# Patient Record
Sex: Female | Born: 1960
Health system: Southern US, Community
[De-identification: ages and names within clinical notes are randomized; demographics above are authoritative.]

## PROBLEM LIST (undated history)

## (undated) DIAGNOSIS — M1990 Unspecified osteoarthritis, unspecified site: Secondary | ICD-10-CM

## (undated) DIAGNOSIS — F1010 Alcohol abuse, uncomplicated: Secondary | ICD-10-CM

## (undated) DIAGNOSIS — E7800 Pure hypercholesterolemia, unspecified: Secondary | ICD-10-CM

## (undated) DIAGNOSIS — F32A Depression, unspecified: Secondary | ICD-10-CM

## (undated) DIAGNOSIS — G5600 Carpal tunnel syndrome, unspecified upper limb: Secondary | ICD-10-CM

## (undated) DIAGNOSIS — M797 Fibromyalgia: Secondary | ICD-10-CM

## (undated) DIAGNOSIS — J45909 Unspecified asthma, uncomplicated: Secondary | ICD-10-CM

## (undated) DIAGNOSIS — F329 Major depressive disorder, single episode, unspecified: Secondary | ICD-10-CM

## (undated) HISTORY — DX: Alcohol abuse, uncomplicated: F10.10

## (undated) HISTORY — DX: Fibromyalgia: M79.7

## (undated) HISTORY — PX: FOOT SURGERY: SHX648

## (undated) HISTORY — PX: COLONOSCOPY: SHX5424

## (undated) HISTORY — DX: Depression, unspecified: F32.A

## (undated) HISTORY — DX: Major depressive disorder, single episode, unspecified: F32.9

## (undated) HISTORY — DX: Carpal tunnel syndrome, unspecified upper limb: G56.00

## (undated) HISTORY — DX: Unspecified osteoarthritis, unspecified site: M19.90

## (undated) HISTORY — PX: KNEE SURGERY: SHX244

---

## 1999-01-02 ENCOUNTER — Emergency Department (HOSPITAL_COMMUNITY): Admission: EM | Admit: 1999-01-02 | Discharge: 1999-01-02 | Payer: Self-pay | Admitting: Emergency Medicine

## 1999-12-31 ENCOUNTER — Ambulatory Visit (HOSPITAL_COMMUNITY): Admission: RE | Admit: 1999-12-31 | Discharge: 1999-12-31 | Payer: Self-pay | Admitting: *Deleted

## 2000-06-09 ENCOUNTER — Ambulatory Visit (HOSPITAL_COMMUNITY): Admission: RE | Admit: 2000-06-09 | Discharge: 2000-06-09 | Payer: Self-pay | Admitting: *Deleted

## 2000-10-31 ENCOUNTER — Emergency Department (HOSPITAL_COMMUNITY): Admission: EM | Admit: 2000-10-31 | Discharge: 2000-10-31 | Payer: Self-pay | Admitting: Emergency Medicine

## 2000-10-31 ENCOUNTER — Encounter: Payer: Self-pay | Admitting: Emergency Medicine

## 2001-03-30 ENCOUNTER — Encounter: Payer: Self-pay | Admitting: Family Medicine

## 2001-03-30 ENCOUNTER — Encounter: Admission: RE | Admit: 2001-03-30 | Discharge: 2001-03-30 | Payer: Self-pay | Admitting: Family Medicine

## 2001-05-22 ENCOUNTER — Emergency Department (HOSPITAL_COMMUNITY): Admission: EM | Admit: 2001-05-22 | Discharge: 2001-05-22 | Payer: Self-pay | Admitting: Emergency Medicine

## 2002-03-11 ENCOUNTER — Emergency Department (HOSPITAL_COMMUNITY): Admission: EM | Admit: 2002-03-11 | Discharge: 2002-03-11 | Payer: Self-pay | Admitting: Emergency Medicine

## 2002-10-13 ENCOUNTER — Emergency Department (HOSPITAL_COMMUNITY): Admission: AD | Admit: 2002-10-13 | Discharge: 2002-10-13 | Payer: Self-pay | Admitting: Emergency Medicine

## 2002-10-13 ENCOUNTER — Encounter: Payer: Self-pay | Admitting: Emergency Medicine

## 2002-10-18 ENCOUNTER — Encounter: Admission: RE | Admit: 2002-10-18 | Discharge: 2002-10-18 | Payer: Self-pay | Admitting: Internal Medicine

## 2002-12-16 ENCOUNTER — Encounter: Admission: RE | Admit: 2002-12-16 | Discharge: 2002-12-16 | Payer: Self-pay | Admitting: Internal Medicine

## 2002-12-27 ENCOUNTER — Ambulatory Visit (HOSPITAL_COMMUNITY): Admission: RE | Admit: 2002-12-27 | Discharge: 2002-12-27 | Payer: Self-pay | Admitting: Internal Medicine

## 2003-01-03 ENCOUNTER — Encounter: Admission: RE | Admit: 2003-01-03 | Discharge: 2003-01-03 | Payer: Self-pay | Admitting: Internal Medicine

## 2003-01-03 ENCOUNTER — Encounter: Payer: Self-pay | Admitting: Internal Medicine

## 2003-02-16 ENCOUNTER — Encounter: Admission: RE | Admit: 2003-02-16 | Discharge: 2003-02-16 | Payer: Self-pay | Admitting: Internal Medicine

## 2003-02-18 ENCOUNTER — Emergency Department (HOSPITAL_COMMUNITY): Admission: EM | Admit: 2003-02-18 | Discharge: 2003-02-18 | Payer: Self-pay | Admitting: Emergency Medicine

## 2003-05-18 ENCOUNTER — Encounter: Admission: RE | Admit: 2003-05-18 | Discharge: 2003-05-18 | Payer: Self-pay | Admitting: Internal Medicine

## 2003-05-31 ENCOUNTER — Emergency Department (HOSPITAL_COMMUNITY): Admission: EM | Admit: 2003-05-31 | Discharge: 2003-05-31 | Payer: Self-pay | Admitting: Emergency Medicine

## 2003-07-27 ENCOUNTER — Encounter: Admission: RE | Admit: 2003-07-27 | Discharge: 2003-07-27 | Payer: Self-pay | Admitting: Internal Medicine

## 2003-08-24 ENCOUNTER — Emergency Department (HOSPITAL_COMMUNITY): Admission: AD | Admit: 2003-08-24 | Discharge: 2003-08-24 | Payer: Self-pay | Admitting: Family Medicine

## 2003-11-02 ENCOUNTER — Encounter: Admission: RE | Admit: 2003-11-02 | Discharge: 2003-11-02 | Payer: Self-pay | Admitting: Internal Medicine

## 2004-02-03 ENCOUNTER — Ambulatory Visit: Payer: Self-pay | Admitting: Internal Medicine

## 2004-02-09 ENCOUNTER — Ambulatory Visit (HOSPITAL_COMMUNITY): Admission: RE | Admit: 2004-02-09 | Discharge: 2004-02-09 | Payer: Self-pay | Admitting: Internal Medicine

## 2004-03-16 ENCOUNTER — Ambulatory Visit: Payer: Self-pay | Admitting: Internal Medicine

## 2004-08-03 ENCOUNTER — Ambulatory Visit: Payer: Self-pay | Admitting: Internal Medicine

## 2004-11-01 ENCOUNTER — Ambulatory Visit: Payer: Self-pay | Admitting: Internal Medicine

## 2004-12-06 ENCOUNTER — Ambulatory Visit: Payer: Self-pay | Admitting: Internal Medicine

## 2006-06-03 HISTORY — PX: ABDOMINAL HYSTERECTOMY: SHX81

## 2006-07-19 ENCOUNTER — Emergency Department (HOSPITAL_COMMUNITY): Admission: EM | Admit: 2006-07-19 | Discharge: 2006-07-20 | Payer: Self-pay | Admitting: Emergency Medicine

## 2006-11-06 ENCOUNTER — Encounter (INDEPENDENT_AMBULATORY_CARE_PROVIDER_SITE_OTHER): Payer: Self-pay | Admitting: *Deleted

## 2006-11-06 ENCOUNTER — Ambulatory Visit: Payer: Self-pay | Admitting: Internal Medicine

## 2006-11-06 DIAGNOSIS — N898 Other specified noninflammatory disorders of vagina: Secondary | ICD-10-CM | POA: Insufficient documentation

## 2006-11-06 DIAGNOSIS — F332 Major depressive disorder, recurrent severe without psychotic features: Secondary | ICD-10-CM | POA: Insufficient documentation

## 2006-11-06 DIAGNOSIS — F1010 Alcohol abuse, uncomplicated: Secondary | ICD-10-CM | POA: Insufficient documentation

## 2006-11-06 DIAGNOSIS — F329 Major depressive disorder, single episode, unspecified: Secondary | ICD-10-CM

## 2006-11-06 DIAGNOSIS — IMO0002 Reserved for concepts with insufficient information to code with codable children: Secondary | ICD-10-CM | POA: Insufficient documentation

## 2006-11-06 DIAGNOSIS — E785 Hyperlipidemia, unspecified: Secondary | ICD-10-CM | POA: Insufficient documentation

## 2006-11-06 DIAGNOSIS — F32A Depression, unspecified: Secondary | ICD-10-CM | POA: Insufficient documentation

## 2006-11-06 DIAGNOSIS — A64 Unspecified sexually transmitted disease: Secondary | ICD-10-CM | POA: Insufficient documentation

## 2006-11-06 DIAGNOSIS — IMO0001 Reserved for inherently not codable concepts without codable children: Secondary | ICD-10-CM | POA: Insufficient documentation

## 2006-11-06 DIAGNOSIS — D259 Leiomyoma of uterus, unspecified: Secondary | ICD-10-CM | POA: Insufficient documentation

## 2006-11-06 LAB — CONVERTED CEMR LAB: GC Probe Amp, Genital: NEGATIVE

## 2006-11-07 ENCOUNTER — Encounter (INDEPENDENT_AMBULATORY_CARE_PROVIDER_SITE_OTHER): Payer: Self-pay | Admitting: *Deleted

## 2006-11-07 LAB — CONVERTED CEMR LAB
Candida species: NEGATIVE
Gardnerella vaginalis: POSITIVE — AB
Trichomonal Vaginitis: NEGATIVE

## 2006-11-19 ENCOUNTER — Telehealth: Payer: Self-pay

## 2006-12-22 DIAGNOSIS — M25512 Pain in left shoulder: Secondary | ICD-10-CM | POA: Insufficient documentation

## 2007-01-22 ENCOUNTER — Ambulatory Visit (HOSPITAL_COMMUNITY): Admission: RE | Admit: 2007-01-22 | Discharge: 2007-01-22 | Payer: Self-pay | Admitting: Internal Medicine

## 2007-01-22 ENCOUNTER — Encounter (INDEPENDENT_AMBULATORY_CARE_PROVIDER_SITE_OTHER): Payer: Self-pay | Admitting: *Deleted

## 2007-01-22 ENCOUNTER — Ambulatory Visit: Payer: Self-pay | Admitting: *Deleted

## 2007-02-26 ENCOUNTER — Encounter (INDEPENDENT_AMBULATORY_CARE_PROVIDER_SITE_OTHER): Payer: Self-pay | Admitting: *Deleted

## 2007-02-26 ENCOUNTER — Ambulatory Visit: Payer: Self-pay | Admitting: Internal Medicine

## 2007-02-27 ENCOUNTER — Ambulatory Visit (HOSPITAL_COMMUNITY): Admission: RE | Admit: 2007-02-27 | Discharge: 2007-02-27 | Payer: Self-pay | Admitting: Internal Medicine

## 2007-03-18 ENCOUNTER — Encounter: Admission: RE | Admit: 2007-03-18 | Discharge: 2007-06-16 | Payer: Self-pay | Admitting: Internal Medicine

## 2007-03-18 ENCOUNTER — Encounter (INDEPENDENT_AMBULATORY_CARE_PROVIDER_SITE_OTHER): Payer: Self-pay | Admitting: *Deleted

## 2007-03-31 ENCOUNTER — Encounter (INDEPENDENT_AMBULATORY_CARE_PROVIDER_SITE_OTHER): Payer: Self-pay | Admitting: *Deleted

## 2007-04-02 ENCOUNTER — Ambulatory Visit: Payer: Self-pay | Admitting: Internal Medicine

## 2007-04-02 ENCOUNTER — Encounter (INDEPENDENT_AMBULATORY_CARE_PROVIDER_SITE_OTHER): Payer: Self-pay | Admitting: *Deleted

## 2007-04-02 LAB — CONVERTED CEMR LAB
BUN: 14 mg/dL (ref 6–23)
Calcium: 9.5 mg/dL (ref 8.4–10.5)
Glucose, Bld: 80 mg/dL (ref 70–99)
Sodium: 139 meq/L (ref 135–145)
TSH: 1.106 microintl units/mL (ref 0.350–5.50)

## 2007-04-08 ENCOUNTER — Encounter (INDEPENDENT_AMBULATORY_CARE_PROVIDER_SITE_OTHER): Payer: Self-pay | Admitting: *Deleted

## 2007-04-08 ENCOUNTER — Ambulatory Visit: Payer: Self-pay | Admitting: Internal Medicine

## 2007-04-08 LAB — CONVERTED CEMR LAB
Cholesterol: 265 mg/dL — ABNORMAL HIGH (ref 0–200)
Triglycerides: 109 mg/dL (ref <150)
VLDL: 22 mg/dL (ref 0–40)

## 2007-04-17 ENCOUNTER — Ambulatory Visit: Payer: Self-pay | Admitting: Internal Medicine

## 2007-05-12 ENCOUNTER — Encounter: Payer: Self-pay | Admitting: *Deleted

## 2007-05-18 ENCOUNTER — Encounter (INDEPENDENT_AMBULATORY_CARE_PROVIDER_SITE_OTHER): Payer: Self-pay | Admitting: *Deleted

## 2007-05-18 ENCOUNTER — Ambulatory Visit: Payer: Self-pay | Admitting: Internal Medicine

## 2007-05-18 LAB — CONVERTED CEMR LAB
Chlamydia, DNA Probe: NEGATIVE
GC Probe Amp, Genital: NEGATIVE

## 2007-05-19 ENCOUNTER — Encounter (INDEPENDENT_AMBULATORY_CARE_PROVIDER_SITE_OTHER): Payer: Self-pay | Admitting: *Deleted

## 2007-05-19 ENCOUNTER — Telehealth: Payer: Self-pay | Admitting: Licensed Clinical Social Worker

## 2007-05-21 LAB — CONVERTED CEMR LAB: Candida species: NEGATIVE

## 2007-06-10 ENCOUNTER — Encounter: Payer: Self-pay | Admitting: Licensed Clinical Social Worker

## 2007-06-12 ENCOUNTER — Encounter: Payer: Self-pay | Admitting: Licensed Clinical Social Worker

## 2007-06-16 ENCOUNTER — Encounter: Payer: Self-pay | Admitting: Licensed Clinical Social Worker

## 2007-06-26 ENCOUNTER — Telehealth: Payer: Self-pay | Admitting: Licensed Clinical Social Worker

## 2007-07-14 ENCOUNTER — Encounter (INDEPENDENT_AMBULATORY_CARE_PROVIDER_SITE_OTHER): Payer: Self-pay | Admitting: *Deleted

## 2007-07-14 ENCOUNTER — Encounter: Admission: RE | Admit: 2007-07-14 | Discharge: 2007-08-19 | Payer: Self-pay | Admitting: *Deleted

## 2007-07-27 ENCOUNTER — Encounter (INDEPENDENT_AMBULATORY_CARE_PROVIDER_SITE_OTHER): Payer: Self-pay | Admitting: *Deleted

## 2007-08-03 ENCOUNTER — Telehealth: Payer: Self-pay | Admitting: *Deleted

## 2007-09-21 ENCOUNTER — Ambulatory Visit: Payer: Self-pay | Admitting: Internal Medicine

## 2007-09-21 ENCOUNTER — Encounter (INDEPENDENT_AMBULATORY_CARE_PROVIDER_SITE_OTHER): Payer: Self-pay | Admitting: *Deleted

## 2007-09-21 LAB — CONVERTED CEMR LAB
Chlamydia, DNA Probe: NEGATIVE
GC Probe Amp, Genital: NEGATIVE

## 2007-09-22 LAB — CONVERTED CEMR LAB

## 2007-09-24 ENCOUNTER — Telehealth (INDEPENDENT_AMBULATORY_CARE_PROVIDER_SITE_OTHER): Payer: Self-pay | Admitting: *Deleted

## 2007-09-24 LAB — CONVERTED CEMR LAB
Candida species: POSITIVE — AB
Gardnerella vaginalis: POSITIVE — AB

## 2007-12-23 ENCOUNTER — Ambulatory Visit (HOSPITAL_COMMUNITY): Admission: RE | Admit: 2007-12-23 | Discharge: 2007-12-23 | Payer: Self-pay | Admitting: Internal Medicine

## 2007-12-23 ENCOUNTER — Encounter (INDEPENDENT_AMBULATORY_CARE_PROVIDER_SITE_OTHER): Payer: Self-pay | Admitting: Internal Medicine

## 2007-12-23 ENCOUNTER — Ambulatory Visit: Payer: Self-pay | Admitting: Internal Medicine

## 2007-12-23 DIAGNOSIS — M542 Cervicalgia: Secondary | ICD-10-CM | POA: Insufficient documentation

## 2007-12-23 LAB — CONVERTED CEMR LAB
ALT: 20 units/L (ref 0–35)
CO2: 20 meq/L (ref 19–32)
Calcium: 9.1 mg/dL (ref 8.4–10.5)
Sodium: 140 meq/L (ref 135–145)
Total Protein: 7.3 g/dL (ref 6.0–8.3)

## 2008-01-06 ENCOUNTER — Ambulatory Visit: Payer: Self-pay | Admitting: *Deleted

## 2008-01-06 ENCOUNTER — Ambulatory Visit (HOSPITAL_COMMUNITY): Admission: RE | Admit: 2008-01-06 | Discharge: 2008-01-06 | Payer: Self-pay | Admitting: Internal Medicine

## 2008-01-06 DIAGNOSIS — M25569 Pain in unspecified knee: Secondary | ICD-10-CM | POA: Insufficient documentation

## 2008-01-06 DIAGNOSIS — M25579 Pain in unspecified ankle and joints of unspecified foot: Secondary | ICD-10-CM | POA: Insufficient documentation

## 2008-01-11 ENCOUNTER — Ambulatory Visit (HOSPITAL_COMMUNITY): Admission: RE | Admit: 2008-01-11 | Discharge: 2008-01-11 | Payer: Self-pay | Admitting: Internal Medicine

## 2008-02-05 ENCOUNTER — Ambulatory Visit: Payer: Self-pay | Admitting: Internal Medicine

## 2008-02-05 ENCOUNTER — Encounter (INDEPENDENT_AMBULATORY_CARE_PROVIDER_SITE_OTHER): Payer: Self-pay | Admitting: Internal Medicine

## 2008-02-25 ENCOUNTER — Ambulatory Visit (HOSPITAL_COMMUNITY): Admission: RE | Admit: 2008-02-25 | Discharge: 2008-02-25 | Payer: Self-pay | Admitting: Internal Medicine

## 2008-03-16 ENCOUNTER — Encounter (INDEPENDENT_AMBULATORY_CARE_PROVIDER_SITE_OTHER): Payer: Self-pay | Admitting: Internal Medicine

## 2008-03-16 ENCOUNTER — Ambulatory Visit: Payer: Self-pay | Admitting: Infectious Disease

## 2008-03-16 DIAGNOSIS — M79609 Pain in unspecified limb: Secondary | ICD-10-CM | POA: Insufficient documentation

## 2008-03-16 LAB — CONVERTED CEMR LAB
ALT: 21 units/L (ref 0–35)
Alkaline Phosphatase: 88 units/L (ref 39–117)
BUN: 17 mg/dL (ref 6–23)
Basophils Absolute: 0 10*3/uL (ref 0.0–0.1)
Calcium: 9.9 mg/dL (ref 8.4–10.5)
Creatinine, Ser: 0.86 mg/dL (ref 0.40–1.20)
Eosinophils Absolute: 0.2 10*3/uL (ref 0.0–0.7)
Glucose, Bld: 92 mg/dL (ref 70–99)
LDL Cholesterol: 163 mg/dL — ABNORMAL HIGH (ref 0–99)
Lymphs Abs: 4 10*3/uL (ref 0.7–4.0)
MCHC: 33.2 g/dL (ref 30.0–36.0)
Monocytes Relative: 8 % (ref 3–12)
Neutrophils Relative %: 52 % (ref 43–77)
Potassium: 4.3 meq/L (ref 3.5–5.3)
RBC: 4.19 M/uL (ref 3.87–5.11)
RDW: 13.6 % (ref 11.5–15.5)
Sodium: 139 meq/L (ref 135–145)
Total Protein: 7.6 g/dL (ref 6.0–8.3)
VLDL: 41 mg/dL — ABNORMAL HIGH (ref 0–40)
WBC: 10.5 10*3/uL (ref 4.0–10.5)

## 2008-03-21 ENCOUNTER — Encounter (INDEPENDENT_AMBULATORY_CARE_PROVIDER_SITE_OTHER): Payer: Self-pay | Admitting: Internal Medicine

## 2008-04-01 ENCOUNTER — Telehealth (INDEPENDENT_AMBULATORY_CARE_PROVIDER_SITE_OTHER): Payer: Self-pay | Admitting: Internal Medicine

## 2008-04-20 ENCOUNTER — Ambulatory Visit: Payer: Self-pay | Admitting: Infectious Diseases

## 2008-04-20 ENCOUNTER — Encounter: Payer: Self-pay | Admitting: Internal Medicine

## 2008-04-20 DIAGNOSIS — R209 Unspecified disturbances of skin sensation: Secondary | ICD-10-CM | POA: Insufficient documentation

## 2008-04-20 LAB — CONVERTED CEMR LAB: Vitamin B-12: 442 pg/mL (ref 211–911)

## 2008-06-07 ENCOUNTER — Ambulatory Visit: Payer: Self-pay | Admitting: Infectious Disease

## 2008-06-07 ENCOUNTER — Encounter (INDEPENDENT_AMBULATORY_CARE_PROVIDER_SITE_OTHER): Payer: Self-pay | Admitting: Internal Medicine

## 2008-06-07 DIAGNOSIS — L84 Corns and callosities: Secondary | ICD-10-CM | POA: Insufficient documentation

## 2008-06-07 DIAGNOSIS — R519 Headache, unspecified: Secondary | ICD-10-CM | POA: Insufficient documentation

## 2008-06-07 DIAGNOSIS — R51 Headache: Secondary | ICD-10-CM | POA: Insufficient documentation

## 2008-06-07 HISTORY — DX: Corns and callosities: L84

## 2008-06-15 ENCOUNTER — Telehealth: Payer: Self-pay | Admitting: Internal Medicine

## 2008-07-24 ENCOUNTER — Emergency Department (HOSPITAL_COMMUNITY): Admission: EM | Admit: 2008-07-24 | Discharge: 2008-07-24 | Payer: Self-pay | Admitting: Emergency Medicine

## 2008-10-06 ENCOUNTER — Ambulatory Visit: Payer: Self-pay | Admitting: Internal Medicine

## 2008-11-10 ENCOUNTER — Emergency Department (HOSPITAL_COMMUNITY): Admission: EM | Admit: 2008-11-10 | Discharge: 2008-11-11 | Payer: Self-pay | Admitting: Emergency Medicine

## 2008-12-15 ENCOUNTER — Ambulatory Visit: Payer: Self-pay | Admitting: Internal Medicine

## 2008-12-15 ENCOUNTER — Encounter: Payer: Self-pay | Admitting: Internal Medicine

## 2008-12-15 DIAGNOSIS — R059 Cough, unspecified: Secondary | ICD-10-CM | POA: Insufficient documentation

## 2008-12-15 DIAGNOSIS — R05 Cough: Secondary | ICD-10-CM

## 2008-12-16 ENCOUNTER — Ambulatory Visit (HOSPITAL_COMMUNITY): Admission: RE | Admit: 2008-12-16 | Discharge: 2008-12-16 | Payer: Self-pay | Admitting: Internal Medicine

## 2008-12-16 LAB — CONVERTED CEMR LAB
Candida species: NEGATIVE
Gardnerella vaginalis: POSITIVE — AB

## 2009-01-02 ENCOUNTER — Encounter: Payer: Self-pay | Admitting: Internal Medicine

## 2009-01-09 ENCOUNTER — Encounter: Payer: Self-pay | Admitting: Internal Medicine

## 2009-01-26 ENCOUNTER — Ambulatory Visit: Payer: Self-pay | Admitting: Internal Medicine

## 2009-02-07 ENCOUNTER — Encounter: Payer: Self-pay | Admitting: Internal Medicine

## 2009-02-24 ENCOUNTER — Ambulatory Visit: Payer: Self-pay | Admitting: Infectious Diseases

## 2009-02-24 ENCOUNTER — Encounter: Payer: Self-pay | Admitting: Internal Medicine

## 2009-02-24 DIAGNOSIS — R079 Chest pain, unspecified: Secondary | ICD-10-CM | POA: Insufficient documentation

## 2009-03-17 ENCOUNTER — Ambulatory Visit: Payer: Self-pay | Admitting: Internal Medicine

## 2009-03-31 ENCOUNTER — Ambulatory Visit: Payer: Self-pay

## 2009-03-31 ENCOUNTER — Encounter: Payer: Self-pay | Admitting: Internal Medicine

## 2009-03-31 ENCOUNTER — Ambulatory Visit: Payer: Self-pay | Admitting: Cardiovascular Disease

## 2009-03-31 ENCOUNTER — Ambulatory Visit (HOSPITAL_COMMUNITY): Admission: RE | Admit: 2009-03-31 | Discharge: 2009-03-31 | Payer: Self-pay | Admitting: Internal Medicine

## 2009-04-14 ENCOUNTER — Ambulatory Visit: Payer: Self-pay | Admitting: Internal Medicine

## 2009-04-14 ENCOUNTER — Encounter: Payer: Self-pay | Admitting: Licensed Clinical Social Worker

## 2009-04-16 LAB — CONVERTED CEMR LAB
AST: 22 units/L (ref 0–37)
Albumin: 4.7 g/dL (ref 3.5–5.2)
Bilirubin, Direct: 0.1 mg/dL (ref 0.0–0.3)
HCT: 42.2 % (ref 36.0–46.0)
Indirect Bilirubin: 0.3 mg/dL (ref 0.0–0.9)
LDL Cholesterol: 160 mg/dL — ABNORMAL HIGH (ref 0–99)
Platelets: 369 10*3/uL (ref 150–400)
Total Bilirubin: 0.4 mg/dL (ref 0.3–1.2)
VLDL: 20 mg/dL (ref 0–40)
WBC: 11.6 10*3/uL — ABNORMAL HIGH (ref 4.0–10.5)

## 2009-05-01 ENCOUNTER — Encounter: Payer: Self-pay | Admitting: Internal Medicine

## 2009-05-02 ENCOUNTER — Ambulatory Visit (HOSPITAL_COMMUNITY): Admission: RE | Admit: 2009-05-02 | Discharge: 2009-05-02 | Payer: Self-pay | Admitting: Oncology

## 2009-05-15 ENCOUNTER — Ambulatory Visit: Payer: Self-pay | Admitting: Internal Medicine

## 2009-05-15 LAB — CONVERTED CEMR LAB
BUN: 15 mg/dL (ref 6–23)
CO2: 23 meq/L (ref 19–32)
Calcium: 9.8 mg/dL (ref 8.4–10.5)
Cholesterol: 222 mg/dL — ABNORMAL HIGH (ref 0–200)
Glucose, Bld: 101 mg/dL — ABNORMAL HIGH (ref 70–99)
HDL: 42 mg/dL (ref >39)
LDL Cholesterol: 159 mg/dL — ABNORMAL HIGH (ref 0–99)
Potassium: 4.3 meq/L (ref 3.5–5.3)
Total CHOL/HDL Ratio: 5.3
Triglycerides: 106 mg/dL (ref <150)
VLDL: 21 mg/dL (ref 0–40)

## 2009-06-12 ENCOUNTER — Encounter: Payer: Self-pay | Admitting: Internal Medicine

## 2009-06-13 ENCOUNTER — Telehealth: Payer: Self-pay | Admitting: Internal Medicine

## 2009-09-07 ENCOUNTER — Ambulatory Visit: Payer: Self-pay | Admitting: Internal Medicine

## 2009-10-31 ENCOUNTER — Ambulatory Visit: Payer: Self-pay | Admitting: Internal Medicine

## 2009-10-31 DIAGNOSIS — R350 Frequency of micturition: Secondary | ICD-10-CM | POA: Insufficient documentation

## 2009-11-01 ENCOUNTER — Ambulatory Visit: Payer: Self-pay | Admitting: Internal Medicine

## 2009-11-01 ENCOUNTER — Encounter (INDEPENDENT_AMBULATORY_CARE_PROVIDER_SITE_OTHER): Payer: Self-pay | Admitting: Internal Medicine

## 2009-11-01 LAB — CONVERTED CEMR LAB
Albumin: 4.8 g/dL (ref 3.5–5.2)
Alkaline Phosphatase: 71 units/L (ref 39–117)
Bilirubin Urine: NEGATIVE
Ketones, ur: NEGATIVE mg/dL
Leukocytes, UA: NEGATIVE
Nitrite: NEGATIVE
Potassium: 4.3 meq/L (ref 3.5–5.3)
Protein, ur: NEGATIVE mg/dL
Triglycerides: 110 mg/dL (ref <150)

## 2009-11-08 ENCOUNTER — Encounter: Payer: Self-pay | Admitting: Internal Medicine

## 2009-11-13 ENCOUNTER — Encounter: Payer: Self-pay | Admitting: Internal Medicine

## 2009-11-20 ENCOUNTER — Emergency Department (HOSPITAL_COMMUNITY): Admission: EM | Admit: 2009-11-20 | Discharge: 2009-11-20 | Payer: Self-pay | Admitting: Emergency Medicine

## 2009-12-01 ENCOUNTER — Telehealth: Payer: Self-pay | Admitting: Internal Medicine

## 2010-01-30 ENCOUNTER — Encounter: Payer: Self-pay | Admitting: Licensed Clinical Social Worker

## 2010-01-30 ENCOUNTER — Ambulatory Visit: Payer: Self-pay | Admitting: Internal Medicine

## 2010-03-08 ENCOUNTER — Encounter
Admission: RE | Admit: 2010-03-08 | Discharge: 2010-03-13 | Payer: Self-pay | Admitting: Physical Medicine & Rehabilitation

## 2010-03-13 ENCOUNTER — Encounter: Payer: Self-pay | Admitting: Internal Medicine

## 2010-03-13 ENCOUNTER — Ambulatory Visit: Payer: Self-pay | Admitting: Physical Medicine & Rehabilitation

## 2010-03-29 ENCOUNTER — Encounter: Payer: Self-pay | Admitting: Internal Medicine

## 2010-03-30 ENCOUNTER — Telehealth: Payer: Self-pay | Admitting: *Deleted

## 2010-04-30 ENCOUNTER — Ambulatory Visit: Payer: Self-pay | Admitting: Internal Medicine

## 2010-04-30 LAB — CONVERTED CEMR LAB
Bilirubin Urine: NEGATIVE
Glucose, Urine, Semiquant: NEGATIVE
Urobilinogen, UA: 0.2
pH: 5.5

## 2010-05-13 ENCOUNTER — Emergency Department (HOSPITAL_COMMUNITY)
Admission: EM | Admit: 2010-05-13 | Discharge: 2010-05-13 | Payer: Self-pay | Source: Home / Self Care | Admitting: Emergency Medicine

## 2010-06-05 ENCOUNTER — Ambulatory Visit (HOSPITAL_COMMUNITY)
Admission: RE | Admit: 2010-06-05 | Discharge: 2010-06-05 | Payer: Self-pay | Source: Home / Self Care | Attending: Internal Medicine | Admitting: Internal Medicine

## 2010-06-05 LAB — HM MAMMOGRAPHY

## 2010-06-13 ENCOUNTER — Ambulatory Visit: Admission: RE | Admit: 2010-06-13 | Discharge: 2010-06-13 | Payer: Self-pay | Source: Home / Self Care

## 2010-06-13 DIAGNOSIS — G5600 Carpal tunnel syndrome, unspecified upper limb: Secondary | ICD-10-CM | POA: Insufficient documentation

## 2010-06-13 DIAGNOSIS — L0390 Cellulitis, unspecified: Secondary | ICD-10-CM

## 2010-06-13 DIAGNOSIS — L0291 Cutaneous abscess, unspecified: Secondary | ICD-10-CM | POA: Insufficient documentation

## 2010-06-13 LAB — CONVERTED CEMR LAB
Bilirubin Urine: NEGATIVE
Casts: NONE SEEN /lpf
Crystals: NONE SEEN
Squamous Epithelial / LPF: NONE SEEN /lpf
Urine Glucose: NEGATIVE mg/dL
pH: 6 (ref 5.0–8.0)

## 2010-07-03 NOTE — Assessment & Plan Note (Signed)
Summary: Soc. Work   Social Work Evaluation Date  01/30/2010 Patient name [facility]  Primary MD   : Clerance Lav MD Social Worker's name : Dorothe Pea MSW- LCSW  Home [phone]    Cell phone: .  Marland Kitchen     Alternate phone: . Marland Kitchen        Primary Reason for Referral:     Emergency Housing/Public Housing/Section 8 Comments [facility].  Patient is known to social work.  She reported that she recd Disability in Nov of $674 per month and now has Medicaid and Medicare.  She also receives foodstamps of $41.  She is currently moving out of her mother's home into low income housing which will be about $360 per month.  She was supposed to move in on the 19th of August but that date has come and gone.  The patient is very stressed out about the uncertainty of her housing.  Action taken by Social Work: Called housing at [phone] and left message to advocate for patient.  The patient home phone is [phone].   SW to follow with housing and the patient.    Appended Document: Soc. Work The patient called on 8/30 to let us know that she would be moving into her housing on 9/1.

## 2010-07-03 NOTE — Miscellaneous (Signed)
[facility]   Imported By: Margie Billet 11/14/2009 14:42:00  _____________________________________________________________________  External Attachment:    Type:   Image     Comment:   External Document

## 2010-07-03 NOTE — Assessment & Plan Note (Signed)
Summary: BAD HEADACHES/SB.   Vital Signs:  Patient profile:   50 year old female Height:      65 inches (165.10 cm) Weight:      172.0 pounds (78.18 kg) BMI:     28.73 Temp:     97.7 degrees F (36.50 degrees C) oral Pulse rate:   59 / minute BP sitting:   113 / 69  (right arm) Cuff size:   regular  Vitals Entered By: Theotis Barrio NT II (April 30, 2010 11:07 AM)  CC: ON GOING HEADACHE FOR ABOUT A MONTH Is Patient Diabetic? No Pain Assessment Patient in pain? yes     Location: HEAD/NACK OF NECK Intensity:     7-8 Type: sharp Onset of pain  FOR ABOUT A MONTH Nutritional Status BMI of 25 - 29 = overweight  Have you ever been in a relationship where you felt threatened, hurt or afraid?No   Does patient need assistance? Functional Status Self care Ambulation Normal   Primary Care Jabria Loos:  Clerance Lav MD  CC:  ON GOING HEADACHE FOR ABOUT A MONTH.  History of Present Illness: This is a 50 year old female with hx of depression and hyperlipidemia who presents because of severe headaches x 1 month. Pt was initially having daily headaches for 1 week, but this has now improved somewhat and pt is having them every other day.  Headache left sided, starts retroorbital then spreads to the left side of her face, this occasionally generalizes more diffusely across the entire head though this is only during particularly bad headaches. Pain is sharp, throbbing, and rated 10/10 in intensity at its worse.  Pt has been taking excedrin which helps improve her symptoms after about an hour.   Pt has experienced both photo and phonophobia.  Pt denies having ever been diagnosed with migraines.  Initially, pt was seeing scintillating scotomas but this has not been occuring lately.  Pt occasionally feels light headed when leaning over and states that the room appears to be spining, though this tends to be assoicated with her headache. Pt denies any other neurologic symptoms including weakness,  slurred speach, numbness, change in her vision.   Pt is also complaining of a 1 month hx of dysuria. Pt feels she is having difficulty emptying her bladder, she also experiences burning with urination and some hesitancy.  Pt states that last week she also noted a small amount of pink on her toilet tissue.  Pt denis any fevers, chills, sob, chest pain, back pain or weight change.   Preventive Screening-Counseling & Management  Alcohol-Tobacco     Alcohol type: Beer AT TIMES     Smoking Status: quit     Year Quit: 2005  Caffeine-Diet-Exercise     Does Patient Exercise: yes     Type of exercise: WALKING     Times/week: <3  Allergies: No Known Drug Allergies  Past History:  Past Medical History: Last updated: 11/06/2006 Depression Fibromyalgia Ongoing ETOH abuse H/o STD :Trichomonas 2005  Family History: Reviewed history from 11/06/2006 and no changes required. Father: deceased,unknown medical history Mother: Alive, DM, HTN, CVA @age  36 Siblings: 2 Brothers and 2 sisters:one sister has thyroid problems              DM, HTN predominant  Social History: Reviewed history from 11/06/2006 and no changes required. Lives in [place] with her mother.Total of 6-7 ppl under one roof. Currently unemployed. Used to work at [employer] in [place]. She moved 5/08 to [place] ETOH  use: weekend, 1-2 beers from Th-Sun:used to drink everyday Former Smoker Current Marijuana use H/o Cocaine use: Quit since 2001  Review of Systems       Negative as per HPI.  Physical Exam  General:  alert and well-developed.   Head:  normocephalic and atraumatic.   Eyes:  vision grossly intact, pupils equal, pupils round, and pupils reactive to light.   Ears:  R ear normal and L ear normal.   Nose:  no external deformity and no nasal discharge.   Mouth:  pharynx pink and moist.   Neck:  supple and full ROM.   Lungs:  normal respiratory effort, normal breath sounds, no crackles, and no wheezes.     Heart:  normal rate, regular rhythm, no murmur, no gallop, and no rub.   Abdomen:  soft, non-tender, normal bowel sounds, and no distention.   Pulses:  2+ radial pulses Extremities:  No edema.  Neurologic:  alert & oriented X3, cranial nerves II-XII intact, strength normal in all extremities, and sensation intact to light touch.   Skin:  NO rashes.    Impression & Recommendations:  Problem # 1:  HEADACHE (ICD-784.0) At this point the DDX would include tension headache vs. magrane with the latter appearing more likely given the inital symptoms of scintillating scotoma associated unilateral headache and phono/photophobia.  The pts symptoms have been improving with the use of excedrin, however, I did wite her a prescription for sumatriptan today to see if it would help more.  I told the patient that if she was unable to afford the triptan that she could continue to use the OTC excedrin.  I did warn the pts about the possibility of chronic daily headache with extended daily use of excedrin and she understands the risks.  Given the pts complaint off occasional dizzyness I also performed orthostatic vital signs which were negative.  Problem # 2:  ? of ACUTE CYSTITIS (ICD-595.0)  Urine dip stick demonstrated small leukocytes that were nitrite and heme negative.  At this point I will check a urine culture and treate emperically for uncomplicated  acute cystitis with 3 days of cipro.   Her updated medication list for this problem includes:    Ciprofloxacin Hcl 250 Mg Tabs (Ciprofloxacin hcl) .Marland Kitchen... Take 1 tablet by mouth two times a day for 3 days.  Orders: T-Urinalysis Dipstick only (16109UE) T-Culture, Urine ([id number])  Her updated medication list for this problem includes:    Ciprofloxacin Hcl 250 Mg Tabs (Ciprofloxacin hcl) .Marland Kitchen... Take 1 tablet by mouth two times a day for 3 days.  Problem # 3:  PREVENTIVE HEALTH CARE (ICD-V70.0) Pt has already recieved a flu shot but wishes to get a Tdap   today.   Orders: Tdap => 24yrs IM (47829)  Complete Medication List: 1)  Seroquel Xr 300 Mg Xr24h-tab (Quetiapine fumarate) .... Take 1 tablet by mouth at bedtime 2)  Simvastatin 40 Mg Tabs (Simvastatin) .... Take 1 tablet by mouth once a day 3)  Zoloft 25 Mg Tabs (Sertraline hcl) .... Take 1 tablet by mouth once a day 4)  Hydroxyzine Hcl 25 Mg Tabs (Hydroxyzine hcl) .... Two times a day as needed 5)  Neurontin 100 Mg Caps (Gabapentin) .... Take 2 capsule three times a day. 6)  Meloxicam 7.5 Mg Tabs (Meloxicam) .... Take 1 pill by mouth daily as needed for pain. 7)  Sumatriptan Succinate 25 Mg Tabs (Sumatriptan succinate) .... Take on tab at the onset your your headahce. 8)  Ciprofloxacin  Hcl 250 Mg Tabs (Ciprofloxacin hcl) .... Take 1 tablet by mouth two times a day for 3 days.  Other Orders: Admin 1st Vaccine ([id number])  Patient Instructions: 1)  I am giving you an antibiotic for a probable bladder infection.  If you to have improvement in your symptoms please call the clinic for another appointment.  I am also giving you a medication for your headache.  If it helps and you wish to continue using it.  Call us for refills.  2)  Please schedule a follow-up appointment in 2 months. Prescriptions: CIPROFLOXACIN HCL 250 MG TABS (CIPROFLOXACIN HCL) Take 1 tablet by mouth two times a day for 3 days.  #6 x 0   Entered and Authorized by:   Sinda Du MD   Signed by:   Sinda Du MD on 04/30/2010   Method used:   Print then Give to Patient   RxID:   [id number] SUMATRIPTAN SUCCINATE 25 MG TABS (SUMATRIPTAN SUCCINATE) Take on tab at the onset your your headahce.  #2 x 3   Entered and Authorized by:   Sinda Du MD   Signed by:   Sinda Du MD on 04/30/2010   Method used:   Print then Give to Patient   RxID:   [id number]    Orders Added: 1)  T-Urinalysis Dipstick only [[id number]] 2)  T-Culture, Urine [[id number]] 3)  Tdap => 65yrs IM [[id number]] 4)  Est. Patient Level III  [42595] 5)  Admin 1st Vaccine [[id number]]   Tetanus Vaccine (to be given today)   Tetanus Vaccine (to be given today) Process Orders Check Orders Results:     [facility]: Check successful Tests Sent for requisitioning (April 30, 2010 1:05 PM):     04/30/2010: [facility] -- T-Culture, Urine [[id number]] (signed)     Prevention & Chronic Care Immunizations   Influenza vaccine: Fluvax MCR  (01/30/2010)   Influenza vaccine deferral: Deferred  (05/15/2009)    Tetanus booster: 04/30/2010: Tdap   Td booster deferral: Deferred  (04/14/2009)    Pneumococcal vaccine: Not documented   Pneumococcal vaccine deferral: Not indicated  (02/24/2009)  Other Screening   Pap smear: NEGATIVE FOR INTRAEPITHELIAL LESIONS OR MALIGNANCY.  (12/15/2008)   Pap smear action/deferral: Deferred  (01/30/2010)   Pap smear due: 10/2008    Mammogram: ASSESSMENT: Negative - BI-RADS 1^MM DIGITAL SCREENING  (05/02/2009)   Mammogram action/deferral: Ordered  (04/14/2009)   Mammogram due: 04/03/2010   Smoking status: quit  (04/30/2010)  Lipids   Total Cholesterol: 260  (10/31/2009)   Lipid panel action/deferral: Lipid Panel ordered   LDL: 195  (10/31/2009)   LDL Direct: Not documented   HDL: 43  (10/31/2009)   Triglycerides: 110  (10/31/2009)    SGOT (AST): 18  (10/31/2009)   BMP action: Ordered   SGPT (ALT): 21  (10/31/2009)   Alkaline phosphatase: 71  (10/31/2009)   Total bilirubin: 0.3  (10/31/2009)  Self-Management Support :   Personal Goals (by the next clinic visit) :      Personal LDL goal: 130  (12/15/2008)    Patient will work on the following items until the next clinic visit to reach self-care goals:     Medications and monitoring: take my medicines every day  (04/30/2010)     Eating: drink diet soda or water instead of juice or soda, eat more vegetables, use fresh or frozen vegetables, eat foods that are low in salt, eat baked foods instead of fried  foods, eat fruit for snacks and  desserts, limit or avoid alcohol  (04/30/2010)     Activity: take a 30 minute walk every day  (04/30/2010)    Lipid self-management support: Resources for patients handout  (04/30/2010)        Resource handout printed.   Nursing Instructions: Give tetanus booster today    Laboratory Results   Urine Tests  Date/Time Received: 11-28-011    12:04PM Date/Time Reported: 11-28-011     12:10PM  Routine Urinalysis   Color: yellow Appearance: Clear Glucose: negative   (Normal Range: Negative) Bilirubin: negative   (Normal Range: Negative) Ketone: trace (5)   (Normal Range: Negative) Spec. Gravity: 1.015   (Normal Range: 1.003-1.035) Blood: negative   (Normal Range: Negative) pH: 5.5   (Normal Range: 5.0-8.0) Protein: negative   (Normal Range: Negative) Urobilinogen: 0.2   (Normal Range: 0-1) Nitrite: negative   (Normal Range: Negative) Leukocyte Esterace: small   (Normal Range: Negative)    Comments: PRINT OUT GIVEN TO MD.

## 2010-07-03 NOTE — Consult Note (Signed)
Summary: [facility]   Imported By: Louretta Parma 04/20/2010 11:06:23  _____________________________________________________________________  External Attachment:    Type:   Image     Comment:   External Document

## 2010-07-03 NOTE — Assessment & Plan Note (Signed)
Summary: [facility]) FU/SB.   Vital Signs:  Patient profile:   50 year old female Height:      65 inches (165.10 cm) Weight:      175 pounds (81.77 kg) BMI:     30.05 Temp:     97.7 degrees F (36.50 degrees C) oral Pulse rate:   68 / minute BP sitting:   139 / 83  (right arm) Cuff size:   regular  Vitals Entered By: Theotis Barrio NT II (Oct 31, 2009 9:58 AM) CC: HAVING NUMBNESS AND TINGLING IN LEGS AND FEET /  NEEDS SOMETHING STRONGER FOR PAIN / PAIN MED REFILL/ ITCHES ON ARMS -SMALL BITES,  Is Patient Diabetic? No Pain Assessment Patient in pain? yes     Location: ALL OVER Intensity:         8 Type: NAGGING PAIN Onset of pain  ON GOING Nutritional Status BMI of > 30 = obese  Have you ever been in a relationship where you felt threatened, hurt or afraid?No   Does patient need assistance? Functional Status Self care Ambulation Normal Comments HAVING NUMBNESS AND TINGLING IN LEGS AND FEET / NEEDS SOMETHING STRONGER FOR PAIN/ PAIN MED REFILL/ ITCHES ON ARMS- SMALL BITES ON ARMS/    Primary Care Provider:  Clerance Lav MD  CC:  HAVING NUMBNESS AND TINGLING IN LEGS AND FEET /  NEEDS SOMETHING STRONGER FOR PAIN / PAIN MED REFILL/ ITCHES ON ARMS -SMALL BITES and .  History of Present Illness: Ms. Heinrich comes today as her pain is not responding with naproxen. Her pain is in her both arms, more on right arm and right leg. Her pain is improved when she lies down on her back. Her pain is agravates when she gets up.   She has urinary frequency, but has few drops whenever she goes to the bathroom. No dysuria. No fever or chills.     Depression History:      The patient denies a depressed mood most of the day and a diminished interest in her usual daily activities.         Current Medications (verified): 1)  Seroquel Xr 300 Mg Xr24h-Tab (Quetiapine Fumarate) .... Take 1 Tablet By Mouth At Bedtime 2)  Simvastatin 40 Mg Tabs (Simvastatin) .... Take 1 Tablet By Mouth Once A Day 3)   Zoloft 25 Mg Tabs (Sertraline Hcl) .... Take 1 Tablet By Mouth Once A Day 4)  Hydroxyzine Hcl 25 Mg Tabs (Hydroxyzine Hcl) .... Two Times A Day As Needed 5)  Neurontin 100 Mg Caps (Gabapentin) .... Take 1 Capsule Three Times A Day. 6)  Meloxicam 7.5 Mg Tabs (Meloxicam) .... Take 1 Pill By Mouth Daily As Needed For Pain.  Allergies: No Known Drug Allergies  Review of Systems      See HPI  Physical Exam  Mouth:  pharynx pink and moist.   Lungs:  normal breath sounds, no crackles, and no wheezes.   Heart:  normal rate, regular rhythm, no murmur, and no gallop.   Msk:  Right arm: ROM normal at shoulder and elbow joint. No rash, swelling, deformity. No tenderness.   R. leg: No redness, rash, swelling deformity or tenderness.  Neurologic:  alert & oriented X3.     Impression & Recommendations:  Problem # 1:  NUMBNESS, HAND (ICD-782.0) I will start her on low dose neurontin for both pain and numbness.   Problem # 2:  ARM PAIN, RIGHT (ICD-729.5) She states that naproxen did not help her pain. I  will start her on neurontin and as needed meloxicam. Will f/u.   Problem # 3:  URINARY FREQUENCY (ICD-788.41)  Will check UA and urine culture.   Orders: T-Urinalysis ([id number]) T-Culture, Urine ([id number])  Complete Medication List: 1)  Seroquel Xr 300 Mg Xr24h-tab (Quetiapine fumarate) .... Take 1 tablet by mouth at bedtime 2)  Simvastatin 40 Mg Tabs (Simvastatin) .... Take 1 tablet by mouth once a day 3)  Zoloft 25 Mg Tabs (Sertraline hcl) .... Take 1 tablet by mouth once a day 4)  Hydroxyzine Hcl 25 Mg Tabs (Hydroxyzine hcl) .... Two times a day as needed 5)  Neurontin 100 Mg Caps (Gabapentin) .... Take 1 capsule three times a day. 6)  Meloxicam 7.5 Mg Tabs (Meloxicam) .... Take 1 pill by mouth daily as needed for pain.  Other Orders: T-Lipid Profile ([id number]) T-Comprehensive Metabolic Panel [phone])  Patient Instructions: 1)  Please schedule a follow-up appointment  in 2 months. 2)  Limit your Sodium (Salt) to less than 2 grams a day(slightly less than 1/2 a teaspoon) to prevent fluid retention, swelling, or worsening of symptoms. 3)  It is important that you exercise regularly at least 20 minutes 5 times a week. If you develop chest pain, have severe difficulty breathing, or feel very tired , stop exercising immediately and seek medical attention. 4)  You need to lose weight. Consider a lower calorie diet and regular exercise.  5)  Check your Blood Pressure regularly. If it is above: you should make an appointment. Prescriptions: MELOXICAM 7.5 MG TABS (MELOXICAM) take 1 pill by mouth daily as needed for pain.  #30 x 0   Entered and Authorized by:   Jason Coop MD   Signed by:   Jason Coop MD on 10/31/2009   Method used:   Electronically to        [facility] [phone]* (retail)       [address]       [place], [place]  [address]       Ph: [phone]       Fax: [phone]   RxID:   [id number] NEURONTIN 100 MG CAPS (GABAPENTIN) take 1 capsule three times a day.  #90 x 0   Entered and Authorized by:   Jason Coop MD   Signed by:   Jason Coop MD on 10/31/2009   Method used:   Electronically to        [facility] [phone]* (retail)       [address]       [place], [place]  [address]       Ph: [phone]       Fax: [phone]   RxID:   [id number]  Process Orders Check Orders Results:     [facility]: Check successful Order queued for requisitioning for [facility]: Oct 31, 2009 12:12 PM  Tests Sent for requisitioning (Oct 31, 2009 12:12 PM):     10/31/2009: [facility] -- T-Lipid Profile [id number] (signed)     10/31/2009: [facility] -- T-Comprehensive Metabolic Panel [[id number]] (signed)     10/31/2009: [facility] -- T-Urinalysis [[id number]] (signed)     10/31/2009: [facility] -- T-Culture, Urine  [[id number]] (signed)    Prevention & Chronic Care Immunizations   Influenza vaccine: Fluvax 3+  (03/16/2008)   Influenza vaccine deferral: Deferred  (05/15/2009)    Tetanus booster: Not documented   Td booster deferral: Deferred  (04/14/2009)    Pneumococcal vaccine: Not documented  Pneumococcal vaccine deferral: Not indicated  (02/24/2009)  Other Screening   Pap smear: NEGATIVE FOR INTRAEPITHELIAL LESIONS OR MALIGNANCY.  (12/15/2008)   Pap smear action/deferral: Ordered  (12/15/2008)   Pap smear due: 10/2008    Mammogram: ASSESSMENT: Negative - BI-RADS 1^MM DIGITAL SCREENING  (05/02/2009)   Mammogram action/deferral: Ordered  (04/14/2009)   Mammogram due: 03/2009   Smoking status: quit  (09/07/2009)  Lipids   Total Cholesterol: 222  (05/15/2009)   Lipid panel action/deferral: Lipid Panel ordered   LDL: 159  (05/15/2009)   LDL Direct: Not documented   HDL: 42  (05/15/2009)   Triglycerides: 106  (05/15/2009)    SGOT (AST): 22  (04/15/2009)   BMP action: Ordered   SGPT (ALT): 25  (04/15/2009) CMP ordered    Alkaline phosphatase: 78  (04/15/2009)   Total bilirubin: 0.4  (04/15/2009)    Lipid flowsheet reviewed?: Yes   Progress toward LDL goal: Unchanged  Self-Management Support :   Personal Goals (by the next clinic visit) :      Personal LDL goal: 130  (12/15/2008)    Patient will work on the following items until the next clinic visit to reach self-care goals:     Medications and monitoring: take my medicines every day, bring all of my medications to every visit  (10/31/2009)     Eating: drink diet soda or water instead of juice or soda, eat more vegetables, use fresh or frozen vegetables, eat foods that are low in salt, eat baked foods instead of fried foods, eat fruit for snacks and desserts, limit or avoid alcohol  (10/31/2009)     Activity: take a 30 minute walk every day  (10/31/2009)    Lipid self-management support: Written self-care plan   (10/31/2009)   Lipid self-care plan printed.   Process Orders Check Orders Results:     [facility]: Check successful Order queued for requisitioning for [facility]: Oct 31, 2009 12:12 PM  Tests Sent for requisitioning (Oct 31, 2009 12:12 PM):     10/31/2009: [facility] -- T-Lipid Profile [id number] (signed)     10/31/2009: [facility] -- T-Comprehensive Metabolic Panel [[id number]] (signed)     10/31/2009: [facility] -- T-Urinalysis [[id number]] (signed)     10/31/2009: [facility] -- T-Culture, Urine [[id number]] (signed)

## 2010-07-03 NOTE — Assessment & Plan Note (Signed)
Summary: CHECKUP/SB.   Vital Signs:  Patient profile:   50 year old female Height:      65 inches (165.10 cm) Weight:      179.9 pounds (82.18 kg) BMI:     30.05 Temp:     97.0 degrees F (36.11 degrees C) oral Pulse rate:   63 / minute BP sitting:   113 / 80  (right arm) Cuff size:   regular  Vitals Entered By: Theotis Barrio NT II (September 07, 2009 10:27 AM) CC: BILATERAL ARM PAIN  --CHRONIC    / BACK PAIN  /  MEDICATION REFILL   /  FOLLOW UP APPT FROM LAST VISIT, Is Patient Diabetic? No Pain Assessment Patient in pain? yes     Location: ARMS/ BACK Intensity:     6-7 Type: NAGGING Onset of pain  Chronic Nutritional Status BMI of > 30 = obese  Have you ever been in a relationship where you felt threatened, hurt or afraid?No   Does patient need assistance? Functional Status Self care Ambulation Normal Comments BILATERAL ARM PAIN AND BACK PAIN / MEDICATION REFILL  / FOLLOW VISIT FROM LAST OFFICE VISIT   Primary Care Provider:  Clerance Lav MD  CC:  BILATERAL ARM PAIN  --CHRONIC    / BACK PAIN  /  MEDICATION REFILL   /  FOLLOW UP APPT FROM LAST VISIT and .  History of Present Illness: 50 yr old female with chronic pain issues presents for follow up and refill. She now has qualified for medicare and had a recent follow up with mental health. She is now on zoloft and the have requested that her tramadol be changed. She has no other complain.   Depression History:      The patient denies a depressed mood most of the day and a diminished interest in her usual daily activities.         Preventive Screening-Counseling & Management  Alcohol-Tobacco     Alcohol type: Beer AT TIMES     Smoking Status: quit     Year Quit: 2005  Caffeine-Diet-Exercise     Does Patient Exercise: yes     Type of exercise: WALKING     Times/week: <3  Current Medications (verified): 1)  Seroquel Xr 300 Mg Xr24h-Tab (Quetiapine Fumarate) .... Take 1 Tablet By Mouth At Bedtime 2)   Simvastatin 40 Mg Tabs (Simvastatin) .... Take 1 Tablet By Mouth Once A Day 3)  Tramadol Hcl 50 Mg Tabs (Tramadol Hcl) .... Take 1 Tablet By Mouth Two Times A Day 4)  Zoloft 25 Mg Tabs (Sertraline Hcl) .... Take 1 Tablet By Mouth Once A Day 5)  Hydroxyzine Hcl 25 Mg Tabs (Hydroxyzine Hcl) .... Two Times A Day As Needed  Allergies (verified): No Known Drug Allergies  Past History:  Past Medical History: Last updated: 23-Nov-2006 Depression Fibromyalgia Ongoing ETOH abuse H/o STD :Trichomonas 2005  Family History: Last updated: 11/23/06 Father: deceased,unknown medical history Mother: Alive, DM, HTN, CVA @age  43 Siblings: 2 Brothers and 2 sisters:one sister has thyroid problems              DM, HTN predominant  Social History: Last updated: 11-23-2006 Lives in [place] with her mother.Total of 6-7 ppl under one roof. Currently unemployed. Used to work at [employer] in [place]. She moved 5/08 to [facility] ETOH use: weekend, 1-2 beers from [employer]:used to drink everyday Former Smoker Current Marijuana use H/o Cocaine use: Quit since 2001  Risk Factors: Exercise: yes (09/07/2009)  Risk Factors: Smoking Status: quit (09/07/2009)  Review of Systems      See HPI  Physical Exam  General:  Well-developed,well-nourished,in no acute distress; alert,appropriate and cooperative throughout examination Head:  no abnormalities observed.   Eyes:  vision grossly intact, pupils equal, pupils round, and pupils reactive to light.   Mouth:  Oral mucosa and oropharynx without lesions or exudates.  Teeth in good repair. Neck:  No deformities, masses, posterior cervial tenderness noted. Lungs:  Normal respiratory effort, chest expands symmetrically. Lungs are clear to auscultation, no crackles or wheezes. Heart:  Normal rate and regular rhythm. S1 and S2 normal without gallop, murmur, click, rub or other extra sounds. Abdomen:  Bowel sounds positive,abdomen soft and non-tender without  masses, organomegaly or hernias noted. Neurologic:  No cranial nerve deficits noted. Station and gait are normal. Plantar reflexes are down-going bilaterally. DTRs are symmetrical throughout. Sensory, motor and coordinative functions appear intact. Skin:  turgor normal and color normal.   No edema, no induration, no pain. Psych:  Oriented X3, memory intact for recent and remote, and poor eye contact.     Impression & Recommendations:  Problem # 1:  ARM PAIN, RIGHT (ICD-729.5) stable. she has other chronic pain syndrome- multiple sites, joints through out the body.She was on tramadol for this. Now that she is on zoloft there is risk of serotonin syndrome. I will discontinue tramadol and put her on naproxen. In past she had stomach upset with ibuprofen.   Problem # 2:  HYPERLIPIDEMIA (ICD-272.4) Was not taking meds for some time. I will start her on simvastatin. will follow up with lipid profile on next visit.  Her updated medication list for this problem includes:    Simvastatin 40 Mg Tabs (Simvastatin) .Marland Kitchen... Take 1 tablet by mouth once a day  Labs Reviewed: SGOT: 22 (04/15/2009)   SGPT: 25 (04/15/2009)   HDL:42 (05/15/2009), 41 (04/15/2009)  LDL:159 (05/15/2009), 160 (04/15/2009)  Chol:222 (05/15/2009), 221 (04/15/2009)  Trig:106 (05/15/2009), 99 (04/15/2009)  Problem # 3:  ALCOHOL ABUSE (ICD-305.00) not drinking high amounts now. She says she drinks 1-2 drink per week. Once again, I warned her that there are multiple side effects and given that she is on multiple psych medicines, this combination can be fatal.   Problem # 4:  NUMBNESS, HAND (ICD-782.0) continues to have numbness off and on. No clear etiology. Strength is normal and sensation is normal on physical exam.   Problem # 5:  Hx of SEXUALLY TRANSMITTED DISEASE (ICD-099.9) Not having any discharge or symptoms of STD. Advised regardign safe sex and condom use.   Complete Medication List: 1)  Seroquel Xr 300 Mg Xr24h-tab  (Quetiapine fumarate) .... Take 1 tablet by mouth at bedtime 2)  Simvastatin 40 Mg Tabs (Simvastatin) .... Take 1 tablet by mouth once a day 3)  Zoloft 25 Mg Tabs (Sertraline hcl) .... Take 1 tablet by mouth once a day 4)  Hydroxyzine Hcl 25 Mg Tabs (Hydroxyzine hcl) .... Two times a day as needed 5)  Naproxen Sodium 550 Mg Tabs (Naproxen sodium) .... Three times a day  Patient Instructions: 1)  Please schedule a follow-up appointment in 3 months. Prescriptions: SIMVASTATIN 40 MG TABS (SIMVASTATIN) Take 1 tablet by mouth once a day  #30 x 3   Entered and Authorized by:   Clerance Lav MD   Signed by:   Clerance Lav MD on 09/07/2009   Method used:   Electronically to        [facility]  [address] (retail)       [address]       [place], [place]  [address]       Ph: [phone]       Fax: [phone]   RxID:   [id number] NAPROXEN SODIUM 550 MG TABS (NAPROXEN SODIUM) three times a day  #90 x 0   Entered and Authorized by:   Clerance Lav MD   Signed by:   Clerance Lav MD on 09/07/2009   Method used:   Electronically to        [facility] [phone]* (retail)       [address]       [place], [place]  [address]       Ph: [phone]       Fax: [phone]   RxID:   [id number]    Prevention & Chronic Care Immunizations   Influenza vaccine: Fluvax 3+  (03/16/2008)   Influenza vaccine deferral: Deferred  (05/15/2009)    Tetanus booster: Not documented   Td booster deferral: Deferred  (04/14/2009)    Pneumococcal vaccine: Not documented   Pneumococcal vaccine deferral: Not indicated  (02/24/2009)  Other Screening   Pap smear: NEGATIVE FOR INTRAEPITHELIAL LESIONS OR MALIGNANCY.  (12/15/2008)   Pap smear action/deferral: Ordered  (12/15/2008)   Pap smear due: 10/2008    Mammogram: ASSESSMENT: Negative - BI-RADS 1^MM DIGITAL SCREENING  (05/02/2009)   Mammogram action/deferral: Ordered  (04/14/2009)   Mammogram due: 03/2009   Smoking status: quit   (09/07/2009)  Lipids   Total Cholesterol: 222  (05/15/2009)   Lipid panel action/deferral: Lipid Panel ordered   LDL: 159  (05/15/2009)   LDL Direct: Not documented   HDL: 42  (05/15/2009)   Triglycerides: 106  (05/15/2009)    SGOT (AST): 22  (04/15/2009)   BMP action: Ordered   SGPT (ALT): 25  (04/15/2009)   Alkaline phosphatase: 78  (04/15/2009)   Total bilirubin: 0.4  (04/15/2009)  Self-Management Support :   Personal Goals (by the next clinic visit) :      Personal LDL goal: 130  (12/15/2008)    Patient will work on the following items until the next clinic visit to reach self-care goals:     Medications and monitoring: take my medicines every day, bring all of my medications to every visit  (09/07/2009)     Eating: eat foods that are low in salt, eat baked foods instead of fried foods, eat fruit for snacks and desserts, limit or avoid alcohol  (09/07/2009)     Activity: take a 30 minute walk every day  (09/07/2009)    Lipid self-management support: Resources for patients handout  (09/07/2009)        Resource handout printed.

## 2010-07-03 NOTE — Assessment & Plan Note (Signed)
Summary: EST-CK/FU/MEDS/CFB   Vital Signs:  Patient profile:   50 year old female Height:      65 inches (165.10 cm) Weight:      171.06 pounds (77.75 kg) BMI:     28.57 Temp:     97.5 degrees F (36.39 degrees C) oral Pulse rate:   64 / minute BP sitting:   117 / 75  (right arm) Cuff size:   regular  Vitals Entered By: Angelina Ok RN (January 30, 2010 8:51 AM) CC: Depression Is Patient Diabetic? No Pain Assessment Patient in pain? yes     Location: hands, fingers Intensity: 7 Type: aching, throbbing Onset of pain  Constant Nutritional Status BMI of 25 - 29 = overweight  Have you ever been in a relationship where you felt threatened, hurt or afraid?No   Does patient need assistance? Functional Status Self care Ambulation Normal Comments Problems with housing. Hand paiin.  Getting sore in different spots.  Not taking meds as she should.     Primary Care Provider:  Clerance Lav MD  CC:  Depression.  History of Present Illness: Ms. Knieriem comes today as her pain in right arm is getting worse. The pain is limited to first and second fingers. It is associated iwth tingling and numbness.  The pain is contstant with periodic worsening. She is also having weakness in the right arm and she is unable to grasp things. The symptoms are progressive. She can not button or unbotton using the hand. She denies any injury to the arm or fall.   Her shoulder and other pains are stable. She is taking all her medications as prescribed.    Depression History:      The patient is having a depressed mood most of the day and has a diminished interest in her usual daily activities.        The patient denies that she feels like life is not worth living, denies that she wishes that she were dead, and denies that she has thought about ending her life.        Comments:  Said no for this month.  On meds for.  Has not been taking well.   Preventive Screening-Counseling &  Management  Alcohol-Tobacco     Alcohol type: Beer AT TIMES     Smoking Status: quit     Year Quit: 2005  Current Medications (verified): 1)  Seroquel Xr 300 Mg Xr24h-Tab (Quetiapine Fumarate) .... Take 1 Tablet By Mouth At Bedtime 2)  Simvastatin 40 Mg Tabs (Simvastatin) .... Take 1 Tablet By Mouth Once A Day 3)  Zoloft 25 Mg Tabs (Sertraline Hcl) .... Take 1 Tablet By Mouth Once A Day 4)  Hydroxyzine Hcl 25 Mg Tabs (Hydroxyzine Hcl) .... Two Times A Day As Needed 5)  Neurontin 100 Mg Caps (Gabapentin) .... Take 2 Capsule Three Times A Day. 6)  Meloxicam 7.5 Mg Tabs (Meloxicam) .... Take 1 Pill By Mouth Daily As Needed For Pain.  Allergies (verified): No Known Drug Allergies  Past History:  Past Medical History: Last updated: 11/11/06 Depression Fibromyalgia Ongoing ETOH abuse H/o STD :Trichomonas 2005  Family History: Last updated: 2006/11/11 Father: deceased,unknown medical history Mother: Alive, DM, HTN, CVA @age  84 Siblings: 2 Brothers and 2 sisters:one sister has thyroid problems              DM, HTN predominant  Social History: Last updated: November 11, 2006 Lives in [place] with her mother.Total of 6-7 ppl under one roof. Currently unemployed. Used  to work at [employer] in [place]. She moved 5/08 to [facility] ETOH use: weekend, 1-2 beers from [employer]:used to drink everyday Former Smoker Current Marijuana use H/o Cocaine use: Quit since 2001  Risk Factors: Exercise: yes (09/07/2009)  Risk Factors: Smoking Status: quit (01/30/2010)  Review of Systems      See HPI  Physical Exam  General:  Well-developed,well-nourished,in no acute distress; alert,appropriate and cooperative throughout examination Head:  no abnormalities observed.   Eyes:  vision grossly intact, pupils equal, pupils round, and pupils reactive to light.   Ears:  R ear normal and L ear normal.   Nose:  External nasal examination shows no deformity or inflammation. Nasal mucosa are pink and  moist without lesions or exudates. Mouth:  pharynx pink and moist.   Neck:  No deformities, masses, posterior cervial tenderness noted. Lungs:  normal breath sounds, no crackles, and no wheezes.   Heart:  normal rate, regular rhythm, no murmur, and no gallop.   Abdomen:  Bowel sounds positive,abdomen soft and non-tender without masses, organomegaly or hernias noted. Msk:  Right arm: ROM normal at shoulder and elbow joint. No rash, swelling, deformity. No tenderness.   R. leg: No redness, rash, swelling deformity or tenderness.  Extremities:  interosseous weakness on right arm 1st and 2nd digits. painful flexion. Tinels sign is positive. Can recreate symptoms with ulnar nerve compression a the elbow as well as at wrist.  Neurologic:  alert & oriented X3.   Psych:  Oriented X3, memory intact for recent and remote, and poor eye contact.     Impression & Recommendations:  Problem # 1:  NUMBNESS, HAND (ICD-782.0) Her hand numbness has progressed and getting more symptomatic. It is most prominent on the right  ulnar area. see below for further information.   Problem # 2:  ULNAR NEUROPATHY (ICD-354.2) Based upon the exam and symptoms she experiences, I suspect she has ulnar neuropathy. which could be from compression or of other etiologies. She will need referral to the neurosurgeon as well as conduction study to further establish the etiology. I discussed other conservative approaches to help with symptoms.  Orders: Nerve Conduction (Nerve Conduction) Neurosurgeon Referral (Neurosurgeon)  Problem # 3:  FIBROMYALGIA (ICD-729.1) stable at present. she is on neurontin and seroquel.  Her updated medication list for this problem includes:    Meloxicam 7.5 Mg Tabs (Meloxicam) .Marland Kitchen... Take 1 pill by mouth daily as needed for pain.  Complete Medication List: 1)  Seroquel Xr 300 Mg Xr24h-tab (Quetiapine fumarate) .... Take 1 tablet by mouth at bedtime 2)  Simvastatin 40 Mg Tabs (Simvastatin) .... Take  1 tablet by mouth once a day 3)  Zoloft 25 Mg Tabs (Sertraline hcl) .... Take 1 tablet by mouth once a day 4)  Hydroxyzine Hcl 25 Mg Tabs (Hydroxyzine hcl) .... Two times a day as needed 5)  Neurontin 100 Mg Caps (Gabapentin) .... Take 2 capsule three times a day. 6)  Meloxicam 7.5 Mg Tabs (Meloxicam) .... Take 1 pill by mouth daily as needed for pain.  Other Orders: Influenza Vaccine MCR [id number]) Social Work Referral (Social )  Patient Instructions: 1)  Please schedule a follow-up appointment in 1 month. 2)  Do not flex your right arm at elbow for long period of time. 3)  Sleep with the towel wrapped around your right elbow. 4)  Do not do repetitive wrist movement.  Prescriptions: NEURONTIN 100 MG CAPS (GABAPENTIN) take 2 capsule three times a day.  #180 x 0   Entered  and Authorized by:   Clerance Lav MD   Signed by:   Clerance Lav MD on 01/30/2010   Method used:   Electronically to        [facility] [phone]* (retail)       [address]       [place], [place]  [address]       Ph: [phone]       Fax: [phone]   RxID:   [id number]   Prevention & Chronic Care Immunizations   Influenza vaccine: Fluvax MCR  (01/30/2010)   Influenza vaccine deferral: Deferred  (05/15/2009)    Tetanus booster: Not documented   Td booster deferral: Deferred  (04/14/2009)    Pneumococcal vaccine: Not documented   Pneumococcal vaccine deferral: Not indicated  (02/24/2009)  Other Screening   Pap smear: NEGATIVE FOR INTRAEPITHELIAL LESIONS OR MALIGNANCY.  (12/15/2008)   Pap smear action/deferral: Deferred  (01/30/2010)   Pap smear due: 10/2008    Mammogram: ASSESSMENT: Negative - BI-RADS 1^MM DIGITAL SCREENING  (05/02/2009)   Mammogram action/deferral: Ordered  (04/14/2009)   Mammogram due: 04/03/2010   Smoking status: quit  (01/30/2010)  Lipids   Total Cholesterol: 260  (10/31/2009)   Lipid panel action/deferral: Lipid Panel ordered   LDL: 195  (10/31/2009)   LDL  Direct: Not documented   HDL: 43  (10/31/2009)   Triglycerides: 110  (10/31/2009)    SGOT (AST): 18  (10/31/2009)   BMP action: Ordered   SGPT (ALT): 21  (10/31/2009)   Alkaline phosphatase: 71  (10/31/2009)   Total bilirubin: 0.3  (10/31/2009)    Lipid flowsheet reviewed?: Yes   Progress toward LDL goal: Unchanged  Self-Management Support :   Personal Goals (by the next clinic visit) :      Personal LDL goal: 130  (12/15/2008)    Patient will work on the following items until the next clinic visit to reach self-care goals:     Medications and monitoring: take my medicines every day, bring all of my medications to every visit  (01/30/2010)     Eating: drink diet soda or water instead of juice or soda, eat more vegetables, use fresh or frozen vegetables, eat foods that are low in salt, eat baked foods instead of fried foods, eat fruit for snacks and desserts, limit or avoid alcohol  (01/30/2010)     Activity: take a 30 minute walk every day  (01/30/2010)    Lipid self-management support: Written self-care plan, Education handout, Pre-printed educational material, Resources for patients handout  (01/30/2010)   Lipid self-care plan printed.   Lipid education handout printed      Resource handout printed.   Nursing Instructions: Give Flu vaccine today Give tetanus booster today      Vital Signs:  Patient profile:   50 year old female Height:      65 inches (165.10 cm) Weight:      171.06 pounds (77.75 kg) BMI:     28.57 Temp:     97.5 degrees F (36.39 degrees C) oral Pulse rate:   64 / minute BP sitting:   117 / 75  (right arm) Cuff size:   regular  Vitals Entered By: Angelina Ok RN (January 30, 2010 8:51 AM)    Immunizations Administered:  Influenza Vaccine # 1:    Vaccine Type: Fluvax MCR    Site: right deltoid    Mfr: GlaxoSmithKline    Dose: 0.5 ml    Route: IM    Given by: Venita Sheffield  Herbin RN    Exp. Date: 12/01/2010    Lot #: [id number]    VIS given:  12/25/06 version given January 30, 2010.  Flu Vaccine Consent Questions:    Do you have a history of severe allergic reactions to this vaccine? no    Any prior history of allergic reactions to egg and/or gelatin? no    Do you have a sensitivity to the preservative Thimersol? no    Do you have a past history of Guillan-Barre Syndrome? no    Do you currently have an acute febrile illness? no    Have you ever had a severe reaction to latex? no    Vaccine information given and explained to patient? yes    Are you currently pregnant? no

## 2010-07-03 NOTE — Progress Notes (Signed)
Summary: med refill/gp  Phone Note Refill Request Message from:  Fax from Pharmacy on June 13, 2009 9:37 AM  Refills Requested: Medication #1:  SIMVASTATIN 40 MG TABS Take 1 tablet by mouth once a day   Last Refilled: 04/17/2009  Method Requested: Fax to Local Pharmacy Initial call taken by: Chinita Pester RN,  June 13, 2009 9:37 AM  Follow-up for Phone Call        Refill approved-nurse to complete Follow-up by: Julaine Fusi  DO,  June 13, 2009 9:58 AM    Prescriptions: SIMVASTATIN 40 MG TABS (SIMVASTATIN) Take 1 tablet by mouth once a day  #30 x 0   Entered and Authorized by:   Julaine Fusi  DO   Signed by:   Julaine Fusi  DO on 06/13/2009   Method used:   Faxed to ...       [facility] (retail)       [address] [place], [place]  [address]       Ph: [phone]       Fax: [phone]   RxID:   [id number]

## 2010-07-03 NOTE — Progress Notes (Signed)
Summary: results/ hla  Phone Note Call from Patient   Summary of Call: pt calls and requests results of nerve conduction study done at dr Larna Daughters office, she is advised to call his office and request this, she is offered the ph# and address but states she knowsthe information and will call the office Initial call taken by: Marin Roberts RN,  March 30, 2010 2:52 PM

## 2010-07-03 NOTE — Progress Notes (Signed)
Summary: refill/gg  Phone Note Refill Request  on December 01, 2009 3:47 PM  Refills Requested: Medication #1:  NEURONTIN 100 MG CAPS take 1 capsule three times a day.   Last Refilled: 10/31/2009  Medication #2:  MELOXICAM 7.5 MG TABS take 1 pill by mouth daily as needed for pain..   Last Refilled: 10/31/2009  Method Requested: Electronic Initial call taken by: Merrie Roof RN,  December 01, 2009 3:48 PM  Follow-up for Phone Call        Rx written Follow-up by: Clerance Lav MD,  December 04, 2009 8:42 PM    Prescriptions: MELOXICAM 7.5 MG TABS (MELOXICAM) take 1 pill by mouth daily as needed for pain.  #30 x 3   Entered and Authorized by:   Clerance Lav MD   Signed by:   Clerance Lav MD on 12/04/2009   Method used:   Electronically to        [facility] [phone]* (retail)       [address]       [place], [place]  [address]       Ph: [phone]       Fax: [phone]   RxID:   [id number] NEURONTIN 100 MG CAPS (GABAPENTIN) take 1 capsule three times a day.  #90 x 3   Entered and Authorized by:   Clerance Lav MD   Signed by:   Clerance Lav MD on 12/04/2009   Method used:   Electronically to        [facility] [phone]* (retail)       [address]       [place], [place]  [address]       Ph: [phone]       Fax: [phone]   RxID:   [id number]

## 2010-07-03 NOTE — Miscellaneous (Signed)
Summary: [facility]  [facility]   Imported By: Florinda Marker 06/15/2009 13:47:17  _____________________________________________________________________  External Attachment:    Type:   Image     Comment:   External Document

## 2010-07-05 NOTE — Assessment & Plan Note (Signed)
Summary: ACUTE-BACK SPASMS(SHAH)/CFB   Vital Signs:  Patient profile:   50 year old female Height:      65 inches (165.10 cm) Weight:      176.5 pounds (80.23 kg) BMI:     29.48 Temp:     97.7 degrees F (36.50 degrees C) oral Pulse rate:   71 / minute BP sitting:   109 / 71  (left arm)  Vitals Entered By: Stanton Kidney Ditzler RN (June 13, 2010 10:09 AM) Is Patient Diabetic? No Pain Assessment Patient in pain? yes     Location: left upper  back and right 5th finger Intensity: 10 Type: aches Nutritional Status BMI of 25 - 29 = overweight Nutritional Status Detail appetite down  Have you ever been in a relationship where you felt threatened, hurt or afraid?denies   Does patient need assistance? Functional Status Self care Ambulation Normal Comments Discuss back and finger pain - appt with Dr Cephus Slater 06/14/10 , boils in groin area and lup on back area. Dr Odis Luster reqt right wrist splint - given.   Primary Care Provider:  Clerance Lav MD   History of Present Illness: 50yo W presents for evaluation of: 1. R median nerve compression. R hand tingling/numbness for past several months. Nerve conduction studies showed R median neuropathy at wrist. Referred to neurosurgeon (has appt to see him again tomorrow). Still having significant discomfort -- wants to know what can be done about it. Not using splint. Prescribed gabapentin and meloxican but neither helped.  2. Recurrent UTI? Treated for Enterobacter UTI back in Nov with 3 day course of cipro. Still having symptoms of dysuria. No fever/chills. 3. Skin abscess. Has painful nodule in R groin in skin fold. Says that she gets these periodically.  4. Headache. Continues to have occassional headaches. Sumatriptan prescribed at last visit helped -- would like a refill.   Depression History:      The patient denies a depressed mood most of the day and a diminished interest in her usual daily activities.         Preventive  Screening-Counseling & Management  Alcohol-Tobacco     Alcohol type: Beer AT TIMES     Smoking Status: quit     Year Quit: 2005  Caffeine-Diet-Exercise     Does Patient Exercise: yes     Type of exercise: WALKING     Times/week: <3  Comments: Does marijuana  Current Medications (verified): 1)  Seroquel Xr 300 Mg Xr24h-Tab (Quetiapine Fumarate) .... Take 1 Tablet By Mouth At Bedtime 2)  Simvastatin 40 Mg Tabs (Simvastatin) .... Take 1 Tablet By Mouth Once A Day 3)  Zoloft 25 Mg Tabs (Sertraline Hcl) .... Take 1 Tablet By Mouth Once A Day 4)  Hydroxyzine Hcl 25 Mg Tabs (Hydroxyzine Hcl) .... Two Times A Day As Needed 5)  Neurontin 100 Mg Caps (Gabapentin) .... Take 2 Capsule Three Times A Day. 6)  Meloxicam 7.5 Mg Tabs (Meloxicam) .... Take 1 Pill By Mouth Daily As Needed For Pain. 7)  Sumatriptan Succinate 25 Mg Tabs (Sumatriptan Succinate) .... Take On Tab At The Onset Your Your Headahce. 8)  Sulfamethoxazole-Tmp Ds 800-160 Mg Tabs (Sulfamethoxazole-Trimethoprim) .... Take 1 Tablet By Mouth Two Times A Day For 7 Days  Allergies (verified): No Known Drug Allergies  Past History:  Past Medical History: Last updated: 12/04/2006 Depression Fibromyalgia Ongoing ETOH abuse H/o STD :Trichomonas 2005  Family History: Last updated: 12-04-06 Father: deceased,unknown medical history Mother: Alive, DM, HTN, CVA @age  49 Siblings: 2  Brothers and 2 sisters:one sister has thyroid problems              DM, HTN predominant  Social History: Last updated: 11/06/2006 Lives in [place] with her mother.Total of 6-7 ppl under one roof. Currently unemployed. Used to work at [employer] in [place]. She moved 5/08 to [facility] ETOH use: weekend, 1-2 beers from [place]:used to drink everyday Former Smoker Current Marijuana use H/o Cocaine use: Quit since 2001  Review of Systems      See HPI General:  Denies chills and fever. CV:  Denies chest pain or discomfort. Resp:  Denies  shortness of breath. GI:  Denies abdominal pain and change in bowel habits. GU:  Complains of dysuria. Derm:  Complains of lesion(s). Neuro:  Complains of numbness and tingling. Psych:  Denies depression.  Physical Exam  General:  alert and cooperative to examination.   Head:  normocephalic and atraumatic.   Eyes:  vision grossly intact, pupils equal, pupils round, and pupils reactive to light.   Mouth:  pharynx pink and moist.   Neck:  supple and no masses.   Lungs:  normal breath sounds, no crackles, and no wheezes.   Heart:  normal rate, regular rhythm, no murmur, no gallop, and no rub.   Abdomen:  soft and non-tender.   Extremities:  No edema.  Neurologic:  alert & oriented X3, cranial nerves grossly intact, and strength 5/5 in lower extremities. hand grip strength slightly reduced on R. Positive Tinel sign on R. Intact sensation.   Skin:  turgor normal. 1-2cm firm, erythematous, tender nodule in R groin fold -- small amount of pink, purulent fluid expressed Inguinal Nodes:  no R inguinal adenopathy and no L inguinal adenopathy.   Psych:  Oriented X3, memory intact for recent and remote, normally interactive, good eye contact, not anxious appearing, and not depressed appearing.     Impression & Recommendations:  Problem # 1:  CARPAL TUNNEL SYNDROME (ICD-354.0) Physical exam findings (positive Tinel sign) and nerve conduction study showing R median neuropathy at wrist consistent with carpal tunnel syndrome. She has appointment to return to neurosurgery tomorrow; unsure if they manage carpal tunnel or if she will need to be seen by orthopedist. Note from neurosurgeon indicated that he would like to see her again and a quick call to office confirmed that intention. Advised patient to proceed with neurosurgery appointment but be advised that she may ultimately need to see orthopedist. Provided wrist splint, which should help relieve her symptoms.   Problem # 2:  ? of ACUTE CYSTITIS  (ICD-595.0) Given her symptoms of ongoing dysuria, will treat empirically with 7 day course of bactrim while awaiting urine culture results. Bactrim will also treat skin infection.   The following medications were removed from the medication list:    Ciprofloxacin Hcl 250 Mg Tabs (Ciprofloxacin hcl) .Marland Kitchen... Take 1 tablet by mouth two times a day for 3 days. Her updated medication list for this problem includes:    Sulfamethoxazole-tmp Ds 800-160 Mg Tabs (Sulfamethoxazole-trimethoprim) .Marland Kitchen... Take 1 tablet by mouth two times a day for 7 days  Orders: T-Culture, Urine ([id number]) T-Urinalysis ([id number])  Problem # 3:  ABSCESS, SKIN (ICD-682.9) Small skin abscess in R groin skin fold draining small amount of purulent fluid. Size is too small for I&D and culture would also probably be low yield secondary to size. May have originated as ingrown hair but difficult to determine at this point. Will treat with 7 day course of Bactrim, which should also  cover possible UTI.   The following medications were removed from the medication list:    Ciprofloxacin Hcl 250 Mg Tabs (Ciprofloxacin hcl) .Marland Kitchen... Take 1 tablet by mouth two times a day for 3 days. Her updated medication list for this problem includes:    Sulfamethoxazole-tmp Ds 800-160 Mg Tabs (Sulfamethoxazole-trimethoprim) .Marland Kitchen... Take 1 tablet by mouth two times a day for 7 days  Problem # 4:  HEADACHE (ICD-784.0) Improved with sumatriptan. Will refill.   Her updated medication list for this problem includes:    Meloxicam 7.5 Mg Tabs (Meloxicam) .Marland Kitchen... Take 1 pill by mouth daily as needed for pain.    Sumatriptan Succinate 25 Mg Tabs (Sumatriptan succinate) .Marland Kitchen... Take on tab at the onset your your headahce.  Complete Medication List: 1)  Seroquel Xr 300 Mg Xr24h-tab (Quetiapine fumarate) .... Take 1 tablet by mouth at bedtime 2)  Simvastatin 40 Mg Tabs (Simvastatin) .... Take 1 tablet by mouth once a day 3)  Zoloft 25 Mg Tabs (Sertraline hcl)  .... Take 1 tablet by mouth once a day 4)  Hydroxyzine Hcl 25 Mg Tabs (Hydroxyzine hcl) .... Two times a day as needed 5)  Neurontin 100 Mg Caps (Gabapentin) .... Take 2 capsule three times a day. 6)  Meloxicam 7.5 Mg Tabs (Meloxicam) .... Take 1 pill by mouth daily as needed for pain. 7)  Sumatriptan Succinate 25 Mg Tabs (Sumatriptan succinate) .... Take on tab at the onset your your headahce. 8)  Sulfamethoxazole-tmp Ds 800-160 Mg Tabs (Sulfamethoxazole-trimethoprim) .... Take 1 tablet by mouth two times a day for 7 days  Patient Instructions: 1)  Please schedule a follow-up appointment in 3 months. Prescriptions: SUMATRIPTAN SUCCINATE 25 MG TABS (SUMATRIPTAN SUCCINATE) Take on tab at the onset your your headahce.  #9 x 2   Entered and Authorized by:   Whitney Post MD   Signed by:   Whitney Post MD on 06/13/2010   Method used:   Electronically to        [facility] [phone]* (retail)       [address]       [place], [place]  [address]       Ph: [phone]       Fax: [phone]   RxID:   [id number] SULFAMETHOXAZOLE-TMP DS 800-160 MG TABS (SULFAMETHOXAZOLE-TRIMETHOPRIM) Take 1 tablet by mouth two times a day for 7 days  #14 x 0   Entered and Authorized by:   Whitney Post MD   Signed by:   Whitney Post MD on 06/13/2010   Method used:   Electronically to        [facility] [phone]* (retail)       [address] [place] [address]       [place], [place]  [address]       Ph: [phone]       Fax: [phone]   RxID:   [id number]    Orders Added: 1)  T-Culture, Urine [[id number]] 2)  T-Urinalysis [[id number]] 3)  Est. Patient Level IV [15176]   Process Orders Check Orders Results:     [facility]: Check successful Tests Sent for requisitioning (June 14, 2010 12:23 PM):     06/13/2010: [facility] -- T-Culture, Urine [[id number]] (signed)     06/13/2010: [facility] -- T-Urinalysis [[id number]]  (signed)     Prevention & Chronic Care Immunizations   Influenza vaccine: Fluvax MCR  (01/30/2010)   Influenza vaccine deferral: Deferred  (05/15/2009)    Tetanus booster: 04/30/2010: Tdap  Td booster deferral: Deferred  (04/14/2009)    Pneumococcal vaccine: Not documented   Pneumococcal vaccine deferral: Not indicated  (02/24/2009)  Other Screening   Pap smear: NEGATIVE FOR INTRAEPITHELIAL LESIONS OR MALIGNANCY.  (12/15/2008)   Pap smear action/deferral: Deferred  (01/30/2010)   Pap smear due: 10/2008    Mammogram: normal  (06/06/2010)   Mammogram action/deferral: Ordered  (04/14/2009)   Mammogram due: 04/03/2010   Smoking status: quit  (06/13/2010)  Lipids   Total Cholesterol: 260  (10/31/2009)   Lipid panel action/deferral: Lipid Panel ordered   LDL: 195  (10/31/2009)   LDL Direct: Not documented   HDL: 43  (10/31/2009)   Triglycerides: 110  (10/31/2009)    SGOT (AST): 18  (10/31/2009)   BMP action: Ordered   SGPT (ALT): 21  (10/31/2009)   Alkaline phosphatase: 71  (10/31/2009)   Total bilirubin: 0.3  (10/31/2009)    Lipid flowsheet reviewed?: Yes   Progress toward LDL goal: Unchanged  Self-Management Support :   Personal Goals (by the next clinic visit) :      Personal LDL goal: 130  (12/15/2008)    Patient will work on the following items until the next clinic visit to reach self-care goals:     Medications and monitoring: take my medicines every day, bring all of my medications to every visit  (06/13/2010)     Eating: eat more vegetables, use fresh or frozen vegetables, eat fruit for snacks and desserts  (06/13/2010)     Activity: take a 30 minute walk every day  (06/13/2010)    Lipid self-management support: Written self-care plan, Education handout, Resources for patients handout  (06/13/2010)   Lipid self-care plan printed.   Lipid education handout printed      Resource handout printed.   Process Orders Check Orders Results:     [facility]: Check successful Tests Sent for requisitioning (June 14, 2010 12:23 PM):     06/13/2010: [facility] -- T-Culture, Urine [[id number]] (signed)     06/13/2010: [facility] -- T-Urinalysis [[id number]] (signed)

## 2010-07-06 ENCOUNTER — Ambulatory Visit: Admit: 2010-07-06 | Payer: Self-pay | Admitting: Physical Medicine & Rehabilitation

## 2010-07-06 ENCOUNTER — Encounter: Payer: Self-pay | Admitting: Physical Medicine & Rehabilitation

## 2010-07-06 ENCOUNTER — Ambulatory Visit: Payer: Medicare Other | Attending: Internal Medicine

## 2010-08-13 LAB — DIFFERENTIAL
Basophils Absolute: 0 10*3/uL (ref 0.0–0.1)
Eosinophils Absolute: 0.2 10*3/uL (ref 0.0–0.7)
Lymphs Abs: 6.2 10*3/uL — ABNORMAL HIGH (ref 0.7–4.0)
Monocytes Absolute: 1 10*3/uL (ref 0.1–1.0)
Monocytes Relative: 8 % (ref 3–12)
Neutro Abs: 4.8 10*3/uL (ref 1.7–7.7)

## 2010-08-13 LAB — POCT I-STAT, CHEM 8
Calcium, Ion: 1.14 mmol/L (ref 1.12–1.32)
Chloride: 107 mEq/L (ref 96–112)
Glucose, Bld: 105 mg/dL — ABNORMAL HIGH (ref 70–99)
HCT: 38 % (ref 36.0–46.0)
TCO2: 26 mmol/L (ref 0–100)

## 2010-08-13 LAB — CBC
MCH: 30 pg (ref 26.0–34.0)
MCHC: 32.7 g/dL (ref 30.0–36.0)
MCV: 91.8 fL (ref 78.0–100.0)
Platelets: 333 10*3/uL (ref 150–400)
RDW: 13.1 % (ref 11.5–15.5)

## 2010-08-13 LAB — POCT CARDIAC MARKERS: Troponin i, poc: <0.05 ng/mL (ref 0.00–0.09)

## 2010-08-14 ENCOUNTER — Encounter: Payer: Medicare Other | Attending: Physical Medicine & Rehabilitation

## 2010-08-14 ENCOUNTER — Encounter: Payer: Medicare Other | Admitting: Physical Medicine & Rehabilitation

## 2010-08-19 LAB — URINALYSIS, ROUTINE W REFLEX MICROSCOPIC
Bilirubin Urine: NEGATIVE
Nitrite: NEGATIVE
Specific Gravity, Urine: 1.015 (ref 1.005–1.030)
pH: 6 (ref 5.0–8.0)

## 2010-08-19 LAB — DIFFERENTIAL
Lymphocytes Relative: 32 % (ref 12–46)
Monocytes Absolute: 0.6 10*3/uL (ref 0.1–1.0)
Monocytes Relative: 6 % (ref 3–12)
Neutro Abs: 6.1 10*3/uL (ref 1.7–7.7)

## 2010-08-19 LAB — CBC
Hemoglobin: 13.2 g/dL (ref 12.0–15.0)
RBC: 4.31 MIL/uL (ref 3.87–5.11)

## 2010-08-19 LAB — POCT I-STAT, CHEM 8
BUN: 7 mg/dL (ref 6–23)
Calcium, Ion: 1.14 mmol/L (ref 1.12–1.32)
Chloride: 106 mEq/L (ref 96–112)
Glucose, Bld: 108 mg/dL — ABNORMAL HIGH (ref 70–99)

## 2010-08-26 ENCOUNTER — Emergency Department (HOSPITAL_COMMUNITY)
Admission: EM | Admit: 2010-08-26 | Discharge: 2010-08-26 | Disposition: A | Discharge status: Home / Self Care | Payer: Medicare Other | Attending: Emergency Medicine | Admitting: Emergency Medicine

## 2010-08-26 DIAGNOSIS — R109 Unspecified abdominal pain: Secondary | ICD-10-CM | POA: Insufficient documentation

## 2010-08-26 DIAGNOSIS — A5903 Trichomonal cystitis and urethritis: Secondary | ICD-10-CM | POA: Insufficient documentation

## 2010-08-26 DIAGNOSIS — R10819 Abdominal tenderness, unspecified site: Secondary | ICD-10-CM | POA: Insufficient documentation

## 2010-08-26 DIAGNOSIS — R319 Hematuria, unspecified: Secondary | ICD-10-CM | POA: Insufficient documentation

## 2010-08-26 DIAGNOSIS — Z79899 Other long term (current) drug therapy: Secondary | ICD-10-CM | POA: Insufficient documentation

## 2010-08-26 DIAGNOSIS — F3130 Bipolar disorder, current episode depressed, mild or moderate severity, unspecified: Secondary | ICD-10-CM | POA: Insufficient documentation

## 2010-08-26 DIAGNOSIS — N390 Urinary tract infection, site not specified: Secondary | ICD-10-CM | POA: Insufficient documentation

## 2010-08-26 LAB — URINALYSIS, ROUTINE W REFLEX MICROSCOPIC
Bilirubin Urine: NEGATIVE
Glucose, UA: NEGATIVE mg/dL
Ketones, ur: NEGATIVE mg/dL
pH: 5 (ref 5.0–8.0)

## 2010-08-26 LAB — URINE MICROSCOPIC-ADD ON

## 2010-08-27 LAB — URINE CULTURE

## 2010-09-14 ENCOUNTER — Ambulatory Visit (INDEPENDENT_AMBULATORY_CARE_PROVIDER_SITE_OTHER): Payer: Medicare Other | Admitting: Internal Medicine

## 2010-09-14 ENCOUNTER — Encounter: Payer: Self-pay | Admitting: Internal Medicine

## 2010-09-14 DIAGNOSIS — F3289 Other specified depressive episodes: Secondary | ICD-10-CM

## 2010-09-14 DIAGNOSIS — F1010 Alcohol abuse, uncomplicated: Secondary | ICD-10-CM

## 2010-09-14 DIAGNOSIS — F419 Anxiety disorder, unspecified: Secondary | ICD-10-CM

## 2010-09-14 DIAGNOSIS — G5601 Carpal tunnel syndrome, right upper limb: Secondary | ICD-10-CM | POA: Insufficient documentation

## 2010-09-14 DIAGNOSIS — E785 Hyperlipidemia, unspecified: Secondary | ICD-10-CM

## 2010-09-14 DIAGNOSIS — F329 Major depressive disorder, single episode, unspecified: Secondary | ICD-10-CM

## 2010-09-14 DIAGNOSIS — F341 Dysthymic disorder: Secondary | ICD-10-CM

## 2010-09-14 DIAGNOSIS — G5600 Carpal tunnel syndrome, unspecified upper limb: Secondary | ICD-10-CM

## 2010-09-14 MED ORDER — SIMVASTATIN 40 MG PO TABS: 40.0000 mg | ORAL_TABLET | Freq: Every day | ORAL | Status: DC

## 2010-09-14 NOTE — Assessment & Plan Note (Signed)
Patient pain appears to have improved but she continues to have some tingling and numbness along with pain time to time. She is not using wrist splint at this time. She also has a history of rotator cuff tear. She does not have any followup with the pain clinic or her neurologist. I will refer her to orthopedic doctor for further consideration of carpal toe release surgery.

## 2010-09-14 NOTE — Assessment & Plan Note (Signed)
No taking medicine for last month as prescription was never received by pharmacy. Restart th med

## 2010-09-14 NOTE — Progress Notes (Signed)
  Subjective:    Patient ID: Theresa Kirk, female    DOB: 1960-06-11, 50 y.o.   MRN: [id number]  HPI Ms. Spells is here for followup. She is a history of fibromyalgia, right wrist carpal toe syndrome. She continues to have some pain but pain is overall improved. She reports that there was some confusion about her appointment with the pain clinic. She's fired from the pain clinic as she could not appear for a scheduled visit. She reports that she had called to her neurologist office and she did not have the pain clinic phone number. She was apparently scheduled 10 days later and was not aware of the same. As she did not appear for that scheduled visit she received a letter telling her that she can no longer go to the same thing clinic.  She also reports that she has not received her cholesterol medication from [facility]. This likely was due to electronic medical record transition.  She also reports that she was in emergency room for the treatment of urinary tract infection. She also was diagnosed with Trichomonas infection. She reports that she has been treated for the same and does not have any further symptoms.  Review of Systems  All other systems reviewed and are negative.       Objective:   Physical Exam BP 121/72  Pulse 65  Temp(Src) 96.9 F (36.1 C) (Oral)  Ht 5\' 5"  (1.651 m)  Wt 172 lb 11.2 oz (78.336 kg)  BMI 28.74 kg/m2  LMP 09/14/2006  General Appearance:    Alert, cooperative, no distress, appears stated age  Head:    Normocephalic, without obvious abnormality, atraumatic  Eyes:    PERRL, conjunctiva/corneas clear, EOM's intact, fundi    benign, both eyes  Ears:    Normal TM's and external ear canals, both ears  Nose:   Nares normal, septum midline, mucosa normal, no drainage    or sinus tenderness  Throat:   Lips, mucosa, and tongue normal; teeth and gums normal  Neck:   Supple, symmetrical, trachea midline, no adenopathy;    thyroid:  no  enlargement/tenderness/nodules; no carotid   bruit or JVD  Back:     Symmetric, no curvature, ROM normal, no CVA tenderness  Lungs:     Clear to auscultation bilaterally, respirations unlabored  Chest Wall:    No tenderness or deformity   Heart:    Regular rate and rhythm, S1 and S2 normal, no murmur, rub   or gallop  Breast Exam:    No tenderness, masses, or nipple abnormality  Abdomen:     Soft, non-tender, bowel sounds active all four quadrants,    no masses, no organomegaly  Extremities:   Right shoulder tenderness and some weekness in right wrist and thenar muscles.   Pulses:   2+ and symmetric all extremities  Skin:   Skin color, texture, turgor normal, no rashes or lesions  Lymph nodes:   Cervical, supraclavicular, and axillary nodes normal  Neurologic:   CNII-XII intact, normal strength, sensation and reflexes    throughout         Assessment & Plan:

## 2010-09-14 NOTE — Assessment & Plan Note (Signed)
Improved from before. Her psych meds are being tweaked for better mood but otherwise doing much better.

## 2010-09-14 NOTE — Assessment & Plan Note (Signed)
Says once a week at the most. Not a problem according to her.

## 2010-09-14 NOTE — Patient Instructions (Signed)
Follow up with your new pain doctor and orthopedic doctor. Return to clinic in three months.

## 2010-10-30 ENCOUNTER — Emergency Department (HOSPITAL_COMMUNITY)
Admission: EM | Admit: 2010-10-30 | Discharge: 2010-10-30 | Discharge status: Against Medical Advice | Payer: Medicare Other | Attending: Emergency Medicine | Admitting: Emergency Medicine

## 2010-10-30 DIAGNOSIS — R51 Headache: Secondary | ICD-10-CM | POA: Insufficient documentation

## 2010-11-15 ENCOUNTER — Ambulatory Visit (INDEPENDENT_AMBULATORY_CARE_PROVIDER_SITE_OTHER): Payer: Medicare Other | Admitting: Internal Medicine

## 2010-11-15 ENCOUNTER — Ambulatory Visit (HOSPITAL_COMMUNITY)
Admission: RE | Admit: 2010-11-15 | Discharge: 2010-11-15 | Disposition: A | Discharge status: Home / Self Care | Payer: Medicare Other | Source: Ambulatory Visit | Attending: Internal Medicine | Admitting: Internal Medicine

## 2010-11-15 ENCOUNTER — Encounter: Payer: Medicare Other | Admitting: Internal Medicine

## 2010-11-15 VITALS — BP 108/76 | HR 71 | Temp 97.7°F | Ht 65.0 in | Wt 176.3 lb

## 2010-11-15 DIAGNOSIS — R1084 Generalized abdominal pain: Secondary | ICD-10-CM

## 2010-11-15 DIAGNOSIS — F3289 Other specified depressive episodes: Secondary | ICD-10-CM

## 2010-11-15 DIAGNOSIS — F329 Major depressive disorder, single episode, unspecified: Secondary | ICD-10-CM

## 2010-11-15 DIAGNOSIS — G5600 Carpal tunnel syndrome, unspecified upper limb: Secondary | ICD-10-CM

## 2010-11-15 DIAGNOSIS — R109 Unspecified abdominal pain: Secondary | ICD-10-CM | POA: Insufficient documentation

## 2010-11-15 DIAGNOSIS — K219 Gastro-esophageal reflux disease without esophagitis: Secondary | ICD-10-CM | POA: Insufficient documentation

## 2010-11-15 MED ORDER — SENNOSIDES-DOCUSATE SODIUM 8.6-50 MG PO TABS: 1.0000 | ORAL_TABLET | Freq: Every day | ORAL | Status: AC | PRN

## 2010-11-15 NOTE — Progress Notes (Signed)
  Subjective:    Patient ID: Theresa Kirk, female    DOB: 1960-07-16, 50 y.o.   MRN: [id number]  HPI 50 years old female with PMH of fibromyalgia, alcohol abuse, depression, and carpel tunnel syndrome presents with ongoing abdominal pain. The pain is described as generalized. There is no known exacerbation triggered. Patient says the pain is intermittent. Pain is described as sharp. Patient reports that in the morning when she goes to urinate she feels like she cannot void. Patient is sexually active. Patient is on heparin he had a sexual behavior in recent times. Patient had hysterectomy in is not getting any periods.  Patient is feeling somewhat sick, but denies any fevers. She has nausea but not vomiting. Her appetite is okay. Abdominal pain is not related to food intake. There is no known relieving factors. Patient is not comfortable during this visit.   Review of Systems  All other systems reviewed and are negative.       Objective:   Physical Exam  Constitutional: She is oriented to person, place, and time. She appears well-developed and well-nourished.  HENT:  Head: Normocephalic and atraumatic.  Right Ear: External ear normal.  Left Ear: External ear normal.  Eyes: Conjunctivae and EOM are normal. Pupils are equal, round, and reactive to light.  Neck: No JVD present. No tracheal deviation present. No thyromegaly present.  Cardiovascular: Normal rate, regular rhythm and normal heart sounds.  Exam reveals no gallop.   No murmur heard. Pulmonary/Chest: No respiratory distress. She has no wheezes. She has no rales. She exhibits no tenderness.  Abdominal: Soft. Bowel sounds are normal. She exhibits no distension, no pulsatile liver, no ascites and no mass. There is no splenomegaly or hepatomegaly. There is tenderness. There is no rebound, no guarding, no CVA tenderness, no tenderness at McBurney's point and negative Murphy's sign. No hernia.    Musculoskeletal: Normal range of  motion. She exhibits no edema and no tenderness.  Lymphadenopathy:    She has no cervical adenopathy.  Neurological: She is alert and oriented to person, place, and time. She has normal reflexes. No cranial nerve deficit. Coordination normal.  Skin: No rash noted. No erythema.  Psychiatric: She has a normal mood and affect. Her behavior is normal. Thought content normal.          Assessment & Plan:

## 2010-11-15 NOTE — Assessment & Plan Note (Signed)
Symptoms are stable but still present. Patient is more concerned about her abdominal pain right now.

## 2010-11-15 NOTE — Patient Instructions (Signed)
Return in one week Doctor will call you if anything wrong with the lab results or abdomen xray Drink plenty of fluid Call us back or go to the Emergency room if having fevers, nausea and vomiting.

## 2010-11-15 NOTE — Assessment & Plan Note (Signed)
UA deep stick is negative for UTI. She denies any vaginal discharge or painful sexual intercourse. Her generalised abdominal pain could be of multiple etiology. She does seems to be constipated. I will get plain xray to r/o kidney stone as UA had trace blood in it. I also will get CBC, CMET, lipase, UDS for further work up. Patient to return to ED/our office if develop fevers. No other meds given. I did prescribe pericolace to relieve constipation.

## 2010-11-15 NOTE — Assessment & Plan Note (Signed)
>>  ASSESSMENT AND PLAN FOR ABDOMINAL PAIN WRITTEN ON 11/15/2010  2:29 PM BY SHAH, RIDDHISHKUMAR, MD  UA deep stick is negative for UTI. She denies any vaginal discharge or painful sexual intercourse. Her generalised abdominal pain could be of multiple etiology. She does seems to be constipated. I will get plain xray to r/o kidney stone as UA had trace blood in it. I also will get CBC, CMET, lipase, UDS for further work up. Patient to return to ED/our office if develop fevers. No other meds given. I did prescribe pericolace to relieve constipation.

## 2010-11-15 NOTE — Assessment & Plan Note (Signed)
Stable on her current meds. Mood is stable. No SI or HI.

## 2010-11-16 LAB — LIPASE: Lipase: 36 U/L (ref 0–75)

## 2010-11-16 LAB — COMPREHENSIVE METABOLIC PANEL
Albumin: 4.7 g/dL (ref 3.5–5.2)
BUN: 14 mg/dL (ref 6–23)
CO2: 26 mEq/L (ref 19–32)
Calcium: 9.4 mg/dL (ref 8.4–10.5)
Chloride: 103 mEq/L (ref 96–112)
Glucose, Bld: 80 mg/dL (ref 70–99)
Potassium: 4.3 mEq/L (ref 3.5–5.3)
Total Protein: 7.3 g/dL (ref 6.0–8.3)

## 2010-11-16 LAB — DRUGS OF ABUSE SCREEN W/O ALC, ROUTINE URINE
Benzodiazepines.: NEGATIVE
Creatinine,U: 174.5 mg/dL
Methadone: NEGATIVE
Phencyclidine (PCP): NEGATIVE
Propoxyphene: NEGATIVE

## 2010-11-16 LAB — CBC WITH DIFFERENTIAL/PLATELET
Basophils Relative: 0 % (ref 0–1)
HCT: 39.9 % (ref 36.0–46.0)
Hemoglobin: 13.3 g/dL (ref 12.0–15.0)
Lymphocytes Relative: 47 % — ABNORMAL HIGH (ref 12–46)
Lymphs Abs: 5.5 10*3/uL — ABNORMAL HIGH (ref 0.7–4.0)
MCHC: 33.3 g/dL (ref 30.0–36.0)
Monocytes Absolute: 0.8 10*3/uL (ref 0.1–1.0)
Monocytes Relative: 7 % (ref 3–12)
Neutro Abs: 5.2 10*3/uL (ref 1.7–7.7)
RBC: 4.31 MIL/uL (ref 3.87–5.11)

## 2010-11-22 ENCOUNTER — Ambulatory Visit (INDEPENDENT_AMBULATORY_CARE_PROVIDER_SITE_OTHER): Payer: Medicare Other | Admitting: Internal Medicine

## 2010-11-22 DIAGNOSIS — R1084 Generalized abdominal pain: Secondary | ICD-10-CM

## 2010-11-22 DIAGNOSIS — N289 Disorder of kidney and ureter, unspecified: Secondary | ICD-10-CM | POA: Insufficient documentation

## 2010-11-22 DIAGNOSIS — K5904 Chronic idiopathic constipation: Secondary | ICD-10-CM | POA: Insufficient documentation

## 2010-11-22 DIAGNOSIS — K5909 Other constipation: Secondary | ICD-10-CM

## 2010-11-22 MED ORDER — OMEPRAZOLE 40 MG PO CPDR: 40.0000 mg | DELAYED_RELEASE_CAPSULE | Freq: Every day | ORAL | Status: DC

## 2010-11-22 MED ORDER — POLYETHYLENE GLYCOL 3350 17 GM/SCOOP PO POWD: 17.0000 g | Freq: Every day | ORAL | Status: AC

## 2010-11-22 NOTE — Patient Instructions (Signed)
Return in one month Start taking reflux medicine and constipation medicine. Walk about 15 min a day, every day.

## 2010-11-22 NOTE — Progress Notes (Signed)
  Subjective:    Patient ID: Theresa Kirk, female    DOB: 01-21-1961, 50 y.o.   MRN: [id number]  Abdominal Pain   50 years old female with PMH of fibromyalgia, alcohol abuse, depression, and carpel tunnel syndrome presents with follow up of abdominal pain. She also had labwork which showed leucocytosis, elevated creatinine but KUB and lipase were negative. She is feeling better on her own.   She has no other complains and wants to review the results Review of Systems  Gastrointestinal: Positive for abdominal pain.  All other systems reviewed and are negative.       Objective:   Physical Exam  Constitutional: She is oriented to person, place, and time. She appears well-developed and well-nourished.  HENT:  Head: Normocephalic and atraumatic.  Right Ear: External ear normal.  Left Ear: External ear normal.  Eyes: Conjunctivae and EOM are normal. Pupils are equal, round, and reactive to light.  Neck: No JVD present. No tracheal deviation present. No thyromegaly present.  Cardiovascular: Normal rate, regular rhythm and normal heart sounds.  Exam reveals no gallop.   No murmur heard. Pulmonary/Chest: No respiratory distress. She has no wheezes. She has no rales. She exhibits no tenderness.  Abdominal: Soft. Bowel sounds are normal. She exhibits no distension, no pulsatile liver, no ascites and no mass. There is no splenomegaly or hepatomegaly. There is tenderness. There is no rebound, no guarding, no CVA tenderness, no tenderness at McBurney's point and negative Murphy's sign. No hernia.         Pain has decreased but still present  Musculoskeletal: Normal range of motion. She exhibits no edema and no tenderness.  Lymphadenopathy:    She has no cervical adenopathy.  Neurological: She is alert and oriented to person, place, and time. She has normal reflexes. No cranial nerve deficit. Coordination normal.  Skin: No rash noted. No erythema.  Psychiatric: She has a normal mood and  affect. Her behavior is normal. Thought content normal.          Assessment & Plan:

## 2010-11-22 NOTE — Assessment & Plan Note (Signed)
>>  ASSESSMENT AND PLAN FOR ABDOMINAL PAIN WRITTEN ON 11/22/2010  9:04 AM BY SHAH, RIDDHISHKUMAR, MD  No clear etiology known but could be GERD and constipation. Which can give leucocytosis. Will need follow up on kidney function, start her on omeprazole.

## 2010-11-22 NOTE — Assessment & Plan Note (Signed)
Creatinine bumps at times, going up to 1.2. Which I think is primarily from decreased intake. REpeat on next visit.

## 2010-11-22 NOTE — Assessment & Plan Note (Signed)
No clear etiology known but could be GERD and constipation. Which can give leucocytosis. Will need follow up on kidney function, start her on omeprazole.

## 2010-11-22 NOTE — Assessment & Plan Note (Signed)
Start her on miralax in addition to senokot. Regular exercise and plenty of fluids

## 2010-11-28 ENCOUNTER — Encounter: Payer: Self-pay | Admitting: Internal Medicine

## 2011-01-10 ENCOUNTER — Ambulatory Visit (INDEPENDENT_AMBULATORY_CARE_PROVIDER_SITE_OTHER): Payer: Medicare Other | Admitting: Internal Medicine

## 2011-01-10 ENCOUNTER — Encounter: Payer: Self-pay | Admitting: Internal Medicine

## 2011-01-10 VITALS — BP 117/80 | HR 75 | Temp 97.2°F | Ht 65.0 in | Wt 179.9 lb

## 2011-01-10 DIAGNOSIS — N390 Urinary tract infection, site not specified: Secondary | ICD-10-CM

## 2011-01-10 DIAGNOSIS — M79609 Pain in unspecified limb: Secondary | ICD-10-CM

## 2011-01-10 DIAGNOSIS — E785 Hyperlipidemia, unspecified: Secondary | ICD-10-CM

## 2011-01-10 DIAGNOSIS — N289 Disorder of kidney and ureter, unspecified: Secondary | ICD-10-CM

## 2011-01-10 DIAGNOSIS — N3090 Cystitis, unspecified without hematuria: Secondary | ICD-10-CM

## 2011-01-10 DIAGNOSIS — R1084 Generalized abdominal pain: Secondary | ICD-10-CM

## 2011-01-10 DIAGNOSIS — M25529 Pain in unspecified elbow: Secondary | ICD-10-CM

## 2011-01-10 LAB — POCT URINALYSIS DIPSTICK
Bilirubin, UA: NEGATIVE
Glucose, UA: NEGATIVE
Spec Grav, UA: 1.02
Urobilinogen, UA: 0.2
pH, UA: 5

## 2011-01-10 LAB — URINALYSIS, ROUTINE W REFLEX MICROSCOPIC
Bilirubin Urine: NEGATIVE
Glucose, UA: NEGATIVE mg/dL
Ketones, ur: NEGATIVE mg/dL
Specific Gravity, Urine: 1.016 (ref 1.005–1.030)
Urobilinogen, UA: 0.2 mg/dL (ref 0.0–1.0)
pH: 5.5 (ref 5.0–8.0)

## 2011-01-10 MED ORDER — CIPROFLOXACIN HCL 500 MG PO TABS: 250.0000 mg | ORAL_TABLET | Freq: Two times a day (BID) | ORAL | Status: AC

## 2011-01-10 MED ORDER — SIMVASTATIN 40 MG PO TABS: 40.0000 mg | ORAL_TABLET | Freq: Every day | ORAL | Status: DC

## 2011-01-10 MED ORDER — CIPROFLOXACIN HCL 500 MG PO TABS: 500.0000 mg | ORAL_TABLET | Freq: Two times a day (BID) | ORAL | Status: DC

## 2011-01-10 MED ORDER — MELOXICAM 7.5 MG PO TABS: 7.5000 mg | ORAL_TABLET | Freq: Every day | ORAL | Status: DC | PRN

## 2011-01-10 MED ORDER — DICLOFENAC SODIUM 1 % TD GEL: 1.0000 "application " | Freq: Four times a day (QID) | TRANSDERMAL | Status: DC

## 2011-01-10 NOTE — Patient Instructions (Signed)
Please take your medicines as prescribed. Please schedule a follow up appointment in 1 month or earlier if needed.

## 2011-01-10 NOTE — Assessment & Plan Note (Signed)
Will recheck her BMET today.

## 2011-01-10 NOTE — Progress Notes (Signed)
  Subjective:    Patient ID: Theresa Kirk, female    DOB: 03/06/1961, 50 y.o.   MRN: [id number]  HPI 50 year old woman with past medical history significant for hyperlipidemia, depression, fibromyalgia comes to the clinic for followup visit.  She complains of some abdominal pain that has been going on for last 1 month. She describes the pain as dull achy kind of discomfort, rates 5/10,  Points towards LUQ, that is constantly present. She has tried bowel regimen  That was prescribed to her with last clinic appointment but it doesn't help her. She complains of some intermittent burning today. She states that she  feels like going to the bathroom very often  but doesn't pee every time when she goes.  She was also complaining of some right arm pain that has been present for last one year. She has tried physical therapy and compresses without any help.    Review of Systems  Constitutional: Negative for fever, chills, activity change and appetite change.  HENT: Negative for nosebleeds, congestion, rhinorrhea, sneezing and postnasal drip.   Eyes: Negative for photophobia and visual disturbance.  Respiratory: Negative for apnea, cough, choking, chest tightness and shortness of breath.   Cardiovascular: Negative for chest pain, palpitations and leg swelling.  Gastrointestinal: Positive for abdominal pain and constipation. Negative for vomiting.  Genitourinary: Positive for dysuria and difficulty urinating.  Musculoskeletal: Positive for arthralgias.  Neurological: Negative for light-headedness and headaches.  Hematological: Negative for adenopathy.       Objective:   Physical Exam  Constitutional: She is oriented to person, place, and time. She appears well-developed and well-nourished. No distress.  HENT:  Head: Normocephalic and atraumatic.  Eyes: Conjunctivae and EOM are normal. Pupils are equal, round, and reactive to light.  Neck: Normal range of motion. Neck supple. No JVD present. No  tracheal deviation present. No thyromegaly present.  Cardiovascular: Normal rate, regular rhythm and intact distal pulses.  Exam reveals no gallop and no friction rub.   No murmur heard. Pulmonary/Chest: Effort normal and breath sounds normal. No stridor. No respiratory distress. She has no wheezes. She has no rales. She exhibits no tenderness.  Abdominal: Soft. Bowel sounds are normal. She exhibits no distension and no mass. There is tenderness. There is no rebound and no guarding.       Mild suprapubic tenderness  Musculoskeletal: Normal range of motion. She exhibits no edema and no tenderness.  Lymphadenopathy:    She has no cervical adenopathy.  Neurological: She is alert and oriented to person, place, and time. She has normal reflexes. She displays normal reflexes. No cranial nerve deficit. She exhibits normal muscle tone. Coordination normal.  Skin: Skin is warm. She is not diaphoretic.          Assessment & Plan:

## 2011-01-10 NOTE — Progress Notes (Signed)
Addended by: Bufford Spikes on: 01/10/2011 11:54 AM   Modules accepted: Orders

## 2011-01-10 NOTE — Assessment & Plan Note (Signed)
Patient complains of intermittent dysuria and  abdominal pain. On exam she was mildly tender to palpation in the suprapubic region. We did do a dipstick that was positive for leukocytes. This is most likely UTI . We'll get UA and urine culture. We'll treat her with 3 days of ciprofloxacin. Patient was advised to drink plenty of fluids.  I will call the patient if she needs to be on different antibiotics based on the results. She voices clear understanding.

## 2011-01-10 NOTE — Assessment & Plan Note (Signed)
She was complaining of right arm pain that has been going on for one year. On exam there was no obvious swelling or deformity. I think this is a part of her fibromyalgia. She was offered physical therapy but she's states that it makes it worse. We'll give her Voltaren gel for symptomatic treatment. Her prescription for meloxicam was refilled.

## 2011-01-10 NOTE — Assessment & Plan Note (Signed)
>>  ASSESSMENT AND PLAN FOR ABDOMINAL PAIN WRITTEN ON 01/10/2011  3:18 PM BY SAWHNEY, MEGHA, MD  She also complains of constant abdominal discomfort for last 1 month. Differentials include fibromyalgia versus UTI versus constipation versus GERD .  I think this is most likely a part of her fibromyalgia. When she follows up with her PCP during the next appointment we may consider evaluating her for fibromyalgia and  starting her on Gabapentin  or  Amitriptyline for fibromyalgia treatment.

## 2011-01-10 NOTE — Assessment & Plan Note (Addendum)
She also complains of constant abdominal discomfort for last 1 month. Differentials include fibromyalgia versus UTI versus constipation versus GERD .  I think this is most likely a part of her fibromyalgia. When she follows up with her PCP during the next appointment we may consider evaluating her for fibromyalgia and  starting her on Gabapentin  or  Amitriptyline for fibromyalgia treatment.

## 2011-01-11 LAB — URINALYSIS, MICROSCOPIC ONLY
Casts: NONE SEEN
Crystals: NONE SEEN

## 2011-01-11 LAB — URINE CULTURE: Colony Count: 6000

## 2011-01-11 LAB — BASIC METABOLIC PANEL WITH GFR
BUN: 18 mg/dL (ref 6–23)
Chloride: 105 mEq/L (ref 96–112)
Glucose, Bld: 72 mg/dL (ref 70–99)
Potassium: 4.2 mEq/L (ref 3.5–5.3)

## 2011-02-07 ENCOUNTER — Emergency Department (HOSPITAL_COMMUNITY)
Admission: EM | Admit: 2011-02-07 | Discharge: 2011-02-07 | Disposition: A | Discharge status: Home / Self Care | Payer: Medicare Other | Attending: Emergency Medicine | Admitting: Emergency Medicine

## 2011-02-07 DIAGNOSIS — R300 Dysuria: Secondary | ICD-10-CM | POA: Insufficient documentation

## 2011-02-07 DIAGNOSIS — N390 Urinary tract infection, site not specified: Secondary | ICD-10-CM | POA: Insufficient documentation

## 2011-02-07 DIAGNOSIS — R319 Hematuria, unspecified: Secondary | ICD-10-CM | POA: Insufficient documentation

## 2011-02-07 DIAGNOSIS — R10819 Abdominal tenderness, unspecified site: Secondary | ICD-10-CM | POA: Insufficient documentation

## 2011-02-07 DIAGNOSIS — E789 Disorder of lipoprotein metabolism, unspecified: Secondary | ICD-10-CM | POA: Insufficient documentation

## 2011-02-07 DIAGNOSIS — Z79899 Other long term (current) drug therapy: Secondary | ICD-10-CM | POA: Insufficient documentation

## 2011-02-07 DIAGNOSIS — F3130 Bipolar disorder, current episode depressed, mild or moderate severity, unspecified: Secondary | ICD-10-CM | POA: Insufficient documentation

## 2011-02-07 LAB — URINALYSIS, ROUTINE W REFLEX MICROSCOPIC
Glucose, UA: NEGATIVE mg/dL
Ketones, ur: NEGATIVE mg/dL
Protein, ur: NEGATIVE mg/dL
pH: 5 (ref 5.0–8.0)

## 2011-02-07 LAB — URINE MICROSCOPIC-ADD ON

## 2011-02-08 LAB — URINE CULTURE: Culture  Setup Time: 201209061149

## 2011-02-13 ENCOUNTER — Ambulatory Visit (INDEPENDENT_AMBULATORY_CARE_PROVIDER_SITE_OTHER): Payer: Medicare Other | Admitting: Internal Medicine

## 2011-02-13 ENCOUNTER — Encounter: Payer: Self-pay | Admitting: Internal Medicine

## 2011-02-13 DIAGNOSIS — N3090 Cystitis, unspecified without hematuria: Secondary | ICD-10-CM

## 2011-02-13 DIAGNOSIS — E785 Hyperlipidemia, unspecified: Secondary | ICD-10-CM

## 2011-02-13 NOTE — Patient Instructions (Signed)
Come back some morning before breakfast and have your blood drawn.  If there is anything we need to follow up on I will call you.  Continue all your other medications as prescribed.    Follow up in about 2-3 months to see how things are going.

## 2011-02-13 NOTE — Progress Notes (Signed)
Subjective:   Patient ID: Theresa Kirk female   DOB: July 09, 1960 50 y.o.   MRN: [id number]  HPI: [name] is a 50 y.o. woman who presents to clinic today for follow up from her ED visit.  She was evaluated because of dysuria.  She was diagnosed with a UTI, given macrobid, pyridium, and has been feeling better.  She states that when she has the dysuria it is usually right as she starts to urinate and again right as she finishes.  She has noticed that her urine is much darker then before.  She states she doesn't feel that she drinks enough water.  She denies fever, chills, increased frequency, or urgency.    She has been taking simvastatin for over a year and has not had a repeat FLP or Cmet since starting the medication.  She denies any muscle aches or muscle weakness.    Past Medical History  Diagnosis Date  . Depression   . Fibromyalgia   . Carpal tunnel syndrome   . Alcohol abuse    Current Outpatient Prescriptions  Medication Sig Dispense Refill  . diclofenac sodium (VOLTAREN) 1 % GEL Apply 1 application topically 4 (four) times daily.  1 Tube  1  . hydrOXYzine (VISTARIL) 50 MG capsule Take 50 mg by mouth 3 (three) times daily as needed.        . meloxicam (MOBIC) 7.5 MG tablet Take 1 tablet (7.5 mg total) by mouth daily as needed.  30 tablet  1  . omeprazole (PRILOSEC) 40 MG capsule Take 1 capsule (40 mg total) by mouth daily.  30 capsule  1  . QUEtiapine (SEROQUEL XR) 200 MG 24 hr tablet Take 200 mg by mouth at bedtime.        . senna-docusate (SENOKOT-S) 8.6-50 MG per tablet Take 1 tablet by mouth daily as needed for constipation.  30 tablet  1  . sertraline (ZOLOFT) 25 MG tablet Take 50 mg by mouth daily.        . simvastatin (ZOCOR) 40 MG tablet Take 1 tablet (40 mg total) by mouth at bedtime.  30 tablet  3  . SUMAtriptan (IMITREX) 25 MG tablet Take 25 mg by mouth every 2 (two) hours as needed.         Family History  Problem Relation Age of Onset  . Diabetes Mother    . Hypertension Mother   . Stroke Mother   . Diabetes Sister   . Thyroid disease Sister   . Hypertension Sister    History   Social History  . Marital Status: Single    Spouse Name: N/A    Number of Children: N/A  . Years of Education: N/A   Social History Main Topics  . Smoking status: Former Smoker    Quit date: 06/03/2000  . Smokeless tobacco: None  . Alcohol Use: No  . Drug Use: Yes    Special: Marijuana  . Sexually Active: Yes -- Female partner(s)   Other Topics Concern  . None   Social History Narrative   Lives with mother in [place].Currently unemployed.Used to work at [employer] in [place].ETOH use: weekend, 1-2 beers from [employer]: used to drink everydayFormer smokerCurrent THC useH/o cocaine use: quit in 2001.   Review of Systems: Constitutional: Denies fever, chills, diaphoresis, appetite change and fatigue.  HEENT: Denies photophobia, eye pain, redness, hearing loss, ear pain, congestion, sore throat, rhinorrhea, sneezing, mouth sores, trouble swallowing, neck pain, neck stiffness and tinnitus.   Respiratory: Denies SOB, DOE, cough,  chest tightness,  and wheezing.   Cardiovascular: Denies chest pain, palpitations and leg swelling.  Gastrointestinal: Denies nausea, vomiting, abdominal pain, diarrhea, constipation, blood in stool and abdominal distention.  Genitourinary: Denies dysuria, urgency, frequency, hematuria, flank pain and difficulty urinating.  Musculoskeletal: Denies myalgias, back pain, joint swelling, arthralgias and gait problem.  Skin: Denies pallor, rash and wound.  Neurological: Denies dizziness, seizures, syncope, weakness, light-headedness, numbness and headaches.  Hematological: Denies adenopathy. Easy bruising, personal or family bleeding history  Psychiatric/Behavioral: Denies suicidal ideation, mood changes, confusion, nervousness, sleep disturbance and agitation  Objective:  Physical Exam: Filed Vitals:   02/13/11 0849  BP: 116/76    Pulse: 65  Temp: 97.4 F (36.3 C)  TempSrc: Oral  Height: 5\' 5"  (1.651 m)  Weight: 178 lb 4.8 oz (80.876 kg)   Constitutional: Vital signs reviewed.  Patient is a well-developed and well-nourished woman in no acute distress and cooperative with exam. Alert and oriented x3.  Head: Normocephalic and atraumatic Ear: TM normal bilaterally Mouth: no erythema or exudates, MMM Eyes: PERRL, EOMI, conjunctivae normal, No scleral icterus.  Neck: Supple, Trachea midline normal ROM, No JVD, mass, thyromegaly, or carotid bruit present.  Cardiovascular: RRR, S1 normal, S2 normal, no MRG, pulses symmetric and intact bilaterally Pulmonary/Chest: CTAB, no wheezes, rales, or rhonchi Abdominal: Soft. Non-tender, non-distended, bowel sounds are normal, no masses, organomegaly, or guarding present.  GU: no CVA tenderness Musculoskeletal: No joint deformities, erythema, or stiffness, ROM full and no nontender Hematology: no cervical, inginal, or axillary adenopathy.  Neurological: A&O x3, Strength is normal and symmetric bilaterally, cranial nerve II-XII are grossly intact, no focal motor deficit, sensory intact to light touch bilaterally.  Skin: Warm, dry and intact. No rash, cyanosis, or clubbing.  Psychiatric: Normal mood and affect. speech and behavior is normal. Judgment and thought content normal. Cognition and memory are normal.   Assessment & Plan:

## 2011-02-18 NOTE — Assessment & Plan Note (Signed)
Her UA microscopy showed many squams and many bacteria which means it was not a clean catch.  It was not cultured.  She likely has not been drinking enough fluids and her dysuria is secondary to urine concentration.  She was encouraged to drink plenty of fluids daily and see if that helps relieve her symptoms.

## 2011-02-18 NOTE — Assessment & Plan Note (Signed)
Lab Results  Component Value Date   CHOL 260* 10/31/2009   CHOL 222* 05/15/2009   CHOL 221* 04/15/2009   Lab Results  Component Value Date   HDL 43 10/31/2009   HDL 42 16/03/9603   HDL 41 04/15/2009   Lab Results  Component Value Date   LDLCALC 195* 10/31/2009   LDLCALC 159* 05/15/2009   LDLCALC 160* 04/15/2009   Lab Results  Component Value Date   TRIG 110 10/31/2009   TRIG 106 05/15/2009   TRIG 99 04/15/2009   Lab Results  Component Value Date   CHOLHDL 6.0 Ratio 10/31/2009   CHOLHDL 5.3 Ratio 05/15/2009   CHOLHDL 5.4 Ratio 04/15/2009   No results found for this basename: LDLDIRECT   She is due for a fasting lipid recheck since starting her Simvastatin.  We will have her come back next week sometime for a fasting lab draw and follow up on the results from that.

## 2011-02-28 ENCOUNTER — Other Ambulatory Visit: Payer: Medicare Other

## 2011-02-28 DIAGNOSIS — E785 Hyperlipidemia, unspecified: Secondary | ICD-10-CM

## 2011-02-28 LAB — LIPID PANEL
HDL: 41 mg/dL (ref >39)
LDL Cholesterol: 130 mg/dL — ABNORMAL HIGH (ref 0–99)
Total CHOL/HDL Ratio: 4.7 Ratio
Triglycerides: 100 mg/dL (ref <150)
VLDL: 20 mg/dL (ref 0–40)

## 2011-02-28 LAB — COMPREHENSIVE METABOLIC PANEL
AST: 17 U/L (ref 0–37)
BUN: 13 mg/dL (ref 6–23)
Calcium: 9.2 mg/dL (ref 8.4–10.5)
Chloride: 102 mEq/L (ref 96–112)
Creat: 0.79 mg/dL (ref 0.50–1.10)
Total Bilirubin: 0.2 mg/dL — ABNORMAL LOW (ref 0.3–1.2)

## 2011-03-07 ENCOUNTER — Ambulatory Visit (INDEPENDENT_AMBULATORY_CARE_PROVIDER_SITE_OTHER): Payer: Medicare Other | Admitting: Internal Medicine

## 2011-03-07 ENCOUNTER — Encounter: Payer: Self-pay | Admitting: Internal Medicine

## 2011-03-07 DIAGNOSIS — Z299 Encounter for prophylactic measures, unspecified: Secondary | ICD-10-CM

## 2011-03-07 DIAGNOSIS — K1370 Unspecified lesions of oral mucosa: Secondary | ICD-10-CM

## 2011-03-07 DIAGNOSIS — Z23 Encounter for immunization: Secondary | ICD-10-CM

## 2011-03-07 DIAGNOSIS — N3090 Cystitis, unspecified without hematuria: Secondary | ICD-10-CM

## 2011-03-07 MED ORDER — CHLORHEXIDINE GLUCONATE 0.12 % MT SOLN: 15.0000 mL | Freq: Two times a day (BID) | OROMUCOSAL | Status: AC

## 2011-03-07 NOTE — Progress Notes (Deleted)
  Subjective:    Patient ID: Theresa Kirk, female    DOB: 05-26-1961, 50 y.o.   MRN: [id number]  HPI    Review of Systems     Objective:   Physical Exam        Assessment & Plan:

## 2011-03-07 NOTE — Assessment & Plan Note (Signed)
Lab Results  Component Value Date   CHOL 191 02/28/2011   CHOL 260* 10/31/2009   CHOL 222* 05/15/2009   Lab Results  Component Value Date   HDL 41 02/28/2011   HDL 43 7/82/9562   HDL 42 13/01/6577   Lab Results  Component Value Date   LDLCALC 130* 02/28/2011   LDLCALC 195* 10/31/2009   LDLCALC 159* 05/15/2009   Lab Results  Component Value Date   TRIG 100 02/28/2011   TRIG 110 10/31/2009   TRIG 106 05/15/2009   Lab Results  Component Value Date   CHOLHDL 4.7 02/28/2011   CHOLHDL 6.0 Ratio 10/31/2009   CHOLHDL 5.3 Ratio 05/15/2009   No results found for this basename: LDLDIRECT   Her cholesterol is much improved on her simvastatin and she is at goal.  We will continue it and see how she does.  When she returns for her follow up we need to be sure to discuss diet and exercise as additional control measures.  If she does ever need more LDL lowering she will likely need to switch to a stronger statin.

## 2011-03-07 NOTE — Progress Notes (Signed)
Subjective:   Patient ID: Theresa Kirk female   DOB: Feb 27, 1961 50 y.o.   MRN: [id number]  HPI: [name] is a 50 y.o. female with PMH significant for recurrent UTI's , Depression who presented to the clinic for another episode of burning sensation while urinating and blood in the urine  and mouth lesion.  1 Oral lesion:  she has been having lesion in her mouth since many month. She reports that it comes on a sudden, it will be located on the tongue and inner cheeks. It will be small lesions, they are painful and cause significant burning sensation. Furthermore they start off small but become bigger.  It is sometimes so painful that she is not able to eat. She smoked marijuana  But otherwise denies any changes in habits. She got a piercing in 08/2010 but the lesion already occurred prior to that. There are some month it occurred more often and other not 2. UTI: had recurrent infection in the past treated with antibiotics. Noticed since yesterday burning sensation with urination and some blood. Patient had hestorectomy.      Past Medical History  Diagnosis Date  . Depression   . Fibromyalgia   . Carpal tunnel syndrome   . Alcohol abuse    Current Outpatient Prescriptions  Medication Sig Dispense Refill  . meloxicam (MOBIC) 7.5 MG tablet Take 1 tablet (7.5 mg total) by mouth daily as needed.  30 tablet  1  . omeprazole (PRILOSEC) 40 MG capsule Take 1 capsule (40 mg total) by mouth daily.  30 capsule  1  . QUEtiapine (SEROQUEL XR) 200 MG 24 hr tablet Take 200 mg by mouth at bedtime.        . senna-docusate (SENOKOT-S) 8.6-50 MG per tablet Take 1 tablet by mouth daily as needed for constipation.  30 tablet  1  . sertraline (ZOLOFT) 25 MG tablet Take 50 mg by mouth daily.        . simvastatin (ZOCOR) 40 MG tablet Take 1 tablet (40 mg total) by mouth at bedtime.  30 tablet  3  . chlorhexidine (PERIDEX) 0.12 % solution Use as directed 15 mLs in the mouth or throat 2 (two) times daily.   120 mL  0   Family History  Problem Relation Age of Onset  . Diabetes Mother   . Hypertension Mother   . Stroke Mother   . Diabetes Sister   . Thyroid disease Sister   . Hypertension Sister    History   Social History  . Marital Status: Single    Spouse Name: N/A    Number of Children: N/A  . Years of Education: N/A   Social History Main Topics  . Smoking status: Former Smoker    Quit date: 06/03/2000  . Smokeless tobacco: None  . Alcohol Use: No  . Drug Use: Yes    Special: Marijuana  . Sexually Active: Yes -- Female partner(s)   Other Topics Concern  . None   Social History Narrative   Lives with mother in [place].Currently unemployed.Used to work at [employer] in [place].ETOH use: weekend, 1-2 beers from [employer]: used to drink everydayFormer smokerCurrent THC useH/o cocaine use: quit in 2001.   Review of Systems: Constitutional: Denies fever, chills, diaphoresis, appetite change and fatigue.  HEENT: Denies photophobia, eye pain, redness, hearing loss, ear pain, congestion, sore throat, rhinorrhea, sneezing,trouble swallowing, neck pain, neck stiffness and tinnitus.   Respiratory: Denies SOB, DOE, cough, chest tightness,  and wheezing.   Cardiovascular: Denies chest  pain, palpitations and leg swelling.  Gastrointestinal: Denies nausea, vomiting, abdominal pain, diarrhea, constipation, blood in stool and abdominal distention.  Genitourinary: noted dysuria, urgency, frequency, hematuria but denies flank pain  Neurological: Denies dizziness,  weakness, light-headedness, numbness and headaches.  Hematological: Denies adenopathy. Easy bruising, personal or family bleeding history    Objective:  Physical Exam: Filed Vitals:   03/07/11 1557  BP: 121/77  Pulse: 71  Temp: 98.7 F (37.1 C)  TempSrc: Oral  Height: 5\' 5"  (1.651 m)  Weight: 179 lb 12.8 oz (81.557 kg)   Constitutional: Vital signs reviewed.  Patient is a well-developed and well-nourished  in no acute  distress and cooperative with exam. Alert and oriented x3.  Mouth: left buccal, between 1st and 2nd molar : small crater like lesion without  erythema or exudates.  Neck: Supple Cardiovascular: RRR, S1 normal, S2 normal, no MRG, pulses symmetric and intact bilaterally Pulmonary/Chest: CTAB, no wheezes, rales, or rhonchi Abdominal: Soft. Non-tender, non-distended, bowel sounds are normal, no masses, organomegaly, or guarding present.  GU: no CVA tenderness Hematology: no cervical, Neurological: A&O x3,   no focal motor deficit

## 2011-03-07 NOTE — Assessment & Plan Note (Addendum)
Patient has recurrent UTIs in the past. Will obtain UA and send out for culture and treat accordingly. Furthermore will refer patient to Urology for further evaluation and management. Patient may need prophylactic antibiotic therapy.  Update 10/5: Will start patient on cipro 500 mg po bid. Will await culture for possible changes in management . Patient called and informed about the results and management.

## 2011-03-07 NOTE — Assessment & Plan Note (Signed)
Likely due to poor dentition. Will try to chlorhexidine bid and see if that improves her symptoms. She may need to be referred to an Dentist. Furthermore if her oral lesion does not improve consider evaluating for Herpes. We informed her if she notices the lesion to come worse to come in to for evaluation.

## 2011-03-08 LAB — URINALYSIS, ROUTINE W REFLEX MICROSCOPIC
Glucose, UA: NEGATIVE mg/dL
Ketones, ur: NEGATIVE mg/dL
Nitrite: NEGATIVE
Specific Gravity, Urine: 1.02 (ref 1.005–1.030)
pH: 6.5 (ref 5.0–8.0)

## 2011-03-08 LAB — URINALYSIS, MICROSCOPIC ONLY: Bacteria, UA: NONE SEEN

## 2011-03-08 MED ORDER — CIPROFLOXACIN HCL 500 MG PO TABS: 500.0000 mg | ORAL_TABLET | Freq: Two times a day (BID) | ORAL | Status: AC

## 2011-03-08 NOTE — Progress Notes (Signed)
Addended by: Almyra Deforest on: 03/08/2011 12:59 PM   Modules accepted: Orders

## 2011-03-09 LAB — URINE CULTURE

## 2011-03-11 ENCOUNTER — Telehealth: Payer: Self-pay | Admitting: Licensed Clinical Social Worker

## 2011-03-11 NOTE — Telephone Encounter (Signed)
Theresa Kirk called asking to have Medicaid card changed from [facility] to [facility].   Filled out DSS form and mailed to [facility] today to sign and return in envelope.

## 2011-03-19 ENCOUNTER — Inpatient Hospital Stay (INDEPENDENT_AMBULATORY_CARE_PROVIDER_SITE_OTHER)
Admission: RE | Admit: 2011-03-19 | Discharge: 2011-03-19 | Disposition: A | Discharge status: Home / Self Care | Payer: Medicare Other | Source: Ambulatory Visit | Attending: Emergency Medicine | Admitting: Emergency Medicine

## 2011-03-19 DIAGNOSIS — H109 Unspecified conjunctivitis: Secondary | ICD-10-CM

## 2011-03-24 ENCOUNTER — Inpatient Hospital Stay (INDEPENDENT_AMBULATORY_CARE_PROVIDER_SITE_OTHER)
Admission: RE | Admit: 2011-03-24 | Discharge: 2011-03-24 | Disposition: A | Discharge status: Home / Self Care | Payer: Medicaid Other | Source: Ambulatory Visit | Attending: Family Medicine | Admitting: Family Medicine

## 2011-03-24 DIAGNOSIS — J309 Allergic rhinitis, unspecified: Secondary | ICD-10-CM

## 2011-03-24 DIAGNOSIS — H1045 Other chronic allergic conjunctivitis: Secondary | ICD-10-CM

## 2011-04-08 ENCOUNTER — Encounter: Payer: Self-pay | Admitting: Internal Medicine

## 2011-04-08 NOTE — Progress Notes (Signed)
Seen by Urology NP Jetta Lout @ [facility] 04/02/11, our office received their note: Cysto test pending for 05/02/11, CT hematuria protocol also in their assessment/plan. Found to have trichomoniasis as well, treated with flagyl.  Also prescribed Phenazopyridine 200mg  TIDPRN for pain.

## 2011-04-11 ENCOUNTER — Encounter (HOSPITAL_COMMUNITY): Payer: Self-pay | Admitting: Emergency Medicine

## 2011-04-11 ENCOUNTER — Emergency Department (HOSPITAL_COMMUNITY)
Admission: EM | Admit: 2011-04-11 | Discharge: 2011-04-11 | Disposition: A | Discharge status: Home / Self Care | Payer: Medicare Other | Attending: Emergency Medicine | Admitting: Emergency Medicine

## 2011-04-11 DIAGNOSIS — R059 Cough, unspecified: Secondary | ICD-10-CM | POA: Insufficient documentation

## 2011-04-11 DIAGNOSIS — J029 Acute pharyngitis, unspecified: Secondary | ICD-10-CM

## 2011-04-11 DIAGNOSIS — R05 Cough: Secondary | ICD-10-CM | POA: Insufficient documentation

## 2011-04-11 HISTORY — DX: Pure hypercholesterolemia, unspecified: E78.00

## 2011-04-11 NOTE — ED Notes (Signed)
Had a scan on Tues  And then her throat was itching  And now feel like her  Throat is swollon, is able to eat and breath  Feels like she is hoarse

## 2011-04-11 NOTE — ED Provider Notes (Signed)
History    50yf with sore throat and occasional cough. No fever or chills. No n/v. Pt reports CT of abdomen 2d ago with contrast for evaluation of recurrent UTI. Says when had contrast felt itchiness and warmness in throat. Throat has felt weird since. Denies pain. No difficulty breathing or swallowing. No CP. No SOB. Occasional nonproductive cough. No sock contacts. No neck stiffness. No HA. No numbness, weakness or tingling.   CSN: [id number] Arrival date & time: 04/11/2011  9:19 AM   First MD Initiated Contact with Patient 04/11/11 [phone]      Chief Complaint  Patient presents with  . Cough  . Sore Throat    (Consider location/radiation/quality/duration/timing/severity/associated sxs/prior treatment) HPI  Past Medical History  Diagnosis Date  . Depression   . Fibromyalgia   . Carpal tunnel syndrome   . Alcohol abuse   . High cholesterol     Past Surgical History  Procedure Date  . Abdominal hysterectomy 2008    h/o uterine fibroids    Family History  Problem Relation Age of Onset  . Diabetes Mother   . Hypertension Mother   . Stroke Mother   . Diabetes Sister   . Thyroid disease Sister   . Hypertension Sister     History  Substance Use Topics  . Smoking status: Former Smoker    Quit date: 06/03/2000  . Smokeless tobacco: Never Used  . Alcohol Use: Yes    OB History    Grav Para Term Preterm Abortions TAB SAB Ect Mult Living                  Review of Systems   Review of symptoms negative unless otherwise noted in HPI.  Allergies  Codeine  Home Medications   Current Outpatient Rx  Name Route Sig Dispense Refill  . LORATADINE-PSEUDOEPHEDRINE 10-240 MG PO TB24 Oral Take 1 tablet by mouth daily as needed. For allergies     . MELOXICAM 7.5 MG PO TABS Oral Take 1 tablet (7.5 mg total) by mouth daily as needed. 30 tablet 1  . OMEPRAZOLE 40 MG PO CPDR Oral Take 40 mg by mouth daily as needed. For heartburn     . PRESCRIPTION MEDICATION  Patient is on an  eye drop prescribed by urgent care. She said it was [facility], but I can't find an encounter for that. Call pharmacy.     Marland Kitchen PRESCRIPTION MEDICATION  Patient is on an antibiotic ointment for her eye. Please call pharmacy.     . QUETIAPINE FUMARATE 200 MG PO TB24 Oral Take 200 mg by mouth at bedtime.      . SERTRALINE HCL 25 MG PO TABS Oral Take 50 mg by mouth daily.      Marland Kitchen SIMVASTATIN 40 MG PO TABS Oral Take 1 tablet (40 mg total) by mouth at bedtime. 30 tablet 3  . SENNOSIDES-DOCUSATE SODIUM 8.6-50 MG PO TABS Oral Take 1 tablet by mouth daily as needed for constipation. 30 tablet 1    BP 114/69  Pulse 80  Temp 98.2 F (36.8 C)  Resp 16  SpO2 99%  LMP 09/14/2006  Physical Exam  Nursing note and vitals reviewed. Constitutional: She appears well-developed and well-nourished. No distress.  HENT:  Head: Normocephalic and atraumatic.  Right Ear: External ear normal.  Left Ear: External ear normal.  Mouth/Throat: Oropharynx is clear and moist. No oropharyngeal exudate.       Neck supple. No signs of meningismus. No nodes. Uvula midline.  posterior pharynx clear. Handling secretions. No tongue elevation or neck tenderness. Submental tissues soft.  Eyes: Conjunctivae are normal. Right eye exhibits no discharge. Left eye exhibits no discharge.  Neck: Normal range of motion. Neck supple. No JVD present. No tracheal deviation present.  Cardiovascular: Normal rate, regular rhythm and normal heart sounds.  Exam reveals no gallop and no friction rub.   No murmur heard. Pulmonary/Chest: Effort normal and breath sounds normal. No stridor. No respiratory distress.  Abdominal: Soft. She exhibits no distension. There is no tenderness.  Musculoskeletal: She exhibits no tenderness.  Lymphadenopathy:    She has no cervical adenopathy.  Neurological: She is alert. No cranial nerve deficit.  Skin: Skin is warm and dry. No rash noted. No erythema.  Psychiatric: She has a normal mood and affect.  Her behavior is normal. Thought content normal.    ED Course  Procedures (including critical care time)  Labs Reviewed - No data to display No results found.   1. Sore throat       MDM  50yF with sore throat and occasional cough. Very low clinical suspicion for deep space infection of neck or meningitis. No respiratory distress. ENT and respiratory exams unremarkable. Doubt serious reaction to contrast 2 days out. Suspect viral URI. Discussed symptomatic care with pt such as salt water rinse/gargle, decongestants, tylenol/nsaids, etc. Feel pt appropriate for DC and outpt fu.        Raeford Razor, MD 04/11/11 [phone]

## 2011-04-16 ENCOUNTER — Encounter: Payer: Medicare Other | Admitting: Internal Medicine

## 2011-05-07 ENCOUNTER — Other Ambulatory Visit: Payer: Self-pay | Admitting: Internal Medicine

## 2011-05-07 ENCOUNTER — Ambulatory Visit (INDEPENDENT_AMBULATORY_CARE_PROVIDER_SITE_OTHER): Payer: Medicare Other | Admitting: Internal Medicine

## 2011-05-07 ENCOUNTER — Encounter: Payer: Self-pay | Admitting: Internal Medicine

## 2011-05-07 ENCOUNTER — Ambulatory Visit (HOSPITAL_COMMUNITY)
Admission: RE | Admit: 2011-05-07 | Discharge: 2011-05-07 | Disposition: A | Discharge status: Home / Self Care | Payer: Medicare Other | Source: Ambulatory Visit | Attending: Internal Medicine | Admitting: Internal Medicine

## 2011-05-07 VITALS — BP 116/78 | HR 74 | Temp 97.7°F | Ht 65.0 in | Wt 178.9 lb

## 2011-05-07 DIAGNOSIS — E785 Hyperlipidemia, unspecified: Secondary | ICD-10-CM

## 2011-05-07 DIAGNOSIS — R51 Headache: Secondary | ICD-10-CM | POA: Insufficient documentation

## 2011-05-07 DIAGNOSIS — M25529 Pain in unspecified elbow: Secondary | ICD-10-CM

## 2011-05-07 DIAGNOSIS — IMO0001 Reserved for inherently not codable concepts without codable children: Secondary | ICD-10-CM | POA: Insufficient documentation

## 2011-05-07 LAB — BASIC METABOLIC PANEL
CO2: 26 mEq/L (ref 19–32)
Chloride: 104 mEq/L (ref 96–112)
Sodium: 139 mEq/L (ref 135–145)

## 2011-05-07 MED ORDER — SIMVASTATIN 40 MG PO TABS: 40.0000 mg | ORAL_TABLET | Freq: Every day | ORAL | Status: DC

## 2011-05-07 MED ORDER — MELOXICAM 7.5 MG PO TABS: 7.5000 mg | ORAL_TABLET | Freq: Every day | ORAL | Status: DC | PRN

## 2011-05-07 MED ORDER — BUTALBITAL-APAP-CAFFEINE 50-325-40 MG PO TABS: 1.0000 | ORAL_TABLET | Freq: Four times a day (QID) | ORAL | Status: AC | PRN

## 2011-05-07 NOTE — Patient Instructions (Signed)

## 2011-05-07 NOTE — Assessment & Plan Note (Signed)
Given the patient's complaining of worsening headache, with questionable neurological findings of right leg weakness. We'll proceed with head CT to rule out any intracranial abnormalities, and prescribe fioricet for pain.

## 2011-05-07 NOTE — Progress Notes (Signed)
Subjective:    Patient ID: Theresa Kirk, female    DOB: 1960/07/12, 50 y.o.   MRN: [id number]  HPI  Patient is a 50 year old female with past medical history listed below, presents to the outpatient clinic complaining of left sided temple headache there have been ongoing since June, though patient notes that the past several weeks they have been getting increasingly worse, she describes the headache as sharp in nature associated with aura of flashing lights preceding headaches, the headaches may last all day. Number the patient also states that she has right leg weakness this is also been getting worse. She denies any other complaints, reports compliance with her medications, denies any fever, chills, nausea, vomiting.   Patient Active Problem List  Diagnoses  . FIBROIDS, UTERUS  . HYPERLIPIDEMIA  . ALCOHOL ABUSE  . DEPRESSION  . CORNS AND CALLUSES  . NECK PAIN  . ROTATOR CUFF TEAR  . FIBROMYALGIA  . ARM PAIN, RIGHT  . NUMBNESS, HAND  . HEADACHE  . CARPAL TUNNEL SYNDROME  . Abdominal pain, acute, generalized  . Constipation - functional  . Renal insufficiency  . Cystitis  . Oral lesion   Current Outpatient Prescriptions on File Prior to Visit  Medication Sig Dispense Refill  . loratadine-pseudoephedrine (CLARITIN-D 24-HOUR) 10-240 MG per 24 hr tablet Take 1 tablet by mouth daily as needed. For allergies       . meloxicam (MOBIC) 7.5 MG tablet Take 1 tablet (7.5 mg total) by mouth daily as needed.  30 tablet  1  . omeprazole (PRILOSEC) 40 MG capsule Take 40 mg by mouth daily as needed. For heartburn       . QUEtiapine (SEROQUEL XR) 200 MG 24 hr tablet Take 200 mg by mouth at bedtime.        . sertraline (ZOLOFT) 25 MG tablet Take 50 mg by mouth daily.        . simvastatin (ZOCOR) 40 MG tablet Take 1 tablet (40 mg total) by mouth at bedtime.  30 tablet  3  . PRESCRIPTION MEDICATION Patient is on an eye drop prescribed by urgent care. She said it was [facility], but I  can't find an encounter for that. Call pharmacy.       Marland Kitchen PRESCRIPTION MEDICATION Patient is on an antibiotic ointment for her eye. Please call pharmacy.       Marland Kitchen senna-docusate (SENOKOT-S) 8.6-50 MG per tablet Take 1 tablet by mouth daily as needed for constipation.  30 tablet  1   Allergies  Allergen Reactions  . Codeine Itching     Review of Systems  Neurological: Positive for weakness and headaches. Negative for dizziness, tremors, seizures, syncope, facial asymmetry, speech difficulty, light-headedness and numbness.  All other systems reviewed and are negative.       Objective:   Physical Exam  Vitals reviewed. Constitutional: She is oriented to person, place, and time. She appears well-developed and well-nourished.  HENT:  Head: Normocephalic and atraumatic.    Eyes: Pupils are equal, round, and reactive to light.  Neck: Normal range of motion. Neck supple. No JVD present. No thyromegaly present.  Cardiovascular: Normal rate, regular rhythm and normal heart sounds.   No murmur heard. Pulmonary/Chest: Effort normal and breath sounds normal. She has no wheezes. She has no rales.  Abdominal: Soft. Bowel sounds are normal.  Musculoskeletal: Normal range of motion. She exhibits no edema.       Legs: Neurological: She is alert and oriented to person,  place, and time.  Skin: Skin is warm and dry.          Assessment & Plan:

## 2011-06-28 ENCOUNTER — Emergency Department (HOSPITAL_COMMUNITY)
Admission: EM | Admit: 2011-06-28 | Discharge: 2011-06-28 | Disposition: A | Discharge status: Home / Self Care | Payer: Medicare Other | Attending: Emergency Medicine | Admitting: Emergency Medicine

## 2011-06-28 ENCOUNTER — Encounter (HOSPITAL_COMMUNITY): Payer: Self-pay

## 2011-06-28 DIAGNOSIS — IMO0001 Reserved for inherently not codable concepts without codable children: Secondary | ICD-10-CM | POA: Insufficient documentation

## 2011-06-28 DIAGNOSIS — E7800 Pure hypercholesterolemia, unspecified: Secondary | ICD-10-CM | POA: Insufficient documentation

## 2011-06-28 DIAGNOSIS — N644 Mastodynia: Secondary | ICD-10-CM | POA: Insufficient documentation

## 2011-06-28 MED ORDER — IBUPROFEN 800 MG PO TABS
800.0000 mg | ORAL_TABLET | Freq: Once | ORAL | Status: AC
  Administered 2011-06-28: 800 mg via ORAL
  Filled 2011-06-28: qty 1

## 2011-06-28 NOTE — ED Notes (Signed)
Pt c/o R areolar pain x1 day, denies any drainage, describes the pain as throbbing pain, rates pain 7/10, pain relieved w/bra support

## 2011-06-28 NOTE — ED Notes (Signed)
Pt denies any injury to area

## 2011-06-28 NOTE — ED Provider Notes (Signed)
History     CSN: [id number]  Arrival date & time 06/28/11  0949   None     Chief Complaint  Patient presents with  . Breast Pain    (Consider location/radiation/quality/duration/timing/severity/associated sxs/prior treatment) The history is provided by the patient. No language interpreter was used.  CC:  C/o L nipple pain since last pm.  Denies discharge or abscess.  No pmh of abscess.  Took nothing for pain..  Past Medical History  Diagnosis Date  . Depression   . Fibromyalgia   . Carpal tunnel syndrome   . Alcohol abuse   . High cholesterol     Past Surgical History  Procedure Date  . Abdominal hysterectomy 2008    h/o uterine fibroids    Family History  Problem Relation Age of Onset  . Diabetes Mother   . Hypertension Mother   . Stroke Mother   . Diabetes Sister   . Thyroid disease Sister   . Hypertension Sister     History  Substance Use Topics  . Smoking status: Former Smoker    Quit date: 06/03/2000  . Smokeless tobacco: Never Used  . Alcohol Use: Yes    OB History    Grav Para Term Preterm Abortions TAB SAB Ect Mult Living                  Review of Systems  All other systems reviewed and are negative.    Allergies  Codeine  Home Medications   Current Outpatient Rx  Name Route Sig Dispense Refill  . BUTALBITAL-APAP-CAFFEINE 50-325-40 MG PO TABS Oral Take 1-2 tablets by mouth every 6 (six) hours as needed for headache. 30 tablet 0  . LORATADINE-PSEUDOEPHEDRINE ER 10-240 MG PO TB24 Oral Take 1 tablet by mouth daily as needed. For allergies     . MELOXICAM 7.5 MG PO TABS Oral Take 7.5 mg by mouth daily as needed. pain    . OMEPRAZOLE 40 MG PO CPDR Oral Take 40 mg by mouth daily as needed. For heartburn     . QUETIAPINE FUMARATE ER 200 MG PO TB24 Oral Take 200 mg by mouth at bedtime.      Bernadette Hoit SODIUM 8.6-50 MG PO TABS Oral Take 1 tablet by mouth daily as needed for constipation. 30 tablet 1  . SERTRALINE HCL 25 MG PO TABS  Oral Take 50 mg by mouth daily.      Marland Kitchen SIMVASTATIN 40 MG PO TABS Oral Take 1 tablet (40 mg total) by mouth at bedtime. 30 tablet 3    BP 120/74  Pulse 105  Temp(Src) 98 F (36.7 C) (Oral)  Resp 20  SpO2 100%  LMP 09/14/2006  Physical Exam  Nursing note and vitals reviewed. Constitutional: She is oriented to person, place, and time. She appears well-developed and well-nourished.  HENT:  Head: Normocephalic and atraumatic.  Eyes: Conjunctivae and EOM are normal. Pupils are equal, round, and reactive to light.  Neck: Normal range of motion.  Cardiovascular: Normal rate, regular rhythm, normal heart sounds and intact distal pulses.  Exam reveals no gallop and no friction rub.   No murmur heard. Pulmonary/Chest: Effort normal and breath sounds normal. No respiratory distress.  Abdominal: Soft. Bowel sounds are normal.  Musculoskeletal: Normal range of motion. She exhibits no edema and no tenderness.       L nipple tender to touch.  No abscess or discharge noted.  Neurological: She is alert and oriented to person, place, and time.  Skin: Skin  is warm and dry.  Psychiatric: She has a normal mood and affect.    ED Course  Procedures (including critical care time)  Labs Reviewed - No data to display No results found.   No diagnosis found.    MDM  L nipple pain x 12 hours.  No abscess, lumps or discharge.  Will take tylenol for pain.  Neosporin x several hours. She will schedule her  Annual mamogram.  Follow up if not better with pcp on Monday.        Jethro Bastos, NP 06/29/11 1120

## 2011-06-30 NOTE — ED Provider Notes (Signed)
Medical screening examination/treatment/procedure(s) were performed by non-physician practitioner and as supervising physician I was immediately available for consultation/collaboration.   Jasman Murri, MD 06/30/11 1810 

## 2011-07-02 ENCOUNTER — Other Ambulatory Visit: Payer: Self-pay | Admitting: Internal Medicine

## 2011-07-08 ENCOUNTER — Encounter: Payer: Medicare Other | Admitting: Internal Medicine

## 2011-11-18 ENCOUNTER — Ambulatory Visit (INDEPENDENT_AMBULATORY_CARE_PROVIDER_SITE_OTHER): Payer: Medicare Other | Admitting: Internal Medicine

## 2011-11-18 ENCOUNTER — Encounter: Payer: Self-pay | Admitting: Internal Medicine

## 2011-11-18 VITALS — BP 116/76 | HR 72 | Temp 97.1°F | Ht 65.0 in | Wt 170.5 lb

## 2011-11-18 DIAGNOSIS — N390 Urinary tract infection, site not specified: Secondary | ICD-10-CM

## 2011-11-18 DIAGNOSIS — R350 Frequency of micturition: Secondary | ICD-10-CM

## 2011-11-18 LAB — POCT URINALYSIS DIPSTICK
Bilirubin, UA: NEGATIVE
Glucose, UA: NEGATIVE
Nitrite, UA: NEGATIVE
pH, UA: 5

## 2011-11-18 MED ORDER — SULFAMETHOXAZOLE-TRIMETHOPRIM 800-160 MG PO TABS: 1.0000 | ORAL_TABLET | Freq: Two times a day (BID) | ORAL | Status: AC

## 2011-11-18 NOTE — Addendum Note (Signed)
Addended by: Angelina Ok F on: 11/18/2011 04:18 PM   Modules accepted: Orders

## 2011-11-18 NOTE — Assessment & Plan Note (Addendum)
Patient has had recurrent UTIs. Was evaluated in the fall of 2012 by urology for gross hematuria that included CT scan and cystoscopy - both normal. Was treated for Trichomonas as well as numerous UTIs. Questionable history of positive cultures. Now, patient appears to have symptoms consistent with UTI, urine dip in the office shows large blood and trace leukocytes. Only 50 cc in total in the bladder (did in and out cath). - Bactrim DS twice a day x3 days - UA, urine culture - Call in 36 hours if not improved - Consider wet prep or empiric treatment with Flagyl if symptoms persist - May need to see urology again - ? Flomax

## 2011-11-18 NOTE — Progress Notes (Signed)
Subjective:     Patient ID: Theresa Kirk, female   DOB: 07/25/1960, 51 y.o.   MRN: [id number]  HPI [name] is a very pleasant 51 year old woman with a history of depression and fibromyalgia who presents with increased urinary frequency and hesitation  Starting last night the patient describes difficulty urinating, with the urge to urinate but difficulty with the active. She is able to get a few drops, and then the urge to urinate roughly returns. She also has some dysuria. This is a new problem for her, but she does have a history of urinary tract infection. She denies fevers or chills. No back pain.  Review of Systems As per history of present illness    Objective:   Physical Exam Gen: mild discomfort, cooperative Abd: soft, mild TTP at midline, no CVAT  GU: ~50cc's of urine in bladder on in/out cath    Assessment:         Plan:

## 2011-11-19 LAB — URINALYSIS, ROUTINE W REFLEX MICROSCOPIC
Bilirubin Urine: NEGATIVE
Glucose, UA: NEGATIVE mg/dL
Ketones, ur: NEGATIVE mg/dL
Nitrite: NEGATIVE
Specific Gravity, Urine: 1.025 (ref 1.005–1.030)
pH: 5.5 (ref 5.0–8.0)

## 2011-11-19 LAB — URINALYSIS, MICROSCOPIC ONLY
Casts: NONE SEEN
Crystals: NONE SEEN

## 2011-11-20 ENCOUNTER — Telehealth: Payer: Self-pay | Admitting: Internal Medicine

## 2011-11-20 LAB — URINE CULTURE: Colony Count: 2000

## 2011-11-20 NOTE — Telephone Encounter (Signed)
Spoke to patient regarding her UA results. The patient says that she is feeling much improved and able to urinate much better. She has one more day of her antibiotics. I explained that her UA did not show any bacteria but did show some white blood cells. I said that if her symptoms of urinary retention return if she should be sure to come back to clinic, and she was agreeable.

## 2012-03-19 ENCOUNTER — Ambulatory Visit (HOSPITAL_COMMUNITY)
Admission: RE | Admit: 2012-03-19 | Discharge: 2012-03-19 | Disposition: A | Discharge status: Home / Self Care | Payer: Medicare HMO | Source: Ambulatory Visit | Attending: Internal Medicine | Admitting: Internal Medicine

## 2012-03-19 ENCOUNTER — Ambulatory Visit (INDEPENDENT_AMBULATORY_CARE_PROVIDER_SITE_OTHER): Payer: Medicare HMO | Admitting: Internal Medicine

## 2012-03-19 ENCOUNTER — Other Ambulatory Visit: Payer: Self-pay | Admitting: Internal Medicine

## 2012-03-19 ENCOUNTER — Encounter: Payer: Self-pay | Admitting: Internal Medicine

## 2012-03-19 VITALS — BP 106/70 | HR 67 | Temp 97.1°F | Ht 65.0 in | Wt 173.3 lb

## 2012-03-19 DIAGNOSIS — Z79899 Other long term (current) drug therapy: Secondary | ICD-10-CM

## 2012-03-19 DIAGNOSIS — Z139 Encounter for screening, unspecified: Secondary | ICD-10-CM

## 2012-03-19 DIAGNOSIS — K219 Gastro-esophageal reflux disease without esophagitis: Secondary | ICD-10-CM

## 2012-03-19 DIAGNOSIS — R059 Cough, unspecified: Secondary | ICD-10-CM

## 2012-03-19 DIAGNOSIS — M25519 Pain in unspecified shoulder: Secondary | ICD-10-CM

## 2012-03-19 DIAGNOSIS — R05 Cough: Secondary | ICD-10-CM

## 2012-03-19 DIAGNOSIS — M25512 Pain in left shoulder: Secondary | ICD-10-CM

## 2012-03-19 DIAGNOSIS — E785 Hyperlipidemia, unspecified: Secondary | ICD-10-CM

## 2012-03-19 DIAGNOSIS — M25569 Pain in unspecified knee: Secondary | ICD-10-CM

## 2012-03-19 DIAGNOSIS — M25561 Pain in right knee: Secondary | ICD-10-CM

## 2012-03-19 DIAGNOSIS — N6452 Nipple discharge: Secondary | ICD-10-CM

## 2012-03-19 DIAGNOSIS — F329 Major depressive disorder, single episode, unspecified: Secondary | ICD-10-CM

## 2012-03-19 DIAGNOSIS — N6459 Other signs and symptoms in breast: Secondary | ICD-10-CM

## 2012-03-19 LAB — LIPID PANEL: LDL Cholesterol: 165 mg/dL — ABNORMAL HIGH (ref 0–99)

## 2012-03-19 LAB — CBC WITH DIFFERENTIAL/PLATELET
Basophils Absolute: 0 10*3/uL (ref 0.0–0.1)
Eosinophils Relative: 2 % (ref 0–5)
HCT: 41 % (ref 36.0–46.0)
Lymphocytes Relative: 40 % (ref 12–46)
MCV: 89.7 fL (ref 78.0–100.0)
Monocytes Absolute: 0.7 10*3/uL (ref 0.1–1.0)
RDW: 13.8 % (ref 11.5–15.5)
WBC: 11.8 10*3/uL — ABNORMAL HIGH (ref 4.0–10.5)

## 2012-03-19 MED ORDER — MELOXICAM 7.5 MG PO TABS: 7.5000 mg | ORAL_TABLET | Freq: Every day | ORAL | Status: DC | PRN

## 2012-03-19 MED ORDER — PANTOPRAZOLE SODIUM 20 MG PO TBEC: 20.0000 mg | DELAYED_RELEASE_TABLET | Freq: Every day | ORAL | Status: DC

## 2012-03-19 MED ORDER — SIMVASTATIN 40 MG PO TABS: 40.0000 mg | ORAL_TABLET | Freq: Every day | ORAL | Status: DC

## 2012-03-19 NOTE — Progress Notes (Signed)
  Subjective:    Patient ID: Theresa Kirk, female    DOB: Jan 12, 1961, 51 y.o.   MRN: [id number]  HPI Feels well. States she's been having pain in left shoulder, right knee for years.  She states she had a referral to sports medicine this year but had some insurance issue and was unable to go.  Denies any history of injury to left shoulder. States her right knee has been hurting her especially when heading down stairs. No history of falls or injury to her knee. Denies CP. She states she's been having a cough w/ phlegm production since June "that won't go away". Denies fever, chills. Admits to marijuana use "everyday if I can". Denies vomiting, diarrhea, abdominal pain. Denies dysuria or difficulty urinating. States she had some temporary nipple discharge in June which resolved (described as clear in color). She admits to some bilateral tenderness below her nipples when she's touches this area. PAP was done one year ago. She is s/p TAH in 2008 due to fibroids.  Review of Systems Complete 13 point review of systems is otherwise negative.    Objective:   Physical Exam Filed Vitals:   03/19/12 0907  BP: 106/70  Pulse: 67  Temp: 97.1 F (36.2 C)  Body mass index is 28.84 kg/(m^2).  GEN: AAOx3, NAD. HEENT: EOMI, PERRLA, no icterus, no adenopathy. CV: S1S2, no m/r/g, RRR. PULM: scattered rhonchi bilat, no wheeze. ABD/GI: Soft, NT, +BS, no guarding, no distention. LE/UE: 2/4 pulses, no c/c/e. NEURO: CN II-XII intact, no focal deficits. MSK: Left UE with full range of motion passive and active, no point tenderness,           Left LE with full range of motion passive and active, minimal medial tenderness. No effusion. Breast exam (chaperone Gladys Herbin): Mild bilateral point tenderness inferior to nipples bilat, no palpable masses, no nipple discharge, no palpable masses, no adenopathy. Symmetrical.    Assessment & Plan:  51 yr. Old female w/ pmhx significant for HL, alcohol abuse,  depression, fibromyalgia, GERD, presents for follow up. 1) hx nipple discharge: She has hx of discharge without recurrence, unilateral. Obtain mammo bilat and prolactin. 2) Depression: Stable on seroquel and zoloft by mental health. 3) GERD: omeprazole, stable. 4) HL: On statin therapy. Last LDL 130. LFT's and lipid panel today. 5) Left shoulder pain/right knee pain: Exam mostly unremarkble. Send to sports medicine. 6) Chronic cough: CXR today due to findings on exam. May have chronic bronchitis. Advised to quit smoking marijuana. 7) Health Maintenance: Colonoscopy needed, will refer. Flu vaccine today. Repeat HbA1c, last 6.5%. Advised about current weight and educated on exercise. -return in 3 months. Jonah Blue

## 2012-03-19 NOTE — Patient Instructions (Addendum)
-  Obtain mammogram and chest xray. -Have your PAP done by Gynecology. -Your need your colonoscopy, referral made to GI for this. -Blood work today. -referral made to sports medicine. -refills on your medications sent.

## 2012-03-20 LAB — COMPLETE METABOLIC PANEL WITH GFR
Albumin: 4.6 g/dL (ref 3.5–5.2)
CO2: 25 mEq/L (ref 19–32)
Calcium: 9.6 mg/dL (ref 8.4–10.5)
Chloride: 103 mEq/L (ref 96–112)
GFR, Est African American: >89 mL/min
GFR, Est Non African American: 88 mL/min
Glucose, Bld: 88 mg/dL (ref 70–99)
Potassium: 4.2 mEq/L (ref 3.5–5.3)
Sodium: 135 mEq/L (ref 135–145)
Total Bilirubin: 0.5 mg/dL (ref 0.3–1.2)
Total Protein: 7.2 g/dL (ref 6.0–8.3)

## 2012-03-25 ENCOUNTER — Other Ambulatory Visit: Payer: Self-pay | Admitting: Internal Medicine

## 2012-03-25 ENCOUNTER — Ambulatory Visit (INDEPENDENT_AMBULATORY_CARE_PROVIDER_SITE_OTHER): Payer: Medicare HMO | Admitting: Family Medicine

## 2012-03-25 ENCOUNTER — Ambulatory Visit
Admission: RE | Admit: 2012-03-25 | Discharge: 2012-03-25 | Disposition: A | Discharge status: Home / Self Care | Payer: Medicare HMO | Source: Ambulatory Visit | Attending: Internal Medicine | Admitting: Internal Medicine

## 2012-03-25 ENCOUNTER — Encounter: Payer: Self-pay | Admitting: Family Medicine

## 2012-03-25 VITALS — BP 117/81 | HR 67 | Ht 65.0 in | Wt 173.0 lb

## 2012-03-25 DIAGNOSIS — N6452 Nipple discharge: Secondary | ICD-10-CM

## 2012-03-25 DIAGNOSIS — M25519 Pain in unspecified shoulder: Secondary | ICD-10-CM

## 2012-03-25 DIAGNOSIS — M25512 Pain in left shoulder: Secondary | ICD-10-CM

## 2012-03-25 DIAGNOSIS — M25569 Pain in unspecified knee: Secondary | ICD-10-CM

## 2012-03-25 DIAGNOSIS — M25561 Pain in right knee: Secondary | ICD-10-CM

## 2012-03-25 NOTE — Patient Instructions (Addendum)
Thank you for coming in today. Give the injection a while to work.  Continue the exercises.  Call or go to the ER if you develop a large red swollen joint with extreme pain or oozing puss.  Come back in 1 month if needed

## 2012-03-25 NOTE — Progress Notes (Signed)
Patient ID: [name], female   DOB: Mar 24, 1961, 51 y.o.   MRN: [id number] SUBJECTIVE: Theresa Kirk is a 51 y.o. female who presents today for chief complaint of left shoulder pain.  Theresa Kirk reports first noticing shoulder in 2008 with pain, with progressive worsening since then.  Theresa Kirk reports pain now, worse at first thing in the morning, especially if she slept on the shoulder.  Occasional wakes her from her sleep.   Theresa Kirk reports pain throughout the day as well but mornings are worse.  Theresa Kirk reports quick movements cause majority of pain.  She also reports inability to perform abduction, grab objects off shelf..etc. Without having shoulder give way and fall back down.  Avoids holding weight/objects in the arm because of falling.  Also reports popping, catching but denies any edema at any time.  Theresa Kirk also reports occasional RIGHT knee pain but would like to focus on shoulder today.  Denies any imaging to knees.  PMHx: Theresa Kirk denies, just shoulder  PSHx: Hysterectomy 2/2 fibroids  Meds: Zoloft 25mg  daily Simvastatin 40mg   Seroquel 200mg  qHS Mobic 7.5 mg daily, prn Protonix 20mg  daily  Exam:  BP 117/81  Pulse 67  Ht 5\' 5"  (1.651 m)  Wt 173 lb (78.472 kg)  BMI 28.79 kg/m2  LMP 09/14/2006 Gen: Well NAD Left shoulder: Normal-appearing Range of motion full in abduction and forward flexion external and internal rotation to the thoracic level.   Strength: 5/5 supraspinatus external and internal Impingement: Positive Hawkins and Neer's test Labrum: Positive O'Brien and empty can.  Negative sulcus test Biceps: Nontender over the bicipital groove.  Negative Yergason's and speeds test A.c.: Negative crossover arm test  Neuro: Distal neurologic function included sensation strength coordination intact bilateral upper extremities CV: Distal pulses and capillary refill intact  Musculoskeletal ultrasound: Left shoulder Biceps tendon: Normal-appearing on long and short views Subscapularis: Well defined  no tears normal appearing on long and short views Supraspinatus: Well defined no tears normal appearing on long and short views Subacromial bursa: Mildly thickened on impingement testing Infraspinatus; well-appearing well defined no tears normal long and short views A.c. Joint: Normal-appearing  Subacromial bursa injection of the left shoulder Consent obtained and time out performed.  Area cleaned with alcohol.  40Mg  of depomedrol and 4ml of 0.5% marcaine was injected into the subacromial bursa without complication or bleeding. Patient tolerated the procedure well. Pain diminished following injection.

## 2012-03-26 DIAGNOSIS — M25561 Pain in right knee: Secondary | ICD-10-CM | POA: Insufficient documentation

## 2012-03-26 DIAGNOSIS — M1711 Unilateral primary osteoarthritis, right knee: Secondary | ICD-10-CM | POA: Insufficient documentation

## 2012-03-26 NOTE — Assessment & Plan Note (Signed)
Deferred formal evaluation of this problem until a subsequent visit. Will obtain x-rays of the knee in the interim to evaluate for DJD changes.

## 2012-03-26 NOTE — Assessment & Plan Note (Signed)
>>  ASSESSMENT AND PLAN FOR RIGHT KNEE PAIN WRITTEN ON 03/26/2012  7:37 AM BY Rodolph Bong, MD  Deferred formal evaluation of this problem until a subsequent visit. Will obtain x-rays of the knee in the interim to evaluate for DJD changes.

## 2012-03-26 NOTE — Assessment & Plan Note (Signed)
>>  ASSESSMENT AND PLAN FOR BILATERAL KNEE PAIN WRITTEN ON 08/12/2022  9:16 AM BY Shuntia Exton, MD  >>ASSESSMENT AND PLAN FOR RIGHT KNEE PAIN WRITTEN ON 03/26/2012  7:37 AM BY COREY, Butch Cashing, MD  Deferred formal evaluation of this problem until a subsequent visit. Will obtain x-rays of the knee in the interim to evaluate for DJD changes.

## 2012-03-26 NOTE — Assessment & Plan Note (Signed)
Doing well with home exercise program. Likely subacromial bursitis versus labrum injury Plan: Subacromial bursa injection today Continue home exercises If not improved consider MRI arthrogram of the left shoulder to evaluate for labral pathology Return in one month

## 2012-03-31 ENCOUNTER — Ambulatory Visit
Admission: RE | Admit: 2012-03-31 | Discharge: 2012-03-31 | Disposition: A | Discharge status: Home / Self Care | Payer: Medicare HMO | Source: Ambulatory Visit | Attending: Sports Medicine | Admitting: Sports Medicine

## 2012-03-31 DIAGNOSIS — M25561 Pain in right knee: Secondary | ICD-10-CM

## 2012-04-08 ENCOUNTER — Ambulatory Visit (INDEPENDENT_AMBULATORY_CARE_PROVIDER_SITE_OTHER): Payer: Medicare HMO | Admitting: Surgery

## 2012-04-13 ENCOUNTER — Encounter: Payer: Self-pay | Admitting: Internal Medicine

## 2012-04-13 ENCOUNTER — Ambulatory Visit (INDEPENDENT_AMBULATORY_CARE_PROVIDER_SITE_OTHER): Payer: Medicare HMO | Admitting: Internal Medicine

## 2012-04-13 VITALS — BP 108/65 | HR 74 | Temp 97.6°F | Ht 65.0 in | Wt 175.4 lb

## 2012-04-13 DIAGNOSIS — M25512 Pain in left shoulder: Secondary | ICD-10-CM

## 2012-04-13 DIAGNOSIS — E785 Hyperlipidemia, unspecified: Secondary | ICD-10-CM

## 2012-04-13 DIAGNOSIS — D249 Benign neoplasm of unspecified breast: Secondary | ICD-10-CM

## 2012-04-13 DIAGNOSIS — Z0000 Encounter for general adult medical examination without abnormal findings: Secondary | ICD-10-CM | POA: Insufficient documentation

## 2012-04-13 DIAGNOSIS — Z23 Encounter for immunization: Secondary | ICD-10-CM

## 2012-04-13 DIAGNOSIS — M25519 Pain in unspecified shoulder: Secondary | ICD-10-CM

## 2012-04-13 HISTORY — DX: Encounter for general adult medical examination without abnormal findings: Z00.00

## 2012-04-13 NOTE — Patient Instructions (Addendum)
Please follow up in 3-4 months with Dr. Kem Kays  Colonoscopy A colonoscopy is an exam to look at your colon. This exam helps to find lumps (tumors), growths (polyps), puffiness (swelling), and bleeding in your colon.  BEFORE THE PROCEDURE  You may need to drink clear liquids for 2 days before the exam.  Ask your doctor about changing or stopping your regular medicines.  You may need liquid or medicines put in your butt (enema or laxatives) to help you poop.  You may need to drink a liquid over a short amount of time. This liquid cleans your colon.  Ask your doctor what time you need to arrive. PROCEDURE A tube is put in the opening of your butt (anus) and into your colon. The doctor will look for anything that is not normal. Your doctor may take a tissue sample (biopsy) from your colon to be looked at more closely. AFTER THE PROCEDURE  Have someone drive you home if you took pain medicine or a medicine to relax you (sedative).  You may see blood in your poop (stool). This is normal.  You may pass gas and have belly (abdominal) cramps. This is normal. Finding out the results of your test Ask when your test results will be ready. Make sure you get your test results. Document Released: 06/22/2010 Document Revised: 08/12/2011 Document Reviewed: 06/22/2010 [facility] Patient Information 2013 [place], [place]. Pain Management, Chronic You have a painful condition that has required frequent use of narcotic-type pain medicine. We would like to see that you receive the best possible care for your problem. To achieve this, you must have a personal physician who can supervise a treatment plan for you. You may locate a personal physician on your own or contact one of the doctors whose name has been given to you. If your physician determines that you need to visit the Emergency Department for pain control, that doctor should provide you with a pain contract. This is a letter from your doctor which  describes what pain medicine you may receive, how much and how often. You sign it agreeing to the terms of the treatment plan. Bring this with each time you come to this facility. It will help the Emergency Physician provide the proper treatment for you with minimal delay. Please Note: In the future you may not be able to receive narcotic pain medicine from this facility without a pain contract or telephone approval from your personal physician. Document Released: 01/11/2004 Document Revised: 08/12/2011 Document Reviewed: 05/16/2008 [facility] Patient Information 2013 [place], [place].  Galactorrhea  Galactorrhea is when there is a milky fluid (discharge) coming from the nipple. It is different from normal milk in nursing mothers. HOME CARE  Avoid touching the breasts during sexual activity.  Perform a breast self exam once a month.  Avoid clothes that rub on your nipples.  Use breasts pads to absorb the fluid.  Wear a support bra. GET HELP RIGHT AWAY IF:  You have galactorrhea and you are trying to get pregnant.  You develop hot flashes, vaginal dryness, or lack of sexual desire.  You stop having menstrual periods, or they are not regular or far apart.  You have headaches.  You have vision problems.  Your breast discharge is bloody or yellowish white (pus-like).  You have breast pain.  You feel a lump in your breast.  Your breast shows wrinkling or dimpling.  Your breast becomes red and puffy (swollen). MAKE SURE YOU:  Understand these instructions.  Will watch your  condition.  Will get help right away if you are not doing well or get worse. Document Released: 06/22/2010 Document Revised: 08/12/2011 Document Reviewed: 06/22/2010 [facility] Patient Information 2013 [place], [place].
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
## 2012-04-13 NOTE — Assessment & Plan Note (Signed)
Will see Dr. Elnoria Howard 04/16/12 for appt to get colonoscopy Given Flu shot today In the future will need Tdap Will hold on OB/GYN appt for pap smear until get records [facility] 2008 regarding ?partial hysterectomy or TAH

## 2012-04-13 NOTE — Assessment & Plan Note (Addendum)
Improved pain and ROM after steroid inj via Sports medicine SM thought possibly subacromial bursitis  Patient will f/u with SM in the future

## 2012-04-13 NOTE — Assessment & Plan Note (Addendum)
Cont Zocor 40 mg qhs Lipid Panel     Component Value Date/Time   CHOL 231* 03/19/2012 1033   TRIG 123 03/19/2012 1033   HDL 41 03/19/2012 1033   CHOLHDL 5.6 03/19/2012 1033   VLDL 25 03/19/2012 1033   LDLCALC 165* 03/19/2012 1033   LDL not at goal <100

## 2012-04-13 NOTE — Progress Notes (Signed)
I saw, examined, and discussed the patient with Dr McLean and agree with the note contained here.  

## 2012-04-13 NOTE — Assessment & Plan Note (Addendum)
Noted on ductogram 03/2012 No further nipple discharge noted since 03/2012 OV Patient has not followed with surgery will reschedule and then f/u to clinic with Dr. Kem Kays

## 2012-04-16 ENCOUNTER — Telehealth: Payer: Self-pay | Admitting: *Deleted

## 2012-04-16 NOTE — Telephone Encounter (Signed)
Called pt; stated she had to re-schedule her appt to Dec 3 w/Dr Tsuei at [facility].

## 2012-04-16 NOTE — Telephone Encounter (Signed)
Message copied by Hassan Buckler on Thu Apr 16, 2012  3:08 PM ------      Message from: Jonah Blue      Created: Wed Apr 15, 2012  3:23 PM      Regarding: follow up       Please call this patient and remind her to follow up with surgical consult for her breast abnormality. I called her without answer. Thanks.

## 2012-05-05 ENCOUNTER — Ambulatory Visit (INDEPENDENT_AMBULATORY_CARE_PROVIDER_SITE_OTHER): Payer: Medicare HMO | Admitting: Surgery

## 2012-05-05 ENCOUNTER — Encounter (INDEPENDENT_AMBULATORY_CARE_PROVIDER_SITE_OTHER): Payer: Self-pay | Admitting: Surgery

## 2012-05-05 VITALS — BP 122/64 | HR 72 | Temp 97.7°F | Resp 20 | Ht 65.0 in | Wt 169.2 lb

## 2012-05-05 DIAGNOSIS — Z87898 Personal history of other specified conditions: Secondary | ICD-10-CM

## 2012-05-05 NOTE — Progress Notes (Signed)
Patient ID: Theresa Kirk, female   DOB: [birth year], 51 y.o.   MRN: [id number]  Chief Complaint  Patient presents with  . New Evaluation    eval Rt br    HPI Theresa Kirk is a 51 y.o. female.  Referred by Dr. Jonah Blue for evaluation of right nipple drainage HPI This is a 51 year old female who presents with a single episode of clear to milky discharge from a single duct orifice in the right nipple. This occurred about 4 months ago. She was then referred for mammogram. The mammogram was normal on both sides. The radiologist was able to express some drainage from a single duct orifice at 10:00 on the right side. A ductogram wasn't performed. This showed a single tiny filling defect in a fourth order branch of the duct almost directly retroareolar about 5 cm deep in the breast. She has had no further drainage. She comes in today for surgical consultation.  Past Medical History  Diagnosis Date  . Depression   . Fibromyalgia   . Carpal tunnel syndrome   . Alcohol abuse   . High cholesterol   . Arthritis     Past Surgical History  Procedure Date  . Abdominal hysterectomy 2008    h/o uterine fibroids, ?partial hysterectomy per pt 04/13/12  . Colonoscopy     Family History  Problem Relation Age of Onset  . Diabetes Mother   . Hypertension Mother   . Stroke Mother   . Diabetes Sister   . Thyroid disease Sister   . Hypertension Sister   . Cancer Maternal Aunt     unknown    Social History History  Substance Use Topics  . Smoking status: Former Smoker    Quit date: 06/03/2000  . Smokeless tobacco: Never Used  . Alcohol Use: Yes    No Active Allergies  Current Outpatient Prescriptions  Medication Sig Dispense Refill  . butalbital-acetaminophen-caffeine (FIORICET) 50-325-40 MG per tablet Take 1-2 tablets by mouth every 6 (six) hours as needed for headache.  30 tablet  0  . meloxicam (MOBIC) 7.5 MG tablet Take 1 tablet (7.5 mg total) by mouth daily as needed for  pain. pain  30 tablet  3  . pantoprazole (PROTONIX) 20 MG tablet Take 1 tablet (20 mg total) by mouth daily.  30 tablet  3  . QUEtiapine (SEROQUEL XR) 200 MG 24 hr tablet Take 200 mg by mouth at bedtime.        . sertraline (ZOLOFT) 25 MG tablet Take 50 mg by mouth daily.        . simvastatin (ZOCOR) 40 MG tablet Take 1 tablet (40 mg total) by mouth at bedtime.  30 tablet  3    Review of Systems Review of Systems  Constitutional: Negative for fever, chills and unexpected weight change.  HENT: Negative for hearing loss, congestion, sore throat, trouble swallowing and voice change.   Eyes: Negative for visual disturbance.  Respiratory: Negative for cough and wheezing.   Cardiovascular: Negative for chest pain, palpitations and leg swelling.  Gastrointestinal: Negative for nausea, vomiting, abdominal pain, diarrhea, constipation, blood in stool, abdominal distention and anal bleeding.  Genitourinary: Negative for hematuria, vaginal bleeding and difficulty urinating.  Musculoskeletal: Positive for joint swelling and arthralgias.  Skin: Negative for rash and wound.  Neurological: Negative for seizures, syncope and headaches.  Hematological: Negative for adenopathy. Does not bruise/bleed easily.  Psychiatric/Behavioral: Negative for confusion.   Menarche 14 1st pregnancy - 16 Breast feed - no Hormones -  no Still has her ovaries  Blood pressure 122/64, pulse 72, temperature 97.7 F (36.5 C), temperature source Temporal, resp. rate 20, height 5\' 5"  (1.651 m), weight 169 lb 3.2 oz (76.749 kg), last menstrual period 09/14/2006.  Physical Exam Physical Exam WDWN in NAD Breasts - symmetric; no palpable masses; no palpable lymphadenopathy Unable to express any discharge from either nipple  Data Reviewed Mammogram/ ductogram 10/13  Assessment    Single episode of clear discharge - right nipple, with deep tiny 4th-order filling defect.      Plan    The likelihood of finding the duct  orifice would be very low, since she is no longer having any nipple discharge.  Rather than doing a blind nipple duct excision, we will recheck her in three months to see if she is having any more discharge.  She agrees with this plan.  Wilmon Arms. Corliss Skains, MD, [facility]  05/05/2012 11:03 AM        [name] K. 05/05/2012, 10:57 AM

## 2012-06-29 NOTE — Addendum Note (Signed)
Addended by: Neomia Dear on: 06/29/2012 07:22 PM   Modules accepted: Orders

## 2012-08-04 ENCOUNTER — Ambulatory Visit (INDEPENDENT_AMBULATORY_CARE_PROVIDER_SITE_OTHER): Payer: Medicare HMO | Admitting: Surgery

## 2012-08-11 ENCOUNTER — Ambulatory Visit (INDEPENDENT_AMBULATORY_CARE_PROVIDER_SITE_OTHER): Payer: Medicare HMO | Admitting: Surgery

## 2012-08-19 ENCOUNTER — Encounter (INDEPENDENT_AMBULATORY_CARE_PROVIDER_SITE_OTHER): Payer: Self-pay | Admitting: Surgery

## 2012-09-13 ENCOUNTER — Emergency Department (HOSPITAL_COMMUNITY)
Admission: EM | Admit: 2012-09-13 | Discharge: 2012-09-13 | Disposition: A | Discharge status: Home / Self Care | Payer: Medicaid Other | Attending: Emergency Medicine | Admitting: Emergency Medicine

## 2012-09-13 ENCOUNTER — Encounter (HOSPITAL_COMMUNITY): Payer: Self-pay | Admitting: Family Medicine

## 2012-09-13 DIAGNOSIS — Z87891 Personal history of nicotine dependence: Secondary | ICD-10-CM | POA: Insufficient documentation

## 2012-09-13 DIAGNOSIS — F3289 Other specified depressive episodes: Secondary | ICD-10-CM | POA: Insufficient documentation

## 2012-09-13 DIAGNOSIS — L0291 Cutaneous abscess, unspecified: Secondary | ICD-10-CM

## 2012-09-13 DIAGNOSIS — F1010 Alcohol abuse, uncomplicated: Secondary | ICD-10-CM | POA: Insufficient documentation

## 2012-09-13 DIAGNOSIS — IMO0001 Reserved for inherently not codable concepts without codable children: Secondary | ICD-10-CM | POA: Insufficient documentation

## 2012-09-13 DIAGNOSIS — F329 Major depressive disorder, single episode, unspecified: Secondary | ICD-10-CM | POA: Insufficient documentation

## 2012-09-13 DIAGNOSIS — Z79899 Other long term (current) drug therapy: Secondary | ICD-10-CM | POA: Insufficient documentation

## 2012-09-13 DIAGNOSIS — L02219 Cutaneous abscess of trunk, unspecified: Secondary | ICD-10-CM | POA: Insufficient documentation

## 2012-09-13 DIAGNOSIS — G5600 Carpal tunnel syndrome, unspecified upper limb: Secondary | ICD-10-CM | POA: Insufficient documentation

## 2012-09-13 DIAGNOSIS — E7800 Pure hypercholesterolemia, unspecified: Secondary | ICD-10-CM | POA: Insufficient documentation

## 2012-09-13 DIAGNOSIS — M129 Arthropathy, unspecified: Secondary | ICD-10-CM | POA: Insufficient documentation

## 2012-09-13 NOTE — ED Provider Notes (Signed)
History     CSN: [id number]  Arrival date & time 09/13/12  1122   First MD Initiated Contact with Patient 09/13/12 1335      Chief Complaint  Patient presents with  . Abscess    (Consider location/radiation/quality/duration/timing/severity/associated sxs/prior treatment) Patient is a 52 y.o. female presenting with abscess. The history is provided by the patient.  Abscess Abscess location: lower abdomen. Abscess quality: fluctuance, painful and redness   Abscess quality: not draining, no induration and not weeping   Red streaking: no   Duration:  1 week Progression:  Worsening Pain details:    Quality: tenderness.   Timing:  Constant   Progression:  Worsening Context: not diabetes, not immunosuppression and not skin injury   Relieved by:  None tried Ineffective treatments:  None tried Associated symptoms: no fever, no nausea and no vomiting   Risk factors: prior abscess     Past Medical History  Diagnosis Date  . Depression   . Fibromyalgia   . Carpal tunnel syndrome   . Alcohol abuse   . High cholesterol   . Arthritis     Past Surgical History  Procedure Laterality Date  . Abdominal hysterectomy  2008    h/o uterine fibroids, ?partial hysterectomy per pt 04/13/12  . Colonoscopy      Family History  Problem Relation Age of Onset  . Diabetes Mother   . Hypertension Mother   . Stroke Mother   . Diabetes Sister   . Thyroid disease Sister   . Hypertension Sister   . Cancer Maternal Aunt     unknown    History  Substance Use Topics  . Smoking status: Former Smoker    Quit date: 06/03/2000  . Smokeless tobacco: Never Used  . Alcohol Use: Yes    OB History   Grav Para Term Preterm Abortions TAB SAB Ect Mult Living                  Review of Systems  Constitutional: Negative for fever.  Gastrointestinal: Negative for nausea and vomiting.  Skin:       abscess  All other systems reviewed and are negative.    Allergies  Review of patient's  allergies indicates no known allergies.  Home Medications   Current Outpatient Rx  Name  Route  Sig  Dispense  Refill  . pantoprazole (PROTONIX) 20 MG tablet   Oral   Take 1 tablet (20 mg total) by mouth daily.   30 tablet   3   . QUEtiapine (SEROQUEL XR) 200 MG 24 hr tablet   Oral   Take 200 mg by mouth at bedtime.           . sertraline (ZOLOFT) 25 MG tablet   Oral   Take 50 mg by mouth daily.           . simvastatin (ZOCOR) 40 MG tablet   Oral   Take 1 tablet (40 mg total) by mouth at bedtime.   30 tablet   3     BP 114/83  Pulse 98  Temp(Src) 98.7 F (37.1 C) (Oral)  Resp 22  SpO2 98%  LMP 09/14/2006  Physical Exam  Nursing note and vitals reviewed. Constitutional: She appears well-developed and well-nourished. No distress.  HENT:  Head: Normocephalic and atraumatic.  Mouth/Throat: Oropharynx is clear and moist.  Cardiovascular: Normal rate, regular rhythm and normal heart sounds.   Pulmonary/Chest: Effort normal and breath sounds normal.  Neurological: She is alert.  Skin: Skin is warm and dry. She is not diaphoretic.  Approximately 1.5-2 cm diameter fluctuant abscess of the lower abdomen.  No surrounding erythema, induration, or warmth.  No drainage.   Psychiatric: She has a normal mood and affect.    ED Course  Procedures (including critical care time)  Labs Reviewed - No data to display No results found.   No diagnosis found.  INCISION AND DRAINAGE Performed by: Anne Shutter, Sopheap Boehle Consent: Verbal consent obtained. Risks and benefits: risks, benefits and alternatives were discussed Type: abscess  Body area: lower abdomen  Anesthesia: local infiltration  Incision was made with a scalpel.  Local anesthetic: lidocaine 2% with epinephrine  Anesthetic total: 5 ml  Complexity: complex Blunt dissection to break up loculations  Drainage: purulent  Drainage amount: moderate  Patient tolerance: Patient tolerated the procedure well  with no immediate complications.     MDM  Patient with skin abscess amenable to incision and drainage.  Abscess was not large enough to warrant packing or drain,  wound recheck in 2 days. Encouraged home warm compresses.  No signs of cellulitis is surrounding skin.  Will d/c to home.  No antibiotic therapy is indicated.         Pascal Lux [place], PA-C 09/13/12 2235  Pascal Lux [place], PA-C 09/13/12 2241

## 2012-09-13 NOTE — ED Notes (Signed)
sts abscess on lower abdomen. sts pain, swelling. Denies drainage.

## 2012-09-13 NOTE — ED Notes (Signed)
PA at bedside for I and D

## 2012-09-16 NOTE — ED Provider Notes (Signed)
Medical screening examination/treatment/procedure(s) were performed by non-physician practitioner and as supervising physician I was immediately available for consultation/collaboration.   Kaly Mcquary Y. Myrtle Haller, MD 09/16/12 1212 

## 2012-09-23 ENCOUNTER — Encounter: Payer: Self-pay | Admitting: Internal Medicine

## 2012-09-23 ENCOUNTER — Ambulatory Visit (INDEPENDENT_AMBULATORY_CARE_PROVIDER_SITE_OTHER): Payer: Medicaid Other | Admitting: Internal Medicine

## 2012-09-23 VITALS — BP 115/74 | HR 60 | Temp 97.3°F | Ht 65.0 in | Wt 176.8 lb

## 2012-09-23 DIAGNOSIS — L0291 Cutaneous abscess, unspecified: Secondary | ICD-10-CM

## 2012-09-23 DIAGNOSIS — J309 Allergic rhinitis, unspecified: Secondary | ICD-10-CM

## 2012-09-23 DIAGNOSIS — J302 Other seasonal allergic rhinitis: Secondary | ICD-10-CM | POA: Insufficient documentation

## 2012-09-23 DIAGNOSIS — L0390 Cellulitis, unspecified: Secondary | ICD-10-CM

## 2012-09-23 MED ORDER — CETIRIZINE HCL 10 MG PO CAPS: 10.0000 mg | ORAL_CAPSULE | Freq: Every day | ORAL | Status: DC

## 2012-09-23 NOTE — Patient Instructions (Addendum)
**  Keep your skin clean by bathing every day. Try to keep your skin dry- you can use powder on your skin, especially in the skin folds. Try to keep your belt buckle and pants zipper and button away from your skin. You may have a nickel allergy.  **Please call the clinic in 1 week and let us know how you are doing. If your skin does not improve, you may need an antibiotic.  **For your allergies, restart the Claritin-D, one tablet daily.

## 2012-09-26 NOTE — Progress Notes (Signed)
Patient ID: Theresa Kirk, female   DOB: 06/27/1960, 52 y.o.   MRN: [id number]  Subjective:   Patient ID: Theresa Kirk female   DOB: 1960/07/31 52 y.o.   MRN: [id number]  HPI: Theresa Kirk is a 52 y.o. F with PMH depression, fibromyalgia, chronic pain, and previous skin abscesses presents to the clinic for an ED f/u.  She was seen in the ED on 4/13 with a large amount abscess just inferior to her umbilicus that was fluctuant, painful, and erythematous, but nondraining. An I&D was performed, and she was discharged home from the ED. She was seen in the clinic on 4/23 for an ED follow up. She states that the abscess site was healing well but has improved since the I&D. She states that she feels like a zipper on her jeans causes irritation of her skin. However now she had a new raised area with tenderness in her right inguinal region , and another site in her right gluteal fold is also mildly tender. This area had been present for one to 2 days She is concerned this will develop into abscesses as well. She states she's history of skin abscesses, and she frequently develops areas of induration that usually resolve after a few days. The sites are usually and warm moist areas of her skin. She denies any history of diabetes. She denies any fevers or chills.  She also complains of problems with the pollen causing headaches and is requesting an antihistamine.  Past Medical History  Diagnosis Date  . Depression   . Fibromyalgia   . Carpal tunnel syndrome   . Alcohol abuse   . High cholesterol   . Arthritis    Current Outpatient Prescriptions  Medication Sig Dispense Refill  . pantoprazole (PROTONIX) 20 MG tablet Take 1 tablet (20 mg total) by mouth daily.  30 tablet  3  . QUEtiapine (SEROQUEL XR) 200 MG 24 hr tablet Take 200 mg by mouth at bedtime.        . sertraline (ZOLOFT) 25 MG tablet Take 50 mg by mouth daily.        . simvastatin (ZOCOR) 40 MG tablet Take 1 tablet (40 mg total) by  mouth at bedtime.  30 tablet  3  . Cetirizine HCl 10 MG CAPS Take 1 capsule (10 mg total) by mouth daily.  30 capsule  6   No current facility-administered medications for this visit.   Family History  Problem Relation Age of Onset  . Diabetes Mother   . Hypertension Mother   . Stroke Mother   . Diabetes Sister   . Thyroid disease Sister   . Hypertension Sister   . Cancer Maternal Aunt     unknown   History   Social History  . Marital Status: Single    Spouse Name: N/A    Number of Children: N/A  . Years of Education: N/A   Social History Main Topics  . Smoking status: Former Smoker    Quit date: 06/03/2000  . Smokeless tobacco: Never Used  . Alcohol Use: Yes  . Drug Use: Yes    Special: Marijuana  . Sexually Active: Yes -- Female partner(s)    Birth Control/ Protection: None   Other Topics Concern  . None   Social History Narrative   Lives with mother in [place].   Currently unemployed.   Used to work at [employer] in [place].   ETOH use: weekend, 1-2 beers from [employer]: used to drink everyday   Former smoker  Current THC use   H/o cocaine use: quit in 2001.   Review of Systems: A 10 point ROS was performed; pertinent positives and negatives were noted in the HPI   Objective:  Physical Exam: Filed Vitals:   09/23/12 0843  BP: 115/74  Pulse: 60  Temp: 97.3 F (36.3 C)  TempSrc: Oral  Height: 5\' 5"  (1.651 m)  Weight: 176 lb 12.8 oz (80.196 kg)  SpO2: 100%   Constitutional: Vital signs reviewed.  Patient is a well-developed and well-nourished female in no acute distress and cooperative with exam.  HEENT: Normocephalic and atraumatic, PERRL, EOMI, conjunctivae normal, No scleral icterus. Neck supple. Cardiovascular: RRR, no MRG, pulses symmetric and intact bilaterally Pulmonary/Chest: CTAB, no wheezes, rales, or rhonchi Abdominal: Soft. Non-tender, non-distended, no masses, organomegaly, or guarding present, well healing area, inferior to umbilicus,  from previous I&D with mild induration, no erythema or fluctuance, mild TTP GU: Area of induration in right inguinal region, no fluctuance or erythema, mild TTP; in right gluteal fold, small area of induration, no fluctuance or erythema, mild TTP  Musculoskeletal: No joint deformities, erythema, or stiffness Neurological: A&O x3, nonfocal   Psychiatric: Normal mood and affect.  Assessment & Plan:   Please refer to Problem List based Assessment and Plan

## 2012-09-26 NOTE — Assessment & Plan Note (Addendum)
States she has headaches off and on with a pollen, and is requesting an antihistamine. Starting cetirizine. - Cetirizine 10 mg daily

## 2012-09-26 NOTE — Assessment & Plan Note (Signed)
The lower abdominal abscess that was I&D is healing well. Minimal induration present; no fluctuance or erythema. At the other areas of induration present in her right inguinal fold and right gluteal fold, there is no fluctuance or erythema. Given that the site on her lower abdomen is healing well, and, per patient, when she does develop areas of induration they resolve within a few days, and that she has no erythema or fluctuance present, we will watch the sites for now. She's to call the clinic within one week, and if the sites have not resolved, then she will need to be reevaluated.

## 2012-10-05 NOTE — Progress Notes (Signed)
INTERNAL MEDICINE TEACHING ATTENDING ADDENDUM - Inez Catalina, MD: I reviewed with the resident Dr. Sherrine Maples, Ms. Traore'  medical history, physical examination, diagnosis and results of tests and treatment and I agree with the patient's care as documented.

## 2012-12-09 ENCOUNTER — Ambulatory Visit (INDEPENDENT_AMBULATORY_CARE_PROVIDER_SITE_OTHER): Payer: Medicare HMO | Admitting: Internal Medicine

## 2012-12-09 ENCOUNTER — Encounter: Payer: Self-pay | Admitting: Internal Medicine

## 2012-12-09 ENCOUNTER — Other Ambulatory Visit (HOSPITAL_COMMUNITY)
Admission: RE | Admit: 2012-12-09 | Discharge: 2012-12-09 | Disposition: A | Discharge status: Home / Self Care | Payer: Medicare HMO | Source: Ambulatory Visit | Attending: Internal Medicine | Admitting: Internal Medicine

## 2012-12-09 VITALS — BP 115/80 | HR 66 | Temp 97.4°F | Ht 65.5 in | Wt 176.1 lb

## 2012-12-09 DIAGNOSIS — R053 Chronic cough: Secondary | ICD-10-CM | POA: Insufficient documentation

## 2012-12-09 DIAGNOSIS — R0789 Other chest pain: Secondary | ICD-10-CM | POA: Insufficient documentation

## 2012-12-09 DIAGNOSIS — N898 Other specified noninflammatory disorders of vagina: Secondary | ICD-10-CM

## 2012-12-09 DIAGNOSIS — R05 Cough: Secondary | ICD-10-CM

## 2012-12-09 DIAGNOSIS — N760 Acute vaginitis: Secondary | ICD-10-CM | POA: Insufficient documentation

## 2012-12-09 DIAGNOSIS — J302 Other seasonal allergic rhinitis: Secondary | ICD-10-CM

## 2012-12-09 DIAGNOSIS — R51 Headache: Secondary | ICD-10-CM

## 2012-12-09 DIAGNOSIS — Z113 Encounter for screening for infections with a predominantly sexual mode of transmission: Secondary | ICD-10-CM | POA: Insufficient documentation

## 2012-12-09 DIAGNOSIS — J309 Allergic rhinitis, unspecified: Secondary | ICD-10-CM

## 2012-12-09 DIAGNOSIS — R059 Cough, unspecified: Secondary | ICD-10-CM

## 2012-12-09 MED ORDER — SUMATRIPTAN SUCCINATE 25 MG PO TABS: 25.0000 mg | ORAL_TABLET | ORAL | Status: DC | PRN

## 2012-12-09 MED ORDER — CETIRIZINE HCL 10 MG PO CAPS: 10.0000 mg | ORAL_CAPSULE | Freq: Every day | ORAL | Status: DC

## 2012-12-09 NOTE — Assessment & Plan Note (Signed)
Pelvic done today with affirm and GC urine  Will check UA to r/o UTI

## 2012-12-09 NOTE — Progress Notes (Signed)
  Subjective:    Patient ID: Theresa Kirk, female    DOB: [birth year], 52 y.o.   MRN: [id number]  HPI Comments: 52 y.o PMH (BP is 115/80 today) chronic h/a, etc.  Seh presents for vaginal discharge and odor x 2-3 weeks noticed since having sex with condom and condom breaking. She denies her partner having sx's. Discharge color is dark green strong smell ?yeast smell.  She has had vaginal itching as well.  She denies ab pain but has had some dysuria.  Denies fever, ?chills.    She also has had cough x 2 month with thick yellow mucous at one point was Rx Zyrtec but did not pick up Rx from pharmacy.  She denies sick contacts.    She has chronic h/a and needs Rx refill of Imitrex.    She c/o of left arm/shoulder pain radiating to chest. Today 0/10.  Hurts to breath and feels like a cramp. Pain can be 10/10 last 15 min and goes away. This started early 12/2012 and sitting down and resting helps. Pain is worse with movement. At times arm throbs. She takes 4-5 Aleves with relief but has ab pain when taking Aleve.    SH: smokes THC sometimes daily.      Review of Systems  Respiratory: Positive for shortness of breath.        Sob intermittently with rest and exertion       Objective:   Physical Exam  Nursing note and vitals reviewed. Constitutional: She is oriented to person, place, and time. Vital signs are normal. She appears well-developed and well-nourished. She is cooperative. No distress.  HENT:  Head: Normocephalic and atraumatic.  Mouth/Throat: Oropharynx is clear and moist and mucous membranes are normal. No oropharyngeal exudate.  Eyes: Conjunctivae are normal. Pupils are equal, round, and reactive to light. Right eye exhibits no discharge. Left eye exhibits no discharge. No scleral icterus.  Cardiovascular: Normal rate, regular rhythm, S1 normal, S2 normal and normal heart sounds.   No murmur heard. Pulmonary/Chest: Effort normal and breath sounds normal. No respiratory distress.  She has no wheezes.    Abdominal: Soft. Bowel sounds are normal. There is no tenderness.  Obese ab  Genitourinary:  No cervix  Thick white discharge  No lesions  Musculoskeletal:       Left shoulder: She exhibits no tenderness.  Neurological: She is alert and oriented to person, place, and time. Gait normal.  Skin: Skin is warm, dry and intact. No rash noted. She is not diaphoretic.  Psychiatric: She has a normal mood and affect. Her speech is normal and behavior is normal. Judgment and thought content normal. Cognition and memory are normal.          Assessment & Plan:  F/u 1 month if cough not resolved, sooner if needed  Take Zyrtec and PPI for cough. Consider imaging if not resolving in 1 month Take Advil for pain

## 2012-12-09 NOTE — Assessment & Plan Note (Signed)
Possibly allergic, GERD related.  She is a smoker (i.e  THC) could be related to chronic bronchitis Will try Zyrtec, PPI If not resolved or has systemic sx's will consider imaging and further w/u

## 2012-12-09 NOTE — Assessment & Plan Note (Signed)
Left Shoulder pain that radiates to left chest and relieved with Advil. Not present today but is reproducible on exam. Etiology could be MSK Advised to revisit clinic or ED if returns Can take advil for pain

## 2012-12-09 NOTE — Patient Instructions (Addendum)
Please come to clinic or see the Emergency Department if the pain comes back in your left chest/shoulder I will send a letter or call you with result of your pelvic exam Take Advil as needed for pain not to go over daily dose  Pick up medications from pharmacy F/u with your doctor in 1 month if cough not resolved or you develop fever   Shoulder Pain The shoulder is the joint that connects your arms to your body. The bones that form the shoulder joint include the upper arm bone (humerus), the shoulder blade (scapula), and the collarbone (clavicle). The top of the humerus is shaped like a ball and fits into a rather flat socket on the scapula (glenoid cavity). A combination of muscles and strong, fibrous tissues that connect muscles to bones (tendons) support your shoulder joint and hold the ball in the socket. Small, fluid-filled sacs (bursae) are located in different areas of the joint. They act as cushions between the bones and the overlying soft tissues and help reduce friction between the gliding tendons and the bone as you move your arm. Your shoulder joint allows a wide range of motion in your arm. This range of motion allows you to do things like scratch your back or throw a ball. However, this range of motion also makes your shoulder more prone to pain from overuse and injury. Causes of shoulder pain can originate from both injury and overuse and usually can be grouped in the following four categories:  Redness, swelling, and pain (inflammation) of the tendon (tendinitis) or the bursae (bursitis).  Instability, such as a dislocation of the joint.  Inflammation of the joint (arthritis).  Broken bone (fracture). HOME CARE INSTRUCTIONS   Apply ice to the sore area.  Put ice in a plastic bag.  Place a towel between your skin and the bag.  Leave the ice on for 15-20 minutes, 3-4 times per day for the first 2 days.  Stop using cold packs if they do not help with the pain.  If you have a  shoulder sling or immobilizer, wear it as long as your caregiver instructs. Only remove it to shower or bathe. Move your arm as little as possible, but keep your hand moving to prevent swelling.  Squeeze a soft ball or foam pad as much as possible to help prevent swelling.  Only take over-the-counter or prescription medicines for pain, discomfort, or fever as directed by your caregiver. SEEK MEDICAL CARE IF:   Your shoulder pain increases, or new pain develops in your arm, hand, or fingers.  Your hand or fingers become cold and numb.  Your pain is not relieved with medicines. SEEK IMMEDIATE MEDICAL CARE IF:   Your arm, hand, or fingers are numb or tingling.  Your arm, hand, or fingers are significantly swollen or turn white or blue. MAKE SURE YOU:   Understand these instructions.  Will watch your condition.  Will get help right away if you are not doing well or get worse. Document Released: 02/27/2005 Document Revised: 02/12/2012 Document Reviewed: 05/04/2011 [facility] Patient Information 2014 [place], [place].  Chest Pain (Nonspecific) It is often hard to give a specific diagnosis for the cause of chest pain. There is always a chance that your pain could be related to something serious, such as a heart attack or a blood clot in the lungs. You need to follow up with your caregiver for further evaluation. CAUSES   Heartburn.  Pneumonia or bronchitis.  Anxiety or stress.  Inflammation  around your heart (pericarditis) or lung (pleuritis or pleurisy).  A blood clot in the lung.  A collapsed lung (pneumothorax). It can develop suddenly on its own (spontaneous pneumothorax) or from injury (trauma) to the chest.  Shingles infection (herpes zoster virus). The chest wall is composed of bones, muscles, and cartilage. Any of these can be the source of the pain.  The bones can be bruised by injury.  The muscles or cartilage can be strained by coughing or overwork.  The cartilage  can be affected by inflammation and become sore (costochondritis). DIAGNOSIS  Lab tests or other studies, such as X-rays, electrocardiography, stress testing, or cardiac imaging, may be needed to find the cause of your pain.  TREATMENT   Treatment depends on what may be causing your chest pain. Treatment may include:  Acid blockers for heartburn.  Anti-inflammatory medicine.  Pain medicine for inflammatory conditions.  Antibiotics if an infection is present.  You may be advised to change lifestyle habits. This includes stopping smoking and avoiding alcohol, caffeine, and chocolate.  You may be advised to keep your head raised (elevated) when sleeping. This reduces the chance of acid going backward from your stomach into your esophagus.  Most of the time, nonspecific chest pain will improve within 2 to 3 days with rest and mild pain medicine. HOME CARE INSTRUCTIONS   If antibiotics were prescribed, take your antibiotics as directed. Finish them even if you start to feel better.  For the next few days, avoid physical activities that bring on chest pain. Continue physical activities as directed.  Do not smoke.  Avoid drinking alcohol.  Only take over-the-counter or prescription medicine for pain, discomfort, or fever as directed by your caregiver.  Follow your caregiver's suggestions for further testing if your chest pain does not go away.  Keep any follow-up appointments you made. If you do not go to an appointment, you could develop lasting (chronic) problems with pain. If there is any problem keeping an appointment, you must call to reschedule. SEEK MEDICAL CARE IF:   You think you are having problems from the medicine you are taking. Read your medicine instructions carefully.  Your chest pain does not go away, even after treatment.  You develop a rash with blisters on your chest. SEEK IMMEDIATE MEDICAL CARE IF:   You have increased chest pain or pain that spreads to your  arm, neck, jaw, back, or abdomen.  You develop shortness of breath, an increasing cough, or you are coughing up blood.  You have severe back or abdominal pain, feel nauseous, or vomit.  You develop severe weakness, fainting, or chills.  You have a fever. THIS IS AN EMERGENCY. Do not wait to see if the pain will go away. Get medical help at once. Call your local emergency services (911 in [place] Do not drive yourself to the hospital. MAKE SURE YOU:   Understand these instructions.  Will watch your condition.  Will get help right away if you are not doing well or get worse. Document Released: 02/27/2005 Document Revised: 08/12/2011 Document Reviewed: 12/24/2007 [facility] Patient Information 2014 [place], [place].  Cough, Adult  A cough is a reflex that helps clear your throat and airways. It can help heal the body or may be a reaction to an irritated airway. A cough may only last 2 or 3 weeks (acute) or may last more than 8 weeks (chronic).  CAUSES Acute cough:  Viral or bacterial infections. Chronic cough:  Infections.  Allergies.  Asthma.  Post-nasal drip.  Smoking.  Heartburn or acid reflux.  Some medicines.  Chronic lung problems (COPD).  Cancer. SYMPTOMS   Cough.  Fever.  Chest pain.  Increased breathing rate.  High-pitched whistling sound when breathing (wheezing).  Colored mucus that you cough up (sputum). TREATMENT   A bacterial cough may be treated with antibiotic medicine.  A viral cough must run its course and will not respond to antibiotics.  Your caregiver may recommend other treatments if you have a chronic cough. HOME CARE INSTRUCTIONS   Only take over-the-counter or prescription medicines for pain, discomfort, or fever as directed by your caregiver. Use cough suppressants only as directed by your caregiver.  Use a cold steam vaporizer or humidifier in your bedroom or home to help loosen secretions.  Sleep in a semi-upright position if  your cough is worse at night.  Rest as needed.  Stop smoking if you smoke. SEEK IMMEDIATE MEDICAL CARE IF:   You have pus in your sputum.  Your cough starts to worsen.  You cannot control your cough with suppressants and are losing sleep.  You begin coughing up blood.  You have difficulty breathing.  You develop pain which is getting worse or is uncontrolled with medicine.  You have a fever. MAKE SURE YOU:   Understand these instructions.  Will watch your condition.  Will get help right away if you are not doing well or get worse. Document Released: 11/16/2010 Document Revised: 08/12/2011 Document Reviewed: 11/16/2010 [facility] Patient Information 2014 [place], [place].

## 2012-12-09 NOTE — Assessment & Plan Note (Signed)
Rx refill Imitrex

## 2012-12-10 ENCOUNTER — Other Ambulatory Visit: Payer: Self-pay | Admitting: Internal Medicine

## 2012-12-10 ENCOUNTER — Encounter: Payer: Self-pay | Admitting: Internal Medicine

## 2012-12-10 LAB — URINALYSIS, ROUTINE W REFLEX MICROSCOPIC
Hgb urine dipstick: NEGATIVE
Leukocytes, UA: NEGATIVE
Nitrite: NEGATIVE
Specific Gravity, Urine: 1.016 (ref 1.005–1.030)
pH: 5 (ref 5.0–8.0)

## 2012-12-10 MED ORDER — FLUCONAZOLE 150 MG PO TABS: 150.0000 mg | ORAL_TABLET | Freq: Once | ORAL | Status: DC

## 2012-12-10 MED ORDER — METRONIDAZOLE 500 MG PO TABS: 500.0000 mg | ORAL_TABLET | Freq: Two times a day (BID) | ORAL | Status: DC

## 2012-12-10 NOTE — Progress Notes (Signed)
Case discussed with Dr. McLean at the time of the visit.  We reviewed the resident's history and exam and pertinent patient test results.  I agree with the assessment, diagnosis, and plan of care documented in the resident's note.     

## 2013-02-10 ENCOUNTER — Ambulatory Visit (INDEPENDENT_AMBULATORY_CARE_PROVIDER_SITE_OTHER): Payer: Medicare HMO | Admitting: Internal Medicine

## 2013-02-10 ENCOUNTER — Ambulatory Visit (HOSPITAL_COMMUNITY)
Admission: RE | Admit: 2013-02-10 | Discharge: 2013-02-10 | Disposition: A | Discharge status: Home / Self Care | Payer: Medicare HMO | Source: Ambulatory Visit | Attending: Internal Medicine | Admitting: Internal Medicine

## 2013-02-10 ENCOUNTER — Other Ambulatory Visit: Payer: Self-pay | Admitting: Internal Medicine

## 2013-02-10 ENCOUNTER — Encounter: Payer: Self-pay | Admitting: Internal Medicine

## 2013-02-10 VITALS — BP 115/75 | HR 81 | Temp 97.7°F | Ht 65.0 in | Wt 174.9 lb

## 2013-02-10 DIAGNOSIS — Z0000 Encounter for general adult medical examination without abnormal findings: Secondary | ICD-10-CM

## 2013-02-10 DIAGNOSIS — J029 Acute pharyngitis, unspecified: Secondary | ICD-10-CM | POA: Insufficient documentation

## 2013-02-10 DIAGNOSIS — M25562 Pain in left knee: Secondary | ICD-10-CM | POA: Insufficient documentation

## 2013-02-10 DIAGNOSIS — M25569 Pain in unspecified knee: Secondary | ICD-10-CM

## 2013-02-10 DIAGNOSIS — M25469 Effusion, unspecified knee: Secondary | ICD-10-CM | POA: Insufficient documentation

## 2013-02-10 LAB — RAPID STREP SCREEN (MED CTR MEBANE ONLY): Streptococcus, Group A Screen (Direct): NEGATIVE

## 2013-02-10 NOTE — Patient Instructions (Signed)
1. Please schedule a follow up appointment for about 4 weeks.   I will call you with the results of your knee X-ray and throat culture results. If positive, we will start a course of antibiotics.   2. Please take all medications as prescribed.  3. If you have worsening of your symptoms or new symptoms arise, please call the clinic ([phone]), or go to the ER immediately if symptoms are severe.  You have done great job in taking all your medications. I appreciate it very much. Please continue doing that.

## 2013-02-10 NOTE — Assessment & Plan Note (Signed)
On exam, the left knee is tender to palpation superior and lateral to the patella, while the joint space is not tender. There does appear to me a mild effusion superior to the patella as well. No instability is noted in the knee joint.  -XR of right knee 4V showed no acute fracture or subluxation. Mild degenerative changes w/ small joint effusion.  -Will have patient follow up in 1-2 weeks for further management.

## 2013-02-10 NOTE — Assessment & Plan Note (Signed)
On exam, the oropharynx appears slightly swollen with erythema and also a possible exudate noted on the right tonsil. Patient is afebrile at this time. Anterior cervical tender lymphadenopathy is present.  -Rapid Strep A swab -ve; sent for reflex culture, results pending. -Will call patient to discuss further symptomatic treatment of viral pharyngitis.

## 2013-02-10 NOTE — Assessment & Plan Note (Signed)
Gave patient OB/GYN referral today for management of any further GYN issues as per her request. Patient has h/o hysterectomy.  Gave flu shot today.

## 2013-02-10 NOTE — Progress Notes (Signed)
Subjective:   Patient ID: Theresa Kirk female   DOB: [birth year] 52 y.o.   MRN: [id number]  HPI: [name] is a 52 y.o. F w/ PMHx of Depression, HLD, and Fibromyalgia, presents today for complaints of sore throat and left knee pain. She says the sore throat has been present for about 2 weeks and originally was accompanied by minor body aches and pains and chills. She reports no recent sick contacts. She claims she had only a minor, very infrequent cough with the sore throat. She claims she took some cough drops and this relieved her discomfort for some time, but then the soreness returned. She now reports some pain in her ears and throat with swallowing. She also claims she has had some lymph node swelling that she has noticed on her neck. On exam, the oropharynx appears slightly swollen with erythema and also a possible exudate noted on the right tonsil. Patient is afebrile at this time. Anterior cervical tender lymphadenopathy is present.   The knee pain she claims has been present for about 2 weeks after she rolled her left ankle when walking. She claims the knee is painful with gait and swelled up significantly for some time. She has used rest and a heating pad to relived the pain thus far. On exam, the left knee is tender to palpation superior and lateral to the patella, while the joint space is not tender. There does appear to me a mild effusion superior to the patella as well. No instability is noted in the knee joint.   Otherwise, the patient denies any further complaints. She did ask for a gynecology referral as she had previously been seen for some vaginal discharge in the [facility] in July and was found to have BV and yeast infection at that time. The patient is s/p hysterectomy in 2008, but would like to follow with a gynecologist. She denies any further vaginal discharge or foul odor. She also denies any urinary symptoms, diarrhea, or abdominal pain.  Past Medical History  Diagnosis  Date  . Depression   . Fibromyalgia   . Carpal tunnel syndrome   . Alcohol abuse   . High cholesterol   . Arthritis    Current Outpatient Prescriptions  Medication Sig Dispense Refill  . Cetirizine HCl 10 MG CAPS Take 1 capsule (10 mg total) by mouth daily.  30 capsule  6  . fluconazole (DIFLUCAN) 150 MG tablet Take 1 tablet (150 mg total) by mouth once.  1 tablet  0  . metroNIDAZOLE (FLAGYL) 500 MG tablet Take 1 tablet (500 mg total) by mouth 2 (two) times daily.  14 tablet  0  . pantoprazole (PROTONIX) 20 MG tablet Take 1 tablet (20 mg total) by mouth daily.  30 tablet  3  . QUEtiapine (SEROQUEL XR) 200 MG 24 hr tablet Take 200 mg by mouth at bedtime.        . sertraline (ZOLOFT) 25 MG tablet Take 100 mg by mouth 2 (two) times daily.       . simvastatin (ZOCOR) 40 MG tablet Take 1 tablet (40 mg total) by mouth at bedtime.  30 tablet  3  . SUMAtriptan (IMITREX) 25 MG tablet Take 1 tablet (25 mg total) by mouth every 2 (two) hours as needed for migraine.  10 tablet  0   No current facility-administered medications for this visit.   Family History  Problem Relation Age of Onset  . Diabetes Mother   . Hypertension Mother   .  Stroke Mother   . Diabetes Sister   . Thyroid disease Sister   . Hypertension Sister   . Cancer Maternal Aunt     unknown   History   Social History  . Marital Status: Single    Spouse Name: N/A    Number of Children: N/A  . Years of Education: N/A   Social History Main Topics  . Smoking status: Former Smoker    Quit date: 06/03/2000  . Smokeless tobacco: Never Used  . Alcohol Use: Yes  . Drug Use: Yes    Special: Marijuana  . Sexual Activity: Yes    Partners: Male    Birth Control/ Protection: Condom   Other Topics Concern  . None   Social History Narrative   Lives with mother in [place].   Currently unemployed.   Used to work at [employer] in [place].   ETOH use: weekend, 1-2 beers from [employer]: used to drink everyday   Former smoker    Current THC use   H/o cocaine use: quit in 2001.   Review of Systems: General: Denies fever, chills, diaphoresis, appetite change and fatigue.  HEENT: Positive for sore throat (2 weeks), w/ ear pain and tender lymph nodes in neck. Denies change in vision, eye pain, redness, hearing loss, congestion, rhinorrhea, sneezing, mouth sores, neck stiffness and tinnitus.   Respiratory: Denies SOB, DOE, cough, chest tightness, and wheezing.   Cardiovascular: Denies chest pain, palpitations and leg swelling.  Gastrointestinal: Denies nausea, vomiting, abdominal pain, diarrhea, constipation, blood in stool and abdominal distention.  Genitourinary: Denies dysuria, urgency, frequency, hematuria, flank pain and difficulty urinating.  Endocrine: Denies hot or cold intolerance, sweats, polyuria, polydipsia. Musculoskeletal: Positive for left knee pain and swelling, affecting her gait. Denies back pain. Skin: Denies pallor, rash and wounds.  Neurological: Denies dizziness, seizures, syncope, weakness, lightheadedness, numbness and headaches.  Psychiatric/Behavioral: Denies mood changes, confusion, nervousness, sleep disturbance and agitation.  Objective:  Physical Exam: Filed Vitals:   02/10/13 0822  BP: 115/75  Pulse: 81  Temp: 97.7 F (36.5 C)  TempSrc: Oral  Height: 5\' 5"  (1.651 m)  Weight: 174 lb 14.4 oz (79.334 kg)  SpO2: 98%   General: Vital signs reviewed.  Patient is a well-developed and well-nourished, in no acute distress and cooperative with exam. Alert and oriented x3.  Head: Normocephalic and atraumatic. Nose: No erythema or drainage noted.  Turbinates normal. Mouth: Erythema and swelling of the oropharynx. Possible right tonsillar exudate.  Eyes: PERRL, EOMI, conjunctivae normal, No scleral icterus.  Neck: Tender anterior cervical lymphadenopathy. Supple, trachea midline, normal ROM, No JVD, thyromegaly, or carotid bruit present.  Cardiovascular: RRR, S1 normal, S2 normal, no murmurs,  gallops, or rubs. Pulmonary/Chest: Normal respiratory effort, CTAB, no wheezes, rales, or rhonchi. Abdominal: Soft. Non-tender, non-distended, bowel sounds are normal, no masses, organomegaly, or guarding present.  Musculoskeletal: Left knee pain and mild effusion superior to the patella. Tender to palpation of lateral side of the knee. Pain on flexion and internal rotation on lateral side of knee. No joint deformities, erythema, or stiffness, ROM full. Extremities: Mild swelling of left knee joint. No edema,  pulses symmetric and intact bilaterally. No cyanosis or clubbing. Neurological: A&O x3, Strength is normal and symmetric bilaterally, cranial nerve II-XII are grossly intact, no focal motor deficit, sensory intact to light touch bilaterally.  Skin: Warm, dry and intact. No rashes or erythema. Psychiatric: Normal mood and affect. speech and behavior is normal. Cognition and memory are normal.   Assessment & Plan:  Please see problem-based assessment and plan.

## 2013-02-12 LAB — CULTURE, GROUP A STREP: Organism ID, Bacteria: NORMAL

## 2013-02-15 NOTE — Progress Notes (Signed)
I saw and evaluated the patient.  I personally confirmed the key portions of the history and exam documented by Dr. Jones and I reviewed pertinent patient test results.  The assessment, diagnosis, and plan were formulated together and I agree with the documentation in the resident's note.   

## 2013-03-01 ENCOUNTER — Encounter: Payer: Self-pay | Admitting: Internal Medicine

## 2013-03-01 ENCOUNTER — Ambulatory Visit (INDEPENDENT_AMBULATORY_CARE_PROVIDER_SITE_OTHER): Payer: Medicare HMO | Admitting: Internal Medicine

## 2013-03-01 VITALS — BP 114/75 | HR 80 | Temp 97.6°F | Ht 65.0 in | Wt 174.4 lb

## 2013-03-01 DIAGNOSIS — J029 Acute pharyngitis, unspecified: Secondary | ICD-10-CM

## 2013-03-01 DIAGNOSIS — M25562 Pain in left knee: Secondary | ICD-10-CM

## 2013-03-01 DIAGNOSIS — E785 Hyperlipidemia, unspecified: Secondary | ICD-10-CM

## 2013-03-01 DIAGNOSIS — M25569 Pain in unspecified knee: Secondary | ICD-10-CM

## 2013-03-01 LAB — LIPID PANEL
HDL: 39 mg/dL — ABNORMAL LOW (ref >39)
Triglycerides: 117 mg/dL (ref <150)

## 2013-03-01 NOTE — Progress Notes (Addendum)
I saw and evaluated the patient.  Knee Exam (Left): Shows mild fluctuant swelling on the superolateral border. Tenderness on movement positive. Medial and lateral Grind test negative. Not warm to touch, no erythema. Unsure about etiology, meniscal involvement versus ilio tibial band syndrome, biceps femoris tendinopathy, versus other possible traumatic etiology. We would at this time refer this patient to sports medicine to let them decide if the patient needs MRI knee as opposed to ordering this expensive test when it might not be needed.   I personally confirmed the key portions of the history and exam documented by Dr. Yetta Barre and I reviewed pertinent patient test results.  The assessment, diagnosis, and plan were formulated together and I agree with the documentation in the resident's note.

## 2013-03-01 NOTE — Assessment & Plan Note (Signed)
Complained of sore throat at last visit, shown to be strep -ve. Has since resolved completely.

## 2013-03-01 NOTE — Assessment & Plan Note (Signed)
Last lipid panel in 03/2012 showed total cholesterol 231, Trigs 165, LDL 165, HDL 41. Patient currently on Zocor 40 mg.  -Will repeat lipids today.

## 2013-03-01 NOTE — Assessment & Plan Note (Signed)
Patient still with left knee pain. Started about 5-6 weeks ago now, after she rolled her left ankle when walking. On exam, the left knee is still tender to palpation superior and lateral to the patella, with tenderness in the lateral joint space. There does still appear to be a mild effusion superior to the patella as well. No instability is noted in the knee joint. Anterior/posterior drawer test -ve, pain laterally with Mcmurray's test and Apley test. No audible or palpable click/pop on exam. -XR performed at previous visit, which showed no acute fracture or subluxation. But with mild degenerative changes and a small joint effusion. -Referred to sports medicine for further evaluation.

## 2013-03-01 NOTE — Patient Instructions (Signed)
1. You already have a follow up appointment scheduled with PCP Dr. Manson Passey on 03/11/13 We will get a referral to see sports medicine, they will call you for that appointment. 2. Please take all medications as prescribed.  3. If you have worsening of your symptoms or new symptoms arise, please call the clinic ([phone]), or go to the ER immediately if symptoms are severe.  You have done a great job in taking all your medications. I appreciate it very much. Please continue doing that.

## 2013-03-01 NOTE — Progress Notes (Signed)
Patient ID: Theresa Kirk, female   DOB: 1961-04-09, 52 y.o.   MRN: [id number]   Subjective:   Patient ID: Theresa Kirk female   DOB: [birth year] 52 y.o.   MRN: [id number]  HPI: [name] is a 51 y.o. F w/ PMHx of Depression, HLD, and Fibromyalgia, presents today for follow up for complaints of sore throat and left knee pain. She was last seen in the clinic on 02/10/13 with sore throat, found to be negative for strep. She says her throat complaints have since resolved and she feels much better at this time.  The knee pain, however, is still causing her issues. This started about 5-6 weeks ago now, after she rolled her left ankle when walking. She claims the knee is painful with gait and is still swollen. She feels the pain most with gait, but it also causes her a "throbbing" pain when she first lies down to go to bed. She also claims to have some instability with gait and feels a "popping" sensation while walking. She has used rest and a heating pad to relieve the pain as well as Aleve, which she says does not seem to help much. On exam, the left knee is still tender to palpation superior and lateral to the patella, with tenderness in the lateral joint space. There does still appear to be a mild effusion superior to the patella as well. No instability is noted in the knee joint. Anterior drawer test -ve, Pain laterally with Mcmurray's test and Apley test. XR performed at previous visit, which showed no acute fracture or subluxation. But with mild degenerative changes and a small joint effusion.  Otherwise, the patient denies any further complaints. No sore throat, cough, SOB, fever, chills, or tender lymphadenopathy. She also denies any urinary symptoms, diarrhea, or abdominal pain.  Past Medical History  Diagnosis Date  . Depression   . Fibromyalgia   . Carpal tunnel syndrome   . Alcohol abuse   . High cholesterol   . Arthritis    Current Outpatient Prescriptions  Medication Sig  Dispense Refill  . Cetirizine HCl 10 MG CAPS Take 1 capsule (10 mg total) by mouth daily.  30 capsule  6  . fluconazole (DIFLUCAN) 150 MG tablet Take 1 tablet (150 mg total) by mouth once.  1 tablet  0  . metroNIDAZOLE (FLAGYL) 500 MG tablet Take 1 tablet (500 mg total) by mouth 2 (two) times daily.  14 tablet  0  . pantoprazole (PROTONIX) 20 MG tablet Take 1 tablet (20 mg total) by mouth daily.  30 tablet  3  . QUEtiapine (SEROQUEL XR) 200 MG 24 hr tablet Take 200 mg by mouth at bedtime.        . sertraline (ZOLOFT) 25 MG tablet Take 100 mg by mouth 2 (two) times daily.       . simvastatin (ZOCOR) 40 MG tablet Take 1 tablet (40 mg total) by mouth at bedtime.  30 tablet  3  . SUMAtriptan (IMITREX) 25 MG tablet Take 1 tablet (25 mg total) by mouth every 2 (two) hours as needed for migraine.  10 tablet  0   No current facility-administered medications for this visit.   Family History  Problem Relation Age of Onset  . Diabetes Mother   . Hypertension Mother   . Stroke Mother   . Diabetes Sister   . Thyroid disease Sister   . Hypertension Sister   . Cancer Maternal Aunt     unknown   History  Social History  . Marital Status: Single    Spouse Name: N/A    Number of Children: N/A  . Years of Education: N/A   Social History Main Topics  . Smoking status: Former Smoker    Quit date: 06/03/2000  . Smokeless tobacco: Never Used  . Alcohol Use: Yes  . Drug Use: Yes    Special: Marijuana  . Sexual Activity: Yes    Partners: Male    Birth Control/ Protection: Condom   Other Topics Concern  . None   Social History Narrative   Lives with mother in [place].   Currently unemployed.   Used to work at [employer] in [place].   ETOH use: weekend, 1-2 beers from [employer]: used to drink everyday   Former smoker   Current THC use   H/o cocaine use: quit in 2001.   Review of Systems: General: Denies fever, chills, diaphoresis, appetite change and fatigue.  HEENT:Denies sore throat  (since resolved), change in vision, eye pain, redness, hearing loss, congestion, rhinorrhea, sneezing, mouth sores, neck stiffness and tinnitus.   Respiratory: Denies SOB, DOE, cough, chest tightness, and wheezing.   Cardiovascular: Denies chest pain, palpitations and leg swelling.  Gastrointestinal: Denies nausea, vomiting, abdominal pain, diarrhea, constipation, blood in stool and abdominal distention.  Genitourinary: Denies dysuria, urgency, frequency, hematuria, flank pain and difficulty urinating.  Endocrine: Denies hot or cold intolerance, sweats, polyuria, polydipsia. Musculoskeletal: Positive for left knee pain and swelling, affecting her gait. Denies back pain. Skin: Denies pallor, rash and wounds.  Neurological: Denies dizziness, seizures, syncope, weakness, lightheadedness, numbness and headaches.  Psychiatric/Behavioral: Denies mood changes, confusion, nervousness, sleep disturbance and agitation.  Objective:  Physical Exam: Filed Vitals:   03/01/13 0948  BP: 114/75  Pulse: 80  Temp: 97.6 F (36.4 C)  TempSrc: Oral  Height: 5\' 5"  (1.651 m)  Weight: 174 lb 6.4 oz (79.107 kg)  SpO2: 99%   General: Vital signs reviewed.  Patient is a well-developed and well-nourished, in no acute distress and cooperative with exam. Alert and oriented x3.  Head: Normocephalic and atraumatic. Nose: No erythema or drainage noted.  Turbinates normal. Mouth: No swelling, erythema, sores, ulcers, or exudates. Eyes: PERRL, EOMI, conjunctivae normal, No scleral icterus.  Neck: Supple, trachea midline, normal ROM, No JVD, thyromegaly, or carotid bruit present.  Cardiovascular: RRR, S1 normal, S2 normal, no murmurs, gallops, or rubs. Pulmonary/Chest: Normal respiratory effort, CTAB, no wheezes, rales, or rhonchi. Abdominal: Soft. Non-tender, non-distended, bowel sounds are normal, no masses, organomegaly, or guarding present.  Musculoskeletal: Left knee pain and mild effusion superior to the patella.  Tender to palpation of lateral joint space. Pain exhibited with Mcmurray's and Apley tests. No palpable or audible click or pop. No joint deformities, erythema, or stiffness, ROM full, but somewhat limited d/t pain. Extremities: Mild swelling of left knee joint. No edema,  pulses symmetric and intact bilaterally. No cyanosis or clubbing. Neurological: A&O x3, Strength is normal and symmetric bilaterally, cranial nerve II-XII are grossly intact, no focal motor deficit, sensory intact to light touch bilaterally.  Skin: Warm, dry and intact. No rashes or erythema. Psychiatric: Normal mood and affect. speech and behavior is normal. Cognition and memory are normal.   Assessment & Plan:   Please see problem-based assessment and plan.

## 2013-03-04 ENCOUNTER — Encounter: Payer: Self-pay | Admitting: Obstetrics & Gynecology

## 2013-03-11 ENCOUNTER — Ambulatory Visit: Payer: Medicare HMO | Admitting: Internal Medicine

## 2013-03-17 ENCOUNTER — Ambulatory Visit: Payer: Medicare HMO | Admitting: Sports Medicine

## 2013-03-18 ENCOUNTER — Ambulatory Visit: Payer: Medicare HMO | Admitting: Internal Medicine

## 2013-03-24 ENCOUNTER — Encounter: Payer: Self-pay | Admitting: Sports Medicine

## 2013-03-24 ENCOUNTER — Ambulatory Visit (INDEPENDENT_AMBULATORY_CARE_PROVIDER_SITE_OTHER): Payer: Medicare HMO | Admitting: Sports Medicine

## 2013-03-24 VITALS — BP 120/70 | Ht 65.0 in | Wt 173.0 lb

## 2013-03-24 DIAGNOSIS — M25569 Pain in unspecified knee: Secondary | ICD-10-CM

## 2013-03-24 DIAGNOSIS — M25562 Pain in left knee: Secondary | ICD-10-CM

## 2013-03-24 NOTE — Patient Instructions (Signed)
You have been scheduled for an appointment for MRI on 03/31/13 at 9:30 am at [facility]   [address] [place] [address]  [phone]

## 2013-03-24 NOTE — Progress Notes (Addendum)
  Subjective:    Patient ID: Maryam Feely, female    DOB: [birth year], 52 y.o.   MRN: [id number]  HPI Comments: 52 year old female with three-month history of left knee pain following a fall. She had a mechanical fall which was at her ankle and caught herself. The following day, she had swelling and pain to her left knee. She was evaluated at emergency department where plain films demonstrated a joint effusion but no bony abnormality. She was prescribed anti-inflammatories as well as rest ice compression and elevation with no significant improvement in her pain. Since that time, any sort of extended ambulation or standing causes pain and swelling of the knee which resolves with rest, ice, elevation. She has never previously injured this knee before and has no history of osteoarthritis in this knee, although she does endorse some roving arthralgias. For pain, she has been taking Aleve, 4 tablets, 2-3 times daily.     Review of Systems  All other systems reviewed and are negative.       Objective:   Physical Exam  Nursing note and vitals reviewed. Constitutional: She appears well-developed and well-nourished.  Musculoskeletal:  Left knee: Suprapatellar joint effusion noted. Normal alignment. No warmth, erythema. Tenderness over the suprapatellar effusion as well as over the medial and lateral joint lines. No ligamentous laxity. Full-strength knee extension, knee flexion. Positive McMurray's and positive for [facility] test.   MSK ultrasound: Limited ultrasound of the left knee obtained in long axis and short axis views shows a moderate suprapatellar effusion with hyperechoic bodies within the effusion likely representing hemorrhage. Long axis view of the medial joint line shows likely fragments of the medial meniscus. Long axis view of the lateral joint line also shows likely fragmentation of the lateral meniscus with possible extrusion into the joint.     Assessment & Plan:   52 year old  female with a history of a mechanical fall with residual left knee pain who presents with likely left meniscal tear.  #1. Likely left lateral and medial meniscus tear with joint effusion -Based on physical exam with positive McMurray's, positive Thessaly, ultrasound findings, feel that meniscal tear most likely in this patient with a history of trauma with chronic joint effusion and pain. Will set up with MRI to further evaluate for  complexity of meniscal tear. I will call her with results once available which point we'll delineate definitive treatment.

## 2013-03-25 ENCOUNTER — Ambulatory Visit: Payer: Medicare HMO | Admitting: Internal Medicine

## 2013-03-25 ENCOUNTER — Encounter: Payer: Self-pay | Admitting: Internal Medicine

## 2013-03-31 ENCOUNTER — Ambulatory Visit
Admission: RE | Admit: 2013-03-31 | Discharge: 2013-03-31 | Disposition: A | Discharge status: Home / Self Care | Payer: Medicare HMO | Source: Ambulatory Visit | Attending: Sports Medicine | Admitting: Sports Medicine

## 2013-03-31 DIAGNOSIS — M25562 Pain in left knee: Secondary | ICD-10-CM

## 2013-04-01 ENCOUNTER — Telehealth: Payer: Self-pay | Admitting: Sports Medicine

## 2013-04-01 ENCOUNTER — Telehealth: Payer: Self-pay | Admitting: *Deleted

## 2013-04-01 NOTE — Telephone Encounter (Signed)
Scheduled pt for appt with Dr. Renaye Rakers on 04/05/13 at 10 am.  Pt notified of appt info.

## 2013-04-01 NOTE — Telephone Encounter (Signed)
Message copied by Jacki Cones C on Thu Apr 01, 2013 11:25 AM ------      Message from: Reino Bellis R      Created: Thu Apr 01, 2013  8:30 AM      Regarding: Refer to Dr Renaye Rakers       Refer to Dr Renaye Rakers... DX-? PVNS      ----- Message -----         From: Rad Results In Interface         Sent: 03/31/2013   1:49 PM           To: Ralene Cork, DO                   ------

## 2013-04-01 NOTE — Telephone Encounter (Signed)
I spoke with the patient yesterday on the phone regarding MRI findings of her left knee. There is no evidence of a meniscal tear but she has lipomatous hypertrophy within the suprapatella bursa consistent with a degree of a lipoma arborescens. There is also evidence of synovial thickening in the popliteal fossa suggestive of pigmented villonodular synovitis. Based on these findings I recommended orthopedic surgical consultation. I wore refer the patient to Dr. Eulah Pont for further treatment. Followup with me when necessary.

## 2013-04-14 ENCOUNTER — Encounter: Payer: Medicare HMO | Admitting: Obstetrics & Gynecology

## 2013-04-14 NOTE — Addendum Note (Signed)
Addended by: Dorie Rank E on: 04/14/2013 07:00 PM   Modules accepted: Orders

## 2013-04-16 ENCOUNTER — Other Ambulatory Visit: Payer: Self-pay | Admitting: Internal Medicine

## 2013-05-13 ENCOUNTER — Other Ambulatory Visit: Payer: Self-pay | Admitting: Internal Medicine

## 2013-05-13 ENCOUNTER — Encounter: Payer: Self-pay | Admitting: Internal Medicine

## 2013-05-13 ENCOUNTER — Ambulatory Visit (INDEPENDENT_AMBULATORY_CARE_PROVIDER_SITE_OTHER): Payer: Medicare HMO | Admitting: Internal Medicine

## 2013-05-13 VITALS — BP 127/79 | HR 88 | Temp 97.1°F | Ht 65.0 in | Wt 176.1 lb

## 2013-05-13 DIAGNOSIS — Z1231 Encounter for screening mammogram for malignant neoplasm of breast: Secondary | ICD-10-CM

## 2013-05-13 DIAGNOSIS — N6459 Other signs and symptoms in breast: Secondary | ICD-10-CM

## 2013-05-13 DIAGNOSIS — E785 Hyperlipidemia, unspecified: Secondary | ICD-10-CM

## 2013-05-13 DIAGNOSIS — K219 Gastro-esophageal reflux disease without esophagitis: Secondary | ICD-10-CM

## 2013-05-13 DIAGNOSIS — N6452 Nipple discharge: Secondary | ICD-10-CM

## 2013-05-13 DIAGNOSIS — R109 Unspecified abdominal pain: Secondary | ICD-10-CM

## 2013-05-13 DIAGNOSIS — R5381 Other malaise: Secondary | ICD-10-CM

## 2013-05-13 LAB — CBC WITH DIFFERENTIAL/PLATELET
Basophils Absolute: 0 10*3/uL (ref 0.0–0.1)
Basophils Relative: 0 % (ref 0–1)
Eosinophils Absolute: 0.2 10*3/uL (ref 0.0–0.7)
Eosinophils Relative: 2 % (ref 0–5)
HCT: 38.1 % (ref 36.0–46.0)
MCHC: 34.1 g/dL (ref 30.0–36.0)
MCV: 89 fL (ref 78.0–100.0)
Monocytes Absolute: 0.8 10*3/uL (ref 0.1–1.0)
RDW: 14.5 % (ref 11.5–15.5)

## 2013-05-13 LAB — URINALYSIS, ROUTINE W REFLEX MICROSCOPIC
Bilirubin Urine: NEGATIVE
Glucose, UA: NEGATIVE mg/dL
Protein, ur: NEGATIVE mg/dL
Specific Gravity, Urine: 1.017 (ref 1.005–1.030)

## 2013-05-13 LAB — POCT URINALYSIS DIPSTICK
Glucose, UA: NEGATIVE
Spec Grav, UA: 1.025
Urobilinogen, UA: 0.2

## 2013-05-13 LAB — COMPLETE METABOLIC PANEL WITH GFR
AST: 16 U/L (ref 0–37)
Alkaline Phosphatase: 73 U/L (ref 39–117)
BUN: 14 mg/dL (ref 6–23)
Calcium: 9.9 mg/dL (ref 8.4–10.5)
Chloride: 105 mEq/L (ref 96–112)
Creat: 0.84 mg/dL (ref 0.50–1.10)

## 2013-05-13 LAB — LIPID PANEL
Cholesterol: 246 mg/dL — ABNORMAL HIGH (ref 0–200)
Triglycerides: 139 mg/dL (ref <150)
VLDL: 28 mg/dL (ref 0–40)

## 2013-05-13 MED ORDER — SERTRALINE HCL 50 MG PO TABS: 50.0000 mg | ORAL_TABLET | Freq: Two times a day (BID) | ORAL | Status: DC

## 2013-05-13 MED ORDER — SIMVASTATIN 40 MG PO TABS: 40.0000 mg | ORAL_TABLET | Freq: Every day | ORAL | Status: DC

## 2013-05-13 MED ORDER — PANTOPRAZOLE SODIUM 20 MG PO TBEC: DELAYED_RELEASE_TABLET | ORAL | Status: DC

## 2013-05-13 MED ORDER — QUETIAPINE FUMARATE ER 200 MG PO TB24: 200.0000 mg | ORAL_TABLET | Freq: Every day | ORAL | Status: DC

## 2013-05-13 NOTE — Patient Instructions (Signed)
-  Referred you to Gynecology. -Blood work today. -Ordered repeat mammogram. -Ordered CT scan of your abdomen. -Sent prescriptions to your pharmacy. -Return 3 months.

## 2013-05-13 NOTE — Progress Notes (Signed)
   Subjective:    Patient ID: Theresa Kirk, female    DOB: [birth year], 52 y.o.   MRN: [id number]  HPI Presents for follow up. States she had not had any more nipple drainage. I will repeat her mammogram this year.  She states she has not followed up with mental health due to cost issues and has not fully been taking her seroquel and zoloft. She states zoloft 100 mg twice a day made her stomach upset but she tolerated 50 mg twice day. States she has been having intermittent sharp LLQ pains the last 2 weeks. Denies hematochezia or melena.  States she has intermittent dysuria.     Review of Systems  Constitutional: Positive for appetite change and fatigue. Negative for unexpected weight change.  HENT: Negative for trouble swallowing.   Respiratory: Negative for shortness of breath.   Gastrointestinal: Positive for abdominal pain. Negative for nausea, vomiting and blood in stool.  Genitourinary: Negative for vaginal bleeding, vaginal discharge and vaginal pain.  Hematological: Negative for adenopathy.       Objective:   Physical Exam  Constitutional: She is oriented to person, place, and time. She appears well-developed and well-nourished.  HENT:  Head: Normocephalic and atraumatic.  Eyes: Conjunctivae and EOM are normal. Pupils are equal, round, and reactive to light.  Neck: Normal range of motion. Neck supple. No JVD present. No thyromegaly present.  Cardiovascular: Normal rate, regular rhythm, normal heart sounds and intact distal pulses.  Exam reveals no gallop and no friction rub.   No murmur heard. Pulmonary/Chest: Effort normal and breath sounds normal. No respiratory distress. She has no wheezes.  Abdominal: Soft. Bowel sounds are normal. She exhibits no distension and no mass. There is tenderness in the left lower quadrant. There is no rebound and no guarding.    Musculoskeletal: Normal range of motion. She exhibits no edema.  Lymphadenopathy:    She has no cervical  adenopathy.  Neurological: She is alert and oriented to person, place, and time. No cranial nerve deficit.  Skin: Skin is warm.  Psychiatric: She has a normal mood and affect. Her behavior is normal.   Filed Vitals:   05/13/13 0950  BP: 127/79  Pulse: 88  Temp: 97.1 F (36.2 C)       Assessment & Plan:  51 yr. Old female w/ pmhx significant for HL, alcohol abuse, depression, fibromyalgia, GERD, presents for follow up.  1) hx nipple discharge: She has been evaluated by Dr. Corliss Skains of [facility] due to findings on ductogram in 05/05/2012 with plans to recheck in three months. She denies any further drainage. Repeat mammogram. 2) Depression: She can't afford copay. I will give her zoloft and seroquel to her pharmacy. She states she can only tolerate 50 mg bid of zoloft. 3) GERD: omeprazole, stable.  4) HL: She has not been taking her simvastatin since she ran out. Last LDL was 153. I will send this in for her.  5) Abdominal pain: UA, CBC today. Need colonoscopy records. She describes her pain as intermittent, sharp in nature. Order CT and/pelvis. 6) Health Maintenance: States she had a colonoscopy, but I don't see any in records. I referred her last time. Will request records. Discussed importance of this with her. She states she had flu vaccine, I don't have a record of this. She is requesting follow up with Gyn.

## 2013-05-19 ENCOUNTER — Encounter: Payer: Medicare HMO | Admitting: Obstetrics & Gynecology

## 2013-05-20 NOTE — Progress Notes (Signed)
Patient ID: Theresa Kirk, female   DOB: Feb 11, 1961, 52 y.o.   MRN: [id number] Received documentation from [facility] .Marland Kitchen She had a colonoscopy on 04/16/2012 with findings of nonbleeding cecal AVM, multiple polyps and hemorrhoids.Recommendation to repeat colonoscopy in 5 yrs. Pathology shows hyperplastic polyps.  Performed by Dr. Jeani Hawking.  Jonah Blue, DO, FACP Faculty [facility] Internal Medicine Residency Program 05/20/2013, 1:11 PM

## 2013-05-25 ENCOUNTER — Telehealth: Payer: Self-pay | Admitting: *Deleted

## 2013-05-25 NOTE — Telephone Encounter (Signed)
SPOKE WITH [name] CONCERNING HER CT; APPT. Monday 12-29-014 @ 11:00/ 10:45 AM ARRIVAL. PATIENT WAS INSTRUCTED TO COME TO RADIOLOGY AYNTIME THIS EVENING, Saturday OR Sunday TO PICK UP CONTRAST. GAVE PATIENT RADIOLOGY PHONE NUMBER TO CALL IF NEED TO CHANGE APPOINTMENT([phone])Theresa Kirk TTII   12-22-014

## 2013-05-31 ENCOUNTER — Ambulatory Visit (HOSPITAL_COMMUNITY): Payer: Medicare HMO

## 2013-06-08 ENCOUNTER — Ambulatory Visit (HOSPITAL_COMMUNITY)
Admission: RE | Admit: 2013-06-08 | Discharge: 2013-06-08 | Disposition: A | Discharge status: Home / Self Care | Payer: Medicare HMO | Source: Ambulatory Visit | Attending: Internal Medicine | Admitting: Internal Medicine

## 2013-06-08 DIAGNOSIS — R109 Unspecified abdominal pain: Secondary | ICD-10-CM

## 2013-06-10 ENCOUNTER — Ambulatory Visit (HOSPITAL_COMMUNITY): Payer: Medicare HMO

## 2013-06-15 ENCOUNTER — Ambulatory Visit (HOSPITAL_COMMUNITY)
Admission: RE | Admit: 2013-06-15 | Discharge: 2013-06-15 | Disposition: A | Discharge status: Home / Self Care | Payer: Medicare HMO | Source: Ambulatory Visit | Attending: Internal Medicine | Admitting: Internal Medicine

## 2013-06-15 DIAGNOSIS — R143 Flatulence: Secondary | ICD-10-CM | POA: Diagnosis not present

## 2013-06-15 DIAGNOSIS — R110 Nausea: Secondary | ICD-10-CM | POA: Diagnosis present

## 2013-06-15 DIAGNOSIS — R141 Gas pain: Secondary | ICD-10-CM | POA: Diagnosis not present

## 2013-06-15 DIAGNOSIS — R1084 Generalized abdominal pain: Secondary | ICD-10-CM | POA: Diagnosis not present

## 2013-06-15 DIAGNOSIS — R142 Eructation: Secondary | ICD-10-CM | POA: Insufficient documentation

## 2013-06-15 MED ORDER — IOHEXOL 300 MG/ML  SOLN
80.0000 mL | Freq: Once | INTRAMUSCULAR | Status: AC | PRN
  Administered 2013-06-15: 80 mL via INTRAVENOUS

## 2013-06-18 ENCOUNTER — Ambulatory Visit
Admission: RE | Admit: 2013-06-18 | Discharge: 2013-06-18 | Disposition: A | Discharge status: Home / Self Care | Payer: Medicare HMO | Source: Ambulatory Visit | Attending: Internal Medicine | Admitting: Internal Medicine

## 2013-06-18 DIAGNOSIS — Z1231 Encounter for screening mammogram for malignant neoplasm of breast: Secondary | ICD-10-CM

## 2013-06-22 ENCOUNTER — Other Ambulatory Visit: Payer: Self-pay | Admitting: Internal Medicine

## 2013-06-22 DIAGNOSIS — R928 Other abnormal and inconclusive findings on diagnostic imaging of breast: Secondary | ICD-10-CM

## 2013-06-23 ENCOUNTER — Other Ambulatory Visit: Payer: Self-pay | Admitting: Internal Medicine

## 2013-06-23 ENCOUNTER — Other Ambulatory Visit: Payer: Self-pay | Admitting: Orthopedic Surgery

## 2013-06-23 DIAGNOSIS — M545 Low back pain, unspecified: Secondary | ICD-10-CM

## 2013-06-23 DIAGNOSIS — N898 Other specified noninflammatory disorders of vagina: Secondary | ICD-10-CM

## 2013-06-30 ENCOUNTER — Ambulatory Visit
Admission: RE | Admit: 2013-06-30 | Discharge: 2013-06-30 | Disposition: A | Discharge status: Home / Self Care | Payer: Medicare HMO | Source: Ambulatory Visit | Attending: Orthopedic Surgery | Admitting: Orthopedic Surgery

## 2013-06-30 DIAGNOSIS — M545 Low back pain, unspecified: Secondary | ICD-10-CM

## 2013-07-02 ENCOUNTER — Encounter: Payer: Self-pay | Admitting: Obstetrics & Gynecology

## 2013-07-02 ENCOUNTER — Ambulatory Visit (INDEPENDENT_AMBULATORY_CARE_PROVIDER_SITE_OTHER): Payer: Medicare HMO | Admitting: Obstetrics & Gynecology

## 2013-07-02 VITALS — BP 115/75 | HR 90 | Temp 97.8°F | Ht 65.0 in | Wt 176.5 lb

## 2013-07-02 DIAGNOSIS — N898 Other specified noninflammatory disorders of vagina: Secondary | ICD-10-CM

## 2013-07-02 DIAGNOSIS — N949 Unspecified condition associated with female genital organs and menstrual cycle: Secondary | ICD-10-CM

## 2013-07-02 LAB — POCT URINALYSIS DIP (DEVICE)
Bilirubin Urine: NEGATIVE
Glucose, UA: NEGATIVE mg/dL
Hgb urine dipstick: NEGATIVE
LEUKOCYTES UA: NEGATIVE
NITRITE: NEGATIVE
PH: 5.5 (ref 5.0–8.0)
PROTEIN: NEGATIVE mg/dL
Specific Gravity, Urine: >=1.03 (ref 1.005–1.030)
UROBILINOGEN UA: 0.2 mg/dL (ref 0.0–1.0)

## 2013-07-02 NOTE — Progress Notes (Signed)
Subjective:     Patient ID: Theresa Kirk, female   DOB: 10-30-1960, 53 y.o.   MRN: [id number]  HPI Pt presents with c/o odor for 2 weeks.  She reports recurrent 'infections' since she's had her hyst.  She says that  She has some pain with voiding that is not present currently.  She denies stool per vagina and denies problems passing her stool.       Review of Systems     Objective:   Physical Exam BP 115/75  Pulse 90  Temp(Src) 97.8 F (36.6 C) (Oral)  Ht 5\' 5"  (1.651 m)  Wt 176 lb 8 oz (80.06 kg)  BMI 29.37 kg/m2  LMP 09/14/2006 Pt in NAD GU: EGBUS: no lesions Vagina: no blood in vault. Cuff well healed Adnexa: no masses;         06/15/2013 EXAM:  CT ABDOMEN AND PELVIS WITH CONTRAST  TECHNIQUE:  Multidetector CT imaging of the abdomen and pelvis was performed  using the standard protocol following bolus administration of  intravenous contrast.  CONTRAST: 65mL OMNIPAQUE IOHEXOL 300 MG/ML SOLN  COMPARISON: CT ABD-PEL WO/W CM dated 04/09/2011; US PELVIS COMPLETE  MODIFY dated 07/20/2006; DG ABDOMEN 1V dated 11/15/2010  FINDINGS:  The liver, spleen, pancreas, and adrenal glands appear unremarkable.  Gallbladder contracted.  Kidneys and proximal ureters unremarkable. 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  No pathologic upper abdominal adenopathy is observed. No pathologic  pelvic adenopathy is observed. Orally administered contrast extends  through to the colon. Appendix normal. Uterus absent. Trace amount  of free pelvic fluid. Adnexa unremarkable.  Urinary bladder empty but otherwise normal. Terminal ileum  unremarkable. Gas density in the vagina is likely incidental.  IMPRESSION:  1. Trace free pelvic fluid, nonspecific.  2. Gas density in the vagina is likely incidental, but if the  patient has an abnormal vaginal discharge raising question of  entities such  as rectovaginal fistula, then workup with contrast  enema might be helpful.  3. 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  No change from 2012.   Assessment:     Foul smelling vaginal discharge and and dysuria. No evidence of fistula      Plan:     Wet smear Note to pts primary care physician

## 2013-07-02 NOTE — Patient Instructions (Signed)

## 2013-07-03 LAB — WET PREP, GENITAL
Trich, Wet Prep: NONE SEEN
YEAST WET PREP: NONE SEEN

## 2013-07-05 ENCOUNTER — Other Ambulatory Visit: Payer: Self-pay | Admitting: Obstetrics & Gynecology

## 2013-07-05 DIAGNOSIS — N760 Acute vaginitis: Secondary | ICD-10-CM

## 2013-07-05 DIAGNOSIS — B9689 Other specified bacterial agents as the cause of diseases classified elsewhere: Secondary | ICD-10-CM

## 2013-07-05 MED ORDER — METRONIDAZOLE 500 MG PO TABS: 500.0000 mg | ORAL_TABLET | Freq: Two times a day (BID) | ORAL | Status: AC

## 2013-07-05 NOTE — Progress Notes (Signed)
Called pt. And informed her of BV and prescription at [facility] on [address]. Pt. Verbalized understanding and had no further questions.

## 2013-07-08 ENCOUNTER — Other Ambulatory Visit: Payer: Self-pay | Admitting: Internal Medicine

## 2013-07-09 ENCOUNTER — Ambulatory Visit
Admission: RE | Admit: 2013-07-09 | Discharge: 2013-07-09 | Disposition: A | Discharge status: Home / Self Care | Payer: Medicare HMO | Source: Ambulatory Visit | Attending: Internal Medicine | Admitting: Internal Medicine

## 2013-07-09 DIAGNOSIS — R928 Other abnormal and inconclusive findings on diagnostic imaging of breast: Secondary | ICD-10-CM

## 2013-07-09 NOTE — Addendum Note (Signed)
Addended by: Dominic Pea on: 07/09/2013 09:58 AM   Modules accepted: Orders

## 2013-09-02 ENCOUNTER — Ambulatory Visit (INDEPENDENT_AMBULATORY_CARE_PROVIDER_SITE_OTHER): Payer: Commercial Managed Care - HMO | Admitting: Internal Medicine

## 2013-09-02 ENCOUNTER — Encounter: Payer: Self-pay | Admitting: Internal Medicine

## 2013-09-02 VITALS — BP 129/81 | HR 65 | Temp 97.1°F | Ht 65.0 in | Wt 178.7 lb

## 2013-09-02 DIAGNOSIS — R1032 Left lower quadrant pain: Secondary | ICD-10-CM

## 2013-09-02 DIAGNOSIS — F32A Depression, unspecified: Secondary | ICD-10-CM

## 2013-09-02 DIAGNOSIS — R5383 Other fatigue: Secondary | ICD-10-CM

## 2013-09-02 DIAGNOSIS — R5381 Other malaise: Secondary | ICD-10-CM

## 2013-09-02 DIAGNOSIS — E785 Hyperlipidemia, unspecified: Secondary | ICD-10-CM

## 2013-09-02 DIAGNOSIS — R928 Other abnormal and inconclusive findings on diagnostic imaging of breast: Secondary | ICD-10-CM

## 2013-09-02 DIAGNOSIS — F329 Major depressive disorder, single episode, unspecified: Secondary | ICD-10-CM

## 2013-09-02 DIAGNOSIS — F3289 Other specified depressive episodes: Secondary | ICD-10-CM

## 2013-09-02 DIAGNOSIS — K219 Gastro-esophageal reflux disease without esophagitis: Secondary | ICD-10-CM

## 2013-09-02 NOTE — Progress Notes (Signed)
Subjective:    Patient ID: Theresa Kirk, female    DOB: 1960-08-03, 53 y.o.   MRN: [id number]  HPI States she has not had any more nipple drainage. She had a mammogram on 06/22/2013 that showed probably benign breast masses on right with probably benign left breast calcifications. U/S was also performed and there is recommendation of short interval follow up. I have informed her that I will be referring her back to Dr. Georgette Dover for consideration of biopsy. She states she has been taking seroquel and zoloft , but doesn't feel better or worse. I will refer her back to psychiatry, she states last time there were so payment issues. No SI/HI today. Denies any hallucinations. States her abdominal pain has improved, mid abdomen, "aches". Today denies vaginal drainage. Of note, she has had a TAH in past. No blood in stool. No dark stools. I had sent her to Gyn for eval due to complains of vaginal drainage (dark) and findings on CT abdomen concerning for fistula, but Dr. Ihor Dow examined her and no evidence was found of fistula. She was treated for BV. She was having some back pain and had an MRI Lumbar spine. An MRI w/o contrast was performed of lumbar spine and there was no findings of disc protrusion or stenosis, and mild facet hypertrophy.  States she feels sluggish at times. Denies any knee pain today.   Review of Systems  Constitutional: Positive for fatigue. Negative for fever, chills, appetite change and unexpected weight change.  HENT: Negative for trouble swallowing and voice change.   Eyes: Negative for photophobia.  Respiratory: Negative for shortness of breath.   Cardiovascular: Negative for chest pain and leg swelling.  Gastrointestinal: Positive for abdominal pain. Negative for nausea, vomiting, constipation, blood in stool and abdominal distention.  Genitourinary: Negative for dysuria, frequency, vaginal bleeding, vaginal discharge, genital sores, vaginal pain and pelvic pain.    Musculoskeletal: Positive for back pain. Negative for neck pain and neck stiffness.  Neurological: Negative for dizziness, light-headedness, numbness and headaches.  Hematological: Negative for adenopathy. Does not bruise/bleed easily.  Psychiatric/Behavioral: Positive for decreased concentration. Negative for suicidal ideas and self-injury. The patient is not nervous/anxious.        Objective:   Physical Exam  Constitutional: She is oriented to person, place, and time. She appears well-developed and well-nourished. No distress.  HENT:  Head: Normocephalic and atraumatic.  Eyes: Conjunctivae are normal. Pupils are equal, round, and reactive to light.  Neck: Normal range of motion. Neck supple. No JVD present. No thyromegaly present.  Cardiovascular: Normal rate, regular rhythm and normal heart sounds.  Exam reveals no friction rub.   No murmur heard. Pulmonary/Chest: Effort normal and breath sounds normal. No respiratory distress. She has no wheezes.  Abdominal: Soft. Normal appearance and bowel sounds are normal. She exhibits no distension, no fluid wave, no abdominal bruit, no ascites and no mass. There is no hepatosplenomegaly. There is tenderness in the left lower quadrant. There is no CVA tenderness. No hernia.    Musculoskeletal: Normal range of motion. She exhibits no edema.  Lymphadenopathy:    She has no cervical adenopathy.  Neurological: She is alert and oriented to person, place, and time. She has normal reflexes. She displays normal reflexes. No cranial nerve deficit or sensory deficit. She exhibits normal muscle tone. Coordination normal. She displays no Babinski's sign on the right side. She displays no Babinski's sign on the left side.  Reflex Scores:      Tricep reflexes are  2+ on the right side and 2+ on the left side.      Bicep reflexes are 2+ on the right side and 2+ on the left side.      Brachioradialis reflexes are 2+ on the right side and 2+ on the left side.       Patellar reflexes are 2+ on the right side and 2+ on the left side.      Achilles reflexes are 2+ on the right side and 2+ on the left side. Skin: Skin is warm. She is not diaphoretic. No erythema.   Filed Vitals:   09/02/13 0819  BP: 129/81  Pulse: 65  Temp: 97.1 F (36.2 C)      Assessment & Plan:   53 yr. Old female w/ pmhx significant for HL, alcohol abuse, depression, fibromyalgia, GERD, presents for follow up.   1) hx nipple discharge and BI-RADS 3 mammogram: She has been evaluated by Dr. Georgette Dover of [facility] due to findings on ductogram in 05/05/2012. Mammogram has been repeated along with U/S with short interval follow up suggested. I will refer her back to Dr. Georgette Dover of [facility] for further options given these findings.   2) Depression: I will refer her back to psychiatry. She can now afford her medications but wants further options.   3) GERD: omeprazole, stable.   4) HL: She is not taking her simvastatin as I instructed her again. Last LDL was 171 (increased from 153). I will not resend a lipid panel as she has told me she is not taking it daily, so I would not expect improvement.  5) Abdominal pain: Uncertain etiology, is chronic in nature.  Colonoscopy performed in 2013 with findings of nonbleeding cecal AVM, multiple polyps and hemorrhoids.Recommendation to repeat colonoscopy in 5 yrs. Pathology shows hyperplastic polyps.  Performed by Dr. Carol Ada. 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 I will refer her to Dr. Benson Norway for any other input. Her abdominal exam today is benign with mild LLQ tenderness.  6) Fatigue: Check TSH. I suspect zoloft/seroquel may be contributing.  7) Health Maintenance:  Colonoscopy performed on 04/2012, repeat in 5 years. Gyn exam performed this year.  Mammogram as above.   Dominic Pea, DO, [facility] 09/02/2013, 9:07 AM

## 2013-09-02 NOTE — Patient Instructions (Signed)
-  Blood work today. -I have referred you to Dr. Benson Norway of GI for other opinion on your abdominal pain. -I have referred you back to the surgeon Dr. Georgette Dover for further opinion on your recent mammogram/ultrasound. -I have referred you back to Psychiatry for further consideration of changes in your medication. -Return in 3 months.

## 2013-09-03 LAB — TSH: TSH: 0.849 u[IU]/mL (ref 0.350–4.500)

## 2013-09-10 ENCOUNTER — Telehealth: Payer: Self-pay | Admitting: Internal Medicine

## 2013-09-10 NOTE — Telephone Encounter (Signed)
Called patient to verify she was aware of her upcoming appointment to see Dr. Georgette Dover on 4/21. She states she has it on her calender and will remember. Reminded her of the importance of this appointment, she understood.  Dominic Pea, DO, [facility] 09/10/2013, 1:17 PM

## 2013-09-21 ENCOUNTER — Ambulatory Visit (INDEPENDENT_AMBULATORY_CARE_PROVIDER_SITE_OTHER): Payer: Medicare HMO | Admitting: Surgery

## 2013-10-01 ENCOUNTER — Ambulatory Visit (INDEPENDENT_AMBULATORY_CARE_PROVIDER_SITE_OTHER): Payer: Medicare HMO | Admitting: Surgery

## 2013-10-06 ENCOUNTER — Encounter: Payer: Self-pay | Admitting: *Deleted

## 2013-10-15 ENCOUNTER — Encounter (INDEPENDENT_AMBULATORY_CARE_PROVIDER_SITE_OTHER): Payer: Self-pay | Admitting: Surgery

## 2013-10-15 ENCOUNTER — Other Ambulatory Visit (INDEPENDENT_AMBULATORY_CARE_PROVIDER_SITE_OTHER): Payer: Self-pay | Admitting: Surgery

## 2013-10-15 ENCOUNTER — Ambulatory Visit (INDEPENDENT_AMBULATORY_CARE_PROVIDER_SITE_OTHER): Payer: Medicare HMO | Admitting: Surgery

## 2013-10-15 VITALS — BP 133/87 | HR 72 | Temp 97.6°F | Resp 16 | Ht 65.0 in | Wt 178.2 lb

## 2013-10-15 DIAGNOSIS — R920 Mammographic microcalcification found on diagnostic imaging of breast: Secondary | ICD-10-CM

## 2013-10-15 HISTORY — DX: Mammographic microcalcification found on diagnostic imaging of breast: R92.0

## 2013-10-15 NOTE — Progress Notes (Signed)
Patient ID: Theresa Kirk, female   DOB: 05-21-1961, 53 y.o.   MRN: [id number]  Chief Complaint  Patient presents with  . Hernia  . Breast Problem    HPI Theresa Kirk is a 53 y.o. female.  Referred by Dr. Dominic Pea HPI This is a 53 yo female who was evaluated two years ago for a brief episode of clear nipple discharge.  She has not had any further drainage.  She had a recent mammogram and ultrasound that showed right breast cysts and benign-appearing left breast calcifications.  She is referred now to discuss these findings.  She denies any palpable masses, drainage, tenderness.  Past Medical History  Diagnosis Date  . Depression   . Fibromyalgia   . Carpal tunnel syndrome   . Alcohol abuse   . High cholesterol   . Arthritis     Past Surgical History  Procedure Laterality Date  . Abdominal hysterectomy  2008    h/o uterine fibroids, ?partial hysterectomy per pt 04/13/12  . Colonoscopy    . Knee surgery    . Foot surgery Right     Family History  Problem Relation Age of Onset  . Diabetes Mother   . Hypertension Mother   . Stroke Mother   . Diabetes Sister   . Thyroid disease Sister   . Hypertension Sister   . Cancer Maternal Aunt     unknown    Social History History  Substance Use Topics  . Smoking status: Former Smoker    Quit date: 06/03/2000  . Smokeless tobacco: Never Used  . Alcohol Use: Yes    Allergies  Allergen Reactions  . Iohexol     24 hr pre meds (did fine)    Current Outpatient Prescriptions  Medication Sig Dispense Refill  . Cetirizine HCl 10 MG CAPS Take 1 capsule (10 mg total) by mouth daily.  30 capsule  6  . pantoprazole (PROTONIX) 20 MG tablet TAKE ONE TABLET BY MOUTH EVERY DAY  30 tablet  3  . QUEtiapine (SEROQUEL XR) 200 MG 24 hr tablet Take 1 tablet (200 mg total) by mouth at bedtime.  30 tablet  3  . sertraline (ZOLOFT) 50 MG tablet Take 1 tablet (50 mg total) by mouth 2 (two) times daily.  60 tablet  3  . simvastatin  (ZOCOR) 40 MG tablet Take 1 tablet (40 mg total) by mouth at bedtime.  30 tablet  3  . SUMAtriptan (IMITREX) 25 MG tablet Take 1 tablet (25 mg total) by mouth every 2 (two) hours as needed for migraine.  10 tablet  0   No current facility-administered medications for this visit.    Review of Systems Review of Systems  Constitutional: Negative for fever, chills and unexpected weight change.  HENT: Positive for voice change. Negative for congestion, hearing loss, sore throat and trouble swallowing.   Eyes: Negative for visual disturbance.  Respiratory: Negative for cough and wheezing.   Cardiovascular: Positive for leg swelling. Negative for chest pain and palpitations.  Gastrointestinal: Negative for nausea, vomiting, abdominal pain, diarrhea, constipation, blood in stool, abdominal distention and anal bleeding.  Genitourinary: Negative for hematuria, vaginal bleeding and difficulty urinating.  Musculoskeletal: Positive for arthralgias.  Skin: Negative for rash and wound.  Neurological: Positive for headaches. Negative for seizures and syncope.  Hematological: Negative for adenopathy. Does not bruise/bleed easily.  Psychiatric/Behavioral: Negative for confusion.   Menarche - age 14 First pregnancy - age 34 Breastfeed - no OCP - 5 years Family history -  maternal aunt  Blood pressure 133/87, pulse 72, temperature 97.6 F (36.4 C), temperature source Temporal, resp. rate 16, height 5\' 5"  (1.651 m), weight 178 lb 3.2 oz (80.831 kg), last menstrual period 09/14/2006.  Physical Exam Physical Exam WDWN in NAD HEENT:  EOMI, sclera anicteric Neck:  No masses, no thyromegaly Lungs:  CTA bilaterally; normal respiratory effort Breasts:  Symmetric; no skin changes; no nipple discharge Bilateral fibrocystic changes; no dominant masses No axillary lymphadenopathy on either side CV:  Regular rate and rhythm; no murmurs Abd:  +bowel sounds, soft, non-tender, no masses Ext:  Well-perfused; no  edema Skin:  Warm, dry; no sign of jaundice  Data Reviewed  06/18/13 CLINICAL DATA: Screening.  EXAM:  DIGITAL SCREENING BILATERAL MAMMOGRAM WITH 3D TOMO WITH CAD  COMPARISON: Previous Exam(s)  ACR Breast Density Category c. Heterogeneous fibroglandular tissue  FINDINGS:  In the right breast, a possible asymmetry warrants further  evaluation with spot compression views and possibly ultrasound.  Calcifications in the left breast warrant further evaluation with  magnification views. Images were processed with CAD.  IMPRESSION:  Further evaluation is suggested for possible asymmetry in the right  breast and calcifications in the left breast.  RECOMMENDATION:  Bilateral diagnostic mammogram and possibly ultrasound of the right  breast.  The patient will be contacted regarding the findings, and additional  imaging will be scheduled.  BI-RADS CATEGORY 0: Incomplete. Need additional imaging evaluation  and/or prior mammograms for comparison.  Electronically Signed  By: Donavan Burnet M.D.  On: 06/18/2013 14:46   07/09/13 CLINICAL DATA: Abnormal screening mammogram.  EXAM:  DIGITAL DIAGNOSTIC BILATERAL MAMMOGRAM WITH CAD  ULTRASOUND RIGHT BREAST  COMPARISON: Multiple priors  ACR Breast Density Category c: The breast tissue is heterogeneously  dense, which may obscure small masses.  FINDINGS:  On spot compression imaging of the right breast, no definite  remaining mass is identified. Magnification views of the left breast  demonstrat a 8 x 4 x 5 mm group of round and faint punctate  calcifications in the upper-outer left breast. No associated mass or  architectural distortion. No linear or branching forms.  Mammographic images were processed with CAD.  On physical exam, no palpable abnormality is identified in central  and outer right breast in the area of mammographic concern.  Targeted ultrasound demonstrates multiple subcentimeter hypoechoic  masses around 9:00 in the right  breast, likely representing cysts.  The largest are at 9 o'clock, 10 cm from the nipple measuring 7 x 9  x 4 mm and at 9:30, 10 cm from the nipple, measuring 6 x 4 x 6 mm.  One of these likely corresponds with the mammographic abnormality.  IMPRESSION:  1. Probably benign right breast masses.  2. Probably benign left breast calcifications.  RECOMMENDATION:  Bilateral diagnostic mammogram and possibly right breast ultrasound.  I have discussed the findings and recommendations with the patient.  Results were also provided in writing at the conclusion of the  visit.  BI-RADS CATEGORY 3: Probably benign finding(s) - short interval  follow-up suggested.  Electronically Signed  By: Donavan Burnet M.D.  On: 07/09/2013 12:14   Assessment    Right breast cysts Left breast calcifications - benign-appearing     Plan    Close follow-up with bilateral diagnostic mammogram/ ultrasound at 6 months.  We will see her back after those are completed.        Imogene Burn. Lovinia Snare 10/15/2013, 11:29 AM

## 2013-11-13 ENCOUNTER — Other Ambulatory Visit: Payer: Self-pay | Admitting: Internal Medicine

## 2013-12-13 ENCOUNTER — Other Ambulatory Visit: Payer: Self-pay | Admitting: Internal Medicine

## 2014-01-03 ENCOUNTER — Other Ambulatory Visit: Payer: Commercial Managed Care - HMO

## 2014-01-11 ENCOUNTER — Encounter (INDEPENDENT_AMBULATORY_CARE_PROVIDER_SITE_OTHER): Payer: Self-pay | Admitting: Surgery

## 2014-01-11 ENCOUNTER — Ambulatory Visit (INDEPENDENT_AMBULATORY_CARE_PROVIDER_SITE_OTHER): Payer: Medicare HMO | Admitting: Surgery

## 2014-01-11 VITALS — BP 124/72 | HR 63 | Temp 98.5°F | Ht 65.0 in | Wt 170.0 lb

## 2014-01-11 DIAGNOSIS — R920 Mammographic microcalcification found on diagnostic imaging of breast: Secondary | ICD-10-CM

## 2014-01-11 DIAGNOSIS — Z87898 Personal history of other specified conditions: Secondary | ICD-10-CM

## 2014-01-11 NOTE — Progress Notes (Signed)
The patient returns for followup of her breast cyst and left breast calcifications. Our original plan was to reexamine her at 3 months after a repeat mammogram and ultrasound. However, secondary to some scheduling difficulties, she is not due for these studies until tomorrow. She has not noticed any further nipple drainage. She does not palpate any new masses on breast self-examination. She denies any breast tenderness.  On breast examination today, she continues to have bilateral fibrocystic changes with the right side greater than the left. No axillary lymphadenopathy. No nipple retraction or discharge. No dominant masses in either breast.  We will await the findings of the repeat mammogram and ultrasound tomorrow. If there are no concerning findings, we will see her on a when necessary basis. She should continue with annual mammograms as well as monthly self examination.  Imogene Burn. Georgette Dover, MD, [facility]  General/ Trauma Surgery  01/11/2014 10:03 AM

## 2014-01-12 ENCOUNTER — Ambulatory Visit
Admission: RE | Admit: 2014-01-12 | Discharge: 2014-01-12 | Disposition: A | Discharge status: Home / Self Care | Payer: Commercial Managed Care - HMO | Source: Ambulatory Visit | Attending: Surgery | Admitting: Surgery

## 2014-01-12 ENCOUNTER — Other Ambulatory Visit: Payer: Self-pay | Admitting: Internal Medicine

## 2014-01-12 DIAGNOSIS — R920 Mammographic microcalcification found on diagnostic imaging of breast: Secondary | ICD-10-CM

## 2014-01-27 ENCOUNTER — Telehealth: Payer: Self-pay | Admitting: *Deleted

## 2014-01-27 NOTE — Telephone Encounter (Signed)
-   She was referred to psychiatry by Dr. Murlean Caller in April and may not be on zoloft anymore. She also has a history of non compliance with zocor and was instructed to take it by Dr. Murlean Caller in April

## 2014-01-27 NOTE — Telephone Encounter (Signed)
Terry with [employer] [phone] called [facility]  to inform clinic - pt does not take Zoloft and Zocor on regular bases. Insurance company is sending a Education officer, museum out to see pt - did not give a reason. Hilda Blades Glorine Hanratty RN 01/27/14 11:20AM

## 2014-02-11 ENCOUNTER — Other Ambulatory Visit: Payer: Self-pay | Admitting: Internal Medicine

## 2014-03-13 ENCOUNTER — Other Ambulatory Visit: Payer: Self-pay | Admitting: Internal Medicine

## 2014-03-17 ENCOUNTER — Encounter: Payer: Commercial Managed Care - HMO | Admitting: Internal Medicine

## 2014-04-04 ENCOUNTER — Encounter (INDEPENDENT_AMBULATORY_CARE_PROVIDER_SITE_OTHER): Payer: Self-pay | Admitting: Surgery

## 2014-04-12 ENCOUNTER — Other Ambulatory Visit: Payer: Self-pay | Admitting: Internal Medicine

## 2014-05-05 ENCOUNTER — Ambulatory Visit: Payer: Medicaid Other | Admitting: *Deleted

## 2014-05-05 ENCOUNTER — Ambulatory Visit (HOSPITAL_COMMUNITY)
Admission: RE | Admit: 2014-05-05 | Discharge: 2014-05-05 | Disposition: A | Discharge status: Home / Self Care | Payer: Commercial Managed Care - HMO | Source: Ambulatory Visit | Attending: Internal Medicine | Admitting: Internal Medicine

## 2014-05-05 ENCOUNTER — Encounter: Payer: Self-pay | Admitting: Internal Medicine

## 2014-05-05 ENCOUNTER — Ambulatory Visit (INDEPENDENT_AMBULATORY_CARE_PROVIDER_SITE_OTHER): Payer: Commercial Managed Care - HMO | Admitting: Internal Medicine

## 2014-05-05 VITALS — BP 116/84 | HR 73 | Temp 98.0°F | Ht 65.0 in | Wt 175.1 lb

## 2014-05-05 DIAGNOSIS — M542 Cervicalgia: Secondary | ICD-10-CM | POA: Diagnosis present

## 2014-05-05 DIAGNOSIS — Z23 Encounter for immunization: Secondary | ICD-10-CM

## 2014-05-05 DIAGNOSIS — F329 Major depressive disorder, single episode, unspecified: Secondary | ICD-10-CM

## 2014-05-05 DIAGNOSIS — K219 Gastro-esophageal reflux disease without esophagitis: Secondary | ICD-10-CM

## 2014-05-05 DIAGNOSIS — E785 Hyperlipidemia, unspecified: Secondary | ICD-10-CM

## 2014-05-05 DIAGNOSIS — R920 Mammographic microcalcification found on diagnostic imaging of breast: Secondary | ICD-10-CM

## 2014-05-05 DIAGNOSIS — F32A Depression, unspecified: Secondary | ICD-10-CM

## 2014-05-05 HISTORY — DX: Cervicalgia: M54.2

## 2014-05-05 MED ORDER — SIMVASTATIN 40 MG PO TABS: 40.0000 mg | ORAL_TABLET | Freq: Every day | ORAL | Status: DC

## 2014-05-05 MED ORDER — GABAPENTIN 100 MG PO CAPS: 100.0000 mg | ORAL_CAPSULE | Freq: Three times a day (TID) | ORAL | Status: DC

## 2014-05-05 MED ORDER — PANTOPRAZOLE SODIUM 20 MG PO TBEC: 20.0000 mg | DELAYED_RELEASE_TABLET | Freq: Every day | ORAL | Status: DC

## 2014-05-05 MED ORDER — QUETIAPINE FUMARATE ER 200 MG PO TB24: 200.0000 mg | ORAL_TABLET | Freq: Every day | ORAL | Status: DC

## 2014-05-05 MED ORDER — SUMATRIPTAN SUCCINATE 25 MG PO TABS: 25.0000 mg | ORAL_TABLET | ORAL | Status: DC | PRN

## 2014-05-05 NOTE — Assessment & Plan Note (Signed)
-   Will not repeat lipid panel at this time as pt is not compliant with zocor - Importance of compliance explained to patient - Will prescribe zocor again and recheck her lipids in 3 months

## 2014-05-05 NOTE — Patient Instructions (Addendum)
It was a pleasure meeting you today We will get a neck x ray today to evaluate your pain I have started you on gabapentin for your pain I have refilled your zocor, imitrex, protonix and seroquel I have stopped your zoloft for now Please follow up with psychiatry I will also attempt to schedule your mammogram and u/s of your breast Please follow up in 3 months

## 2014-05-05 NOTE — Assessment & Plan Note (Signed)
-   Pt is non compliant with zoloft as it hurts her stomach - I have stopped her zoloft today. She is to f/u with psychiatry as an outpatient - she follows up with [facility] and they have referred her to psych - Will f/u recommendations - Encouraged compliance with seroquel - No suicidal ideation

## 2014-05-05 NOTE — Progress Notes (Signed)
   Subjective:    Patient ID: Theresa Kirk, female    DOB: 19-Nov-1960, 53 y.o.   MRN: [id number]  HPI Pt seen and examined. She is here for routine follow up.  She states she has not been compliant with her zoloft as it makes her "stomach hurt" and she only takes seroquel when she has no activities planned for the next day as it makes her sluggish. She also is not compliant with her protonix as she takes it only when she feels there are GERD symptoms and not daily.   She has been following with [facility] for her depression and was referred to psychiatry from the clinic and will follow up with them today.  Pt also complians of intermittent cervical pain shooting down the back of her neck lasting approx 1 minute at a time, sharp in nature. No radiation down her arms or weakness in her arms.   She also complains of intermittent headaches and states that her head feels "full". She ran out of her imitrex and hasn't gotten it refilled.  She also has not had a repeat mammogram (was to get one in august) to follow up on her previously abnormal one.  Review of Systems  Constitutional: Negative for chills, diaphoresis, activity change, appetite change and fatigue.  Eyes: Negative.   Respiratory: Negative.   Cardiovascular: Negative.   Gastrointestinal: Negative.   Endocrine: Negative.   Musculoskeletal: Positive for neck pain. Negative for back pain, joint swelling, arthralgias and gait problem.  Skin: Negative.   Allergic/Immunologic: Negative.   Neurological: Positive for headaches. Negative for seizures, syncope, speech difficulty, light-headedness and numbness.  Psychiatric/Behavioral: Negative for suicidal ideas, hallucinations, behavioral problems, self-injury and agitation.       Objective:   Physical Exam  Constitutional: She is oriented to person, place, and time. She appears well-developed and well-nourished.  HENT:  Head: Normocephalic and atraumatic.  Eyes:  Conjunctivae are normal. Right eye exhibits no discharge. Left eye exhibits no discharge. No scleral icterus.  Neck: Normal range of motion.  Cardiovascular: Normal rate and regular rhythm.   No murmur heard. Pulmonary/Chest: Effort normal. She has no wheezes.  Abdominal: Soft. Bowel sounds are normal. She exhibits no distension. There is no tenderness.  Musculoskeletal: Normal range of motion. She exhibits no edema.  Neurological: She is alert and oriented to person, place, and time.  Skin: Skin is warm and dry.  Psychiatric: She has a normal mood and affect. Her behavior is normal.          Assessment & Plan:  Please see problem based charting for assessment and plan:

## 2014-05-05 NOTE — Assessment & Plan Note (Addendum)
-   cervical x ray noted - degenerative changes +  - Started on gabapentin for the pain - If persistent or worsening pain/new weakness would consider further imaging with MRI

## 2014-05-05 NOTE — Assessment & Plan Note (Signed)
-   Pt encouraged to take PPI daily for now as she takes it only intermittently and has persistent symptoms - Will monitor on daily use. If no improvement will consider EGD and H. Pylori testing for further w/u

## 2014-05-05 NOTE — Assessment & Plan Note (Signed)
-   Pt has not had her repeat diagnostic mammogram - She was supposed to go in august - It was rescheduled. Will follow up on results and refer back to Dr. Georgette Dover (surgeon)

## 2014-05-12 ENCOUNTER — Other Ambulatory Visit: Payer: Self-pay | Admitting: Internal Medicine

## 2014-05-14 ENCOUNTER — Other Ambulatory Visit: Payer: Self-pay | Admitting: Internal Medicine

## 2014-05-18 ENCOUNTER — Ambulatory Visit
Admission: RE | Admit: 2014-05-18 | Discharge: 2014-05-18 | Disposition: A | Discharge status: Home / Self Care | Payer: Commercial Managed Care - HMO | Source: Ambulatory Visit | Attending: Surgery | Admitting: Surgery

## 2014-06-11 ENCOUNTER — Other Ambulatory Visit: Payer: Self-pay | Admitting: Internal Medicine

## 2014-06-11 DIAGNOSIS — E785 Hyperlipidemia, unspecified: Secondary | ICD-10-CM

## 2014-06-11 DIAGNOSIS — K219 Gastro-esophageal reflux disease without esophagitis: Secondary | ICD-10-CM

## 2014-06-24 ENCOUNTER — Encounter (HOSPITAL_COMMUNITY): Payer: Self-pay

## 2014-06-24 ENCOUNTER — Emergency Department (HOSPITAL_COMMUNITY)
Admission: EM | Admit: 2014-06-24 | Discharge: 2014-06-24 | Disposition: A | Discharge status: Home / Self Care | Payer: Commercial Managed Care - HMO | Attending: Emergency Medicine | Admitting: Emergency Medicine

## 2014-06-24 DIAGNOSIS — F329 Major depressive disorder, single episode, unspecified: Secondary | ICD-10-CM | POA: Insufficient documentation

## 2014-06-24 DIAGNOSIS — M797 Fibromyalgia: Secondary | ICD-10-CM | POA: Diagnosis not present

## 2014-06-24 DIAGNOSIS — E780 Pure hypercholesterolemia: Secondary | ICD-10-CM | POA: Insufficient documentation

## 2014-06-24 DIAGNOSIS — Z87891 Personal history of nicotine dependence: Secondary | ICD-10-CM | POA: Insufficient documentation

## 2014-06-24 DIAGNOSIS — G4489 Other headache syndrome: Secondary | ICD-10-CM | POA: Diagnosis not present

## 2014-06-24 DIAGNOSIS — Z79899 Other long term (current) drug therapy: Secondary | ICD-10-CM | POA: Insufficient documentation

## 2014-06-24 DIAGNOSIS — H5711 Ocular pain, right eye: Secondary | ICD-10-CM | POA: Diagnosis present

## 2014-06-24 DIAGNOSIS — M1990 Unspecified osteoarthritis, unspecified site: Secondary | ICD-10-CM | POA: Insufficient documentation

## 2014-06-24 MED ORDER — FLUORESCEIN SODIUM 1 MG OP STRP
1.0000 | ORAL_STRIP | Freq: Once | OPHTHALMIC | Status: AC
  Administered 2014-06-24: 1 via OPHTHALMIC
  Filled 2014-06-24: qty 1

## 2014-06-24 MED ORDER — METOCLOPRAMIDE HCL 5 MG/ML IJ SOLN
10.0000 mg | Freq: Once | INTRAMUSCULAR | Status: AC
  Administered 2014-06-24: 10 mg via INTRAVENOUS
  Filled 2014-06-24: qty 2

## 2014-06-24 MED ORDER — DIPHENHYDRAMINE HCL 50 MG/ML IJ SOLN
25.0000 mg | Freq: Once | INTRAMUSCULAR | Status: AC
  Administered 2014-06-24: 25 mg via INTRAVENOUS
  Filled 2014-06-24: qty 1

## 2014-06-24 MED ORDER — SODIUM CHLORIDE 0.9 % IV BOLUS (SEPSIS)
1000.0000 mL | Freq: Once | INTRAVENOUS | Status: AC
  Administered 2014-06-24: 1000 mL via INTRAVENOUS

## 2014-06-24 MED ORDER — PROPARACAINE HCL 0.5 % OP SOLN
1.0000 [drp] | Freq: Once | OPHTHALMIC | Status: AC
  Administered 2014-06-24: 1 [drp] via OPHTHALMIC
  Filled 2014-06-24: qty 15

## 2014-06-24 MED ORDER — KETOROLAC TROMETHAMINE 15 MG/ML IJ SOLN
15.0000 mg | Freq: Once | INTRAMUSCULAR | Status: AC
  Administered 2014-06-24: 15 mg via INTRAVENOUS
  Filled 2014-06-24: qty 1

## 2014-06-24 NOTE — ED Provider Notes (Signed)
CSN: [id number]     Arrival date & time 06/24/14  0516 History   First MD Initiated Contact with Patient 06/24/14 0547     Chief Complaint  Patient presents with  . Eye Pain    Patient is a 54 y.o. female presenting with eye pain. The history is provided by the patient. No language interpreter was used.  Eye Pain   Theresa Kirk presents for evaluation of right-sided eye pain and headache. Symptoms started 2 days ago. She initially started with feeling muscle spasms throughout her head that waxed and waned throughout the day and then resolved. Yesterday when she went outside in her right eye hit the light she developed pain in the right eye as well as headache associated with this pain. Pain is better without being exposed to light. Her symptoms improved throughout the day. When she woke up this morning she developed right eye pain and headache when exposed to light again. She has associated nausea but no fevers, vomiting, numbness or weakness. She's had no similar symptoms previously. She wears glasses for reading but does not have an eye doctor. 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 Symptoms are moderate in waxing and waning. Past Medical History  Diagnosis Date  . Depression   . Fibromyalgia   . Carpal tunnel syndrome   . Alcohol abuse   . High cholesterol   . Arthritis    Past Surgical History  Procedure Laterality Date  . Abdominal hysterectomy  2008    h/o uterine fibroids, ?partial hysterectomy per pt 04/13/12  . Colonoscopy    . Knee surgery    . Foot surgery Right    Family History  Problem Relation Age of Onset  . Diabetes Mother   . Hypertension Mother   . Stroke Mother   . Diabetes Sister   . Thyroid disease Sister   . Hypertension Sister   . Cancer Maternal Aunt     unknown   History  Substance Use Topics  . Smoking status: Former Smoker    Quit date: 06/03/2000  . Smokeless tobacco: Never Used  . Alcohol Use: 0.0  oz/week    0 Not specified per week   OB History    Gravida Para Term Preterm AB TAB SAB Ectopic Multiple Living   4 3 3  0 1 1 0 0 0 3     Review of Systems  Eyes: Positive for pain.  All other systems reviewed and are negative.     Allergies  Iohexol  Home Medications   Prior to Admission medications   Medication Sig Start Date End Date Taking? Authorizing Provider  Cetirizine HCl 10 MG CAPS Take 1 capsule (10 mg total) by mouth daily. 12/09/12   Cresenciano Genre, MD  gabapentin (NEURONTIN) 100 MG capsule Take 1 capsule (100 mg total) by mouth 3 (three) times daily. 05/05/14   Nischal Narendra, MD  pantoprazole (PROTONIX) 20 MG tablet Take 1 tablet (20 mg total) by mouth daily. 06/13/14   Karren Cobble, MD  QUEtiapine (SEROQUEL XR) 200 MG 24 hr tablet Take 1 tablet (200 mg total) by mouth at bedtime. 06/13/14   Karren Cobble, MD  simvastatin (ZOCOR) 40 MG tablet Take 1 tablet (40 mg total) by mouth at bedtime. 06/13/14   Karren Cobble, MD  SUMAtriptan (IMITREX) 25 MG tablet Take 1 tablet (25 mg total) by mouth every 2 (two) hours as needed for migraine. 05/05/14  Nischal Dareen Piano, MD   BP 115/66 mmHg  Pulse 82  Temp(Src) 98.6 F (37 C) (Oral)  Resp 18  SpO2 99%  LMP 09/14/2006 Physical Exam  Constitutional: She is oriented to person, place, and time. She appears well-developed and well-nourished.  HENT:  Head: Normocephalic and atraumatic.  Mild frontal and maxillary sinus tenderness bilaterally   Eyes: EOM are normal. Pupils are equal, round, and reactive to light. Right eye exhibits no discharge. Left eye exhibits no discharge.  Mild conjunctival injection on the right. No proptosis. Right eye pressure of 27, left eye pressure of 23  Cardiovascular: Normal rate and regular rhythm.   No murmur heard. Pulmonary/Chest: Effort normal and breath sounds normal. No respiratory distress.  Abdominal: Soft. There is no tenderness. There is no rebound and no guarding.   Musculoskeletal: She exhibits no edema or tenderness.  Neurological: She is alert and oriented to person, place, and time. No cranial nerve deficit.  Skin: Skin is warm and dry.  Psychiatric: She has a normal mood and affect. Her behavior is normal.  Nursing note and vitals reviewed.   ED Course  Procedures (including critical care time) Labs Review Labs Reviewed - No data to display  Imaging Review No results found.   EKG Interpretation None      MDM   Final diagnoses:  Other headache syndrome    Patient here for evaluation of eye pain and headache. There is no evidence of corneal abrasion on fluorescein examination. Patient had minimal improvement in her symptoms after Alcaine drops. Patient did have relief of her symptoms after headache cocktail. Feels subarachnoid hemorrhage, temporal arteritis, acute angle closure glaucoma are all very unlikely. Patient does have minimally elevated globe pressures that need further follow-up with ophthalmology. Do not feel that this is contributing to her current headache today. Discussed with patient importance of ophthalmology follow-up as well as return precautions.    Theresa Reichert, MD 06/24/14 [phone]

## 2014-06-24 NOTE — Discharge Instructions (Signed)

## 2014-06-24 NOTE — ED Notes (Signed)
Pt reports sensitivity to light with intermittent blurred vision x 1 day. States looking at light causes a HA. Pt is also reports "muscle spasms to my forehead." Pt is AO x4. Neuro intact.

## 2014-06-27 ENCOUNTER — Other Ambulatory Visit: Payer: Self-pay | Admitting: Internal Medicine

## 2014-06-27 DIAGNOSIS — H40053 Ocular hypertension, bilateral: Secondary | ICD-10-CM | POA: Insufficient documentation

## 2014-07-11 ENCOUNTER — Other Ambulatory Visit: Payer: Self-pay | Admitting: Internal Medicine

## 2014-07-20 ENCOUNTER — Observation Stay (HOSPITAL_COMMUNITY): Payer: Commercial Managed Care - HMO

## 2014-07-20 ENCOUNTER — Encounter (HOSPITAL_COMMUNITY): Payer: Self-pay | Admitting: General Practice

## 2014-07-20 ENCOUNTER — Ambulatory Visit (INDEPENDENT_AMBULATORY_CARE_PROVIDER_SITE_OTHER): Payer: Commercial Managed Care - HMO | Admitting: Internal Medicine

## 2014-07-20 ENCOUNTER — Encounter: Payer: Self-pay | Admitting: Internal Medicine

## 2014-07-20 ENCOUNTER — Observation Stay (HOSPITAL_COMMUNITY)
Admission: AD | Admit: 2014-07-20 | Discharge: 2014-07-21 | Disposition: A | Discharge status: Home / Self Care | Payer: Commercial Managed Care - HMO | Source: Ambulatory Visit | Attending: Internal Medicine | Admitting: Internal Medicine

## 2014-07-20 VITALS — BP 126/77 | HR 77 | Temp 98.0°F | Ht 65.0 in | Wt 175.7 lb

## 2014-07-20 DIAGNOSIS — M5410 Radiculopathy, site unspecified: Secondary | ICD-10-CM | POA: Diagnosis not present

## 2014-07-20 DIAGNOSIS — I10 Essential (primary) hypertension: Secondary | ICD-10-CM | POA: Diagnosis not present

## 2014-07-20 DIAGNOSIS — Z87891 Personal history of nicotine dependence: Secondary | ICD-10-CM | POA: Diagnosis not present

## 2014-07-20 DIAGNOSIS — F1290 Cannabis use, unspecified, uncomplicated: Secondary | ICD-10-CM | POA: Diagnosis not present

## 2014-07-20 DIAGNOSIS — R2990 Unspecified symptoms and signs involving the nervous system: Secondary | ICD-10-CM | POA: Diagnosis present

## 2014-07-20 DIAGNOSIS — F32A Depression, unspecified: Secondary | ICD-10-CM | POA: Diagnosis present

## 2014-07-20 DIAGNOSIS — K219 Gastro-esophageal reflux disease without esophagitis: Secondary | ICD-10-CM | POA: Diagnosis not present

## 2014-07-20 DIAGNOSIS — R29810 Facial weakness: Secondary | ICD-10-CM | POA: Diagnosis not present

## 2014-07-20 DIAGNOSIS — Z79899 Other long term (current) drug therapy: Secondary | ICD-10-CM | POA: Diagnosis not present

## 2014-07-20 DIAGNOSIS — N63 Unspecified lump in breast: Secondary | ICD-10-CM | POA: Diagnosis not present

## 2014-07-20 DIAGNOSIS — M797 Fibromyalgia: Secondary | ICD-10-CM | POA: Insufficient documentation

## 2014-07-20 DIAGNOSIS — H209 Unspecified iridocyclitis: Secondary | ICD-10-CM | POA: Diagnosis not present

## 2014-07-20 DIAGNOSIS — F332 Major depressive disorder, recurrent severe without psychotic features: Secondary | ICD-10-CM | POA: Diagnosis present

## 2014-07-20 DIAGNOSIS — R29898 Other symptoms and signs involving the musculoskeletal system: Secondary | ICD-10-CM | POA: Diagnosis not present

## 2014-07-20 DIAGNOSIS — F329 Major depressive disorder, single episode, unspecified: Secondary | ICD-10-CM | POA: Insufficient documentation

## 2014-07-20 DIAGNOSIS — R51 Headache: Secondary | ICD-10-CM | POA: Insufficient documentation

## 2014-07-20 DIAGNOSIS — R200 Anesthesia of skin: Secondary | ICD-10-CM | POA: Diagnosis present

## 2014-07-20 DIAGNOSIS — E785 Hyperlipidemia, unspecified: Secondary | ICD-10-CM | POA: Diagnosis not present

## 2014-07-20 LAB — COMPREHENSIVE METABOLIC PANEL
ALT: 27 U/L (ref 0–35)
AST: 24 U/L (ref 0–37)
Albumin: 4.2 g/dL (ref 3.5–5.2)
Alkaline Phosphatase: 75 U/L (ref 39–117)
Anion gap: 5 (ref 5–15)
BUN: 13 mg/dL (ref 6–23)
CALCIUM: 9.2 mg/dL (ref 8.4–10.5)
CO2: 27 mmol/L (ref 19–32)
Chloride: 106 mmol/L (ref 96–112)
Creatinine, Ser: 0.87 mg/dL (ref 0.50–1.10)
GFR calc Af Amer: 87 mL/min — ABNORMAL LOW (ref >90)
GFR calc non Af Amer: 75 mL/min — ABNORMAL LOW (ref >90)
Glucose, Bld: 106 mg/dL — ABNORMAL HIGH (ref 70–99)
Potassium: 3.7 mmol/L (ref 3.5–5.1)
SODIUM: 138 mmol/L (ref 135–145)
Total Bilirubin: 0.3 mg/dL (ref 0.3–1.2)
Total Protein: 7.2 g/dL (ref 6.0–8.3)

## 2014-07-20 LAB — CBC
HCT: 38.3 % (ref 36.0–46.0)
Hemoglobin: 12.5 g/dL (ref 12.0–15.0)
MCH: 30.6 pg (ref 26.0–34.0)
MCHC: 32.6 g/dL (ref 30.0–36.0)
MCV: 93.6 fL (ref 78.0–100.0)
Platelets: 272 10*3/uL (ref 150–400)
RBC: 4.09 MIL/uL (ref 3.87–5.11)
RDW: 13.1 % (ref 11.5–15.5)
WBC: 10.2 10*3/uL (ref 4.0–10.5)

## 2014-07-20 LAB — PHOSPHORUS: Phosphorus: 3.8 mg/dL (ref 2.3–4.6)

## 2014-07-20 LAB — TSH: TSH: 1.348 u[IU]/mL (ref 0.350–4.500)

## 2014-07-20 LAB — MAGNESIUM: MAGNESIUM: 2.1 mg/dL (ref 1.5–2.5)

## 2014-07-20 MED ORDER — HEPARIN SODIUM (PORCINE) 5000 UNIT/ML IJ SOLN
5000.0000 [IU] | Freq: Three times a day (TID) | INTRAMUSCULAR | Status: DC
  Administered 2014-07-20 – 2014-07-21 (×2): 5000 [IU] via SUBCUTANEOUS
  Filled 2014-07-20 (×2): qty 1

## 2014-07-20 MED ORDER — GABAPENTIN 100 MG PO CAPS
100.0000 mg | ORAL_CAPSULE | Freq: Three times a day (TID) | ORAL | Status: DC
  Administered 2014-07-20 – 2014-07-21 (×2): 100 mg via ORAL
  Filled 2014-07-20 (×2): qty 1

## 2014-07-20 MED ORDER — SUMATRIPTAN SUCCINATE 25 MG PO TABS
25.0000 mg | ORAL_TABLET | ORAL | Status: DC | PRN
  Filled 2014-07-20: qty 1

## 2014-07-20 MED ORDER — SENNOSIDES-DOCUSATE SODIUM 8.6-50 MG PO TABS: 1.0000 | ORAL_TABLET | Freq: Every evening | ORAL | Status: DC | PRN

## 2014-07-20 MED ORDER — PANTOPRAZOLE SODIUM 20 MG PO TBEC
20.0000 mg | DELAYED_RELEASE_TABLET | Freq: Every day | ORAL | Status: DC
  Administered 2014-07-21: 20 mg via ORAL
  Filled 2014-07-20: qty 1

## 2014-07-20 MED ORDER — CLONIDINE HCL 0.1 MG PO TABS: 0.1000 mg | ORAL_TABLET | Freq: Every day | ORAL | Status: DC

## 2014-07-20 MED ORDER — SIMVASTATIN 40 MG PO TABS
40.0000 mg | ORAL_TABLET | Freq: Every day | ORAL | Status: DC
  Administered 2014-07-20: 40 mg via ORAL
  Filled 2014-07-20: qty 1

## 2014-07-20 MED ORDER — SODIUM CHLORIDE 0.9 % IJ SOLN
3.0000 mL | Freq: Two times a day (BID) | INTRAMUSCULAR | Status: DC
  Administered 2014-07-20 – 2014-07-21 (×2): 3 mL via INTRAVENOUS

## 2014-07-20 MED ORDER — ESCITALOPRAM OXALATE 10 MG PO TABS
10.0000 mg | ORAL_TABLET | Freq: Every day | ORAL | Status: DC
  Administered 2014-07-21: 10 mg via ORAL
  Filled 2014-07-20: qty 1

## 2014-07-20 MED ORDER — STROKE: EARLY STAGES OF RECOVERY BOOK
Freq: Once | Status: AC
  Administered 2014-07-20: 20:00:00

## 2014-07-20 MED ORDER — LORATADINE 10 MG PO TABS
10.0000 mg | ORAL_TABLET | Freq: Every day | ORAL | Status: DC
  Administered 2014-07-20 – 2014-07-21 (×2): 10 mg via ORAL
  Filled 2014-07-20 (×2): qty 1

## 2014-07-20 MED ORDER — ASPIRIN EC 325 MG PO TBEC
325.0000 mg | DELAYED_RELEASE_TABLET | Freq: Every day | ORAL | Status: DC
  Administered 2014-07-20 – 2014-07-21 (×2): 325 mg via ORAL
  Filled 2014-07-20 (×2): qty 1

## 2014-07-20 MED ORDER — QUETIAPINE FUMARATE ER 200 MG PO TB24
200.0000 mg | ORAL_TABLET | Freq: Every day | ORAL | Status: DC
  Administered 2014-07-20: 200 mg via ORAL
  Filled 2014-07-20 (×2): qty 1

## 2014-07-20 NOTE — Progress Notes (Addendum)
RN stroke swallow screen completed, pt passed. Pt was kept strictly NPO prior to passing, diet now ordered.

## 2014-07-20 NOTE — Progress Notes (Signed)
   Subjective:   Patient ID: Theresa Kirk female   DOB: 01/12/1961 54 y.o.   MRN: [id number]  HPI: [name] is a 54 y.o. men with past medical history as listed below presents for acute worsening right lower extremity weakness.  The patient states that over the past week she has noticed increased numbness tingling in her right leg along with weakness that has caused some trouble ambulating. Around the same time. She has also noticed some facial droop and dysarthria with the last episode being yesterday. She denies any chest pain, short of breath, dyspnea on exertion, lower extremity edema, headache or blurry vision. She has not had any other weakness or paresthesias above her baseline. She denied any saddle anesthesia, bladder/urinary incontinence, or back pain. She has been intermittently compliant with her medications and does continue to smoke.   Past Medical History  Diagnosis Date  . Depression   . Fibromyalgia   . Carpal tunnel syndrome   . Alcohol abuse   . High cholesterol   . Arthritis    No current outpatient prescriptions on file.   No current facility-administered medications for this visit.   Family History  Problem Relation Age of Onset  . Diabetes Mother   . Hypertension Mother   . Stroke Mother   . Diabetes Sister   . Thyroid disease Sister   . Hypertension Sister   . Cancer Maternal Aunt     unknown   History   Social History  . Marital Status: Single    Spouse Name: N/A  . Number of Children: N/A  . Years of Education: N/A   Social History Main Topics  . Smoking status: Former Smoker    Quit date: 06/03/2000  . Smokeless tobacco: Never Used  . Alcohol Use: 0.0 oz/week    0 Standard drinks or equivalent per week     Comment: Sometimes.  . Drug Use: Yes    Special: Marijuana  . Sexual Activity: Not on file   Other Topics Concern  . None   Social History Narrative   Lives with mother in [place].   Currently unemployed.   Used  to work at [employer] in [place].   ETOH use: weekend, 1-2 beers from [employer]: used to drink everyday   Former smoker   Current THC use   H/o cocaine use: quit in 2001.   Review of Systems: Pertinent items are noted in HPI. Objective:  Physical Exam: Filed Vitals:   07/20/14 1356  BP: 126/77  Pulse: 77  Temp: 98 F (36.7 C)  TempSrc: Oral  Height: 5\' 5"  (1.651 m)  Weight: 175 lb 11.2 oz (79.697 kg)  SpO2: 100%   General: Sitting in chair, NAD HEENT: PERRL, EOMI, no scleral icterus Cardiac: RRR, no rubs, murmurs or gallops Pulm: clear to auscultation bilaterally, no crackles wheezes or rhonchi moving normal volumes of air Abd: soft, nontender, nondistended, BS present Ext: warm and well perfused, no pedal edema, 5 out of 5 upper extremity strength, normal sensation upper extremities, normal sensation lower extremity, 4/5 right lower extremity strength, 5/5 lower left extremity strength, 2+ DTRs upper extremities bilaterally, slight hyporeflexia the right Neuro: alert and oriented X3, cranial nerves II-XII grossly intact, no facial droop, dysarthria   Assessment & Plan:  Given the acuity of patient's symptoms and physical exam findings she was admitted to the hospital for further workup.  Pt discussed with Dr. Dareen Piano

## 2014-07-20 NOTE — Progress Notes (Signed)
Pt arrived to 4N01 at 1535.  Pt A&O x 4, c/o 10/10 RLE pain, describes the pain as "pins and needles"   Pt V/S taken,  MD paged, no new orders. Pt without distress.  Family at the bedside.Will monitor.

## 2014-07-20 NOTE — H&P (Signed)
Date: 07/20/2014               Patient Name:  Theresa Kirk MRN: [id number]  DOB: 1961/05/23 Age / Sex: 54 y.o., female   PCP: Theresa Contes, MD         Medical Service: Internal Medicine Teaching Service         Attending Physician: Dr. Madilyn Fireman, MD    First Contact: Dr. Lottie Kirk Pager: [phone]  Second Contact: Dr. Duwaine Kirk Pager: [phone]       After Hours (After 5p/  First Contact Pager: [phone]  weekends / holidays): Second Contact Pager: [phone]   Chief Complaint: R leg pain, numbness  History of Present Illness: Theresa Kirk is a 54 year old woman with HTN, HL, fibromyalgia who presented to [facility] with R>L lower extremity pain, weakness, and numbness. She says for the past three weeks her R>L legs have been painful when standing on her concrete floor with associated weakness, numbness, and paresthesia. She describes the pain as sometimes cramping and sometimes sharp. She also notes numbness and paresthesia without the pain in both arms as more of a chronic issue (she would not put down a hard date). She made an appointment to come to [facility] when on Monday 2/15 she noted difficulty speaking and thought her right mouth was droopy. This only lasted maybe fifteen minutes and then resolved. She has not had this since Monday. She has no stroke history. She quit smoking in 2002 but smokes marijuana daily including this morning. No other drugs. Otherwise denies chest pain, SOB, headache, vision changes, abdominal pain, nausea, emesis, fevers, chills night sweats.  Meds: Current Facility-Administered Medications  Medication Dose Route Frequency Provider Last Rate Last Dose  .  stroke: mapping our early stages of recovery book   Does not apply Once Theresa Bellows, MD      . aspirin EC tablet 325 mg  325 mg Oral Daily Theresa Bellows, MD      . Derrill Memo ON 07/21/2014] escitalopram (LEXAPRO) tablet 10 mg  10 mg Oral Daily Theresa Bellows, MD      . gabapentin (NEURONTIN) capsule 100  mg  100 mg Oral TID Theresa Bellows, MD      . heparin injection 5,000 Units  5,000 Units Subcutaneous 3 times per day Theresa Bellows, MD      . loratadine (CLARITIN) tablet 10 mg  10 mg Oral Daily Theresa Bellows, MD      . Derrill Memo ON 07/21/2014] pantoprazole (PROTONIX) EC tablet 20 mg  20 mg Oral Daily Theresa Bellows, MD      . QUEtiapine (SEROQUEL XR) 24 hr tablet 200 mg  200 mg Oral QHS Theresa Bellows, MD      . senna-docusate (Senokot-S) tablet 1 tablet  1 tablet Oral QHS PRN Theresa Bellows, MD      . simvastatin (ZOCOR) tablet 40 mg  40 mg Oral QHS Theresa Bellows, MD      . sodium chloride 0.9 % injection 3 mL  3 mL Intravenous Q12H Theresa Bellows, MD      . SUMAtriptan (IMITREX) tablet 25 mg  25 mg Oral Q2H PRN Theresa Bellows, MD        Allergies: Allergies as of 07/20/2014 - Review Complete 07/20/2014  Allergen Reaction Noted  . Iohexol  06/08/2013   Past Medical History  Diagnosis Date  . Depression   . Fibromyalgia   . Carpal tunnel syndrome   .  Alcohol abuse   . High cholesterol   . Arthritis    Past Surgical History  Procedure Laterality Date  . Abdominal hysterectomy  2008    h/o uterine fibroids, ?partial hysterectomy per pt 04/13/12  . Colonoscopy    . Knee surgery    . Foot surgery Right    Family History  Problem Relation Age of Onset  . Diabetes Mother   . Hypertension Mother   . Stroke Mother   . Diabetes Sister   . Thyroid disease Sister   . Hypertension Sister   . Cancer Maternal Aunt     unknown   History   Social History  . Marital Status: Single    Spouse Name: N/A  . Number of Children: N/A  . Years of Education: N/A   Occupational History  . Not on file.   Social History Main Topics  . Smoking status: Former Smoker    Quit date: 06/03/2000  . Smokeless tobacco: Never Used  . Alcohol Use: 0.0 oz/week    0 Standard drinks or equivalent per week     Comment: Sometimes.  . Drug Use: Yes    Special: Marijuana  . Sexual  Activity: Not on file   Other Topics Concern  . Not on file   Social History Narrative   Lives with mother in [place].   Currently unemployed.   Used to work at [employer] in [place].   ETOH use: weekend, 1-2 beers from [employer]: used to drink everyday   Former smoker   Current THC use   H/o cocaine use: quit in 2001.    Review of Systems: A comprehensive review of systems was negative except for: as noted above per HPI  Physical Exam: Blood pressure 127/72, pulse 55, temperature 97.7 F (36.5 C), temperature source Oral, resp. rate 14, height 5\' 5"  (1.651 m), weight 175 lb (79.379 kg), last menstrual period 09/14/2006, SpO2 98 %.  Gen: A&O x 4, no acute distress, well developed, well nourished HEENT: Atraumatic, PERRL, EOMI, sclerae anicteric, moist mucous membranes Heart: Regular rate and rhythm, normal S1 S2, no murmurs, rubs, or gallops Lungs: Clear to auscultation bilaterally, respirations unlabored Abd: Soft, non-tender, non-distended, + bowel sounds, no hepatosplenomegaly Ext: No edema or cyanosis Neuro: A&O x 4, CN intact other than decreased sensation on R V2, V3, coordination intact on finger-nose-finger and heel-to-shin, strength 5/5 and symmetric in all extremities, sensation grossly intact, no Babinski  Lab results: none  Imaging results:  No results found.  Other results: EKG: none  Assessment & Plan by Problem: Active Problems:   Stroke-like symptoms  #Stroke-like symptoms: Theresa Kirk presented with complaints initially concerning for stroke such as dysarthria, R>L LE numbness and paresthesia. However, her exam was benign other than questionable decreased sensation in R V2 and V3. This is likely a radiculopathy given the pain and chronicity of the issue.  -head CT wo contrast -consider TIA work-up including MRI/MRA head brain wo contrast, 2d echo w contrast, carotid dopplers -consider MRI c versus l spine -check CBC, CMP, hemoglobin A1c, lipid panel, TSH,  Mg, Ph -EKG -ASA 325 mg po daily -NPO until pass swallow screen -PT/OT -neuro checks q2h -cardiac monitoring  #HL: Last lipid profile 05/13/13 with cholesterol 246, LDL 171, HDL 47, triglyceride 139. At home the patient is taking simvastatin 40 mg qhs. -cont simvastatin 40 mg qhs  #R breast mass: b/l mammogram and R breast ultrasound on 05/18/14 with stable probably benign L breast calcifications and stable probably benign  R breast masses BI-RADS category 3 with recommendation for repeat in 6 months. -repeat b/l diagnostic mammography and R breast u/s in 10/2014  #Fibromyalgia: At home she is taking gabapentin 100 mg tid. -cont gabapentin 100 mg tid  #Depression: At home she is taking lexapro 10 mg daily and seroquel 200 mg qhs -cont lexapro 10 mg daily and seroquel 200 mg qhs  #Diet: heart  #DVT PPx: heparin 5000 u Lambert tid  #Code: full  Dispo: Disposition is deferred at this time, awaiting improvement of current medical problems. Anticipated discharge in approximately 1-2 day(s).   The patient does have a current PCP (Theresa Contes, MD) and does need an [facility] hospital follow-up appointment after discharge.  The patient does not know have transportation limitations that hinder transportation to clinic appointments.  Signed: Kelby Aline, MD 07/20/2014, 6:46 PM

## 2014-07-20 NOTE — Progress Notes (Signed)
MD paged  again,  No new orders at this time.

## 2014-07-20 NOTE — Assessment & Plan Note (Signed)
The patient has known degenerative lumbar spine disease and degenerative cervical spine disease per recent imaging. Although the patient has had transiently worsening of right leg weakness and dysarthria and facial droop with the last episode being on 07/19/14. This is concerning for possible TIAs the patient has several risk factors including hypertension, hyperlipidemia, and smoking. -The patient was admitted for further evaluation and may need subsequent imaging such as MRI of the lumbar spine

## 2014-07-20 NOTE — H&P (Signed)
Date: 07/20/2014               Patient Name:  Theresa Kirk MRN: [id number]  DOB: 1961/05/22 Age / Sex: 54 y.o., female   PCP: Aldine Contes, MD              Medical Service: Internal Medicine Teaching Service              Attending Physician: Dr. Madilyn Fireman, MD    First Contact: Gillian Scarce, MS 3 Pager: [phone]  Second Contact: Dr. Lottie Mussel Pager: [phone]  Third Contact Dr. Duwaine Maxin Pager: [phone]       After Hours (After 5p/  First Contact Pager: [phone]  weekends / holidays): Second Contact Pager: [phone]   Chief Complaint:  "facial weakness and dysarthria"  History of Present Illness:  Theresa Kirk is a 54 y/o female with hyperlipidemia who presented to clinic today c/o facial weakness and difficulty with speech.  Pt was subsequently informed to report here for inpatient admission.  Pt states she has been having "numbness" in her lower extremities, R>L.  Reports that symptoms started about 3 weeks ago and have worsened.  She denies having these symptoms before.  States that she has difficulty walking because her leg feels "like it's sleep."  Symptoms worsen with standing and walking but improves with lying down.  Does not recall any trauma or recent injury.  The facial weakness happened on Monday.  She reports that she was in the mirror and noticed that her face looked "sideways."  This lasted for about 15 mins and went away after she "just messed with it" and rubbed it to see if it would go away.  She also reports having numbness in her L arm but these symptoms have been going on for awhile.  Pt was concerned that all of her symptoms were worsening so she moved her appointment to today (07/20/14).  She also wanted to make sure it wasn't a stroke.  Denies new onset of headache, vision problems, gait disturbances, or decreased sensation.  Pt endorses smoking marijuana on a regular basis and would "smoke more if she could get her hands on it."    Meds:  No current  facility-administered medications on file prior to encounter.   Current Outpatient Prescriptions on File Prior to Encounter  Medication Sig Dispense Refill  . Cetirizine HCl 10 MG CAPS Take 1 capsule (10 mg total) by mouth daily. (Patient taking differently: Take 10 mg by mouth daily as needed (allergies). ) 30 capsule 6  . cloNIDine (CATAPRES) 0.1 MG tablet Take 0.1 mg by mouth at bedtime.    Marland Kitchen escitalopram (LEXAPRO) 10 MG tablet Take 10 mg by mouth daily.    Marland Kitchen gabapentin (NEURONTIN) 100 MG capsule Take 1 capsule (100 mg total) by mouth 3 (three) times daily. 90 capsule 1  . pantoprazole (PROTONIX) 20 MG tablet take 1 tablet by mouth once daily 30 tablet 3  . SEROQUEL XR 200 MG 24 hr tablet take 1 tablet by mouth at bedtime 30 tablet 3  . simvastatin (ZOCOR) 40 MG tablet take 1 tablet by mouth at bedtime 30 tablet 3  . SUMAtriptan (IMITREX) 25 MG tablet Take 1 tablet (25 mg total) by mouth every 2 (two) hours as needed for migraine. 20 tablet 0    Allergies: Allergies as of 07/20/2014 - Review Complete 07/20/2014  Allergen Reaction Noted  . Iohexol  06/08/2013   Past Medical History  Diagnosis Date  . Depression   .  Fibromyalgia   . Carpal tunnel syndrome   . Alcohol abuse   . High cholesterol   . Arthritis    Past Surgical History  Procedure Laterality Date  . Abdominal hysterectomy  2008    h/o uterine fibroids, ?partial hysterectomy per pt 04/13/12  . Colonoscopy    . Knee surgery    . Foot surgery Right    Family History  Problem Relation Age of Onset  . Diabetes Mother   . Hypertension Mother   . Stroke Mother   . Diabetes Sister   . Thyroid disease Sister   . Hypertension Sister   . Cancer Maternal Aunt     unknown   History   Social History  . Marital Status: Single    Spouse Name: N/A  . Number of Children: N/A  . Years of Education: N/A   Occupational History  . Not on file.   Social History Main Topics  . Smoking status: Former Smoker    Quit  date: 06/03/2000  . Smokeless tobacco: Never Used  . Alcohol Use: 0.0 oz/week    0 Standard drinks or equivalent per week     Comment: Sometimes.  . Drug Use: Yes    Special: Marijuana  . Sexual Activity: Not on file   Other Topics Concern  . Not on file   Social History Narrative   Lives with mother in [place].   Currently unemployed.   Used to work at [employer] in [place].   ETOH use: weekend, 1-2 beers from [employer]: used to drink everyday   Former smoker   Current THC use   H/o cocaine use: quit in 2001.    Review of Systems: A comprehensive review of systems was negative except for: Neurological: positive for paresthesia  Physical Exam: Blood pressure 140/74, pulse 58, temperature 98.1 F (36.7 C), temperature source Oral, resp. rate 16, height 5\' 5"  (1.651 m), weight 79.379 kg (175 lb), last menstrual period 09/14/2006, SpO2 100 %. General appearance: alert and cooperative Lungs: clear to auscultation bilaterally Heart: regular rate and rhythm, S1, S2 normal, no murmur, click, rub or gallop Extremities: extremities normal, atraumatic, no cyanosis or edema Neuro:  CN II, III:  Pupils equal and reactive bilaterally CN III, IV, VI:  Extraocular movement intact bilaterally CN V:  Normal and equal sensation in V1 distribution.  Decreased sensation with R V2 and V3 distribution.   CN VII:  Normal facial strength.  Symmetric smile  CN VIII:  Hearing grossly intact CN X:  Bilateral palate elevation CN XI:  Full ROM and strength  CN XII:  Normal tongue strength and motion  Motor:  5/5 Upper and Lower extremity  Normal heel-to-shin, finger-to-nose, and rapid alternating movement  Labs pending  Imaging results:  Head CT ordered  Assessment & Plan by Problem: #Transient Ischemic Attack.  Pt with multiple risk factors such as hyperlipidemia, a past social history of smoking (quit 2002), and elevated glucose and Hemoglobin A1C.  Given the transient facial weakness and  dysarthria, a TIA is possible.      -Head CT scan to r/o  #radiculopathy -Possible MRI of spine to r/o spinal cord compression -PT/OT  #Hyperlipidemia -Repeat lipid panel.  Most recent lipid panel 05/13/2013 -Consider changing current statin or encourage pt to be more compliant.         #PreDiabetes -Hemoglobin A1C (03/19/2012) was 6.0%.  Repeat HA1c -Comprehensive Metabolic Panel to check glucose   This is a Careers information officer Note.  The care of  the patient was discussed with Dr. Ellwood Dense and the assessment and plan was formulated with their assistance.  Please see their note for official documentation of the patient encounter.   Signed: Clois Dupes, Med Student 07/20/2014, 5:20 PM

## 2014-07-21 DIAGNOSIS — I10 Essential (primary) hypertension: Secondary | ICD-10-CM | POA: Diagnosis not present

## 2014-07-21 DIAGNOSIS — E785 Hyperlipidemia, unspecified: Secondary | ICD-10-CM | POA: Diagnosis not present

## 2014-07-21 DIAGNOSIS — R2990 Unspecified symptoms and signs involving the nervous system: Secondary | ICD-10-CM | POA: Diagnosis not present

## 2014-07-21 DIAGNOSIS — M797 Fibromyalgia: Secondary | ICD-10-CM | POA: Diagnosis not present

## 2014-07-21 DIAGNOSIS — M5410 Radiculopathy, site unspecified: Secondary | ICD-10-CM | POA: Diagnosis not present

## 2014-07-21 LAB — LIPID PANEL
CHOL/HDL RATIO: 5.9 ratio
Cholesterol: 176 mg/dL (ref 0–200)
HDL: 30 mg/dL — ABNORMAL LOW (ref >39)
LDL Cholesterol: 120 mg/dL — ABNORMAL HIGH (ref 0–99)
Triglycerides: 128 mg/dL (ref <150)
VLDL: 26 mg/dL (ref 0–40)

## 2014-07-21 LAB — C-REACTIVE PROTEIN: CRP: <0.5 mg/dL — ABNORMAL LOW (ref <0.60)

## 2014-07-21 LAB — SEDIMENTATION RATE: Sed Rate: 5 mm/hr (ref 0–22)

## 2014-07-21 NOTE — Discharge Instructions (Signed)
It was a pleasure to care for you at [facility]. PLEASE GO STRAIGHT TO DR CHOI FOR YOUR EYE APPOINTMENT. Please attend your follow-up appointment with Dr Algis Liming on 2/24 . Please return to the Emergency Department or seek medical attention if you have any new or worsening facial droop, trouble speaking or other worrisome medical condition.   Lottie Mussel, MD

## 2014-07-21 NOTE — Discharge Summary (Signed)
Name: Theresa Kirk MRN: [id number] DOB: [birth year] 54 y.o. PCP: Aldine Contes, MD  Date of Admission: 07/20/2014  3:30 PM Date of Discharge: 07/21/2014 Attending Physician: No att. providers found  Discharge Diagnosis:  Principal Problem:   Stroke-like symptoms Active Problems:   Hyperlipidemia   Depression   GERD (gastroesophageal reflux disease)  Discharge Medications:   Medication List    STOP taking these medications        cloNIDine 0.1 MG tablet  Commonly known as:  CATAPRES      TAKE these medications        Cetirizine HCl 10 MG Caps  Take 1 capsule (10 mg total) by mouth daily.     escitalopram 10 MG tablet  Commonly known as:  LEXAPRO  Take 10 mg by mouth daily.     gabapentin 100 MG capsule  Commonly known as:  NEURONTIN  Take 1 capsule (100 mg total) by mouth 3 (three) times daily.     pantoprazole 20 MG tablet  Commonly known as:  PROTONIX  take 1 tablet by mouth once daily     SEROQUEL XR 200 MG 24 hr tablet  Generic drug:  QUEtiapine  take 1 tablet by mouth at bedtime     simvastatin 40 MG tablet  Commonly known as:  ZOCOR  take 1 tablet by mouth at bedtime     SUMAtriptan 25 MG tablet  Commonly known as:  IMITREX  Take 1 tablet (25 mg total) by mouth every 2 (two) hours as needed for migraine.        Disposition and follow-up:   Theresa.Theresa Kirk was discharged from [facility] in Stable condition.  At the hospital follow up visit please address:  1.  Leg pain, any further neurologic symptoms, eye condition  2.  Labs / imaging needed at time of follow-up: consider MRI c v l spine  3.  Pending labs/ test needing follow-up: hemoglobin A1c, CRP  Follow-up Appointments: Follow-up Information    Follow up with Clinton Gallant, MD On 07/27/2014.   Specialty:  Internal Medicine   Why:  @ 3:45 pm   Contact information:   [place] [address] [phone]       Follow up with Dr Maylene Roes [facility]  Today.   Contact information:   [address]  [place] [address]      Discharge Instructions: Discharge Instructions    Diet - low sodium heart healthy    Complete by:  As directed      Increase activity slowly    Complete by:  As directed            Consultations:    Procedures Performed:  Ct Head Wo Contrast  07/20/2014   CLINICAL DATA:  Bilateral arm and leg numbness. Stroke-like symptoms. Headache.  EXAM: CT HEAD WITHOUT CONTRAST  TECHNIQUE: Contiguous axial images were obtained from the base of the skull through the vertex without intravenous contrast.  COMPARISON:  05/07/2011  FINDINGS: No mass lesion. No midline shift. No acute hemorrhage or hematoma. No extra-axial fluid collections. No evidence of acute infarction. Brain parenchyma is normal. Osseous structures are normal.  IMPRESSION: Normal exam.   Electronically Signed   By: Lorriane Shire M.D.   On: 07/20/2014 21:12   Admission HPI: Theresa Kirk is a 54 year old woman with HTN, HL, fibromyalgia who presented to [facility] with R>L lower extremity pain, weakness, and numbness. She says  for the past three weeks her R>L legs have been painful when standing on her concrete floor with associated weakness, numbness, and paresthesia. She describes the pain as sometimes cramping and sometimes sharp. She also notes numbness and paresthesia without the pain in both arms as more of a chronic issue (she would not put down a hard date). She made an appointment to come to [facility] when on Monday 2/15 she noted difficulty speaking and thought her right mouth was droopy. This only lasted maybe fifteen minutes and then resolved. She has not had this since Monday. She has no stroke history. She quit smoking in 2002 but smokes marijuana daily including this morning. No other drugs. Otherwise denies chest pain, SOB, headache, vision changes, abdominal pain, nausea, emesis, fevers, chills night sweats.  Hospital Course by problem  list: Principal Problem:   Stroke-like symptoms Active Problems:   Hyperlipidemia   Depression   GERD (gastroesophageal reflux disease)   #Radiculopathy: Theresa Kirk was initially admitted for concern of TIA but her neuro exam is benign, CT head negative. She had had no further neuro complaints since that brief episode of difficulty speaking which is unclear. She said she was able to rub her face to make it improve? Her R?L b/l LE pain is more likely radiculopathy. She said it was improved the morning of discharge as she has been off her legs while resting in her hospital bed. She has no further neurologic complaints and denied any dysarthria or facial droop. The chronicity of this numbness, paresthesia, pain suggests radiculopathy. Last spine MRI l spine 06/2013 w no significant disc protrusion or stenosis, mild facet hypertrophy. We explained this to her and she was agreeable with considering out-patient MRI. Her lipid panel is listed above but hemoglobin A1c pending.  #Iridocyclitis OD: the morning of discharge, Theresa Kirk reported acute onset R eye pain and photophobia. The eye was injected on exam but pupil reactive to light and pressures felt grossly symmetric between eyes. She also had a R sided headache. ESR obtained but 5. She does have recently treated iridocyclitis. We requested copy of records from Dr Talbert Forest (ophthalmologist) who noted she recently completed a durazol taper. I spoke to him on the phone and he said this is likely a rebound iritis from her taper being too quick. He was very confident that this is the issue and said his associate Dr Maylene Roes in his [place] office would see her the afternoon of discharge if she is cleared for discharge for other issues. Theresa Kirk verbalized clear understanding of the issue and that she is to go straight to Dr Jeannine Kitten office following discharge to receive out-patient treatment of her iritis.   #HL: Lipid profile this admission w cholesterol 176, LDL  120, HDL 30, triglyceride 128. At home the patient is taking simvastatin 40 mg qhs which was continued.  #R breast mass: b/l mammogram and R breast ultrasound on 05/18/14 with stable probably benign L breast calcifications and stable probably benign R breast masses BI-RADS category 3 with recommendation for repeat in 6 months.  #Fibromyalgia: At home she is taking gabapentin 100 mg tid which was continued.  #Depression: At home she is taking lexapro 10 mg daily and seroquel 200 mg qhs which were continued.  Discharge Vitals:   BP 119/73 mmHg  Pulse 78  Temp(Src) 98.7 F (37.1 C) (Oral)  Resp 18  Ht 5' 5" (1.651 m)  Wt 175 lb (79.379 kg)  BMI 29.12 kg/m2  SpO2 100%  LMP 09/14/2006  Discharge Physical Exam: Gen: A&O x 4, no acute distress, well developed, well nourished HEENT: Atraumatic, R eye injected, photophobic and tender, pupil constricted but reactive to light, pressures grossly symmetric, L eye normal, moist mucous membranes Heart: Regular rate and rhythm, normal S1 S2, no murmurs, rubs, or gallops Lungs: Clear to auscultation bilaterally, respirations unlabored Abd: Soft, non-tender, non-distended, + bowel sounds, no hepatosplenomegaly Ext: No edema or cyanosis Neuro: A&O x 4, CN II-XII intact, coordination intact on finger-nose-finger and heel-to-shin, strength 5/5 and symmetric in all extremities, sensation grossly intact, no Babinski  Discharge Labs:  Basic Metabolic Panel:  Recent Labs  07/20/14 1945  NA 138  K 3.7  CL 106  CO2 27  GLUCOSE 106*  BUN 13  CREATININE 0.87  CALCIUM 9.2  MG 2.1  PHOS 3.8   Liver Function Tests:  Recent Labs  07/20/14 1945  AST 24  ALT 27  ALKPHOS 75  BILITOT 0.3  PROT 7.2  ALBUMIN 4.2   CBC:  Recent Labs  07/20/14 1945  WBC 10.2  HGB 12.5  HCT 38.3  MCV 93.6  PLT 272   Fasting Lipid Panel:  Recent Labs  07/21/14 0755  CHOL 176  HDL 30*  LDLCALC 120*  TRIG 128  CHOLHDL 5.9   Thyroid Function  Tests:  Recent Labs  07/20/14 1945  TSH 1.348   Urine Drug Screen: Drugs of Abuse     Component Value Date/Time   LABOPIA NEG 11/15/2010 1414   COCAINSCRNUR NEG 11/15/2010 1414   LABBENZ NEG 11/15/2010 1414   AMPHETMU NEG 11/15/2010 1414    ESR 5  Signed: Kelby Aline, MD 07/21/2014, 5:15 PM    Services Ordered on Discharge: none Equipment Ordered on Discharge: none

## 2014-07-21 NOTE — Progress Notes (Signed)
Discharge instruction reviewed with patient. All questions answered ay this time. Patient is waiting for transportation from family.   Ave Filter, RN

## 2014-07-21 NOTE — Progress Notes (Signed)
Subjective: Ms Vaca says that her leg pain is improved as she has not been on her feet. She says she has not had any unusual neurologic symptoms such as dysarthria or facial droop. She was agreeable with [facility] considered out-patient MRI for suspected radiculopathy. Today she complains of acute onset R eye photophobia, injection, and pain. No vision changes. She also has a R sided headache above her eye. She was recently seen in ED for similar symptoms 06/2014 and seen by Dr Talbert Forest of [place] and [place] who diagnosed her with iridocyclitis OD and put her on durezol 0.05% eye drops 1 daily OD.  Objective: Vital signs in last 24 hours: Filed Vitals:   07/20/14 2330 07/21/14 0130 07/21/14 0330 07/21/14 1014  BP: 110/60 109/65 112/70 119/73  Pulse: 88 78 64 78  Temp: 97.3 F (36.3 C) 97.7 F (36.5 C) 97.3 F (36.3 C) 98.7 F (37.1 C)  TempSrc: Oral Oral Oral Oral  Resp: '16 18 16 18  ' Height:      Weight:      SpO2: 100% 100% 99% 100%   Weight change:   Intake/Output Summary (Last 24 hours) at 07/21/14 1350 Last data filed at 07/21/14 1300  Gross per 24 hour  Intake    600 ml  Output      0 ml  Net    600 ml   Gen: A&O x 4, no acute distress, well developed, well nourished HEENT: Atraumatic, R eye injected, photophobic and tender, pupil constricted but reactive to light, pressures grossly symmetric, L eye normal, moist mucous membranes Heart: Regular rate and rhythm, normal S1 S2, no murmurs, rubs, or gallops Lungs: Clear to auscultation bilaterally, respirations unlabored Abd: Soft, non-tender, non-distended, + bowel sounds, no hepatosplenomegaly Ext: No edema or cyanosis Neuro: A&O x 4, CN II-XII  intact, coordination intact on finger-nose-finger and heel-to-shin, strength 5/5 and symmetric in all extremities, sensation grossly intact, no Babinski  Lab Results: Basic Metabolic Panel:  Recent Labs Lab 07/20/14 1945  NA 138  K 3.7  CL 106  CO2 27    GLUCOSE 106*  BUN 13  CREATININE 0.87  CALCIUM 9.2  MG 2.1  PHOS 3.8   Liver Function Tests:  Recent Labs Lab 07/20/14 1945  AST 24  ALT 27  ALKPHOS 75  BILITOT 0.3  PROT 7.2  ALBUMIN 4.2   CBC:  Recent Labs Lab 07/20/14 1945  WBC 10.2  HGB 12.5  HCT 38.3  MCV 93.6  PLT 272   Fasting Lipid Panel:  Recent Labs Lab 07/21/14 0755  CHOL 176  HDL 30*  LDLCALC 120*  TRIG 128  CHOLHDL 5.9   Thyroid Function Tests:  Recent Labs Lab 07/20/14 1945  TSH 1.348   Micro Results: No results found for this or any previous visit (from the past 240 hour(s)). Studies/Results: Ct Head Wo Contrast  07/20/2014   CLINICAL DATA:  Bilateral arm and leg numbness. Stroke-like symptoms. Headache.  EXAM: CT HEAD WITHOUT CONTRAST  TECHNIQUE: Contiguous axial images were obtained from the base of the skull through the vertex without intravenous contrast.  COMPARISON:  05/07/2011  FINDINGS: No mass lesion. No midline shift. No acute hemorrhage or hematoma. No extra-axial fluid collections. No evidence of acute infarction. Brain parenchyma is normal. Osseous structures are normal.  IMPRESSION: Normal exam.   Electronically Signed   By: Lorriane Shire M.D.   On: 07/20/2014 21:12   Medications: I have reviewed the patient's current medications. Scheduled Meds: .  escitalopram  10 mg Oral Daily  . gabapentin  100 mg Oral TID  . heparin  5,000 Units Subcutaneous 3 times per day  . loratadine  10 mg Oral Daily  . pantoprazole  20 mg Oral Daily  . QUEtiapine  200 mg Oral QHS  . simvastatin  40 mg Oral QHS  . sodium chloride  3 mL Intravenous Q12H   Continuous Infusions:  PRN Meds:.senna-docusate Assessment/Plan: Principal Problem:   Stroke-like symptoms Active Problems:   Hyperlipidemia   Depression   GERD (gastroesophageal reflux disease)   #Radiculopathy: Ms Towers says her b/l LE pain is improved because she has been off her legs while resting in her hospital bed. She has no  further neurologic complaints and denied any dysarthria or facial droop. Neuro exam benign. Head CT negative.The chronicity of this numbness, paresthesia, pain suggests radiculopathy. Last spine MRI l spine 06/2013 w no significant disc protrusion or stenosis, mild facet hypertrophy. We explained this to her and she was agreeable with considering out-patient MRI.  -consider MRI c versus l spine out-patient -f/u hemoglobin A1c -d/c ASA 325 mg daily -PT/OT -neuro checks q2h -cardiac monitoring  #Iridocyclitis OD: Ms Kleinsasser's photophobia, injection, eye pain is consistent with her recently treated iridocyclitis. We requested copy of records from Dr Talbert Forest who noted she recently completed a durazol taper. I spoke to him on the phone and he said this is likely a rebound iritis from her taper being too quick. He was very confident that this is the issue and said his associate Dr Maylene Roes in his [place] office would see her this afternoon if she is cleared for discharge for other issues. We had obtained ESR and CRP for concern of temporal arteritis which is pending. -f/u w Dr Maylene Roes this afternoon -f/u ESR, CRP  #HL: Lipid profile this admission w cholesterol 176, LDL 120, HDL 30, triglyceride 128. At home the patient is taking simvastatin 40 mg qhs. -cont simvastatin 40 mg qhs  #R breast mass: b/l mammogram and R breast ultrasound on 05/18/14 with stable probably benign L breast calcifications and stable probably benign R breast masses BI-RADS category 3 with recommendation for repeat in 6 months. -repeat b/l diagnostic mammography and R breast u/s in 10/2014  #Fibromyalgia: At home she is taking gabapentin 100 mg tid. -cont gabapentin 100 mg tid  #Depression: At home she is taking lexapro 10 mg daily and seroquel 200 mg qhs -cont lexapro 10 mg daily and seroquel 200 mg qhs  #Diet: heart  #DVT PPx: heparin 5000 u [place] tid  #Code: full  Dispo: Disposition is deferred at this time, awaiting  improvement of current medical problems.  Anticipated discharge in approximately 1 day(s).   The patient does have a current PCP (Aldine Contes, MD) and does need an [facility] hospital follow-up appointment after discharge.  The patient does not have transportation limitations that hinder transportation to clinic appointments.  .Services Needed at time of discharge: Y = Yes, Blank = No PT:   OT:   RN:   Equipment:   Other:     LOS: 1 day   Kelby Aline, MD 07/21/2014, 1:50 PM

## 2014-07-21 NOTE — Evaluation (Signed)
Occupational Therapy Evaluation Patient Details Name: Theresa Kirk MRN: [id number] DOB: [birth year] Today's Date: 07/21/2014    History of Present Illness Pt is a 54 yo female admitted with LUE numbness and RLE numbness and weakness.  Pt also with facial droop and speech deficits.  CT -.  Work up in progress.     Clinical Impression   Pt admitted with the above diagnosis and has the deficits listed below. Pt seems to be close to baseline status with adls but does have a reoccurrence of a R eye issue that makes it difficulty for her to keep her eyes open during adls and mobility.  Feel pt would benefit from one more session to ensure she is at an independent level to go home.    Follow Up Recommendations  No OT follow up;Supervision/Assistance - 24 hour (S just for a few days.)    Equipment Recommendations  None recommended by OT    Recommendations for Other Services       Precautions / Restrictions Precautions Precautions: Fall Restrictions Weight Bearing Restrictions: No      Mobility Bed Mobility Overal bed mobility: Modified Independent             General bed mobility comments: Pt needs extra time but was I.  Transfers Overall transfer level: Needs assistance Equipment used: None Transfers: Sit to/from Stand Sit to Stand: Supervision         General transfer comment: S for safety due to reported R leg weakness.    Balance Overall balance assessment: Needs assistance Sitting-balance support: Feet supported;Bilateral upper extremity supported Sitting balance-Leahy Scale: Good     Standing balance support: Bilateral upper extremity supported;During functional activity Standing balance-Leahy Scale: Good Standing balance comment: Pt able to stand at sink and on her own in room for long periods of time.  Pt reports R leg feels weak when she is up walking.                            ADL Overall ADL's : Needs  assistance/impaired Eating/Feeding: Independent;Sitting   Grooming: Supervision/safety;Standing;Oral care;Wash/dry hands;Wash/dry face Grooming Details (indicate cue type and reason): S due to  reports of R leg weakness and new R eye problems. Upper Body Bathing: Set up;Sitting   Lower Body Bathing: Supervison/ safety;Sit to/from stand   Upper Body Dressing : Set up;Sitting   Lower Body Dressing: Supervision/safety;Sit to/from stand   Toilet Transfer: Supervision/safety;Ambulation;Comfort height toilet Toilet Transfer Details (indicate cue type and reason): pt walked to bathroom.  Most limited by not being able to open R eye w/o pain. Toileting- Clothing Manipulation and Hygiene: Supervision/safety;Sit to/from stand       Functional mobility during ADLs: Supervision/safety General ADL Comments: Pt does well with adls.  Pt reports R leg weakness but did not have difficulty with functional mobility in the room.  Pt most limited by red R eye and not being able to keep it open during session due to pain.     Vision Vision Assessment?: Vision impaired- to be further tested in functional context Additional Comments: Pt with very red R eye.  Pt states she had been to eye doctor for this a few weeks ago, got drops and it improved.  Pt woke up this morning to redness and pain in R eye again.  Nursing aware.   Perception Perception Perception Tested?: Yes Comments: Only difficulty with overshooting due to keeping R eye closed.   Praxis Praxis  Praxis tested?: Within functional limits    Pertinent Vitals/Pain Pain Assessment: 0-10 Pain Score: 2  Pain Location: states she always hurts all over. Pain Descriptors / Indicators: Aching Pain Intervention(s): Limited activity within patient's tolerance;Monitored during session;Repositioned     Hand Dominance Right   Extremity/Trunk Assessment Upper Extremity Assessment Upper Extremity Assessment: Overall WFL for tasks assessed   Lower  Extremity Assessment Lower Extremity Assessment: Defer to PT evaluation   Cervical / Trunk Assessment Cervical / Trunk Assessment: Normal   Communication Communication Communication: No difficulties   Cognition Arousal/Alertness: Awake/alert Behavior During Therapy: WFL for tasks assessed/performed Overall Cognitive Status: Within Functional Limits for tasks assessed                     General Comments       Exercises       Shoulder Instructions      Home Living Family/patient expects to be discharged to:: Private residence Living Arrangements: Alone Available Help at Discharge: Family;Available 24 hours/day;Other (Comment) (for a few days to a week) Type of Home: Apartment Home Access: Stairs to enter Entrance Stairs-Number of Steps: 3 Entrance Stairs-Rails: Can reach both;Left;Right Home Layout: Two level;Bed/bath upstairs Alternate Level Stairs-Number of Steps: 15 Alternate Level Stairs-Rails: Right Bathroom Shower/Tub: Tub/shower unit;Curtain Shower/tub characteristics: Architectural technologist: Standard     Home Equipment: None          Prior Functioning/Environment Level of Independence: Independent        Comments: pt does drive    OT Diagnosis: Disturbance of vision;Generalized weakness   OT Problem List: Impaired balance (sitting and/or standing);Impaired vision/perception;Pain   OT Treatment/Interventions: Self-care/ADL training    OT Goals(Current goals can be found in the care plan section) Acute Rehab OT Goals Patient Stated Goal: to be able to open my R eye. OT Goal Formulation: With patient Time For Goal Achievement: 07/28/14 Potential to Achieve Goals: Good ADL Goals Pt Will Transfer to Toilet: with modified independence;regular height toilet;ambulating Additional ADL Goal #1: Pt will Ily dress self. Additional ADL Goal #2: Pt will Ily bathe self.  OT Frequency: Min 2X/week   Barriers to D/C: Decreased caregiver support  pt  lives alone but did state her cousin can stay with her for a while.       Co-evaluation              End of Session Nurse Communication: Mobility status  Activity Tolerance: Patient tolerated treatment well Patient left: in chair;with call bell/phone within reach;with chair alarm set   Time: 4098-1191 OT Time Calculation (min): 19 min Charges:  OT General Charges $OT Visit: 1 Procedure OT Evaluation $Initial OT Evaluation Tier I: 1 Procedure G-Codes: OT G-codes **NOT FOR INPATIENT CLASS** Functional Assessment Tool Used: clinical judgement Functional Limitation: Self care Self Care Current Status (Y7829): At least 1 percent but less than 20 percent impaired, limited or restricted Self Care Goal Status (F6213): 0 percent impaired, limited or restricted  Glenford Peers 07/21/2014, 9:21 AM  [phone]

## 2014-07-21 NOTE — Progress Notes (Signed)
UR completed 

## 2014-07-21 NOTE — Progress Notes (Deleted)
UR completed 

## 2014-07-21 NOTE — Evaluation (Signed)
Physical Therapy Evaluation Patient Details Name: Theresa Kirk MRN: [id number] DOB: [birth year] Today's Date: 07/21/2014   History of Present Illness  Pt is a 54 yo female admitted with LUE numbness and RLE numbness and weakness.  Pt also with facial droop and speech deficits.  CT -.  Work up in progress.      Clinical Impression  Patient presents with functional limitations due to deficits listed in PT problem list (see below). Pt with generalized weakness/numbness RLE and mild balance deficits impacting mobility. Pt close to baseline however would benefit from higher level balance challenges next session to prepare pt for independence at home. Needs to be able to negotiate 1 flight of steps prior to d/c.    Follow Up Recommendations Outpatient PT;Supervision - Intermittent    Equipment Recommendations  None recommended by PT    Recommendations for Other Services       Precautions / Restrictions Precautions Precautions: Fall Restrictions Weight Bearing Restrictions: No      Mobility  Bed Mobility               General bed mobility comments: Received sitting in chair upon PT arrival.   Transfers Overall transfer level: Needs assistance Equipment used: None Transfers: Sit to/from Stand Sit to Stand: Supervision         General transfer comment: Supervision for safety due to reported RLE weakness.  Ambulation/Gait Ambulation/Gait assistance: Min guard Ambulation Distance (Feet): 200 Feet Assistive device: None Gait Pattern/deviations: Step-through pattern;Decreased stride length;Drifts right/left   Gait velocity interpretation: Below normal speed for age/gender General Gait Details: Pt with mildly unsteady gait - noticeable after reporting pain in right eye from light in hallway causing HA. No overt LOB.  Stairs Stairs: Yes Stairs assistance: Supervision Stair Management: Two rails;Alternating pattern;Forwards Number of Stairs: 2 (+ 3 steps x2  bouts) General stair comments: Supervision for safety. Cues for technique.  Wheelchair Mobility    Modified Rankin (Stroke Patients Only)       Balance Overall balance assessment: Needs assistance Sitting-balance support: Feet supported;No upper extremity supported Sitting balance-Leahy Scale: Good     Standing balance support: During functional activity Standing balance-Leahy Scale: Good                 High Level Balance Comments: Tolerated stepping over objects with some deviation in gait but pt able to prevent LOB; able to perform figure 8 around objects with dizziness, no overt LOB.      Dynamic Gait Index Gait and Pivot Turn: Mild Impairment Step Over Obstacle: Mild Impairment Step Around Obstacles: Normal Steps: Mild Impairment       Pertinent Vitals/Pain Pain Assessment: No/denies pain    Home Living Family/patient expects to be discharged to:: Private residence Living Arrangements: Alone Available Help at Discharge: Family;Available 24 hours/day;Other (Comment)   Home Access: Stairs to enter Entrance Stairs-Rails: Can reach both;Left;Right Entrance Stairs-Number of Steps: 3 Home Layout: Two level;Bed/bath upstairs Home Equipment: None      Prior Function Level of Independence: Independent         Comments: pt does drive     Hand Dominance   Dominant Hand: Right    Extremity/Trunk Assessment   Upper Extremity Assessment: Defer to OT evaluation;Overall [facility] for tasks assessed           Lower Extremity Assessment: Generalized weakness;RLE deficits/detail RLE Deficits / Details: Grossly ~4+/5 throughout.     Cervical / Trunk Assessment: Normal  Communication   Communication: No difficulties  Cognition  Arousal/Alertness: Awake/alert Behavior During Therapy: WFL for tasks assessed/performed Overall Cognitive Status: Within Functional Limits for tasks assessed                      General Comments General comments (skin  integrity, edema, etc.): Pt with impaired S1 myotome as noted during walking on toes. Reports sharp pain in back.     Exercises        Assessment/Plan    PT Assessment Patient needs continued PT services  PT Diagnosis Difficulty walking;Generalized weakness   PT Problem List Decreased strength;Impaired sensation;Decreased balance  PT Treatment Interventions Balance training;Gait training;Patient/family education;Stair training;Functional mobility training;Therapeutic activities;Therapeutic exercise;Neuromuscular re-education   PT Goals (Current goals can be found in the Care Plan section) Acute Rehab PT Goals Patient Stated Goal: to make this numbness and pain go away PT Goal Formulation: With patient Time For Goal Achievement: 08/04/14 Potential to Achieve Goals: Fair    Frequency Min 3X/week   Barriers to discharge        Co-evaluation               End of Session Equipment Utilized During Treatment: Gait belt Activity Tolerance: Patient tolerated treatment well Patient left: in chair;with call bell/phone within reach;with family/visitor present Nurse Communication: Mobility status    Functional Assessment Tool Used: Clinical judgment Functional Limitation: Mobility: Walking and moving around Mobility: Walking and Moving Around Current Status [phone]): At least 1 percent but less than 20 percent impaired, limited or restricted Mobility: Walking and Moving Around Goal Status [phone]): At least 1 percent but less than 20 percent impaired, limited or restricted    Time: 1040-1057 PT Time Calculation (min) (ACUTE ONLY): 17 min   Charges:   PT Evaluation $Initial PT Evaluation Tier I: 1 Procedure     PT G Codes:   PT G-Codes **NOT FOR INPATIENT CLASS** Functional Assessment Tool Used: Clinical judgment Functional Limitation: Mobility: Walking and moving around Mobility: Walking and Moving Around Current Status (M2111): At least 1 percent but less than 20 percent  impaired, limited or restricted Mobility: Walking and Moving Around Goal Status [phone]): At least 1 percent but less than 20 percent impaired, limited or restricted    Candy Sledge A 07/21/2014, 12:01 PM  Candy Sledge, [place], DPT [phone]

## 2014-07-22 LAB — HEMOGLOBIN A1C
HEMOGLOBIN A1C: 5.9 % — AB (ref 4.8–5.6)
Mean Plasma Glucose: 123 mg/dL

## 2014-07-22 NOTE — Progress Notes (Signed)
INTERNAL MEDICINE TEACHING ATTENDING ADDENDUM - Angie Piercey, MD: I reviewed and discussed at the time of visit with the resident Dr. Sadek, the patient's medical history, physical examination, diagnosis and results of pertinent tests and treatment and I agree with the patient's care as documented.  

## 2014-07-27 ENCOUNTER — Ambulatory Visit: Payer: Commercial Managed Care - HMO | Admitting: Internal Medicine

## 2014-08-08 ENCOUNTER — Encounter: Payer: Self-pay | Admitting: *Deleted

## 2014-08-10 ENCOUNTER — Other Ambulatory Visit: Payer: Self-pay | Admitting: Internal Medicine

## 2014-08-18 ENCOUNTER — Ambulatory Visit (HOSPITAL_COMMUNITY)
Admission: RE | Admit: 2014-08-18 | Discharge: 2014-08-18 | Disposition: A | Discharge status: Home / Self Care | Payer: Commercial Managed Care - HMO | Source: Ambulatory Visit | Attending: Internal Medicine | Admitting: Internal Medicine

## 2014-08-18 ENCOUNTER — Encounter: Payer: Self-pay | Admitting: Internal Medicine

## 2014-08-18 ENCOUNTER — Ambulatory Visit (INDEPENDENT_AMBULATORY_CARE_PROVIDER_SITE_OTHER): Payer: Commercial Managed Care - HMO | Admitting: Internal Medicine

## 2014-08-18 VITALS — BP 127/82 | HR 74 | Temp 98.5°F | Ht 65.0 in | Wt 178.0 lb

## 2014-08-18 DIAGNOSIS — M25562 Pain in left knee: Secondary | ICD-10-CM

## 2014-08-18 DIAGNOSIS — F329 Major depressive disorder, single episode, unspecified: Secondary | ICD-10-CM

## 2014-08-18 DIAGNOSIS — M25561 Pain in right knee: Secondary | ICD-10-CM

## 2014-08-18 DIAGNOSIS — R8299 Other abnormal findings in urine: Secondary | ICD-10-CM | POA: Diagnosis not present

## 2014-08-18 DIAGNOSIS — G8929 Other chronic pain: Secondary | ICD-10-CM | POA: Diagnosis not present

## 2014-08-18 DIAGNOSIS — M179 Osteoarthritis of knee, unspecified: Secondary | ICD-10-CM | POA: Diagnosis not present

## 2014-08-18 DIAGNOSIS — R82998 Other abnormal findings in urine: Secondary | ICD-10-CM

## 2014-08-18 DIAGNOSIS — F32A Depression, unspecified: Secondary | ICD-10-CM

## 2014-08-18 LAB — POCT URINALYSIS DIPSTICK
Bilirubin, UA: NEGATIVE
GLUCOSE UA: NEGATIVE
Ketones, UA: NEGATIVE
Leukocytes, UA: NEGATIVE
Nitrite, UA: NEGATIVE
PH UA: 5
Protein, UA: NEGATIVE
SPEC GRAV UA: 1.01
Urobilinogen, UA: 0.2

## 2014-08-18 MED ORDER — DICLOFENAC SODIUM 1 % TD GEL: 4.0000 g | Freq: Four times a day (QID) | TRANSDERMAL | Status: DC

## 2014-08-18 NOTE — Assessment & Plan Note (Signed)
>>  ASSESSMENT AND PLAN FOR BILATERAL KNEE PAIN WRITTEN ON 08/18/2014  2:17 PM BY NARENDRA, NISCHAL, MD  - Patient with persistent b/l knee pain but states it is slightly worsening now and she has difficulty mbulating - Pain is predominantly in the knees but also radiates to the thighs and calves - She denies joint swelling/redness, no fevers/chils, no recent trauma - X rays done today with mild L osteoarthritic changes and no change in x rays of the right knee - Will refer patient for PT and start her on voltaren  gel prn - She will follow up in 1 month - If no improvement will consider referral to sports medicine

## 2014-08-18 NOTE — Assessment & Plan Note (Addendum)
-   Patient with persistent b/l knee pain but states it is slightly worsening now and she has difficulty mbulating - Pain is predominantly in the knees but also radiates to the thighs and calves - She denies joint swelling/redness, no fevers/chils, no recent trauma - X rays done today with mild L osteoarthritic changes and no change in x rays of the right knee - Will refer patient for PT and start her on voltaren gel prn - She will follow up in 1 month - If no improvement will consider referral to sports medicine

## 2014-08-18 NOTE — Assessment & Plan Note (Signed)
-   Symptoms have improved now and she states she feels welll with current medications - Will c/w seroquel and lexapro

## 2014-08-18 NOTE — Patient Instructions (Signed)
- It was a pleasure seeing you again - I am prescribing voltaren gel for your knee pain. Please use as directed - I will also refer you to Physical therapy for your knee and back pain - I have ordered X rays for your knees - The urine test did not show signs of infection   Back Pain, Adult Low back pain is very common. About 1 in 5 people have back pain.The cause of low back pain is rarely dangerous. The pain often gets better over time.About half of people with a sudden onset of back pain feel better in just 2 weeks. About 8 in 10 people feel better by 6 weeks.  CAUSES Some common causes of back pain include:  Strain of the muscles or ligaments supporting the spine.  Wear and tear (degeneration) of the spinal discs.  Arthritis.  Direct injury to the back. DIAGNOSIS Most of the time, the direct cause of low back pain is not known.However, back pain can be treated effectively even when the exact cause of the pain is unknown.Answering your caregiver's questions about your overall health and symptoms is one of the most accurate ways to make sure the cause of your pain is not dangerous. If your caregiver needs more information, he or she may order lab work or imaging tests (X-rays or MRIs).However, even if imaging tests show changes in your back, this usually does not require surgery. HOME CARE INSTRUCTIONS For many people, back pain returns.Since low back pain is rarely dangerous, it is often a condition that people can learn to [facility] their own.   Remain active. It is stressful on the back to sit or stand in one place. Do not sit, drive, or stand in one place for more than 30 minutes at a time. Take short walks on level surfaces as soon as pain allows.Try to increase the length of time you walk each day.  Do not stay in bed.Resting more than 1 or 2 days can delay your recovery.  Do not avoid exercise or work.Your body is made to move.It is not dangerous to be active, even  though your back may hurt.Your back will likely heal faster if you return to being active before your pain is gone.  Pay attention to your body when you bend and lift. Many people have less discomfortwhen lifting if they bend their knees, keep the load close to their bodies,and avoid twisting. Often, the most comfortable positions are those that put less stress on your recovering back.  Find a comfortable position to sleep. Use a firm mattress and lie on your side with your knees slightly bent. If you lie on your back, put a pillow under your knees.  Only take over-the-counter or prescription medicines as directed by your caregiver. Over-the-counter medicines to reduce pain and inflammation are often the most helpful.Your caregiver may prescribe muscle relaxant drugs.These medicines help dull your pain so you can more quickly return to your normal activities and healthy exercise.  Put ice on the injured area.  Put ice in a plastic bag.  Place a towel between your skin and the bag.  Leave the ice on for 15-20 minutes, 03-04 times a day for the first 2 to 3 days. After that, ice and heat may be alternated to reduce pain and spasms.  Ask your caregiver about trying back exercises and gentle massage. This may be of some benefit.  Avoid feeling anxious or stressed.Stress increases muscle tension and can worsen back pain.It is important  to recognize when you are anxious or stressed and learn ways to manage it.Exercise is a great option. SEEK MEDICAL CARE IF:  You have pain that is not relieved with rest or medicine.  You have pain that does not improve in 1 week.  You have new symptoms.  You are generally not feeling well. SEEK IMMEDIATE MEDICAL CARE IF:   You have pain that radiates from your back into your legs.  You develop new bowel or bladder control problems.  You have unusual weakness or numbness in your arms or legs.  You develop nausea or vomiting.  You develop  abdominal pain.  You feel faint. Document Released: 05/20/2005 Document Revised: 11/19/2011 Document Reviewed: 09/21/2013 [facility] Patient Information 2015 [place], [place]. This information is not intended to replace advice given to you by your health care provider. Make sure you discuss any questions you have with your health care provider.

## 2014-08-18 NOTE — Progress Notes (Signed)
   Subjective:    Patient ID: Theresa Kirk, female    DOB: Jun 21, 1960, 54 y.o.   MRN: [id number]  HPI Patient is here for worsening bilateral knee pain R>L. She has also noted that her chronic back pain is persistent.   She also has dark urine over the last 2 days. No hematuria, no dysuria, no fevers/chills, no abd pain, no increased frequency   Please see problem based charting for details.   Review of Systems  Constitutional: Negative.   HENT: Negative.   Eyes: Negative.   Respiratory: Negative.   Cardiovascular: Negative.   Gastrointestinal: Negative.   Genitourinary:       Patient complains of darker urine over the last 2 days which smells a little worse than ususal  Musculoskeletal: Positive for back pain and arthralgias. Negative for myalgias, joint swelling, neck pain and neck stiffness.       B/l knee pain which is gradually worsening and back pain (chronic)  Neurological: Negative.   Psychiatric/Behavioral: Negative.        Objective:   Physical Exam  Constitutional: She is oriented to person, place, and time. She appears well-developed and well-nourished.  HENT:  Head: Normocephalic and atraumatic.  Eyes: Conjunctivae are normal.  Neck: Normal range of motion.  Cardiovascular: Normal rate, regular rhythm and normal heart sounds.   No murmur heard. Pulmonary/Chest: Effort normal and breath sounds normal. No respiratory distress. She has no wheezes.  Abdominal: Soft. Bowel sounds are normal. She exhibits no distension. There is no tenderness.  Musculoskeletal: Normal range of motion. She exhibits no edema or tenderness.  Patient with difficulty ambulating secondary to pain in her knees but normal ROM, no erythema or swelling around the knees  Neurological: She is alert and oriented to person, place, and time.  Skin: Skin is warm and dry.  Psychiatric: She has a normal mood and affect. Her behavior is normal.          Assessment & Plan:  Possible UTI: -  patient with dark and foul smelling urine over the last 2 days bit denied fevers, abd pain, dysuria or increased urinary frequency - U/A done with trace blood but no leukocytes or nitrites - No further w/u for now. Patient instructed to follow up if it persists or she noted blood in urine or decreased urination or fevers  Please see problem based charting for details of remaining problems

## 2014-08-30 ENCOUNTER — Ambulatory Visit: Payer: Commercial Managed Care - HMO | Attending: Internal Medicine | Admitting: Physical Therapy

## 2014-08-30 DIAGNOSIS — M25561 Pain in right knee: Secondary | ICD-10-CM | POA: Diagnosis not present

## 2014-08-30 DIAGNOSIS — M545 Low back pain: Secondary | ICD-10-CM

## 2014-08-30 DIAGNOSIS — R262 Difficulty in walking, not elsewhere classified: Secondary | ICD-10-CM

## 2014-08-30 DIAGNOSIS — M25562 Pain in left knee: Secondary | ICD-10-CM | POA: Diagnosis not present

## 2014-08-30 DIAGNOSIS — R293 Abnormal posture: Secondary | ICD-10-CM | POA: Diagnosis not present

## 2014-08-30 NOTE — Patient Instructions (Addendum)
Leg Extension (Hamstring)   Sit toward front edge of chair, with leg out straight, heel on floor, toes pointing toward body. Keeping back straight, bend forward at hip, breathing out through pursed lips. Return, breathing in. Repeat _2-3__ times. Repeat with other leg. Do __1-2_ sessions per day. Variation: Perform from standing position, with support.    Stretching: Hamstring (Sitting)   With right leg straight, tuck other foot near groin. Reach down until stretch is felt in back of thigh. Keep back straight. Hold __30_ seconds. Repeat __3__ times per set. Do __1__ sets per session. Do _2-3___ sessions per day. May have left leg off of bed as shown in clinic  http://orth.exer.us/661   Copyright  VHI. All rights reserved.  WALKING  Walking is a great form of exercise to increase your strength, endurance and overall fitness.  A walking program can help you start slowly and gradually build endurance as you go.  Everyone's ability is different, so each person's starting point will be different.  You do not have to follow them exactly.  The are just samples. You should simply find out what's right for you and stick to that program.   In the beginning, you'll start off walking 2-3 times a day for short distances.  As you get stronger, you'll be walking further at just 1-2 times per day.  A. You Can Walk For A Certain Length Of Time Each Day    Walk 5 minutes 3 times per day.  Increase 2 minutes every 2 days (3 times per day).  Work up to 25-30 minutes (1-2 times per day).   Example:   Day 1-2 5 minutes 3 times per day  This is a good place to start for you oK Ms. Poe 08-30-14   Day 7-8 12 minutes 2-3 times per day   Day 13-14 25 minutes 1-2 times per day  B. You Can Walk For a Certain Distance Each Day     Distance can be substituted for time.    Example:   3 trips to mailbox (at road)   3 trips to corner of block   3 trips around the block  C. Go to local [employer] and  use the track.    Walk for distance ____ around track  Or time ____ minutes  D. Walk ____ Jog ____ Run ___  Please only do the exercises that your therapist has initialed and dated  Voncille Lo, PT 08/30/2014 8:45 AM Phone: [phone] Fax: [phone]

## 2014-08-30 NOTE — Therapy (Signed)
[place], [place], [address] Phone: [phone]   Fax:  [phone]  Physical Therapy Evaluation  Patient Details  Name: Theresa Kirk MRN: [id number] Date of Birth: December 03, 1960 Referring Provider:  Aldine Contes, MD  Encounter Date: 08/30/2014      PT End of Session - 08/30/14 1142    Visit Number 1   Number of Visits 12   Date for PT Re-Evaluation 10/11/14   PT Start Time 0800   PT Stop Time 0846   PT Time Calculation (min) 46 min   Activity Tolerance Patient tolerated treatment well;Patient limited by pain   Behavior During Therapy [facility] for tasks assessed/performed      Past Medical History  Diagnosis Date  . Depression   . Fibromyalgia   . Carpal tunnel syndrome   . Alcohol abuse   . High cholesterol   . Arthritis     Past Surgical History  Procedure Laterality Date  . Abdominal hysterectomy  2008    h/o uterine fibroids, ?partial hysterectomy per pt 04/13/12  . Colonoscopy    . Knee surgery    . Foot surgery Right     There were no vitals filed for this visit.  Visit Diagnosis:  Knee pain, bilateral  Bilateral low back pain, with sciatica presence unspecified  Abnormal posture  Difficulty walking      Subjective Assessment - 08/30/14 0811    Symptoms bilateral knee pain / low back pain   Limitations Sitting;Walking;Standing;House hold activities   How long can you sit comfortably? 15 min   How long can you stand comfortably? 5 min   How long can you walk comfortably? pt can't answer question   Patient Stated Goals get rid of pain, walking for exercise, household chore, stair climbing   Currently in Pain? Yes   Pain Score 5    Pain Location Knee   Pain Orientation Right   Pain Descriptors / Indicators Aching  stiffness   Pain Type Chronic pain   Pain Onset More than a month ago   Pain Frequency Constant   Aggravating Factors  sitting a long time   Pain  Relieving Factors medication, hot bath   Multiple Pain Sites Yes   Pain Score 7   Pain Location Knee   Pain Orientation Left   Pain Descriptors / Indicators Aching;Sore   Pain Type Chronic pain   Pain Onset More than a month ago   Pain Frequency Intermittent   Pain Score 7   Pain Location Back   Pain Orientation Right;Left   Pain Descriptors / Indicators Aching;Sore   Pain Type Chronic pain   Pain Onset More than a month ago   Pain Frequency Intermittent   Aggravating Factors  washing dishes cleaning            [facility] PT Assessment - 08/30/14 0819    Assessment   Medical Diagnosis bilateral knee pain and back pain   Onset Date --  > 5 years ago for knees and back   Prior Therapy none   according to patient   Precautions   Precautions None   Restrictions   Weight Bearing Restrictions No   Balance Screen   Has the patient fallen in the past 6 months Yes  Friday March 25th fell over chair /knee gave out   How many times? 1   Has the patient had a decrease in activity level because of a fear of falling?  Yes  Is the patient reluctant to leave their home because of a fear of falling?  Yes   [place] Private residence   Living Arrangements Alone   Type of [place] to enter   Entrance Stairs-Number of Steps 3   Entrance Stairs-Rails Right   Home Layout Two level   Prior Function   Level of Independence Independent with basic ADLs;Independent with homemaking with ambulation;Independent with homemaking with wheelchair;Independent with gait;Independent with transfers   Vocation On disability   Cognition   Overall Cognitive Status Within Functional Limits for tasks assessed   Observation/Other Assessments   Observations Pt with complaints of every movement even with minimal stretch.    Pt when asked for specific knee pain referred global pain,    Focus on Therapeutic Outcomes (FOTO)  intake 38%, limitation 62%,  predicted 47%   Squat   Comments unable to squat greater than 60 degrees in hip bilaterallly   Single Leg Stance   Comments Right 6 sec, Left 5 sec pt states it hurts   Posture/Postural Control   Posture/Postural Control Postural limitations   Postural Limitations Rounded Shoulders;Forward head;Increased lumbar lordosis;Anterior pelvic tilt;Weight shift left   Posture Comments Pt hyperextends right   AROM   Right/Left Knee --  good patellar mobility bilaterally   Right Knee Extension 0   Right Knee Flexion 123  ERP   Left Knee Extension 4   Left Knee Flexion 125  ERP   Lumbar Flexion 70  ERP   Lumbar Extension 15  ERP   Lumbar - Right Side Bend 29  min ERP   Lumbar - Left Side Bend 25  min ERP   Lumbar - Right Rotation 60   Lumbar - Left Rotation 60   Strength   Overall Strength --  grossly 4/5 in LE  difficult to assess/not maintain resistan   Overall Strength Comments abdominal strength 3/5   Flexibility   Hamstrings 60 Right 65 left  pain more on ERP on right                   [facility] Adult PT Treatment/Exercise - 08/30/14 0819    Ambulation/Gait   Gait Comments discussed and gave home walking program    Knee/Hip Exercises: Stretches   Active Hamstring Stretch 2 reps   Active Hamstring Stretch Limitations started supine could not tolerate, sitting on bed with Right leg extended and Left off of bed as well as sitting in chair variation     Discussed importance of walking and exercising           PT Education - 08/30/14 1137    Education provided Yes   Education Details discussed walking program starting. explanation of findings and gave variations or hamstring stretch since pt did not tolerate supine.   Person(s) Educated Patient   Methods Explanation;Verbal cues;Demonstration;Handout;Tactile cues   Comprehension Verbalized understanding;Returned demonstration;Tactile cues required;Verbal cues required;Need further instruction              PT Long Term Goals - 08/30/14 1153    PT LONG TERM GOAL #1   Title Pt will be indepnednet wit advance HEP for core and LE strength   Time 6   Period Weeks   Status New   PT LONG TERM GOAL #2   Title Pt will be able to balance on one leg bilaterally at least 15 seconds for increased stablilty when walking   Time 6  Period Weeks   Status New   PT LONG TERM GOAL #3   Title Pt will report pain decrease to 2/10 or less with all functional acitivity   Time 6   Period Weeks   Status New   PT LONG TERM GOAL #4   Title FOTO will improve fron  62% limitation  to 47%    indicating imprved functional mobility   Time 6   Period Weeks   Status New   PT LONG TERM GOAL #5   Title Pt will demonstrate and verbalize techniques to reduce the risk of re-injury including lifting, posture, body mechanics   Time 6   Period Weeks   Status New               Plan - 08/30/14 2409    Clinical Impression Statement 54 yo complains of bilateral knee pain and back pain localized in low back only.  Pt with global complaints of pain and could not point to specific place on either knee when asked directly. Pt states she has difficulty with stairs climbing and cannot sit or stand for longer than 5 to 15 minutes.  Pt would benefit from Core/ LE strengthening  program.  Pt with tightend hamstrings whiich may contribute to back pain.  Pt is not active with any exercises and would benefit from skilled PT for HEP for core/LE strength and home walking program.  Pt with good patellar mobility and WFL limits on AROM except  Lumbar rotation which is 60 degrees bilaterally. Pt states she fell 4 days ago because knee " gave out". when tripping over chair.  Will monitor and evaluate for balance training at next visit if necessary. Pt will benefit from 2 x a week for 6 weeks to reinforce exercise and may use modalitties if needed.  Pt also would benefit from posture training and body mechaniecs and balance training.  Pt could  not tolerate longer than 7-8 seconds on Right or  left single limb stance.     Pt will benefit from skilled therapeutic intervention in order to improve on the following deficits Pain;Postural dysfunction;Improper body mechanics;Impaired flexibility;Decreased strength;Decreased range of motion;Decreased activity tolerance;Decreased mobility   Rehab Potential Good   PT Frequency 2x / week   PT Duration 6 weeks   PT Treatment/Interventions ADLs/Self Care Home Management;Cryotherapy;Ultrasound;Moist Heat;Electrical Stimulation;Dry needling;Manual techniques;Neuromuscular re-education;Balance training;Therapeutic exercise;Stair training;Gait training  iontophoresis   PT Next Visit Plan Pt may need balance assessment,  Continiue with SLR 4 way and progressive Core strengthening.  Ask about Home walking program         Problem List Patient Active Problem List   Diagnosis Date Noted  . Bilateral knee pain 08/18/2014  . Transient right leg weakness 07/20/2014  . Stroke-like symptoms 07/20/2014  . Bilateral ocular hypertension 06/27/2014  . Neck pain 05/05/2014  . GERD (gastroesophageal reflux disease) 05/05/2014  . Abnormal mammogram with microcalcification 10/15/2013  . Seasonal allergies 09/23/2012  . History of nipple discharge 05/05/2012  . Papilloma of breast, right 04/13/2012  . Healthcare maintenance 04/13/2012  . Right knee pain 03/26/2012  . Hyperlipidemia 11/06/2006  . Depression 11/06/2006  . FIBROMYALGIA 11/06/2006    Voncille Lo, PT 08/30/2014 12:03 PM Phone: [phone] Fax: [phone]  By signing I understand that I am ordering/authorizing the use of Iontophoresis using 4 mg/mL of dexamethasone as a component of this plan of care.  [place], [place], [address] Phone: [phone]   Fax:  [phone]     

## 2014-09-01 ENCOUNTER — Ambulatory Visit: Payer: Commercial Managed Care - HMO | Admitting: Physical Therapy

## 2014-09-01 DIAGNOSIS — M25561 Pain in right knee: Secondary | ICD-10-CM

## 2014-09-01 DIAGNOSIS — R262 Difficulty in walking, not elsewhere classified: Secondary | ICD-10-CM

## 2014-09-01 DIAGNOSIS — R293 Abnormal posture: Secondary | ICD-10-CM

## 2014-09-01 DIAGNOSIS — M545 Low back pain: Secondary | ICD-10-CM

## 2014-09-01 DIAGNOSIS — M25562 Pain in left knee: Secondary | ICD-10-CM

## 2014-09-01 NOTE — Therapy (Signed)
[place], [place], [address] Phone: [phone]   Fax:  [phone]  Physical Therapy Treatment  Patient Details  Name: Theresa Kirk MRN: [id number] Date of Birth: 25-Jan-1961 Referring Provider:  Aldine Contes, MD  Encounter Date: 09/01/2014      PT End of Session - 09/01/14 0749    Visit Number 2   Number of Visits 12   Date for PT Re-Evaluation 10/11/14   PT Start Time 0730      Past Medical History  Diagnosis Date  . Depression   . Fibromyalgia   . Carpal tunnel syndrome   . Alcohol abuse   . High cholesterol   . Arthritis     Past Surgical History  Procedure Laterality Date  . Abdominal hysterectomy  2008    h/o uterine fibroids, ?partial hysterectomy per pt 04/13/12  . Colonoscopy    . Knee surgery    . Foot surgery Right     There were no vitals filed for this visit.  Visit Diagnosis:  Difficulty walking  Knee pain, bilateral  Bilateral low back pain, with sciatica presence unspecified  Abnormal posture      Subjective Assessment - 09/01/14 0733    Symptoms Not hurting right now with prescription cream on my back and knees.                        [facility] Adult PT Treatment/Exercise - 09/01/14 0747    Lumbar Exercises: Stretches   Active Hamstring Stretch 3 reps;30 seconds   Lumbar Exercises: Aerobic   Stationary Bike Nustep L 3 UE/LE x 5 minutes   Lumbar Exercises: Supine   Ab Set 10 reps   Clam --   Bent Knee Raise 10 reps   Bent Knee Raise Limitations Scissors level 2   Other Supine Lumbar Exercises 4 way hip x 10 each  bilateral                 PT Education - 09/01/14 0746    Education provided Yes   Education Details 4 way SLR ; prepilates draw ins and march   Person(s) Educated Patient   Methods Explanation;Handout   Comprehension Verbalized understanding             PT Long Term Goals - 08/30/14 1153    PT LONG TERM  GOAL #1   Title Pt will be indepnednet wit advance HEP for core and LE strength   Time 6   Period Weeks   Status New   PT LONG TERM GOAL #2   Title Pt will be able to balance on one leg bilaterally at least 15 seconds for increased stablilty when walking   Time 6   Period Weeks   Status New   PT LONG TERM GOAL #3   Title Pt will report pain decrease to 2/10 or less with all functional acitivity   Time 6   Period Weeks   Status New   PT LONG TERM GOAL #4   Title FOTO will improve fron  62% limitation  to 47%    indicating imprved functional mobility   Time 6   Period Weeks   Status New   PT LONG TERM GOAL #5   Title Pt will demonstrate and verbalize techniques to reduce the risk of re-injury including lifting, posture, body mechanics   Time 6   Period Weeks   Status New  Plan - 09/01/14 0801    Clinical Impression Statement Established HEP for core and hip strength without c/o increased pain. Pt reports she walks around her apartment building for exercies and was already doing that on her own.    PT Next Visit Plan Pt may need balance assessment,  review  SLR 4 way and progressive Core strengthening.         Problem List Patient Active Problem List   Diagnosis Date Noted  . Bilateral knee pain 08/18/2014  . Transient right leg weakness 07/20/2014  . Stroke-like symptoms 07/20/2014  . Bilateral ocular hypertension 06/27/2014  . Neck pain 05/05/2014  . GERD (gastroesophageal reflux disease) 05/05/2014  . Abnormal mammogram with microcalcification 10/15/2013  . Seasonal allergies 09/23/2012  . History of nipple discharge 05/05/2012  . Papilloma of breast, right 04/13/2012  . Healthcare maintenance 04/13/2012  . Right knee pain 03/26/2012  . Hyperlipidemia 11/06/2006  . Depression 11/06/2006  . FIBROMYALGIA 11/06/2006    Dorene Ar, PTA 09/01/2014, 8:04 AM  [place], [place], [address] Phone: [phone]   Fax:  [phone]

## 2014-09-01 NOTE — Patient Instructions (Addendum)
Hip Extension (Prone)   Lift left leg _6___ inches from floor, keeping knee locked. Repeat __10__ times per set. Do ___2_ sets per session. Do _2___ sessions per day.  http://orth.exer.us/98   Copyright  VHI. All rights reserved.  Hip Adduction: Leg Lift (Eccentric) - Side-Lying   Lie on side with top leg bent, foot flat behind lower leg. Quickly lift lower leg. Slowly lower for 3-5 seconds. _10__ reps per set, _2__ sets per day, _7__ days per week. Add ___ lbs when you achieve ___ repetitions.  Copyright  VHI. All rights reserved.  Abduction: Side Leg Lift (Eccentric) - Side-Lying   Lie on side. Lift top leg slightly higher than shoulder level. Keep top leg straight with body, toes pointing forward. Slowly lower for 3-5 seconds.10 ___ reps per set, __2 sets per day, __7_ days per week. Add ___ lbs when you achieve ___ repetitions.  Copyright  VHI. All rights reserved.  Strengthening: Straight Leg Raise (Phase 1)   Tighten muscles on front of right thigh, then lift leg _12___ inches from surface, keeping knee locked.  Repeat _10___ times per set. Do _2___ sets per session. Do _2__ sessions per day.  http://orth.exer.us/614   Copyright  VHI. All rights reserved.    PELVIC TILT  Lie on back, legs bent. Exhale, tilting top of pelvis back, pubic bone up, to flatten lower back. Inhale, rolling pelvis opposite way, top forward, pubic bone down, arch in back. Repeat __10__ times. Do __2__ sessions per day. Copyright  VHI. All rights reserved.    Isometric Hold With Pelvic Floor (Hook-Lying)  Lie with hips and knees bent. Slowly inhale, and then exhale. Pull navel toward spine and tighten pelvic floor. Hold for __10_ seconds. Continue to breathe in and out during hold. Rest for _10__ seconds. Repeat __10_ times. Do __2-3_ times a day.   Knee Fold  Lie on back, legs bent, arms by sides. Exhale, lifting knee to chest. Inhale, returning. Keep abdominals flat, navel to  spine. Repeat __10__ times, alternating legs. Do __2__ sessions per day.  Knee Drop  Keep pelvis stable. Without rotating hips, slowly drop knee to side, pause, return to center, bring knee across midline toward opposite hip. Feel obliques engaging. Repeat for ___10_ times each leg.   Copyright  VHI. All rights reserved.       Heel Slide to Straight   Slide one leg down to straight. Return. Be sure pelvis does not rock forward, tilt, rotate, or tip to side. Do _10__ times. Restabilize pelvis. Repeat with other leg. Do __1-2_ sets, __2_ times per day.  http://ss.exer.us/16   Copyright  VHI. All rights reserved.

## 2014-09-09 ENCOUNTER — Other Ambulatory Visit: Payer: Self-pay | Admitting: Internal Medicine

## 2014-09-12 ENCOUNTER — Ambulatory Visit: Payer: Commercial Managed Care - HMO | Attending: Internal Medicine | Admitting: Physical Therapy

## 2014-09-12 DIAGNOSIS — M25562 Pain in left knee: Secondary | ICD-10-CM

## 2014-09-12 DIAGNOSIS — M25561 Pain in right knee: Secondary | ICD-10-CM

## 2014-09-12 DIAGNOSIS — R262 Difficulty in walking, not elsewhere classified: Secondary | ICD-10-CM

## 2014-09-12 DIAGNOSIS — M545 Low back pain: Secondary | ICD-10-CM | POA: Diagnosis not present

## 2014-09-12 DIAGNOSIS — R293 Abnormal posture: Secondary | ICD-10-CM | POA: Diagnosis not present

## 2014-09-12 NOTE — Therapy (Signed)
[place], [place], [address] Phone: [phone]   Fax:  [phone]  Physical Therapy Treatment  Patient Details  Name: Theresa Kirk MRN: [id number] Date of Birth: 1960/06/22 Referring Provider:  Aldine Contes, MD  Encounter Date: 09/12/2014      PT End of Session - 09/12/14 0739    Visit Number 3   Number of Visits 12   Date for PT Re-Evaluation 10/11/14   PT Start Time 0733   PT Stop Time 0800   PT Time Calculation (min) 27 min      Past Medical History  Diagnosis Date  . Depression   . Fibromyalgia   . Carpal tunnel syndrome   . Alcohol abuse   . High cholesterol   . Arthritis     Past Surgical History  Procedure Laterality Date  . Abdominal hysterectomy  2008    h/o uterine fibroids, ?partial hysterectomy per pt 04/13/12  . Colonoscopy    . Knee surgery    . Foot surgery Right     There were no vitals filed for this visit.  Visit Diagnosis:  Knee pain, bilateral  Bilateral low back pain, with sciatica presence unspecified  Difficulty walking  Abnormal posture      Subjective Assessment - 09/12/14 0742    Subjective I am just sore, no pain in back or knees                       [facility] Adult PT Treatment/Exercise - 09/12/14 0746    Standardized Balance Assessment   Standardized Balance Assessment Berg Balance Test   Berg Balance Test   Sit to Stand Able to stand without using hands and stabilize independently   Standing Unsupported Able to stand safely 2 minutes   Sitting with Back Unsupported but Feet Supported on Floor or Stool Able to sit safely and securely 2 minutes   Stand to Sit Sits safely with minimal use of hands   Transfers Able to transfer safely, minor use of hands   Standing Unsupported with Eyes Closed Able to stand 10 seconds safely   Standing Ubsupported with Feet Together Able to place feet together independently and stand 1 minute  safely   From Standing, Reach Forward with Outstretched Arm Can reach confidently >25 cm (10")   From Standing Position, Pick up Object from Floor Able to pick up shoe safely and easily   From Standing Position, Turn to Look Behind Over each Shoulder Looks behind from both sides and weight shifts well   Turn 360 Degrees Able to turn 360 degrees safely in 4 seconds or less   Standing Unsupported, Alternately Place Feet on Step/Stool Able to stand independently and safely and complete 8 steps in 20 seconds   Standing Unsupported, One Foot in Front Able to place foot tandem independently and hold 30 seconds   Standing on One Leg Able to lift leg independently and hold > 10 seconds   Total Score 56   Lumbar Exercises: Supine   Ab Set 10 reps   Bent Knee Raise 10 reps   Bent Knee Raise Limitations Scissors level 2 and 3   Other Supine Lumbar Exercises 4 way hip x 12 each  bilateral                 PT Education - 09/12/14 0750    Education provided Yes   Education Details SELF [place] HANDOUTS DISCUSSED  Person(s) Educated Patient   Methods Explanation;Handout   Comprehension Verbalized understanding             PT Long Term Goals - 09/12/14 0739    PT LONG TERM GOAL #1   Title Pt will be indepnednet wit advance HEP for core and LE strength   Time 6   Period Weeks   Status On-going   PT LONG TERM GOAL #2   Title Pt will be able to balance on one leg bilaterally at least 15 seconds for increased stablilty when walking   Time 6   Period Weeks   Status Achieved   PT LONG TERM GOAL #3   Title Pt will report pain decrease to 2/10 or less with all functional acitivity   Time 6   Period Weeks   Status On-going   PT LONG TERM GOAL #4   Title FOTO will improve fron  62% limitation  to 47%    indicating imprved functional mobility   Time 6   Period Weeks   Status On-going   PT LONG TERM GOAL #5   Title Pt will demonstrate and verbalize techniques  to reduce the risk of re-injury including lifting, posture, body mechanics   Time 6   Period Weeks   Status On-going               Plan - 09/12/14 0737    Clinical Impression Statement Pt reports decreased knee tightness and LBP only comes with performing house work. She also reports no longer having bilateral leg numbess since beginning P.T.SLS 25 second best. Independent with HEP. LTG #2 MET.    PT Next Visit Plan continue body mechanics, core, hip strength and balance        Problem List Patient Active Problem List   Diagnosis Date Noted  . Bilateral knee pain 08/18/2014  . Transient right leg weakness 07/20/2014  . Stroke-like symptoms 07/20/2014  . Bilateral ocular hypertension 06/27/2014  . Neck pain 05/05/2014  . GERD (gastroesophageal reflux disease) 05/05/2014  . Abnormal mammogram with microcalcification 10/15/2013  . Seasonal allergies 09/23/2012  . History of nipple discharge 05/05/2012  . Papilloma of breast, right 04/13/2012  . Healthcare maintenance 04/13/2012  . Right knee pain 03/26/2012  . Hyperlipidemia 11/06/2006  . Depression 11/06/2006  . FIBROMYALGIA 11/06/2006    Dorene Ar, PTA 09/12/2014, 8:05 AM  [place], [place], [address] Phone: [phone]   Fax:  [phone]

## 2014-09-12 NOTE — Patient Instructions (Signed)

## 2014-09-15 ENCOUNTER — Ambulatory Visit: Payer: Commercial Managed Care - HMO | Admitting: Physical Therapy

## 2014-09-15 DIAGNOSIS — M25562 Pain in left knee: Principal | ICD-10-CM

## 2014-09-15 DIAGNOSIS — M25561 Pain in right knee: Secondary | ICD-10-CM | POA: Diagnosis not present

## 2014-09-15 DIAGNOSIS — M545 Low back pain: Secondary | ICD-10-CM

## 2014-09-15 DIAGNOSIS — R293 Abnormal posture: Secondary | ICD-10-CM

## 2014-09-15 DIAGNOSIS — R262 Difficulty in walking, not elsewhere classified: Secondary | ICD-10-CM

## 2014-09-15 NOTE — Therapy (Addendum)
[place], [place], [address] Phone: [phone]   Fax:  [phone]  Physical Therapy Treatment/Discharge Note  Patient Details  Name: Theresa Kirk MRN: [id number] Date of Birth: [birth year] Referring Provider:  Aldine Contes, MD  Encounter Date: 09/15/2014      PT End of Session - 09/15/14 0735    Visit Number 4   Number of Visits 12   Date for PT Re-Evaluation 10/11/14   PT Start Time 0732   PT Stop Time 0800   PT Time Calculation (min) 28 min      Past Medical History  Diagnosis Date  . Depression   . Fibromyalgia   . Carpal tunnel syndrome   . Alcohol abuse   . High cholesterol   . Arthritis     Past Surgical History  Procedure Laterality Date  . Abdominal hysterectomy  2008    h/o uterine fibroids, ?partial hysterectomy per pt 04/13/12  . Colonoscopy    . Knee surgery    . Foot surgery Right     There were no vitals filed for this visit.  Visit Diagnosis:  Knee pain, bilateral  Abnormal posture  Bilateral low back pain, with sciatica presence unspecified  Difficulty walking      Subjective Assessment - 09/15/14 0732    Subjective The pt. reported that she had a sore/nagging pain in my back and knees.  "It just aches all over."  The pt. reported that her hands also are sore all the time and she doesn't know why.  She reported a 5/10 sore/achy/nagging pain in her knees and back upon arrival, and said that she wasn't as sore after exercise.       Currently in Pain? Yes   Pain Score 5    Pain Location Knee   Pain Orientation Right;Left   Pain Descriptors / Indicators Nagging;Aching;Sore   Pain Score 5   Pain Location Back   Pain Descriptors / Indicators Nagging;Aching;Sore                       [facility] Adult PT Treatment/Exercise - 09/15/14 0740    Lumbar Exercises: Standing   Wall Slides 10 reps;5 reps;2 seconds  cues for abdoninal bracing   Lumbar  Exercises: Supine   Bent Knee Raise 20 reps   Bent Knee Raise Limitations scissors level 3   Bridge 5 reps;Other (comment)  5 reps with clams; cues for abdominal bracing and control   Other Supine Lumbar Exercises 4 way hip x 15 each  bilateral    Knee/Hip Exercises: Standing   SLS 35 second best right; 25 second best left; 22 second best right on foam pad; 15 second best left on foam pad   Rebounder 12 toss best red ball SLS left then right; 9 toss best SLS left and right on foam pad                     PT Long Term Goals - 09/12/14 0739    PT LONG TERM GOAL #1   Title Pt will be indepnednet wit advance HEP for core and LE strength   Time 6   Period Weeks   Status On-going   PT LONG TERM GOAL #2   Title Pt will be able to balance on one leg bilaterally at least 15 seconds for increased stablilty when walking   Time 6   Period Weeks   Status Achieved   PT  LONG TERM GOAL #3   Title Pt will report pain decrease to 2/10 or less with all functional acitivity   Time 6   Period Weeks   Status On-going   PT LONG TERM GOAL #4   Title FOTO will improve fron  62% limitation  to 47%    indicating imprved functional mobility   Time 6   Period Weeks   Status On-going   PT LONG TERM GOAL #5   Title Pt will demonstrate and verbalize techniques to reduce the risk of re-injury including lifting, posture, body mechanics   Time 6   Period Weeks   Status On-going               Plan - 09/15/14 0805    Clinical Impression Statement Pt. reports achy pain in knee and back.  She was able to progress to the rebounder, SLS on a foam pad, bridges, and wall slides.  The pt. was able to maintain a  22 second SLS right on foam pad; 15 second SLS left on foam pad and a 9 toss best on the rebounder with a red ball on a foam pad. The pt. demonstrated understanding of the importance of abdominal bracing during exercises.     PT Next Visit Plan Continue core, hip strength and balance, work  on wall slides and bridges, bridges/wall slide HEP, check hip strength        Problem List Patient Active Problem List   Diagnosis Date Noted  . Bilateral knee pain 08/18/2014  . Transient right leg weakness 07/20/2014  . Stroke-like symptoms 07/20/2014  . Bilateral ocular hypertension 06/27/2014  . Neck pain 05/05/2014  . GERD (gastroesophageal reflux disease) 05/05/2014  . Abnormal mammogram with microcalcification 10/15/2013  . Seasonal allergies 09/23/2012  . History of nipple discharge 05/05/2012  . Papilloma of breast, right 04/13/2012  . Healthcare maintenance 04/13/2012  . Right knee pain 03/26/2012  . Hyperlipidemia 11/06/2006  . Depression 11/06/2006  . FIBROMYALGIA 11/06/2006    Shatisha Falter,[place], SPTA 09/15/2014, 8:14 AM  [facility] [address] [place], [place], [address] Phone: [phone]   Fax:  [phone]   PHYSICAL THERAPY DISCHARGE SUMMARY  Visits from Start of Care: 4  Current functional level related to goals / functional outcomes: Unknown   Remaining deficits: Unknown   Education / Equipment: Initial HEP Plan: Patient agrees to discharge.  Patient goals were partially met. Patient is being discharged due to not returning since the last visit.  ?????        Voncille Lo, PT 04/24/2015 4:15 PM Phone: [phone] Fax: [phone]

## 2014-09-19 ENCOUNTER — Other Ambulatory Visit: Payer: Self-pay | Admitting: *Deleted

## 2014-09-19 NOTE — Telephone Encounter (Signed)
Pt's insurance has asked her to change pharmacy, [facility] wants 90 day supplies

## 2014-09-20 ENCOUNTER — Ambulatory Visit: Payer: Commercial Managed Care - HMO | Admitting: Physical Therapy

## 2014-09-20 MED ORDER — SIMVASTATIN 40 MG PO TABS: 40.0000 mg | ORAL_TABLET | Freq: Every day | ORAL | Status: DC

## 2014-09-20 MED ORDER — SUMATRIPTAN SUCCINATE 25 MG PO TABS: 25.0000 mg | ORAL_TABLET | ORAL | Status: DC | PRN

## 2014-09-20 MED ORDER — PANTOPRAZOLE SODIUM 20 MG PO TBEC: 20.0000 mg | DELAYED_RELEASE_TABLET | Freq: Every day | ORAL | Status: DC

## 2014-09-22 ENCOUNTER — Ambulatory Visit: Payer: Commercial Managed Care - HMO | Admitting: Physical Therapy

## 2014-09-28 ENCOUNTER — Telehealth: Payer: Self-pay | Admitting: Internal Medicine

## 2014-09-28 NOTE — Telephone Encounter (Signed)
Call to patient to confirm appointment for 09/29/14 at 8:15 lmtcb

## 2014-09-29 ENCOUNTER — Other Ambulatory Visit: Payer: Self-pay | Admitting: Internal Medicine

## 2014-09-29 ENCOUNTER — Encounter: Payer: Self-pay | Admitting: Internal Medicine

## 2014-09-29 ENCOUNTER — Ambulatory Visit (INDEPENDENT_AMBULATORY_CARE_PROVIDER_SITE_OTHER): Payer: Commercial Managed Care - HMO | Admitting: Internal Medicine

## 2014-09-29 VITALS — BP 113/70 | HR 86 | Temp 98.4°F | Wt 179.9 lb

## 2014-09-29 DIAGNOSIS — R7309 Other abnormal glucose: Secondary | ICD-10-CM

## 2014-09-29 DIAGNOSIS — R7303 Prediabetes: Secondary | ICD-10-CM

## 2014-09-29 DIAGNOSIS — N644 Mastodynia: Secondary | ICD-10-CM

## 2014-09-29 DIAGNOSIS — M25562 Pain in left knee: Secondary | ICD-10-CM

## 2014-09-29 DIAGNOSIS — M25561 Pain in right knee: Secondary | ICD-10-CM

## 2014-09-29 DIAGNOSIS — E669 Obesity, unspecified: Secondary | ICD-10-CM | POA: Insufficient documentation

## 2014-09-29 DIAGNOSIS — N63 Unspecified lump in breast: Secondary | ICD-10-CM | POA: Diagnosis not present

## 2014-09-29 DIAGNOSIS — N6320 Unspecified lump in the left breast, unspecified quadrant: Secondary | ICD-10-CM | POA: Insufficient documentation

## 2014-09-29 MED ORDER — GLUCOSAMINE-CHONDROITIN 500-400 MG PO CAPS: ORAL_CAPSULE | ORAL | Status: DC

## 2014-09-29 NOTE — Progress Notes (Signed)
   Subjective:    Patient ID: Theresa Kirk, female    DOB: 06-30-1960, 54 y.o.   MRN: [id number]  HPI Pt here for routine follow up of her knee pain and also has noted left breast pain over the past month.  Please refer to problem based charting for details   Review of Systems  Constitutional: Negative.   HENT: Negative.   Eyes: Negative.   Respiratory: Negative.   Cardiovascular: Negative.   Gastrointestinal: Negative.   Endocrine: Negative.   Musculoskeletal: Positive for arthralgias. Negative for myalgias, back pain, joint swelling and gait problem.  Skin: Negative.   Neurological: Negative.   Hematological: Negative.   Psychiatric/Behavioral: Negative.        Objective:   Physical Exam  Constitutional: She is oriented to person, place, and time. She appears well-developed and well-nourished.  HENT:  Head: Normocephalic and atraumatic.  Eyes: Conjunctivae are normal.  Neck: Normal range of motion. Neck supple.  Cardiovascular: Normal rate, regular rhythm and normal heart sounds.   No murmur heard. Pulmonary/Chest: Effort normal and breath sounds normal. No respiratory distress. She has no wheezes.  Abdominal: Soft. Bowel sounds are normal. She exhibits no distension. There is no tenderness.  Genitourinary: No breast swelling or tenderness.  Patient noted to have a small nodule in the left lateral breast, non tender.  Musculoskeletal: Normal range of motion. She exhibits no edema or tenderness.  Neurological: She is alert and oriented to person, place, and time.  Skin: Skin is warm and dry.  Psychiatric: She has a normal mood and affect. Her behavior is normal.          Assessment & Plan:  Please see problem based charting for assessment and plan

## 2014-09-29 NOTE — Assessment & Plan Note (Signed)
-   Discussed with patient the importance of lifestyle modifications including diet, exercise and weight loss - Explained significance of elevated A1C and answered questions - Will f/u A1C in 3 -6 months - No medication warranted at this time

## 2014-09-29 NOTE — Patient Instructions (Signed)
- It was a pleasure seeing you today - I will refer you for a mammogram for your left breast pain - I will prescribe glucosamine/chondroitin for your knee pain as well - Please follow up in 6 months - Please try and exercise and watch your diet like we discussed  Knee Pain The knee is the complex joint between your thigh and your lower leg. It is made up of bones, tendons, ligaments, and cartilage. The bones that make up the knee are:  The femur in the thigh.  The tibia and fibula in the lower leg.  The patella or kneecap riding in the groove on the lower femur. CAUSES  Knee pain is a common complaint with many causes. A few of these causes are:  Injury, such as:  A ruptured ligament or tendon injury.  Torn cartilage.  Medical conditions, such as:  Gout  Arthritis  Infections  Overuse, over training, or overdoing a physical activity. Knee pain can be minor or severe. Knee pain can accompany debilitating injury. Minor knee problems often respond well to self-care measures or get well on their own. More serious injuries may need medical intervention or even surgery. SYMPTOMS The knee is complex. Symptoms of knee problems can vary widely. Some of the problems are:  Pain with movement and weight bearing.  Swelling and tenderness.  Buckling of the knee.  Inability to straighten or extend your knee.  Your knee locks and you cannot straighten it.  Warmth and redness with pain and fever.  Deformity or dislocation of the kneecap. DIAGNOSIS  Determining what is wrong may be very straight forward such as when there is an injury. It can also be challenging because of the complexity of the knee. Tests to make a diagnosis may include:  Your caregiver taking a history and doing a physical exam.  Routine X-rays can be used to rule out other problems. X-rays will not reveal a cartilage tear. Some injuries of the knee can be diagnosed by:  Arthroscopy a surgical technique by  which a small video camera is inserted through tiny incisions on the sides of the knee. This procedure is used to examine and repair internal knee joint problems. Tiny instruments can be used during arthroscopy to repair the torn knee cartilage (meniscus).  Arthrography is a radiology technique. A contrast liquid is directly injected into the knee joint. Internal structures of the knee joint then become visible on X-ray film.  An MRI scan is a non X-ray radiology procedure in which magnetic fields and a computer produce two- or three-dimensional images of the inside of the knee. Cartilage tears are often visible using an MRI scanner. MRI scans have largely replaced arthrography in diagnosing cartilage tears of the knee.  Blood work.  Examination of the fluid that helps to lubricate the knee joint (synovial fluid). This is done by taking a sample out using a needle and a syringe. TREATMENT The treatment of knee problems depends on the cause. Some of these treatments are:  Depending on the injury, proper casting, splinting, surgery, or physical therapy care will be needed.  Give yourself adequate recovery time. Do not overuse your joints. If you begin to get sore during workout routines, back off. Slow down or do fewer repetitions.  For repetitive activities such as cycling or running, maintain your strength and nutrition.  Alternate muscle groups. For example, if you are a weight lifter, work the upper body on one day and the lower body the next.  Either  tight or weak muscles do not give the proper support for your knee. Tight or weak muscles do not absorb the stress placed on the knee joint. Keep the muscles surrounding the knee strong.  Take care of mechanical problems.  If you have flat feet, orthotics or special shoes may help. See your caregiver if you need help.  Arch supports, sometimes with wedges on the inner or outer aspect of the heel, can help. These can shift pressure away from  the side of the knee most bothered by osteoarthritis.  A brace called an "unloader" brace also may be used to help ease the pressure on the most arthritic side of the knee.  If your caregiver has prescribed crutches, braces, wraps or ice, use as directed. The acronym for this is PRICE. This means protection, rest, ice, compression, and elevation.  Nonsteroidal anti-inflammatory drugs (NSAIDs), can help relieve pain. But if taken immediately after an injury, they may actually increase swelling. Take NSAIDs with food in your stomach. Stop them if you develop stomach problems. Do not take these if you have a history of ulcers, stomach pain, or bleeding from the bowel. Do not take without your caregiver's approval if you have problems with fluid retention, heart failure, or kidney problems.  For ongoing knee problems, physical therapy may be helpful.  Glucosamine and chondroitin are over-the-counter dietary supplements. Both may help relieve the pain of osteoarthritis in the knee. These medicines are different from the usual anti-inflammatory drugs. Glucosamine may decrease the rate of cartilage destruction.  Injections of a corticosteroid drug into your knee joint may help reduce the symptoms of an arthritis flare-up. They may provide pain relief that lasts a few months. You may have to wait a few months between injections. The injections do have a small increased risk of infection, water retention, and elevated blood sugar levels.  Hyaluronic acid injected into damaged joints may ease pain and provide lubrication. These injections may work by reducing inflammation. A series of shots may give relief for as long as 6 months.  Topical painkillers. Applying certain ointments to your skin may help relieve the pain and stiffness of osteoarthritis. Ask your pharmacist for suggestions. Many over the-counter products are approved for temporary relief of arthritis pain.  In some countries, doctors often  prescribe topical NSAIDs for relief of chronic conditions such as arthritis and tendinitis. A review of treatment with NSAID creams found that they worked as well as oral medications but without the serious side effects. PREVENTION  Maintain a healthy weight. Extra pounds put more strain on your joints.  Get strong, stay limber. Weak muscles are a common cause of knee injuries. Stretching is important. Include flexibility exercises in your workouts.  Be smart about exercise. If you have osteoarthritis, chronic knee pain or recurring injuries, you may need to change the way you exercise. This does not mean you have to stop being active. If your knees ache after jogging or playing basketball, consider switching to swimming, water aerobics, or other low-impact activities, at least for a few days a week. Sometimes limiting high-impact activities will provide relief.  Make sure your shoes fit well. Choose footwear that is right for your sport.  Protect your knees. Use the proper gear for knee-sensitive activities. Use kneepads when playing volleyball or laying carpet. Buckle your seat belt every time you drive. Most shattered kneecaps occur in car accidents.  Rest when you are tired. SEEK MEDICAL CARE IF:  You have knee pain that is continual  and does not seem to be getting better.  SEEK IMMEDIATE MEDICAL CARE IF:  Your knee joint feels hot to the touch and you have a high fever. MAKE SURE YOU:   Understand these instructions.  Will watch your condition.  Will get help right away if you are not doing well or get worse. Document Released: 03/17/2007 Document Revised: 08/12/2011 Document Reviewed: 03/17/2007 [facility] Patient Information 2015 [place], [place]. This information is not intended to replace advice given to you by your health care provider. Make sure you discuss any questions you have with your health care provider.

## 2014-09-29 NOTE — Assessment & Plan Note (Addendum)
-   Knee pain has improved over the last month - Pain is well controlled on voltaren gel - Started patient on glucosamine/chondroitin sulfate for likely OA - Will follow up in 3 months

## 2014-09-29 NOTE — Assessment & Plan Note (Signed)
>>  ASSESSMENT AND PLAN FOR BILATERAL KNEE PAIN WRITTEN ON 09/29/2014  2:06 PM BY NARENDRA, NISCHAL, MD  - Knee pain has improved over the last month - Pain is well controlled on voltaren  gel - Started patient on glucosamine/chondroitin sulfate for likely OA - Will follow up in 3 months

## 2014-09-29 NOTE — Assessment & Plan Note (Signed)
-   Patient complains of intermittent, sharp, non radiating left breast pain over the past month. No fevers/chills, no weight loss, no loss of appetite, no nipple discharge - On exam she is noted to have a small nodule in the left lateral breast but no tenderness to palpation - Her last mammogram had stable probably benign left breast calcifications and right breast masses - Patient with likely benign masses based on last mammogram in December but will need further w/u to ascertain this - Will send her for diagnostic mammogram with ultrasound to further evaluate nodule

## 2014-10-04 ENCOUNTER — Ambulatory Visit
Admission: RE | Admit: 2014-10-04 | Discharge: 2014-10-04 | Disposition: A | Discharge status: Home / Self Care | Payer: Commercial Managed Care - HMO | Source: Ambulatory Visit | Attending: Internal Medicine | Admitting: Internal Medicine

## 2014-10-04 DIAGNOSIS — N644 Mastodynia: Secondary | ICD-10-CM

## 2014-10-04 DIAGNOSIS — N6320 Unspecified lump in the left breast, unspecified quadrant: Secondary | ICD-10-CM

## 2014-10-13 ENCOUNTER — Other Ambulatory Visit: Payer: Self-pay | Admitting: *Deleted

## 2014-10-13 ENCOUNTER — Other Ambulatory Visit: Payer: Self-pay | Admitting: Internal Medicine

## 2014-11-08 ENCOUNTER — Other Ambulatory Visit: Payer: Self-pay | Admitting: Internal Medicine

## 2014-11-16 ENCOUNTER — Other Ambulatory Visit: Payer: Self-pay | Admitting: Internal Medicine

## 2014-12-08 ENCOUNTER — Other Ambulatory Visit: Payer: Self-pay | Admitting: Internal Medicine

## 2015-01-07 ENCOUNTER — Other Ambulatory Visit: Payer: Self-pay | Admitting: Internal Medicine

## 2015-02-06 ENCOUNTER — Other Ambulatory Visit: Payer: Self-pay | Admitting: Internal Medicine

## 2015-03-03 ENCOUNTER — Encounter: Payer: Self-pay | Admitting: Internal Medicine

## 2015-03-03 ENCOUNTER — Ambulatory Visit (INDEPENDENT_AMBULATORY_CARE_PROVIDER_SITE_OTHER): Payer: Medicare Other | Admitting: Internal Medicine

## 2015-03-03 DIAGNOSIS — Z0000 Encounter for general adult medical examination without abnormal findings: Secondary | ICD-10-CM

## 2015-03-03 DIAGNOSIS — Z23 Encounter for immunization: Secondary | ICD-10-CM | POA: Diagnosis not present

## 2015-03-03 DIAGNOSIS — M542 Cervicalgia: Secondary | ICD-10-CM

## 2015-03-03 MED ORDER — CYCLOBENZAPRINE HCL 7.5 MG PO TABS: 7.5000 mg | ORAL_TABLET | Freq: Three times a day (TID) | ORAL | Status: DC | PRN

## 2015-03-03 NOTE — Assessment & Plan Note (Signed)
Complaints of neck pain today. She has no radicular symptoms. Pain and tenderness more along the shoulder spines- right. Xray cervical spine- 05/2014-Diffuse degenerative change. Degenerative changes most prominent at C4-C5, C5-C6, and C6-C7. No acute abnormality .   Plan- Pain like due to muscle spasm, no focal deficits or symptoms indicating need for more imaging at this time.  - She has not tried any meds for pain, suggested OTC tylenol arthrits, she says OTC ibuprofen upsets her stomach - Also trial of flexeril- 7.5mg  TID, this is what her insurance would accept but needs prior approval. - Encourage cold/warm compresses.

## 2015-03-03 NOTE — Assessment & Plan Note (Signed)
Flu shot today 

## 2015-03-03 NOTE — Patient Instructions (Signed)
Please take the flexeril as needed, three times a day. Also continue with mild stretching exercises.   Also try cold or hot compresses.  Get over the counter tylenol arthritis for the pain.

## 2015-03-03 NOTE — Progress Notes (Signed)
Patient ID: Theresa Kirk, female   DOB: [birth year], 54 y.o.   MRN: [id number]   Subjective:   Patient ID: Theresa Kirk female   DOB: [birth year] 54 y.o.   MRN: [id number]  HPI: [name] is a 54 y.o. with PMH listed below, presented today with complaints of neck pain worsening over the past three weeks, but has been having back pain since last visit in April this year. Per chart this has been a chronic issue for patient, she has had neck xrays, she has also had MRI lumber spine. Neck xrays- Degenerative disease, and MRi spine- mild facet hypertrophy.  Pt denies fever, pain in her neck wakes her up at night, and pain is worse than it was before. She has not tried anything for the pain. She denies any trauma. She denies weakness of extremities, dizziness or numbness in her extremities.  Past Medical History  Diagnosis Date  . Depression   . Fibromyalgia   . Carpal tunnel syndrome   . Alcohol abuse   . High cholesterol   . Arthritis    Current Outpatient Prescriptions  Medication Sig Dispense Refill  . Cetirizine HCl 10 MG CAPS Take 1 capsule (10 mg total) by mouth daily. (Patient taking differently: Take 10 mg by mouth daily as needed (allergies). ) 30 capsule 6  . diclofenac sodium (VOLTAREN) 1 % GEL Apply 4 g topically 4 (four) times daily. 100 g 2  . escitalopram (LEXAPRO) 10 MG tablet Take 10 mg by mouth daily.    . Glucosamine-Chondroitin ([facility] GLUCOSAMINE-CHONDROITIN) 500-400 MG CAPS Take1 pill by mouth twice a day 90 each 0  . pantoprazole (PROTONIX) 20 MG tablet take 1 tablet by mouth once daily 30 tablet 3  . SEROQUEL XR 200 MG 24 hr tablet take 1 tablet by mouth at bedtime 30 tablet 3  . sertraline (ZOLOFT) 50 MG tablet take 1 tablet by mouth twice a day 60 tablet 3  . simvastatin (ZOCOR) 40 MG tablet take 1 tablet by mouth at bedtime 30 tablet 3  . SUMAtriptan (IMITREX) 25 MG tablet Take 1 tablet (25 mg total) by mouth every 2 (two) hours as needed for migraine. 20  tablet 2   No current facility-administered medications for this visit.   Family History  Problem Relation Age of Onset  . Diabetes Mother   . Hypertension Mother   . Stroke Mother   . Diabetes Sister   . Thyroid disease Sister   . Hypertension Sister   . Cancer Maternal Aunt     unknown   Social History   Social History  . Marital Status: Single    Spouse Name: N/A  . Number of Children: N/A  . Years of Education: N/A   Social History Main Topics  . Smoking status: Former Smoker    Quit date: 06/03/2000  . Smokeless tobacco: Never Used  . Alcohol Use: 0.0 oz/week    0 Standard drinks or equivalent per week     Comment: Sometimes.  . Drug Use: Yes    Special: Marijuana  . Sexual Activity: Not Asked   Other Topics Concern  . None   Social History Narrative   Lives with mother in [place].   Currently unemployed.   Used to work at [employer] in [place].   ETOH use: weekend, 1-2 beers from [employer]: used to drink everyday   Former smoker   Current THC use   H/o cocaine use: quit in 2001.   Review of Systems: CONSTITUTIONAL- No Fever,  weightloss, night sweat or change in appetite. SKIN- No Rash, colour changes or itching. HEAD- No Headache or dizziness. Mouth/throat- No Sorethroat, dentures, or bleeding gums. RESPIRATORY- No Cough or SOB. CARDIAC- No Palpitations, or chest pain. GI- No vomiting, diarrhoea, abd pain. URINARY- No Frequency, or dysuria. NEUROLOGIC- No Numbness, syncope, seizures or burning. [facility]- Denies depression or anxiety.  Objective:  Physical Exam: Filed Vitals:   03/03/15 1017  BP: 125/70  Pulse: 90  Temp: 97.9 F (36.6 C)  TempSrc: Oral  Height: 5\' 5"  (1.651 m)  Weight: 184 lb 4.8 oz (83.598 kg)  SpO2: 99%   GENERAL- alert, co-operative, appears as stated age, not in any distress. HEENT- Atraumatic, normocephalic, PERRL, EOMI, oral mucosa appears moist, neck supple, tenderness upper back, above right shoulder blades CARDIAC-  RRR, no murmurs, rubs or gallops. RESP- Moving equal volumes of air, and no wheezes or crackles. ABDOMEN- Soft, nontender,bowel sounds present. BACK- Normal curvature of the spine, tenderness along the paravertebral spine- thoracic area NEURO- No obvious Cr N abnormality, strenght upper and lower extremities- 5/5, Sensation intact- globally,  gait- Normal. EXTREMITIES- pulse 2+, symmetric, no pedal edema. SKIN- Warm, dry, No rash or lesion. PSYCH- Normal mood and affect, appropriate thought content and speech.  Assessment & Plan:   The patient's case and plan of care was discussed with attending physician, Dr. Beryle Beams  Please see problem based charting for assessment and plan.

## 2015-03-03 NOTE — Progress Notes (Signed)
Medicine attending: Medical history, presenting problems, physical findings, and medications, reviewed with Dr Emokpae and I concur with her evaluation and management plan. 

## 2015-03-08 ENCOUNTER — Other Ambulatory Visit: Payer: Self-pay | Admitting: Internal Medicine

## 2015-03-28 ENCOUNTER — Encounter: Payer: Self-pay | Admitting: Internal Medicine

## 2015-03-28 ENCOUNTER — Ambulatory Visit (INDEPENDENT_AMBULATORY_CARE_PROVIDER_SITE_OTHER): Payer: Medicare Other | Admitting: Internal Medicine

## 2015-03-28 ENCOUNTER — Ambulatory Visit (HOSPITAL_COMMUNITY)
Admission: RE | Admit: 2015-03-28 | Discharge: 2015-03-28 | Disposition: A | Discharge status: Home / Self Care | Payer: Medicare Other | Source: Ambulatory Visit | Attending: Internal Medicine | Admitting: Internal Medicine

## 2015-03-28 ENCOUNTER — Other Ambulatory Visit: Payer: Self-pay | Admitting: Internal Medicine

## 2015-03-28 VITALS — BP 149/91 | HR 63 | Temp 97.9°F | Wt 186.4 lb

## 2015-03-28 DIAGNOSIS — B079 Viral wart, unspecified: Secondary | ICD-10-CM | POA: Insufficient documentation

## 2015-03-28 DIAGNOSIS — R300 Dysuria: Secondary | ICD-10-CM | POA: Diagnosis not present

## 2015-03-28 DIAGNOSIS — M50321 Other cervical disc degeneration at C4-C5 level: Secondary | ICD-10-CM | POA: Insufficient documentation

## 2015-03-28 DIAGNOSIS — D2262 Melanocytic nevi of left upper limb, including shoulder: Secondary | ICD-10-CM

## 2015-03-28 DIAGNOSIS — D2271 Melanocytic nevi of right lower limb, including hip: Secondary | ICD-10-CM

## 2015-03-28 DIAGNOSIS — M542 Cervicalgia: Secondary | ICD-10-CM | POA: Insufficient documentation

## 2015-03-28 DIAGNOSIS — R7303 Prediabetes: Secondary | ICD-10-CM | POA: Diagnosis not present

## 2015-03-28 DIAGNOSIS — M50322 Other cervical disc degeneration at C5-C6 level: Secondary | ICD-10-CM | POA: Diagnosis not present

## 2015-03-28 DIAGNOSIS — D2261 Melanocytic nevi of right upper limb, including shoulder: Secondary | ICD-10-CM

## 2015-03-28 DIAGNOSIS — D229 Melanocytic nevi, unspecified: Secondary | ICD-10-CM

## 2015-03-28 DIAGNOSIS — Z0000 Encounter for general adult medical examination without abnormal findings: Secondary | ICD-10-CM

## 2015-03-28 DIAGNOSIS — Z1231 Encounter for screening mammogram for malignant neoplasm of breast: Secondary | ICD-10-CM

## 2015-03-28 LAB — GLUCOSE, CAPILLARY: Glucose-Capillary: 93 mg/dL (ref 65–99)

## 2015-03-28 MED ORDER — SIMVASTATIN 40 MG PO TABS: 40.0000 mg | ORAL_TABLET | Freq: Every day | ORAL | Status: DC

## 2015-03-28 MED ORDER — DICLOFENAC SODIUM 1 % TD GEL: 4.0000 g | Freq: Four times a day (QID) | TRANSDERMAL | Status: DC

## 2015-03-28 MED ORDER — PANTOPRAZOLE SODIUM 20 MG PO TBEC: 20.0000 mg | DELAYED_RELEASE_TABLET | Freq: Every day | ORAL | Status: DC

## 2015-03-28 NOTE — Assessment & Plan Note (Signed)
-   repeat mammogram set up for December - Will need PAP smear on next visit or to be set up with pap smear clinic

## 2015-03-28 NOTE — Assessment & Plan Note (Addendum)
-   Patient with persistent neck pain which she describes as spasms and assoc tingling/numbness in both hands - No radicular symptoms - Will repeat cervical X ray today - Will not refill flexeril at this time - continue with OTC tylenol and voltaren gel prn

## 2015-03-28 NOTE — Assessment & Plan Note (Addendum)
-   Will repeat A1C today - CBG today was 93 - Encouraged diet modification and exercise

## 2015-03-28 NOTE — Patient Instructions (Signed)
-   It was a pleasure seeing you today - We will refer you to dermatology for the spots on your palm - We will check a urine culture today to test for a urinary tract infection - We will check an A1C today to assess your borderline diabetes - I will also refer you for a mammogram

## 2015-03-28 NOTE — Assessment & Plan Note (Signed)
-   Patient complains of intermittent dysuria in the absence of hematuria or increased urinary frequency - She also complains of mild intermittent vaginal itching with no discharge - u/a with no signs of infection. Will check urine cx - If culture negative and symptoms persist may need vaginal exam for possible fungal infection or bacterial vaginosis

## 2015-03-28 NOTE — Progress Notes (Signed)
   Subjective:    Patient ID: Theresa Kirk, female    DOB: 11-26-1960, 54 y.o.   MRN: [id number]  HPI 54 y/o female presents for routine follow up of her pre diabetes and neck pain.  Patient states that she has been attempting to exercise and lose weight. No blurry vision, no polydipsia/polyuria.  She was seen last month by Dr. Denton Brick for neck pain and she was started on flexeril prn. She states that helps with the pain but now she has finished a course of it and neck pain has recurred. It is intermittent, spasm like, assoc with tingling/numbness in both hands. No focal weakness, no loss of sensation, no shooting pain down the arms. No fevers, no weight loss, no syncope.  She also complains of dysuria and mild occasional vaginal itching. No fevers, no increased urinary frequency, no hematuria   Review of Systems  Constitutional: Negative.   HENT: Negative.   Eyes: Negative.   Respiratory: Negative.   Cardiovascular: Negative.   Gastrointestinal: Negative.   Genitourinary: Positive for dysuria. Negative for frequency, flank pain, vaginal discharge and vaginal pain.  Musculoskeletal: Positive for neck pain. Negative for back pain, joint swelling, arthralgias and gait problem.  Skin: Negative.   Neurological: Negative.   Psychiatric/Behavioral: Negative.        Objective:   Physical Exam  Constitutional: She is oriented to person, place, and time. She appears well-developed and well-nourished.  HENT:  Head: Normocephalic and atraumatic.  Eyes: Conjunctivae are normal.  Neck: Normal range of motion.  No tenderness over cervical spine  Cardiovascular: Normal rate and regular rhythm.   Pulmonary/Chest: Effort normal and breath sounds normal. No respiratory distress. She has no wheezes.  Abdominal: Soft. Bowel sounds are normal. She exhibits no distension. There is no tenderness.  Musculoskeletal: Normal range of motion. She exhibits no edema.  Neurological: She is alert and  oriented to person, place, and time.  Negative Tinels/phalen's sign   Skin: Skin is warm. No erythema.  Psychiatric: She has a normal mood and affect. Her behavior is normal.          Assessment & Plan:  Please see problem based charting for assessment and plan:

## 2015-03-28 NOTE — Assessment & Plan Note (Signed)
-   Patient with multiple moles noted on her palms bilaterally and her right leg - She states she has had most of them for 2 years but has noted they are now growing in size and some new ones have appeared - including over her right leg and left palm - Will refer to derm for possible biopsy of suspicious moles

## 2015-03-30 LAB — URINE CULTURE

## 2015-04-07 ENCOUNTER — Other Ambulatory Visit: Payer: Self-pay | Admitting: Internal Medicine

## 2015-04-18 DIAGNOSIS — Z79899 Other long term (current) drug therapy: Secondary | ICD-10-CM | POA: Diagnosis not present

## 2015-04-18 DIAGNOSIS — Z5181 Encounter for therapeutic drug level monitoring: Secondary | ICD-10-CM | POA: Diagnosis not present

## 2015-05-07 ENCOUNTER — Other Ambulatory Visit: Payer: Self-pay | Admitting: Internal Medicine

## 2015-05-22 ENCOUNTER — Ambulatory Visit
Admission: RE | Admit: 2015-05-22 | Discharge: 2015-05-22 | Disposition: A | Discharge status: Home / Self Care | Payer: Medicare Other | Source: Ambulatory Visit | Attending: Internal Medicine | Admitting: Internal Medicine

## 2015-05-22 DIAGNOSIS — Z1231 Encounter for screening mammogram for malignant neoplasm of breast: Secondary | ICD-10-CM

## 2015-06-06 ENCOUNTER — Other Ambulatory Visit: Payer: Self-pay | Admitting: Internal Medicine

## 2015-07-06 ENCOUNTER — Other Ambulatory Visit: Payer: Self-pay | Admitting: Internal Medicine

## 2015-07-25 DIAGNOSIS — Z79899 Other long term (current) drug therapy: Secondary | ICD-10-CM | POA: Diagnosis not present

## 2015-07-25 DIAGNOSIS — Z5181 Encounter for therapeutic drug level monitoring: Secondary | ICD-10-CM | POA: Diagnosis not present

## 2015-08-05 ENCOUNTER — Other Ambulatory Visit: Payer: Self-pay | Admitting: Internal Medicine

## 2015-09-04 ENCOUNTER — Other Ambulatory Visit: Payer: Self-pay | Admitting: Internal Medicine

## 2015-09-05 DIAGNOSIS — Z5181 Encounter for therapeutic drug level monitoring: Secondary | ICD-10-CM | POA: Diagnosis not present

## 2015-09-05 DIAGNOSIS — Z79899 Other long term (current) drug therapy: Secondary | ICD-10-CM | POA: Diagnosis not present

## 2015-09-05 NOTE — Telephone Encounter (Signed)
Spoke with pharmacy to confirm receipt of scripts written 3/3, 1st person did not have, 2nd person located them.  Sertraline removed from med profile on their end.  Thanks

## 2015-10-03 ENCOUNTER — Encounter: Payer: Self-pay | Admitting: Internal Medicine

## 2015-10-03 ENCOUNTER — Ambulatory Visit (INDEPENDENT_AMBULATORY_CARE_PROVIDER_SITE_OTHER): Payer: Medicare Other | Admitting: Internal Medicine

## 2015-10-03 VITALS — BP 130/76 | HR 64 | Temp 98.0°F | Ht 65.0 in | Wt 188.9 lb

## 2015-10-03 DIAGNOSIS — F329 Major depressive disorder, single episode, unspecified: Secondary | ICD-10-CM

## 2015-10-03 DIAGNOSIS — D2261 Melanocytic nevi of right upper limb, including shoulder: Secondary | ICD-10-CM

## 2015-10-03 DIAGNOSIS — E785 Hyperlipidemia, unspecified: Secondary | ICD-10-CM | POA: Diagnosis not present

## 2015-10-03 DIAGNOSIS — R7303 Prediabetes: Secondary | ICD-10-CM

## 2015-10-03 DIAGNOSIS — K219 Gastro-esophageal reflux disease without esophagitis: Secondary | ICD-10-CM

## 2015-10-03 DIAGNOSIS — R21 Rash and other nonspecific skin eruption: Secondary | ICD-10-CM

## 2015-10-03 DIAGNOSIS — Z0000 Encounter for general adult medical examination without abnormal findings: Secondary | ICD-10-CM

## 2015-10-03 DIAGNOSIS — J302 Other seasonal allergic rhinitis: Secondary | ICD-10-CM

## 2015-10-03 DIAGNOSIS — F32A Depression, unspecified: Secondary | ICD-10-CM

## 2015-10-03 DIAGNOSIS — D2262 Melanocytic nevi of left upper limb, including shoulder: Secondary | ICD-10-CM

## 2015-10-03 DIAGNOSIS — D229 Melanocytic nevi, unspecified: Secondary | ICD-10-CM

## 2015-10-03 MED ORDER — SIMVASTATIN 40 MG PO TABS: 40.0000 mg | ORAL_TABLET | Freq: Every day | ORAL | Status: DC

## 2015-10-03 MED ORDER — PANTOPRAZOLE SODIUM 20 MG PO TBEC: 20.0000 mg | DELAYED_RELEASE_TABLET | Freq: Every day | ORAL | Status: DC

## 2015-10-03 MED ORDER — DICLOFENAC SODIUM 1 % TD GEL: 4.0000 g | Freq: Four times a day (QID) | TRANSDERMAL | Status: DC

## 2015-10-03 MED ORDER — CETIRIZINE HCL 10 MG PO CAPS: 10.0000 mg | ORAL_CAPSULE | Freq: Every day | ORAL | Status: DC | PRN

## 2015-10-03 NOTE — Assessment & Plan Note (Signed)
-   Patient ran out of her medication - Complains of reflux symptoms while off her meds - Will refill omeprazole today

## 2015-10-03 NOTE — Assessment & Plan Note (Signed)
-   Was supposed to check A1C today but forgot to stop by the lab - No polydipsia/polyuria or polyphagia - Will check on follow up visit

## 2015-10-03 NOTE — Assessment & Plan Note (Signed)
-   Patient is now following with an outpatient therapist - States her symptoms are well controlled - Will not prescribe meds for depression as she is receiving them form another provider - She is now on seroquel 150 mg, bupropion 75 mg, clonidine 0.2 mg and lexapro 20 mg

## 2015-10-03 NOTE — Assessment & Plan Note (Signed)
-   Had recent mammogram with no evidence of malignancy - Will check PAP smear on next visit

## 2015-10-03 NOTE — Assessment & Plan Note (Signed)
-   States her allergies have been under control so far but would like refill of her cetirizine - No sneezing/rhinorrhea/coughat this time

## 2015-10-03 NOTE — Progress Notes (Signed)
   Subjective:    Patient ID: Theresa Kirk, female    DOB: 1960/07/21, 55 y.o.   MRN: [id number]  HPI Patient seen and examined. Patient is a 55 y/o female here for routine follow up of her pre diabetes, OA and the moles on her hand. Patient states her neck pain has improved but she still has persistent b/l knee pain which makes it difficult to ambulate. No fevers/chills, no joint swelling. She also complains of multiple moles on her hands which she went to derm for. States she was prescribed a cream which helped. She would like to follow up with them. She also noted a rash on her left upper arm, papular, no erythema, no bleeding and intermittently pruritic. She follows with an outpatient therapist for her depression and states it is better on her current medications.    Review of Systems  Constitutional: Negative.   HENT: Negative.   Eyes: Negative.   Respiratory: Negative.   Cardiovascular: Negative.   Gastrointestinal: Negative.   Musculoskeletal: Positive for arthralgias, gait problem and neck pain. Negative for back pain and joint swelling.  Skin: Positive for rash.  Neurological: Negative.   Psychiatric/Behavioral: Negative.        Objective:   Physical Exam  Constitutional: She is oriented to person, place, and time. She appears well-developed and well-nourished.  HENT:  Head: Normocephalic and atraumatic.  Neck: Normal range of motion.  Cardiovascular: Normal rate, regular rhythm and normal heart sounds.   No murmur heard. Pulmonary/Chest: Effort normal and breath sounds normal. No respiratory distress. She has no wheezes. She has no rales.  Abdominal: Soft. Bowel sounds are normal. She exhibits no distension. There is no tenderness.  Musculoskeletal: Normal range of motion. She exhibits no edema or tenderness.  Neurological: She is alert and oriented to person, place, and time.  Skin: Skin is warm. Rash noted.  Papular flesh colored rash over upper left arm, no  excoriations, no discharge  Psychiatric: She has a normal mood and affect. Her behavior is normal.          Assessment & Plan:  Please see problem based charting for assessment and plan:

## 2015-10-03 NOTE — Assessment & Plan Note (Signed)
-   Patient was to get lipid panel today but forgot to follow up with the lab - Will recheck lipid panel on next visit - Refilled zocor

## 2015-10-03 NOTE — Assessment & Plan Note (Signed)
-   Patient to be referred back to derm as the moles/calluses on her palm bilaterally have remained - She also complains of a rash on her upper left arm - possible eczema. Patient to follow up with derm - No bleeding/change in color or size of moles

## 2015-10-03 NOTE — Patient Instructions (Signed)
-   It was a pleasure seeing you today - Please continue to follow with your therapist for your depression and take medications as prescribed - We will refer you to dermatology for the moles on your hand - I have refilled your voltaren gel - use it as needed for the pain in your knees - Please follow up in 3 months for your PAP smear

## 2015-10-18 ENCOUNTER — Telehealth: Payer: Self-pay | Admitting: *Deleted

## 2015-10-18 NOTE — Telephone Encounter (Signed)
Received PA request for Diclofenac sodium 1% gel-request submitted online via cover my meds-request approved.Despina Hidden Cassady5/17/201710:38 AM

## 2015-12-20 DIAGNOSIS — D2371 Other benign neoplasm of skin of right lower limb, including hip: Secondary | ICD-10-CM | POA: Diagnosis not present

## 2015-12-20 DIAGNOSIS — B078 Other viral warts: Secondary | ICD-10-CM | POA: Diagnosis not present

## 2016-01-09 DIAGNOSIS — Z5181 Encounter for therapeutic drug level monitoring: Secondary | ICD-10-CM | POA: Diagnosis not present

## 2016-01-09 DIAGNOSIS — Z79899 Other long term (current) drug therapy: Secondary | ICD-10-CM | POA: Diagnosis not present

## 2016-01-12 NOTE — Addendum Note (Signed)
Addended by: Truddie Crumble on: 01/12/2016 02:34 PM   Modules accepted: Orders

## 2016-02-19 ENCOUNTER — Telehealth: Payer: Self-pay | Admitting: Internal Medicine

## 2016-02-19 NOTE — Telephone Encounter (Signed)
APT. REMINDER CALL, LMTCB °

## 2016-02-20 ENCOUNTER — Ambulatory Visit (HOSPITAL_COMMUNITY)
Admission: RE | Admit: 2016-02-20 | Discharge: 2016-02-20 | Disposition: A | Discharge status: Home / Self Care | Payer: Medicare Other | Source: Ambulatory Visit | Attending: Internal Medicine | Admitting: Internal Medicine

## 2016-02-20 ENCOUNTER — Ambulatory Visit (INDEPENDENT_AMBULATORY_CARE_PROVIDER_SITE_OTHER): Payer: Medicare Other | Admitting: Internal Medicine

## 2016-02-20 ENCOUNTER — Encounter: Payer: Self-pay | Admitting: Internal Medicine

## 2016-02-20 VITALS — BP 119/62 | HR 89 | Temp 98.2°F | Ht 65.0 in | Wt 191.7 lb

## 2016-02-20 DIAGNOSIS — R7303 Prediabetes: Secondary | ICD-10-CM | POA: Diagnosis not present

## 2016-02-20 DIAGNOSIS — R202 Paresthesia of skin: Secondary | ICD-10-CM

## 2016-02-20 DIAGNOSIS — Z87891 Personal history of nicotine dependence: Secondary | ICD-10-CM

## 2016-02-20 DIAGNOSIS — J069 Acute upper respiratory infection, unspecified: Secondary | ICD-10-CM

## 2016-02-20 DIAGNOSIS — F32A Depression, unspecified: Secondary | ICD-10-CM

## 2016-02-20 DIAGNOSIS — E785 Hyperlipidemia, unspecified: Secondary | ICD-10-CM | POA: Diagnosis not present

## 2016-02-20 DIAGNOSIS — M47812 Spondylosis without myelopathy or radiculopathy, cervical region: Secondary | ICD-10-CM | POA: Insufficient documentation

## 2016-02-20 DIAGNOSIS — B079 Viral wart, unspecified: Secondary | ICD-10-CM

## 2016-02-20 DIAGNOSIS — F329 Major depressive disorder, single episode, unspecified: Secondary | ICD-10-CM

## 2016-02-20 DIAGNOSIS — Z0000 Encounter for general adult medical examination without abnormal findings: Secondary | ICD-10-CM

## 2016-02-20 LAB — POCT GLYCOSYLATED HEMOGLOBIN (HGB A1C): HEMOGLOBIN A1C: 6

## 2016-02-20 LAB — GLUCOSE, CAPILLARY: GLUCOSE-CAPILLARY: 93 mg/dL (ref 65–99)

## 2016-02-20 NOTE — Progress Notes (Signed)
   Subjective:    Patient ID: Theresa Kirk, female    DOB: [birth year], 55 y.o.   MRN: [id number]  HPI  Patient seen and examined. She is here for routine follow up of prediabetes and hyperlipidemia She is compliant with her meds. She does note that she has intermittent tingling and numbness of both her hands and her feet. She states that her legs get numb when she sits too long and then resolve and she notes that her arms get numb frequently when she is sleeping on her side.  She did follow up with derm for palmar warts and was sent home with an ointment (? Salicylic acid) which had improved her symptoms but she stopped taking it as her skin started peeling.  She continues to follow with psych for her depression and states this is well controlled She [place] complains of cough and nasal congestion beginning 2 days ago for which she started taking theraflu which has helped. No fevers, no myalgias.  Review of Systems  Constitutional: Negative.   HENT: Positive for congestion.   Eyes: Negative.   Respiratory: Positive for cough. Negative for chest tightness, shortness of breath and wheezing.   Cardiovascular: Negative.   Gastrointestinal: Negative.   Musculoskeletal: Negative for arthralgias, gait problem, joint swelling and myalgias.  Skin:       Palmar warts  Neurological: Positive for numbness. Negative for dizziness, syncope, weakness and headaches.  Psychiatric/Behavioral: Negative.        Objective:   Physical Exam  Constitutional: She is oriented to person, place, and time. She appears well-developed and well-nourished.  HENT:  Head: Normocephalic and atraumatic.  Mouth/Throat: No oropharyngeal exudate.  Eyes: Right eye exhibits no discharge. Left eye exhibits no discharge.  Neck: Normal range of motion.  Cardiovascular: Normal rate, regular rhythm and normal heart sounds.   Pulmonary/Chest: Effort normal and breath sounds normal. No respiratory distress. She has no wheezes.    Abdominal: Soft. Bowel sounds are normal. She exhibits no distension. There is no tenderness.  Musculoskeletal: Normal range of motion. She exhibits no edema or tenderness.  Lymphadenopathy:    She has no cervical adenopathy.  Neurological: She is alert and oriented to person, place, and time.  Skin: Skin is warm. No erythema.  Palmar warts + b/l palms but some lesions have resolved since last visit  Psychiatric: She has a normal mood and affect. Her behavior is normal.          Assessment & Plan:  Please see problem based charting for assessment and plan:

## 2016-02-20 NOTE — Assessment & Plan Note (Signed)
-   Patient will return in 2 weeks for flu shot as she does not want to take it while having a cold

## 2016-02-20 NOTE — Assessment & Plan Note (Signed)
-   Patient with b/l multiple lesions on her palms which was diagnosed as warts by derm - She was treated with an ointment (likely salicylic acid) which was working but stopped as her skin started peeling - She will f/u with derm for possible cryotherapy

## 2016-02-20 NOTE — Assessment & Plan Note (Signed)
-   Patient with nasal congestion and cough productive of yellowish phlegm with no fevers/myalgias - She started taking Theraflu at home with some relief - Advised conservative treatment for now including anti histamines, hot showers, saline nasal spray and OTC cough syrup - She will follow up if she has worsening symptoms

## 2016-02-20 NOTE — Patient Instructions (Addendum)
- It was a pleasure seeing you today - We will check your blood work today - We will get an x ray of your neck to assess for the numbness of your hands - Please follow up with me in 3 months - Please follow up in 2 weeks for your flu shot - If your cold does not get better in 1-2 weeks please follow up   Upper Respiratory Infection, Adult Most upper respiratory infections (URIs) are a viral infection of the air passages leading to the lungs. A URI affects the nose, throat, and upper air passages. The most common type of URI is nasopharyngitis and is typically referred to as "the common cold." URIs run their course and usually go away on their own. Most of the time, a URI does not require medical attention, but sometimes a bacterial infection in the upper airways can follow a viral infection. This is called a secondary infection. Sinus and middle ear infections are common types of secondary upper respiratory infections. Bacterial pneumonia can also complicate a URI. A URI can worsen asthma and chronic obstructive pulmonary disease (COPD). Sometimes, these complications can require emergency medical care and may be life threatening.  CAUSES Almost all URIs are caused by viruses. A virus is a type of germ and can spread from one person to another.  RISKS FACTORS You may be at risk for a URI if:   You smoke.   You have chronic heart or lung disease.  You have a weakened defense (immune) system.   You are very young or very old.   You have nasal allergies or asthma.  You work in crowded or poorly ventilated areas.  You work in health care facilities or schools. SIGNS AND SYMPTOMS  Symptoms typically develop 2-3 days after you come in contact with a cold virus. Most viral URIs last 7-10 days. However, viral URIs from the influenza virus (flu virus) can last 14-18 days and are typically more severe. Symptoms may include:   Runny or stuffy (congested) nose.   Sneezing.   Cough.    Sore throat.   Headache.   Fatigue.   Fever.   Loss of appetite.   Pain in your forehead, behind your eyes, and over your cheekbones (sinus pain).  Muscle aches.  DIAGNOSIS  Your health care provider may diagnose a URI by:  Physical exam.  Tests to check that your symptoms are not due to another condition such as:  Strep throat.  Sinusitis.  Pneumonia.  Asthma. TREATMENT  A URI goes away on its own with time. It cannot be cured with medicines, but medicines may be prescribed or recommended to relieve symptoms. Medicines may help:  Reduce your fever.  Reduce your cough.  Relieve nasal congestion. HOME CARE INSTRUCTIONS   Take medicines only as directed by your health care provider.   Gargle warm saltwater or take cough drops to comfort your throat as directed by your health care provider.  Use a warm mist humidifier or inhale steam from a shower to increase air moisture. This may make it easier to breathe.  Drink enough fluid to keep your urine clear or pale yellow.   Eat soups and other clear broths and maintain good nutrition.   Rest as needed.   Return to work when your temperature has returned to normal or as your health care provider advises. You may need to stay home longer to avoid infecting others. You can also use a face mask and careful hand washing  to prevent spread of the virus.  Increase the usage of your inhaler if you have asthma.   Do not use any tobacco products, including cigarettes, chewing tobacco, or electronic cigarettes. If you need help quitting, ask your health care provider. PREVENTION  The best way to protect yourself from getting a cold is to practice good hygiene.   Avoid oral or hand contact with people with cold symptoms.   Wash your hands often if contact occurs.  There is no clear evidence that vitamin C, vitamin E, echinacea, or exercise reduces the chance of developing a cold. However, it is always  recommended to get plenty of rest, exercise, and practice good nutrition.  SEEK MEDICAL CARE IF:   You are getting worse rather than better.   Your symptoms are not controlled by medicine.   You have chills.  You have worsening shortness of breath.  You have brown or red mucus.  You have yellow or brown nasal discharge.  You have pain in your face, especially when you bend forward.  You have a fever.  You have swollen neck glands.  You have pain while swallowing.  You have white areas in the back of your throat. SEEK IMMEDIATE MEDICAL CARE IF:   You have severe or persistent:  Headache.  Ear pain.  Sinus pain.  Chest pain.  You have chronic lung disease and any of the following:  Wheezing.  Prolonged cough.  Coughing up blood.  A change in your usual mucus.  You have a stiff neck.  You have changes in your:  Vision.  Hearing.  Thinking.  Mood. MAKE SURE YOU:   Understand these instructions.  Will watch your condition.  Will get help right away if you are not doing well or get worse.   This information is not intended to replace advice given to you by your health care provider. Make sure you discuss any questions you have with your health care provider.   Document Released: 11/13/2000 Document Revised: 10/04/2014 Document Reviewed: 08/25/2013 Elsevier Interactive Patient Education [employer].

## 2016-02-20 NOTE — Assessment & Plan Note (Signed)
-   Will check lipid profile today - c/w simvastatin

## 2016-02-20 NOTE — Assessment & Plan Note (Signed)
-   A1c is stable at 6 - Will continue to monitor yearly - No further w/u for now

## 2016-02-20 NOTE — Assessment & Plan Note (Signed)
-   Patient complains of paraesthesias in both hands in both hands usually when she is sleeping - She states that it occurs in the hand on the side she is sleeping on and goes to the other side when she switches to sleeping to that side  - Repeat cervical spine x ray with no acute changes - Will monitor for now

## 2016-02-20 NOTE — Assessment & Plan Note (Signed)
-   States her symptoms are well controlled now - She is following up with a therapist who is prescribing her meds for her

## 2016-02-21 LAB — LIPID PANEL
CHOL/HDL RATIO: 5.6 ratio — AB (ref 0.0–4.4)
CHOLESTEROL TOTAL: 218 mg/dL — AB (ref 100–199)
HDL: 39 mg/dL — AB (ref >39)
LDL Calculated: 154 mg/dL — ABNORMAL HIGH (ref 0–99)
TRIGLYCERIDES: 123 mg/dL (ref 0–149)
VLDL Cholesterol Cal: 25 mg/dL (ref 5–40)

## 2016-02-21 LAB — BMP8+ANION GAP
Anion Gap: 17 mmol/L (ref 10.0–18.0)
BUN/Creatinine Ratio: 16 (ref 9–23)
BUN: 14 mg/dL (ref 6–24)
CALCIUM: 9.2 mg/dL (ref 8.7–10.2)
CO2: 21 mmol/L (ref 18–29)
CREATININE: 0.87 mg/dL (ref 0.57–1.00)
Chloride: 103 mmol/L (ref 96–106)
GFR calc Af Amer: 88 mL/min/{1.73_m2} (ref >59)
GFR, EST NON AFRICAN AMERICAN: 76 mL/min/{1.73_m2} (ref >59)
Glucose: 83 mg/dL (ref 65–99)
POTASSIUM: 4.2 mmol/L (ref 3.5–5.2)
Sodium: 141 mmol/L (ref 134–144)

## 2016-02-27 ENCOUNTER — Telehealth: Payer: Self-pay | Admitting: Internal Medicine

## 2016-02-27 NOTE — Telephone Encounter (Signed)
Called to discuss lab results with patient. Noted to have worsening LDL. Discussed with patient and advised her to take her statin consistently. Would recheck in 3 months and calculate ASCVD risk score to see if she would qualify for high intensity statin. Patient is in agreement with plan

## 2016-05-20 ENCOUNTER — Telehealth: Payer: Self-pay | Admitting: Internal Medicine

## 2016-05-20 NOTE — Telephone Encounter (Signed)
APT. REMINDER CALL, LMTCB °

## 2016-05-21 ENCOUNTER — Ambulatory Visit (INDEPENDENT_AMBULATORY_CARE_PROVIDER_SITE_OTHER): Payer: Medicare Other | Admitting: Internal Medicine

## 2016-05-21 ENCOUNTER — Encounter (INDEPENDENT_AMBULATORY_CARE_PROVIDER_SITE_OTHER): Payer: Self-pay

## 2016-05-21 ENCOUNTER — Encounter: Payer: Self-pay | Admitting: Internal Medicine

## 2016-05-21 VITALS — BP 123/76 | HR 85 | Temp 98.2°F | Ht 65.0 in | Wt 193.7 lb

## 2016-05-21 DIAGNOSIS — M25561 Pain in right knee: Secondary | ICD-10-CM | POA: Diagnosis not present

## 2016-05-21 DIAGNOSIS — Z9114 Patient's other noncompliance with medication regimen: Secondary | ICD-10-CM

## 2016-05-21 DIAGNOSIS — K219 Gastro-esophageal reflux disease without esophagitis: Secondary | ICD-10-CM

## 2016-05-21 DIAGNOSIS — Z79899 Other long term (current) drug therapy: Secondary | ICD-10-CM

## 2016-05-21 DIAGNOSIS — R202 Paresthesia of skin: Secondary | ICD-10-CM

## 2016-05-21 DIAGNOSIS — E785 Hyperlipidemia, unspecified: Secondary | ICD-10-CM | POA: Diagnosis not present

## 2016-05-21 DIAGNOSIS — F329 Major depressive disorder, single episode, unspecified: Secondary | ICD-10-CM

## 2016-05-21 DIAGNOSIS — Z23 Encounter for immunization: Secondary | ICD-10-CM | POA: Diagnosis not present

## 2016-05-21 DIAGNOSIS — Z0000 Encounter for general adult medical examination without abnormal findings: Secondary | ICD-10-CM

## 2016-05-21 DIAGNOSIS — Z87891 Personal history of nicotine dependence: Secondary | ICD-10-CM

## 2016-05-21 DIAGNOSIS — G43909 Migraine, unspecified, not intractable, without status migrainosus: Secondary | ICD-10-CM | POA: Diagnosis not present

## 2016-05-21 DIAGNOSIS — J302 Other seasonal allergic rhinitis: Secondary | ICD-10-CM

## 2016-05-21 DIAGNOSIS — F32A Depression, unspecified: Secondary | ICD-10-CM

## 2016-05-21 DIAGNOSIS — G8929 Other chronic pain: Secondary | ICD-10-CM

## 2016-05-21 DIAGNOSIS — M25562 Pain in left knee: Secondary | ICD-10-CM

## 2016-05-21 DIAGNOSIS — R5383 Other fatigue: Secondary | ICD-10-CM | POA: Insufficient documentation

## 2016-05-21 DIAGNOSIS — B079 Viral wart, unspecified: Secondary | ICD-10-CM

## 2016-05-21 MED ORDER — CETIRIZINE HCL 10 MG PO CAPS: 10.0000 mg | ORAL_CAPSULE | Freq: Every day | ORAL | 6 refills | Status: DC | PRN

## 2016-05-21 MED ORDER — SIMVASTATIN 40 MG PO TABS: 40.0000 mg | ORAL_TABLET | Freq: Every day | ORAL | 3 refills | Status: DC

## 2016-05-21 MED ORDER — PANTOPRAZOLE SODIUM 20 MG PO TBEC: 20.0000 mg | DELAYED_RELEASE_TABLET | Freq: Every day | ORAL | 3 refills | Status: DC

## 2016-05-21 NOTE — Assessment & Plan Note (Signed)
-   Patient complains of unilateral HA which is recurrent, assoc with mild nausea and blurry vision  - It is relieved with alleve - She was on sumatriptan in the past for migraines but given mild nature of attacks and relief with alleve I think OTC analgesics should be enough at this time - Will follow up on next visit

## 2016-05-21 NOTE — Assessment & Plan Note (Signed)
-   States pain is better currently - Will continue with voltaren gel prn - Will discuss weight loss strategies on follow up

## 2016-05-21 NOTE — Assessment & Plan Note (Signed)
-   Patient complains of mild fatigue  - This is possibly secondary to her recent cold but given her recent difficulty climbing stairs and doing work will check CBC to rule out anemia

## 2016-05-21 NOTE — Assessment & Plan Note (Signed)
-   Patient states she ran out of her pantoprazole and had recurrent symptoms - Will refill pantoprazole today

## 2016-05-21 NOTE — Assessment & Plan Note (Signed)
-   patient with some mild paresthesias in both hands - Her last A1C in September was in normal range - Will check B12, TSH today - Patient noted to have mildly decreased sensation in right hand and leg compared to left

## 2016-05-21 NOTE — Assessment & Plan Note (Signed)
>>  ASSESSMENT AND PLAN FOR BILATERAL KNEE PAIN WRITTEN ON 05/21/2016  9:28 AM BY NARENDRA, NISCHAL, MD  - States pain is better currently - Will continue with voltaren  gel prn - Will discuss weight loss strategies on follow up

## 2016-05-21 NOTE — Assessment & Plan Note (Signed)
-   patient is following with an outpatient therapist and states her symptoms are better controlled - She only remembers seroquel but not her other medications.  - She will bring them on follow up so I can update her medication list

## 2016-05-21 NOTE — Assessment & Plan Note (Signed)
-   Patient tried salicylic acid for her palmar warts but states that her skin started peeling so she stopped - Wants referral back to dermatology for follow up - Will get appointment set up

## 2016-05-21 NOTE — Patient Instructions (Signed)
-  It was a pleasure seeing you today. Have a great holiday season!! - We will give you a flu shot today - We will check some blood work today to further evaluate your altered sensation  - Please bring your medications on your next visit - Continue to follow with your out patient therapist for depression - Will refer you to dermatology again for your warts

## 2016-05-21 NOTE — Assessment & Plan Note (Signed)
-   Will give flu shot today 

## 2016-05-21 NOTE — Progress Notes (Signed)
   Subjective:    Patient ID: Theresa Kirk, female    DOB: 12/07/1960, 55 y.o.   MRN: [id number]  HPI I have seen and evaluated the patient. She ishere for routine follow up of her HLD, GERD and paraesthesias of her hands. She states she is getting over a cold now but complains of increased fatigue and tripping while climbing stairs. She also complains of a mild HA which occurs intermittently, one sided, relieved with alleve and assoc with mild nausea and some blurry vision  She states her knee pain is better controlled and only uses voltaren gel occasionally.  She also states she only takes her cholesterol medication occasionally.  She follows up with her therapist for depression and they are prescribing her the meds she takes for depression. She did not bring her medications today and only remembers the seroquel she is on   Review of Systems  Constitutional: Positive for fatigue.  HENT: Positive for rhinorrhea.   Respiratory: Positive for cough. Negative for chest tightness and shortness of breath.   Cardiovascular: Negative.   Gastrointestinal: Negative.   Musculoskeletal: Positive for arthralgias and gait problem. Negative for back pain.  Skin: Negative.   Neurological: Positive for headaches. Negative for dizziness, syncope and light-headedness.  Psychiatric/Behavioral: Negative.        Objective:   Physical Exam  Constitutional: She is oriented to person, place, and time. She appears well-developed and well-nourished.  HENT:  Head: Normocephalic and atraumatic.  Cardiovascular: Normal rate, regular rhythm and normal heart sounds.   Pulmonary/Chest: Effort normal and breath sounds normal. No respiratory distress. She has no wheezes.  Abdominal: Soft. Bowel sounds are normal. She exhibits no distension. There is no tenderness.  Musculoskeletal: Normal range of motion. She exhibits no edema.  Neurological: She is alert and oriented to person, place, and time. No cranial  nerve deficit.  Power 5/5 b/l UE, LE Sensation mildly decreased R hand compared to left and R leg compared to left  Skin: Skin is warm. No erythema.  Palmar warts +  Psychiatric: She has a normal mood and affect. Her behavior is normal.          Assessment & Plan:  Please see problem based charting for assessment and plan:

## 2016-05-21 NOTE — Assessment & Plan Note (Signed)
-   Patient states she ran out of her cetirizine and has had worsening allergies - Was previously well controlled on this medication - Will refill today

## 2016-05-21 NOTE — Assessment & Plan Note (Signed)
-   Patient with elevated LDL to the 150s on last visit - Patient states that she only takes her cholesterol medication intermittently - Her 10 year ASCVD score is only 2.5%. Would consider dc'ing statin on next visit if she continues to remain non compliant

## 2016-05-22 ENCOUNTER — Telehealth: Payer: Self-pay | Admitting: Internal Medicine

## 2016-05-22 NOTE — Telephone Encounter (Signed)
Called patient to discuss results of lab work. Explained that her CBC and B 12 levels were normal. Still awaiting TSH. No further work up for now. It is possible that her fatigue may be related to her depression and she follows with an outpatient therapist for this. She will bring her medications on follow up so we can go through them. She expressed understanding.

## 2016-05-23 ENCOUNTER — Telehealth: Payer: Self-pay | Admitting: *Deleted

## 2016-05-23 LAB — CBC WITH DIFFERENTIAL/PLATELET
BASOS: 0 %
Basophils Absolute: 0 10*3/uL (ref 0.0–0.2)
EOS (ABSOLUTE): 0.2 10*3/uL (ref 0.0–0.4)
EOS: 2 %
HEMATOCRIT: 39.8 % (ref 34.0–46.6)
Hemoglobin: 13.4 g/dL (ref 11.1–15.9)
IMMATURE GRANS (ABS): 0 10*3/uL (ref 0.0–0.1)
IMMATURE GRANULOCYTES: 0 %
LYMPHS: 48 %
Lymphocytes Absolute: 5 10*3/uL — ABNORMAL HIGH (ref 0.7–3.1)
MCH: 30.3 pg (ref 26.6–33.0)
MCHC: 33.7 g/dL (ref 31.5–35.7)
MCV: 90 fL (ref 79–97)
Monocytes Absolute: 0.8 10*3/uL (ref 0.1–0.9)
Monocytes: 8 %
NEUTROS PCT: 42 %
Neutrophils Absolute: 4.3 10*3/uL (ref 1.4–7.0)
PLATELETS: 360 10*3/uL (ref 150–379)
RBC: 4.42 x10E6/uL (ref 3.77–5.28)
RDW: 13.8 % (ref 12.3–15.4)
WBC: 10.3 10*3/uL (ref 3.4–10.8)

## 2016-05-23 LAB — VITAMIN B12: Vitamin B-12: 495 pg/mL (ref 232–1245)

## 2016-05-23 LAB — TSH: TSH: 1.37 u[IU]/mL (ref 0.450–4.500)

## 2016-05-23 NOTE — Telephone Encounter (Signed)
CALLED PATIENT LEFT VOICE MESSAGE FOR PATIENT REGARDING HER DERMATOLOGY APPOINTMENT WITH [facility] [address]/ December 27.017   @ 10:30AM TO ARRIVE 10:15AM.

## 2016-06-25 ENCOUNTER — Ambulatory Visit (HOSPITAL_COMMUNITY)
Admission: RE | Admit: 2016-06-25 | Discharge: 2016-06-25 | Disposition: A | Discharge status: Home / Self Care | Payer: Medicare Other | Source: Ambulatory Visit | Attending: Internal Medicine | Admitting: Internal Medicine

## 2016-06-25 ENCOUNTER — Ambulatory Visit (INDEPENDENT_AMBULATORY_CARE_PROVIDER_SITE_OTHER): Payer: Medicare Other | Admitting: Internal Medicine

## 2016-06-25 DIAGNOSIS — F1220 Cannabis dependence, uncomplicated: Secondary | ICD-10-CM

## 2016-06-25 DIAGNOSIS — R053 Chronic cough: Secondary | ICD-10-CM

## 2016-06-25 DIAGNOSIS — R05 Cough: Secondary | ICD-10-CM | POA: Insufficient documentation

## 2016-06-25 DIAGNOSIS — I700 Atherosclerosis of aorta: Secondary | ICD-10-CM | POA: Diagnosis not present

## 2016-06-25 DIAGNOSIS — Z87891 Personal history of nicotine dependence: Secondary | ICD-10-CM

## 2016-06-25 DIAGNOSIS — K219 Gastro-esophageal reflux disease without esophagitis: Secondary | ICD-10-CM

## 2016-06-25 DIAGNOSIS — Z79899 Other long term (current) drug therapy: Secondary | ICD-10-CM

## 2016-06-25 MED ORDER — FLUTICASONE PROPIONATE 50 MCG/ACT NA SUSP: 2.0000 | Freq: Every day | NASAL | 2 refills | Status: DC

## 2016-06-25 NOTE — Assessment & Plan Note (Addendum)
Reports having a cough since October. Was productive with green phlegm at that time. Resolved for about a week in December but returned after seeing her PCP and getting her flu shot. Has been coughing so vigorously she has had urinary incontinence. Still having green sputum production. Cough has been constant throughout the day but it is worse as the day goes on. Denies any fevers or chills, nausea or vomiting, chest pain, sore throat, rhinorrhea, sinus congestion/pressure. Does report shortness of breath with minimal exertion even with conversation. Bilateral sharp ear pain and ringing. Denies any cigarette use but does report marijuana use daily. Denies any other recreational drug use (remote hx of crack cocaine abuse). Does have a history of season allergies and cetirizine use. Reports history of asthma as a child. Does have a history of GERD and is currently on Protonix 20 mg daily. No cough with laying down flat. Denies any reflux symptoms. Not currently on ACE-I therapy.   A/P CXR today unremarkable with no explanation of her chronic cough. Will do 1 month trial of Flonase. Would consider PFTs in the future if no symptomatic improvement. Continue PPI.   F/U in 1 month

## 2016-06-25 NOTE — Progress Notes (Signed)
   CC: Cough  HPI:  Theresa Kirk is a 56 y.o. female with a past medical history listed below here today with complaints of cough.  Reports having a cough since October. Was productive with green phlegm at that time. Resolved for about a week in December but returned after seeing her PCP and getting her flu shot. Has been coughing so vigorously she has had urinary incontinence. Still having green sputum production. Cough has been constant throughout the day but it is worse as the day goes on. Denies any fevers or chills, nausea or vomiting, chest pain, sore throat, rhinorrhea, sinus congestion/pressure. Does report shortness of breath with minimal exertion even with conversation. Bilateral sharp ear pain and ringing. Denies any cigarette use but does report marijuana use daily. Denies any other recreational drug use (remote hx of crack cocaine abuse). Does have a history of season allergies and cetirizine use. Reports history of asthma as a child. Does have a history of GERD and is currently on Protonix 20 mg daily. No cough with laying down flat. Denies any reflux symptoms. Not currently on ACE-I therapy.   Past Medical History:  Diagnosis Date  . Alcohol abuse   . Arthritis   . Carpal tunnel syndrome   . Depression   . Fibromyalgia   . High cholesterol     Review of Systems:   Negative except as noted in HPI  Physical Exam:  Vitals:   06/25/16 1004  BP: 121/90  Pulse: 71  Temp: 97.8 F (36.6 C)  TempSrc: Oral  SpO2: 100%  Weight: 194 lb 1.6 oz (88 kg)  Height: 5\' 5"  (1.651 m)   Physical Exam  Constitutional: She is well-developed, well-nourished, and in no distress. No distress.  HENT:  Head: Normocephalic and atraumatic.  Right Ear: External ear normal.  Left Ear: External ear normal.  Mouth/Throat: Oropharynx is clear and moist. No oropharyngeal exudate.  Cardiovascular: Normal rate.   Pulmonary/Chest: Effort normal and breath sounds normal.  Abdominal: Soft.  Bowel sounds are normal.  Lymphadenopathy:    She has no cervical adenopathy.  Skin: Skin is warm and dry.   Assessment & Plan:   See Encounters Tab for problem based charting.  Patient discussed with Dr. Eppie Gibson

## 2016-06-25 NOTE — Patient Instructions (Signed)
Theresa Kirk,  I would like to get a chest xray today. I will give you a call this afternoon and let you know the results.  I am going to start you on a nasal spray called Flonase to try and help with your symptoms.  Please follow up with Dr. Dareen Piano in 1 month

## 2016-06-27 NOTE — Progress Notes (Signed)
Case discussed with Dr. Charlynn Grimes  at the time of the visit.  We reviewed the resident's history and exam and pertinent patient test results.  I agree with the assessment, diagnosis and plan of care documented in the resident's note with the following correction:  Smoking marijuana could easily contribute to the chronic cough and efforts of cessation should be undertaken.

## 2016-07-18 ENCOUNTER — Encounter: Payer: Self-pay | Admitting: Pulmonary Disease

## 2016-07-18 ENCOUNTER — Ambulatory Visit (INDEPENDENT_AMBULATORY_CARE_PROVIDER_SITE_OTHER): Payer: Medicare Other | Admitting: Pulmonary Disease

## 2016-07-18 DIAGNOSIS — Z87891 Personal history of nicotine dependence: Secondary | ICD-10-CM | POA: Diagnosis not present

## 2016-07-18 DIAGNOSIS — J069 Acute upper respiratory infection, unspecified: Secondary | ICD-10-CM | POA: Diagnosis not present

## 2016-07-18 MED ORDER — AMOXICILLIN-POT CLAVULANATE 875-125 MG PO TABS: 1.0000 | ORAL_TABLET | Freq: Two times a day (BID) | ORAL | 0 refills | Status: AC

## 2016-07-18 NOTE — Progress Notes (Signed)
   CC: cough  HPI:  [name] is a 56 y.o. woman with history as noted below here with cough.  She has been having a cough since October. She has been taking benadryl/phenylephrine/acetaminophen, Dayquil. The Flonase made her feel better for a couple of days. No antibiotics since October. She was having sinus pressure. Her nasal drainage has been green. This episode has been ongoing since about January. No sick contacts. Cough productive of green mucous. She smokes marijuana. She has chills but no fevers.    Past Medical History:  Diagnosis Date  . Alcohol abuse   . Arthritis   . Carpal tunnel syndrome   . Depression   . Fibromyalgia   . High cholesterol     Review of Systems:   +nausea, no vomiting No diarrhea +myalgias  Physical Exam:  Vitals:   07/18/16 0850  BP: 118/76  Pulse: 85  Temp: 97.8 F (36.6 C)  TempSrc: Oral  SpO2: 100%  Weight: 191 lb 4.8 oz (86.8 kg)  Height: 5\' 5"  (1.651 m)   General Apperance: NAD HEENT: Normocephalic, atraumatic, anicteric sclera Neck: Supple, trachea midline Lungs: Clear to auscultation bilaterally. No wheezes, rhonchi or rales. Breathing comfortably Heart: Regular rate and rhythm, no murmur/rub/gallop Abdomen: Soft, nontender, nondistended, no rebound/guarding Extremities: Warm and well perfused, no edema Skin: No rashes or lesions Neurologic: Alert and interactive. No gross deficits.  Assessment & Plan:   See Encounters Tab for problem based charting.  Patient discussed with Dr. Beryle Beams

## 2016-07-18 NOTE — Assessment & Plan Note (Signed)
Assessment: Failing to improve with Flonase. Will try a course of antibiotics. Possible sinusitis.  Plan: Augmentin 875-125mg  BID for 5 days.

## 2016-07-18 NOTE — Patient Instructions (Signed)
Take your antibiotic twice a day until you finish it Follow up in 4-6 weeks

## 2016-07-22 NOTE — Progress Notes (Signed)
Medicine attending: Medical history, presenting problems, physical findings, and medications, reviewed with resident physician Dr Jennifer Krall on the day of the patient visit and I concur with her evaluation and management plan. 

## 2016-08-13 ENCOUNTER — Ambulatory Visit: Payer: Medicare Other | Admitting: Internal Medicine

## 2016-08-14 ENCOUNTER — Ambulatory Visit (INDEPENDENT_AMBULATORY_CARE_PROVIDER_SITE_OTHER): Payer: Medicare Other | Admitting: Internal Medicine

## 2016-08-14 ENCOUNTER — Other Ambulatory Visit (HOSPITAL_COMMUNITY)
Admission: RE | Admit: 2016-08-14 | Discharge: 2016-08-14 | Disposition: A | Discharge status: Home / Self Care | Payer: Medicare Other | Source: Ambulatory Visit | Attending: Internal Medicine | Admitting: Internal Medicine

## 2016-08-14 VITALS — BP 117/72 | HR 103 | Temp 98.4°F | Ht 65.0 in | Wt 194.3 lb

## 2016-08-14 DIAGNOSIS — E785 Hyperlipidemia, unspecified: Secondary | ICD-10-CM

## 2016-08-14 DIAGNOSIS — K219 Gastro-esophageal reflux disease without esophagitis: Secondary | ICD-10-CM | POA: Diagnosis not present

## 2016-08-14 DIAGNOSIS — Z809 Family history of malignant neoplasm, unspecified: Secondary | ICD-10-CM

## 2016-08-14 DIAGNOSIS — Z124 Encounter for screening for malignant neoplasm of cervix: Secondary | ICD-10-CM | POA: Insufficient documentation

## 2016-08-14 DIAGNOSIS — F32A Depression, unspecified: Secondary | ICD-10-CM

## 2016-08-14 DIAGNOSIS — G43909 Migraine, unspecified, not intractable, without status migrainosus: Secondary | ICD-10-CM

## 2016-08-14 DIAGNOSIS — R202 Paresthesia of skin: Secondary | ICD-10-CM | POA: Diagnosis not present

## 2016-08-14 DIAGNOSIS — Z79899 Other long term (current) drug therapy: Secondary | ICD-10-CM | POA: Diagnosis not present

## 2016-08-14 DIAGNOSIS — Z0000 Encounter for general adult medical examination without abnormal findings: Secondary | ICD-10-CM

## 2016-08-14 DIAGNOSIS — F329 Major depressive disorder, single episode, unspecified: Secondary | ICD-10-CM | POA: Diagnosis not present

## 2016-08-14 DIAGNOSIS — B078 Other viral warts: Secondary | ICD-10-CM | POA: Diagnosis not present

## 2016-08-14 DIAGNOSIS — Z1151 Encounter for screening for human papillomavirus (HPV): Secondary | ICD-10-CM | POA: Diagnosis present

## 2016-08-14 DIAGNOSIS — N898 Other specified noninflammatory disorders of vagina: Secondary | ICD-10-CM | POA: Diagnosis not present

## 2016-08-14 DIAGNOSIS — J069 Acute upper respiratory infection, unspecified: Secondary | ICD-10-CM | POA: Diagnosis not present

## 2016-08-14 DIAGNOSIS — B079 Viral wart, unspecified: Secondary | ICD-10-CM

## 2016-08-14 DIAGNOSIS — Z87891 Personal history of nicotine dependence: Secondary | ICD-10-CM | POA: Diagnosis not present

## 2016-08-14 DIAGNOSIS — Z113 Encounter for screening for infections with a predominantly sexual mode of transmission: Secondary | ICD-10-CM | POA: Insufficient documentation

## 2016-08-14 NOTE — Patient Instructions (Addendum)
-   It was a pleasure seeing you today - Please follow up with dermatology - Please make an appointment with another therapist at [facility] and bring your medications here on your next appointment - We will refer you to a neurologist after your next visit if your tingling and numbness persist - We will call you with the results of the Pap smear - Please follow up in 3 months

## 2016-08-14 NOTE — Assessment & Plan Note (Signed)
-   Will need to check a hepatitis C screen on her follow up appointment

## 2016-08-14 NOTE — Assessment & Plan Note (Signed)
-   She states her symptoms are well controlled on pantoprazole - She gets recurrent symptoms off her PPI - Will c/w PPI for now

## 2016-08-14 NOTE — Assessment & Plan Note (Signed)
-   patient states she only gets headaches occasionally and her symptoms are much better controlled now - She is only on OTC alleve for when she gets a HA and this relieves her symptoms - No further work up for now.

## 2016-08-14 NOTE — Assessment & Plan Note (Signed)
-   patient follows up at [facility] for her depression - Unfortunately her therapist left the practice and she has not been assigned a new one yet - She will call the office this week for a follow up appointment - She will also bring her medications on her follow up visit so we can add them to her list - Will also attempt to get notes from [facility]

## 2016-08-14 NOTE — Assessment & Plan Note (Signed)
-   Patient complains of intermittent tingling and numbness in her fingers as well as b/l LE - Her TSH, B12 and A1C have been wnl - She was noted to have mildly decreased sensation over her left leg compared to her right - The etiology of her symptoms remains unclear at this time - Would consider referral to neurology for nerve conduction studies if this persists

## 2016-08-14 NOTE — Assessment & Plan Note (Addendum)
-   Patients symptoms are much improved after her course of Augmentin - She still complains of occasional cough with scant darkish phlegm. No hemoptysis, no fevers/chills, no sore throat, no sinus pressure - Her lungs are clear to ausculation and she had no post nasal drip/oropharyngeal exudate/cervical LAD on exam - Had a CXR in Jan which was unremarkable - No further treatment at this time. Will continue to monitor

## 2016-08-14 NOTE — Progress Notes (Signed)
   Subjective:    Patient ID: Theresa Kirk, female    DOB: August 18, 1960, 56 y.o.   MRN: [id number]  HPI  Patient is here for follow up of her recent URI as well as routine follow up for her migraines and GERD.  Patient states her URI symptoms are much improved after her recent course of Augmentin. She states she still has some persistent cough with scant darkish phlegm but much improved from prior. No fevers/chills, no sore throat, no hemoptysis.  Her HAs are much improved and only happen occasionally now and are relieved with OTC alleve.   Patient also states that she has not been able to follow up with her dermatologist for her palmar warts and plans on making an appointment this week.  She follows up with an outpatient therapist at [facility] for her depression but notes that she has not had an appointment recently as her therapist left and she has not been set up with a new therapist yet.  Review of Systems  Constitutional: Negative.   HENT: Negative for congestion, postnasal drip, rhinorrhea, sinus pressure, sore throat and voice change.   Respiratory: Positive for cough. Negative for shortness of breath and wheezing.   Cardiovascular: Negative.   Gastrointestinal: Negative.   Musculoskeletal: Negative.   Neurological: Positive for numbness. Negative for dizziness, syncope, weakness and light-headedness.  Psychiatric/Behavioral: Negative.        Objective:   Physical Exam  Constitutional: She is oriented to person, place, and time. She appears well-developed and well-nourished.  HENT:  Head: Normocephalic and atraumatic.  Mouth/Throat: No oropharyngeal exudate.  Neck: Neck supple.  Cardiovascular: Normal rate, regular rhythm and normal heart sounds.   Pulmonary/Chest: Effort normal. She has no wheezes. She has no rales.  Abdominal: Soft. Bowel sounds are normal. She exhibits no distension. There is no tenderness.  Genitourinary: Vaginal discharge found.  Genitourinary  Comments: Patient noted to have whitish discharge while doing her PAP smear  Musculoskeletal: Normal range of motion. She exhibits no edema.  Lymphadenopathy:    She has no cervical adenopathy.  Neurological: She is alert and oriented to person, place, and time.  Mildly decreased sensation over left leg compared to her right  Skin: Skin is warm. No erythema.  Psychiatric: She has a normal mood and affect. Her behavior is normal.          Assessment & Plan:  Please see problem based charting for assessment and plan:

## 2016-08-14 NOTE — Assessment & Plan Note (Signed)
-   Patient states she is compliant with her zocor - She has been on this medication chronically but her last calculated 10 year ASCVD was only 2.5% - Will recheck her lipid panel on follow up and discuss with patient about whether she wants to continue on this medication or not

## 2016-08-14 NOTE — Assessment & Plan Note (Signed)
-   Patient was diagnosed with palmar warts by her dermatologist and had been prescribed salicylic acid which she stopped taking because her skin was peeling - Patient wants to follow up with dermatology for this as an outpatient - She will call and make an appointment this week

## 2016-08-14 NOTE — Assessment & Plan Note (Signed)
-   Patient was due for her Pap  Smear today - On examination she was noted to have a whitish vaginal discharge. Wet prep and GC/chlamydia testing done - Sample was sent to lab for testing

## 2016-08-15 ENCOUNTER — Telehealth: Payer: Self-pay | Admitting: Internal Medicine

## 2016-08-15 LAB — CERVICOVAGINAL ANCILLARY ONLY
CHLAMYDIA, DNA PROBE: NEGATIVE
NEISSERIA GONORRHEA: NEGATIVE
WET PREP (BD AFFIRM): POSITIVE — AB

## 2016-08-15 MED ORDER — FLUCONAZOLE 150 MG PO TABS: 150.0000 mg | ORAL_TABLET | Freq: Once | ORAL | 0 refills | Status: AC

## 2016-08-15 MED ORDER — METRONIDAZOLE 500 MG PO TABS: 500.0000 mg | ORAL_TABLET | Freq: Two times a day (BID) | ORAL | 0 refills | Status: AC

## 2016-08-15 NOTE — Telephone Encounter (Signed)
Wet prep showing candida and Gardnerella. Will treat patient with metronidazole 500 mg BID for 7 days and diflucan 150 mg once to cover for BV and candidal infections. Discussed with patient via phone and she expressed understanding and is in agreement with plan

## 2016-08-16 ENCOUNTER — Other Ambulatory Visit: Payer: Self-pay

## 2016-08-16 NOTE — Telephone Encounter (Signed)
Want metroNIDAZOLE (FLAGYL) 500 MG tablet, and Fluconazole to sent to [facility] on [address]. Please call pt back.

## 2016-08-16 NOTE — Telephone Encounter (Signed)
Pt does not want meds at [facility] anymore, she will have them transferred to [facility]

## 2016-08-17 LAB — CYTOLOGY - PAP
ADEQUACY: ABSENT
DIAGNOSIS: NEGATIVE
HPV: NOT DETECTED

## 2016-08-19 ENCOUNTER — Telehealth: Payer: Self-pay | Admitting: Internal Medicine

## 2016-08-19 NOTE — Telephone Encounter (Addendum)
Called patient to discuss results of Pap smear. Patient with an adequate sample and no evidence of malignancy but transition zone was absent. Discussed with patient about the option of cervical testing for HPV and she is in agreement with this. Spoke to front office who will schedule patient for appointment in [facility] for HPV testing. Depending on results will decide if further work up is warranted.   Addendum: On follow-up appointment patient should have a repeat Pap smear. This can be done at no additional cost to the patient and should not be billed as it is within 90 days of her last Pap smear which did not have a sample of her transition zone. Please inform the lab at the time of collection of sample for Pap smear.

## 2016-08-22 ENCOUNTER — Ambulatory Visit (INDEPENDENT_AMBULATORY_CARE_PROVIDER_SITE_OTHER): Payer: Medicare Other | Admitting: Internal Medicine

## 2016-08-22 VITALS — BP 129/73 | HR 68 | Temp 98.0°F | Ht 65.0 in | Wt 192.4 lb

## 2016-08-22 DIAGNOSIS — Z8742 Personal history of other diseases of the female genital tract: Secondary | ICD-10-CM

## 2016-08-22 DIAGNOSIS — R0989 Other specified symptoms and signs involving the circulatory and respiratory systems: Secondary | ICD-10-CM | POA: Diagnosis not present

## 2016-08-22 DIAGNOSIS — J029 Acute pharyngitis, unspecified: Secondary | ICD-10-CM | POA: Diagnosis not present

## 2016-08-22 DIAGNOSIS — Z87891 Personal history of nicotine dependence: Secondary | ICD-10-CM

## 2016-08-22 DIAGNOSIS — R0981 Nasal congestion: Secondary | ICD-10-CM

## 2016-08-22 DIAGNOSIS — R05 Cough: Secondary | ICD-10-CM

## 2016-08-22 DIAGNOSIS — J069 Acute upper respiratory infection, unspecified: Secondary | ICD-10-CM

## 2016-08-22 DIAGNOSIS — Z0142 Encounter for cervical smear to confirm findings of recent normal smear following initial abnormal smear: Secondary | ICD-10-CM

## 2016-08-22 DIAGNOSIS — Z124 Encounter for screening for malignant neoplasm of cervix: Secondary | ICD-10-CM

## 2016-08-22 MED ORDER — DM-GUAIFENESIN ER 30-600 MG PO TB12: 1.0000 | ORAL_TABLET | Freq: Two times a day (BID) | ORAL | 0 refills | Status: DC

## 2016-08-22 NOTE — Patient Instructions (Signed)
Please use the Neti Pot to cleans your sinuses.  Take tylenol for sore throat.  Take Mucinex for your cough.  Keep using flonase please.   Use salt water nasal sprays as well. Keep taking zyrtec.  We will repeat PAP today and call you with the results.

## 2016-08-22 NOTE — Assessment & Plan Note (Signed)
Had previous URI infection treated with augmentin and had improvement of that which she reported last visit. But she is having URI symptoms again since last clinic visit with nasal congestion, runny nose, and coughing. Exam is normal.   - this is most likely viral URI (likely new infection since last visit) and also may have allergic component as well. Will treat symptomatically with Neti Pot sinus clean, flonase (asked to start using it again), zyrtec, saline nasal sprays, and mucinex.

## 2016-08-22 NOTE — Progress Notes (Signed)
   CC: cold symtpoms and pap f/up  HPI:  [name] is a 56 y.o. with pmh as listed below is here for pap f/up and cold symtpoms.  Pap: no malignancy but transformation zone was absent. Had BV and was given metronidazole for this. Spoke to Dr. Dareen Piano and he recommended doing repeat PAP.   Patient also started having a cold since last visit 3/14. Having runny nose, nasal congestion, throat irritation, and cough. No fevers, no sick contacts, no n/v, sob, chest pain, sinus pain, ear pain. She feels that our clinic area was being cleaned that time which triggered the cold but it never got better. She was told to take zyretec and flonase but she is not using flonase as she feels it does not help. Tried taking dayquil without any improvement.    Past Medical History:  Diagnosis Date  . Alcohol abuse   . Arthritis   . Carpal tunnel syndrome   . Depression   . Fibromyalgia   . High cholesterol     Review of Systems:   Review of Systems  Constitutional: Negative for chills and fever.  HENT: Positive for congestion and sore throat. Negative for ear discharge, ear pain, hearing loss and sinus pain.   Respiratory: Positive for cough. Negative for sputum production, shortness of breath and wheezing.   Cardiovascular: Negative for chest pain.  Gastrointestinal: Negative for abdominal pain, heartburn, nausea and vomiting.  Musculoskeletal: Negative for myalgias.     Physical Exam:  Vitals:   08/22/16 1033  BP: 129/73  Pulse: 68  Temp: 98 F (36.7 C)  TempSrc: Oral  SpO2: 100%  Weight: 192 lb 6.4 oz (87.3 kg)  Height: 5\' 5"  (1.651 m)   Physical Exam  Constitutional: She is oriented to person, place, and time. She appears well-developed and well-nourished.  HENT:  Head: Normocephalic and atraumatic.  Eyes: Conjunctivae and EOM are normal. Pupils are equal, round, and reactive to light. Right eye exhibits no discharge. Left eye exhibits no discharge.  Neck: Normal range of  motion. Neck supple.  Cardiovascular: Normal rate and regular rhythm.  Exam reveals no gallop and no friction rub.   No murmur heard. Respiratory: Effort normal and breath sounds normal. No stridor. No respiratory distress. She has no wheezes.  GI: Soft. Bowel sounds are normal.  Genitourinary: Vagina normal. There is no rash, tenderness, lesion or injury on the right labia. There is no rash, tenderness, lesion or injury on the left labia. No erythema, tenderness or bleeding in the vagina. No foreign body in the vagina. No signs of injury around the vagina. No vaginal discharge found.  Genitourinary Comments: Performed PAP smear today. No discharge, tenderness, erythema, or lesions seen.   Musculoskeletal: Normal range of motion. She exhibits no edema.  Lymphadenopathy:    She has no cervical adenopathy.  Neurological: She is alert and oriented to person, place, and time.    Assessment & Plan:   See Encounters Tab for problem based charting.  Patient discussed with Dr. Beryle Beams

## 2016-08-22 NOTE — Assessment & Plan Note (Signed)
Repeated PAP today as last pap smear sample was missing the transformation zone.

## 2016-08-22 NOTE — Progress Notes (Signed)
Medicine attending: Medical history, presenting problems, physical findings, and medications, reviewed with resident physician Dr Tasrif Ahmed on the day of the patient visit and I concur with his evaluation and management plan. 

## 2016-08-26 LAB — CYTOLOGY - PAP
Adequacy: ABSENT
Diagnosis: NEGATIVE

## 2016-09-04 DIAGNOSIS — D2371 Other benign neoplasm of skin of right lower limb, including hip: Secondary | ICD-10-CM | POA: Diagnosis not present

## 2016-09-04 DIAGNOSIS — B078 Other viral warts: Secondary | ICD-10-CM | POA: Diagnosis not present

## 2016-09-04 DIAGNOSIS — D239 Other benign neoplasm of skin, unspecified: Secondary | ICD-10-CM | POA: Diagnosis not present

## 2016-09-11 DIAGNOSIS — Z4802 Encounter for removal of sutures: Secondary | ICD-10-CM | POA: Diagnosis not present

## 2016-09-11 DIAGNOSIS — D2371 Other benign neoplasm of skin of right lower limb, including hip: Secondary | ICD-10-CM | POA: Diagnosis not present

## 2016-09-11 DIAGNOSIS — B078 Other viral warts: Secondary | ICD-10-CM | POA: Diagnosis not present

## 2016-09-12 ENCOUNTER — Other Ambulatory Visit: Payer: Self-pay | Admitting: Internal Medicine

## 2016-09-12 DIAGNOSIS — Z1231 Encounter for screening mammogram for malignant neoplasm of breast: Secondary | ICD-10-CM

## 2016-09-27 ENCOUNTER — Ambulatory Visit
Admission: RE | Admit: 2016-09-27 | Discharge: 2016-09-27 | Disposition: A | Discharge status: Home / Self Care | Payer: Medicare Other | Source: Ambulatory Visit | Attending: Internal Medicine | Admitting: Internal Medicine

## 2016-09-27 DIAGNOSIS — Z1231 Encounter for screening mammogram for malignant neoplasm of breast: Secondary | ICD-10-CM

## 2016-10-22 ENCOUNTER — Ambulatory Visit (INDEPENDENT_AMBULATORY_CARE_PROVIDER_SITE_OTHER): Payer: Medicare Other | Admitting: Internal Medicine

## 2016-10-22 ENCOUNTER — Encounter: Payer: Self-pay | Admitting: Internal Medicine

## 2016-10-22 VITALS — BP 120/70 | HR 91 | Temp 91.0°F | Ht 65.0 in | Wt 191.5 lb

## 2016-10-22 DIAGNOSIS — R05 Cough: Secondary | ICD-10-CM

## 2016-10-22 DIAGNOSIS — G8929 Other chronic pain: Secondary | ICD-10-CM

## 2016-10-22 DIAGNOSIS — M25561 Pain in right knee: Secondary | ICD-10-CM

## 2016-10-22 DIAGNOSIS — R053 Chronic cough: Secondary | ICD-10-CM

## 2016-10-22 DIAGNOSIS — M25562 Pain in left knee: Secondary | ICD-10-CM | POA: Diagnosis not present

## 2016-10-22 MED ORDER — MELOXICAM 15 MG PO TABS: 15.0000 mg | ORAL_TABLET | Freq: Every day | ORAL | 0 refills | Status: DC

## 2016-10-22 MED ORDER — PANTOPRAZOLE SODIUM 40 MG PO TBEC: 40.0000 mg | DELAYED_RELEASE_TABLET | Freq: Every day | ORAL | 1 refills | Status: DC

## 2016-10-22 NOTE — Progress Notes (Signed)
   CC: leg pain  HPI:  [name] is a 56 y.o. who presents today for leg pain. Please see assessment & plan for status of chronic medical problems.   Past Medical History:  Diagnosis Date  . Alcohol abuse   . Arthritis   . Carpal tunnel syndrome   . Depression   . Fibromyalgia   . High cholesterol     Review of Systems:  Please see each problem below for a pertinent review of systems.  Physical Exam:  Vitals:   10/22/16 1035  BP: 120/70  Pulse: 91  Temp: (!) 91 F (32.8 C)  TempSrc: Oral  SpO2: 100%  Weight: 191 lb 8 oz (86.9 kg)  Height: 5\' 5"  (1.651 m)   Physical Exam  Constitutional: She is oriented to person, place, and time. No distress.  HENT:  Head: Normocephalic and atraumatic.  Eyes: Conjunctivae are normal. No scleral icterus.  Cardiovascular: Normal rate and regular rhythm.   Pulmonary/Chest: Effort normal. No respiratory distress.  Neurological: She is alert and oriented to person, place, and time.  Skin: Skin is warm and dry. She is not diaphoretic.    Assessment & Plan:   See Encounters Tab for problem based charting.  Patient discussed with Dr. Daryll Drown

## 2016-10-22 NOTE — Assessment & Plan Note (Signed)
Assessment Over the last several months, her knee and ankle pain has worsened with carrying weight up and down the stairs in her housing. She takes naproxen 2 tablets/day and acetaminophen along with the voltaren gel without much relief. She is more concerned about the paperwork our office has not received from the [employer] verifying a medical need to move her apartment to a lower floor. She denies any history of GI bleeding or peptic ulcer disease though takes her NSAIDs with food to avoid any associated abdominal pain.  Plan -Prescribed meloxicam 15 mg x 10 days as a short-course of anti-inflammatory therapy.  -Advised her to avoid other NSAIDs, like ibuprofen and naproxen, with this medication -Speak with office staff about how to procure this paperwork

## 2016-10-22 NOTE — Assessment & Plan Note (Addendum)
Assessment Her productive cough is no better and has been ongoing since October 2017. Per review of the chart, she was tried on steroid nasal spray and second-generation antihistamine as well as PPI. With allergy season, she recalls feeling some congestion and swelling underneath her eyes in the morning though denies an itchy eyes or rhinorrhea. She acknowledges feeling reflux and taking pantoprazole 20 mg with meals. She recalls a childhood history of asthma but is not on therapy. She denies any weight loss which I confirmed by reviewing the weights in our chart. While she smokes marijuana frequently, she denies ongoing tobacco, alcohol, or other illicit drug use.   Her cough may be multifactorial in the setting of poorly controlled allergic rhinitis and GERD, and in the absence of warning signs/symptoms, we can proceed with a methodical workup. Review of her CXR from January was without any acute findings as well to account for her symptoms.  Plan -Advised she try a full-dose course of pantoprazole 40 mg x 8 weeks with PCP follow-up thereafter to reassess. Recommended she double her current dose and refill the new dose to which she agreed. -Order full PFT with methacholine challenge to assess for asthma or other lung disease -Advised reducing marijuana use as well  ADDENDUM 11/07/2016  1:52 PM:  FEV1 95% of predicted FEV1/FVC 81% which is inconsistent with a diagnosis of obstructive lung disease. Flow volume loops look grossly normal. We will await methacholine challenge test to assess for asthma. I relayed results to her by phone.

## 2016-10-22 NOTE — Assessment & Plan Note (Signed)
>>  ASSESSMENT AND PLAN FOR BILATERAL KNEE PAIN WRITTEN ON 10/22/2016 11:46 AM BY PATEL, RUSHIL V, MD  Assessment Over the last several months, her knee and ankle pain has worsened with carrying weight up and down the stairs in her housing. She takes naproxen 2 tablets/day and acetaminophen  along with the voltaren  gel without much relief. She is more concerned about the paperwork our office has not received from the [employer] verifying a medical need to move her apartment to a lower floor. She denies any history of GI bleeding or peptic ulcer disease though takes her NSAIDs with food to avoid any associated abdominal pain.  Plan -Prescribed meloxicam  15 mg x 10 days as a short-course of anti-inflammatory therapy.  -Advised her to avoid other NSAIDs, like ibuprofen  and naproxen, with this medication -Speak with office staff about how to procure this paperwork

## 2016-10-22 NOTE — Patient Instructions (Addendum)
Please take two tablets of your acid medication until you run out about 30 minutes before your eat.  We will work on your paperwork and the lung testing in the mean time. If you don't hear back from [place] in 1 week, please call us back.   Please see Dr. Dareen Piano in 2-3 months.

## 2016-10-23 NOTE — Progress Notes (Signed)
Internal Medicine Clinic Attending  Case discussed with Dr. Patel,Rushil at the time of the visit.  We reviewed the resident's history and exam and pertinent patient test results.  I agree with the assessment, diagnosis, and plan of care documented in the resident's note.  

## 2016-10-31 NOTE — Addendum Note (Signed)
Addended by: Riccardo Dubin on: 10/31/2016 02:33 PM   Modules accepted: Orders

## 2016-11-07 ENCOUNTER — Ambulatory Visit (HOSPITAL_COMMUNITY)
Admission: RE | Admit: 2016-11-07 | Discharge: 2016-11-07 | Disposition: A | Discharge status: Home / Self Care | Payer: Medicare Other | Source: Ambulatory Visit | Attending: Internal Medicine | Admitting: Internal Medicine

## 2016-11-07 DIAGNOSIS — R05 Cough: Secondary | ICD-10-CM | POA: Diagnosis not present

## 2016-11-07 DIAGNOSIS — R053 Chronic cough: Secondary | ICD-10-CM

## 2016-11-07 LAB — PULMONARY FUNCTION TEST
DL/VA % PRED: 87 %
DL/VA: 4.31 ml/min/mmHg/L
DLCO UNC % PRED: 72 %
DLCO unc: 18.51 ml/min/mmHg
FEF 25-75 POST: 2.63 L/s
FEF 25-75 PRE: 2.27 L/s
FEF2575-%Change-Post: 15 %
FEF2575-%PRED-PRE: 96 %
FEF2575-%Pred-Post: 111 %
FEV1-%Change-Post: 4 %
FEV1-%PRED-POST: 98 %
FEV1-%Pred-Pre: 95 %
FEV1-Post: 2.29 L
FEV1-Pre: 2.19 L
FEV1FVC-%CHANGE-POST: 0 %
FEV1FVC-%PRED-PRE: 99 %
FEV6-%CHANGE-POST: 4 %
FEV6-%PRED-PRE: 95 %
FEV6-%Pred-Post: 99 %
FEV6-Post: 2.82 L
FEV6-Pre: 2.7 L
FEV6FVC-%Change-Post: 0 %
FEV6FVC-%Pred-Post: 103 %
FEV6FVC-%Pred-Pre: 102 %
FVC-%CHANGE-POST: 4 %
FVC-%Pred-Post: 96 %
FVC-%Pred-Pre: 93 %
FVC-PRE: 2.71 L
FVC-Post: 2.82 L
POST FEV6/FVC RATIO: 100 %
PRE FEV1/FVC RATIO: 81 %
Post FEV1/FVC ratio: 81 %
Pre FEV6/FVC Ratio: 100 %
RV % pred: 126 %
RV: 2.44 L
TLC % PRED: 98 %
TLC: 5.15 L

## 2016-11-07 MED ORDER — ALBUTEROL SULFATE (2.5 MG/3ML) 0.083% IN NEBU
2.5000 mg | INHALATION_SOLUTION | Freq: Once | RESPIRATORY_TRACT | Status: AC
Start: 2016-11-07 — End: 2016-11-07
  Administered 2016-11-07: 2.5 mg via RESPIRATORY_TRACT

## 2017-02-27 ENCOUNTER — Ambulatory Visit (HOSPITAL_COMMUNITY)
Admission: RE | Admit: 2017-02-27 | Discharge: 2017-02-27 | Disposition: A | Discharge status: Home / Self Care | Payer: Medicare Other | Source: Ambulatory Visit | Attending: Internal Medicine | Admitting: Internal Medicine

## 2017-02-27 ENCOUNTER — Ambulatory Visit (INDEPENDENT_AMBULATORY_CARE_PROVIDER_SITE_OTHER): Payer: Medicare Other | Admitting: Internal Medicine

## 2017-02-27 ENCOUNTER — Encounter: Payer: Self-pay | Admitting: Internal Medicine

## 2017-02-27 VITALS — BP 131/76 | HR 69 | Temp 98.2°F | Ht 65.0 in | Wt 199.1 lb

## 2017-02-27 DIAGNOSIS — R053 Chronic cough: Secondary | ICD-10-CM

## 2017-02-27 DIAGNOSIS — R7303 Prediabetes: Secondary | ICD-10-CM

## 2017-02-27 DIAGNOSIS — R05 Cough: Secondary | ICD-10-CM

## 2017-02-27 DIAGNOSIS — R339 Retention of urine, unspecified: Secondary | ICD-10-CM

## 2017-02-27 DIAGNOSIS — F32A Depression, unspecified: Secondary | ICD-10-CM

## 2017-02-27 DIAGNOSIS — R918 Other nonspecific abnormal finding of lung field: Secondary | ICD-10-CM | POA: Diagnosis not present

## 2017-02-27 DIAGNOSIS — M25561 Pain in right knee: Secondary | ICD-10-CM

## 2017-02-27 DIAGNOSIS — G8929 Other chronic pain: Secondary | ICD-10-CM

## 2017-02-27 DIAGNOSIS — K219 Gastro-esophageal reflux disease without esophagitis: Secondary | ICD-10-CM | POA: Diagnosis not present

## 2017-02-27 DIAGNOSIS — M25562 Pain in left knee: Secondary | ICD-10-CM

## 2017-02-27 DIAGNOSIS — Z0000 Encounter for general adult medical examination without abnormal findings: Secondary | ICD-10-CM

## 2017-02-27 DIAGNOSIS — R3914 Feeling of incomplete bladder emptying: Secondary | ICD-10-CM | POA: Insufficient documentation

## 2017-02-27 DIAGNOSIS — E785 Hyperlipidemia, unspecified: Secondary | ICD-10-CM

## 2017-02-27 DIAGNOSIS — Z79899 Other long term (current) drug therapy: Secondary | ICD-10-CM | POA: Diagnosis not present

## 2017-02-27 DIAGNOSIS — F329 Major depressive disorder, single episode, unspecified: Secondary | ICD-10-CM

## 2017-02-27 DIAGNOSIS — Z23 Encounter for immunization: Secondary | ICD-10-CM

## 2017-02-27 LAB — GLUCOSE, CAPILLARY: GLUCOSE-CAPILLARY: 122 mg/dL — AB (ref 65–99)

## 2017-02-27 LAB — POCT GLYCOSYLATED HEMOGLOBIN (HGB A1C): HEMOGLOBIN A1C: 6.2

## 2017-02-27 MED ORDER — PANTOPRAZOLE SODIUM 40 MG PO TBEC: 40.0000 mg | DELAYED_RELEASE_TABLET | Freq: Every day | ORAL | 1 refills | Status: DC

## 2017-02-27 MED ORDER — CETIRIZINE HCL 10 MG PO CAPS: 10.0000 mg | ORAL_CAPSULE | Freq: Every day | ORAL | 6 refills | Status: DC | PRN

## 2017-02-27 MED ORDER — QUETIAPINE FUMARATE 50 MG PO TABS: 50.0000 mg | ORAL_TABLET | Freq: Every day | ORAL | 0 refills | Status: DC

## 2017-02-27 NOTE — Patient Instructions (Addendum)
-   It was a pleasure seeing you today - Please follow up with me in 1 month - I have refilled your protonix and cetrizine - Please follow up with [facility] for your depression - I will check a urine test and some blood work today - We will also check a chest x ray today

## 2017-02-27 NOTE — Progress Notes (Signed)
   Subjective:    Patient ID: Theresa Kirk, female    DOB: 1961/05/31, 56 y.o.   MRN: [id number]  HPI  I have seen and examined this patient. This patient is here for routine follow up of her depression and GERD.  Patient states that she also has a persistent cough which has been there for months. She states that it is usually dry but over the last week she has noted productive cough with whitish phlegm (occasionally green). No fevers, no chills. She also complains of persistent knee pain and crampy pain over her R foot below her ankle worse on ambulation.  She has no other complaints at this time.  Review of Systems  Constitutional: Negative.   HENT: Negative.   Respiratory: Positive for cough. Negative for chest tightness and wheezing.   Cardiovascular: Negative.   Gastrointestinal: Negative.   Musculoskeletal: Positive for arthralgias. Negative for joint swelling and myalgias.  Skin: Negative.   Neurological: Negative.   Psychiatric/Behavioral: Negative.        Objective:   Physical Exam  Constitutional: She is oriented to person, place, and time. She appears well-developed and well-nourished.  HENT:  Head: Normocephalic and atraumatic.  Mouth/Throat: No oropharyngeal exudate.  Neck: Neck supple.  Cardiovascular: Normal rate, regular rhythm and normal heart sounds.   Pulmonary/Chest: Effort normal and breath sounds normal. No respiratory distress. She has no wheezes.  Abdominal: Soft. Bowel sounds are normal. She exhibits no distension. There is no tenderness.  Musculoskeletal: Normal range of motion. She exhibits no edema.  Lymphadenopathy:    She has no cervical adenopathy.  Neurological: She is alert and oriented to person, place, and time.  Skin: Skin is warm. No rash noted. No erythema.  Psychiatric: She has a normal mood and affect. Her behavior is normal.          Assessment & Plan:  Please see problem based charting for assessment and plan:

## 2017-02-28 ENCOUNTER — Telehealth: Payer: Self-pay | Admitting: Internal Medicine

## 2017-02-28 LAB — BMP8+ANION GAP
Anion Gap: 15 mmol/L (ref 10.0–18.0)
BUN/Creatinine Ratio: 16 (ref 9–23)
BUN: 12 mg/dL (ref 6–24)
CALCIUM: 9.9 mg/dL (ref 8.7–10.2)
CHLORIDE: 101 mmol/L (ref 96–106)
CO2: 22 mmol/L (ref 20–29)
Creatinine, Ser: 0.76 mg/dL (ref 0.57–1.00)
GFR calc non Af Amer: 89 mL/min/{1.73_m2} (ref >59)
GFR, EST AFRICAN AMERICAN: 103 mL/min/{1.73_m2} (ref >59)
GLUCOSE: 99 mg/dL (ref 65–99)
Potassium: 4.3 mmol/L (ref 3.5–5.2)
Sodium: 138 mmol/L (ref 134–144)

## 2017-02-28 LAB — LIPID PANEL
CHOL/HDL RATIO: 5.1 ratio — AB (ref 0.0–4.4)
CHOLESTEROL TOTAL: 253 mg/dL — AB (ref 100–199)
HDL: 50 mg/dL (ref >39)
LDL Calculated: 182 mg/dL — ABNORMAL HIGH (ref 0–99)
TRIGLYCERIDES: 105 mg/dL (ref 0–149)
VLDL Cholesterol Cal: 21 mg/dL (ref 5–40)

## 2017-02-28 LAB — URINALYSIS, ROUTINE W REFLEX MICROSCOPIC
Bilirubin, UA: NEGATIVE
GLUCOSE, UA: NEGATIVE
Ketones, UA: NEGATIVE
Leukocytes, UA: NEGATIVE
NITRITE UA: NEGATIVE
PH UA: 5 (ref 5.0–7.5)
Protein, UA: NEGATIVE
RBC, UA: NEGATIVE
Specific Gravity, UA: 1.015 (ref 1.005–1.030)
Urobilinogen, Ur: 0.2 mg/dL (ref 0.2–1.0)

## 2017-02-28 LAB — HEPATITIS C ANTIBODY: Hep C Virus Ab: 0.2 s/co ratio (ref 0.0–0.9)

## 2017-02-28 NOTE — Assessment & Plan Note (Signed)
-   Patient has not followed up with her psychiatrist at [facility] since her last visit with me - She states she will go to [facility] for further treatment - She has not taken any of her meds in months except for occasional seroquel - Will refill her seroquel for her today and have her follow up with psych for further work up - I have removed clonidine, welbutrin and lexapro from her med list as she says she has not been taking any of them for months

## 2017-02-28 NOTE — Assessment & Plan Note (Addendum)
-   flu shot given today - Last pap smear was done in March this year showed no cervical or intraepithelial lesions  - Hep C Ab was negative

## 2017-02-28 NOTE — Telephone Encounter (Signed)
I called the patient to discuss results of her lab work with her. I explained to her that her kidney function and her urinalysis was normal. We also discussed her chronic cough. She has yet to pick up her Protonix and cetirizine which she will do today. She states that she will try these for month and on her follow-up visit if she has persistent cough we will go ahead and treat her for possible underlying COPD with albuterol as well as possibly a short course of steroids. Chest x-ray shows minimal bronchitic changes and her PFTs showed possible minimal underlying obstructive airway disease. We also discussed her elevated LDL (182) and that her 10 year ASCVD risk score was 4.3%. I explained that this is below the normal threshold for which we would treat with a statin but explained that she did have some risk factors for cardiovascular disease given her LDL was greater than 160 and her mother had a stroke. She was previously on Zocor without any side effects and states that she would like to resume this at this time and understands the risks and benefits. Patient expresses understanding with plan and has no further questions.

## 2017-02-28 NOTE — Assessment & Plan Note (Signed)
-   Patient with persistent cough and has run out of her protonix. - I suspect that some of her symptoms may be related to GERD - We will refill her protonix today

## 2017-02-28 NOTE — Assessment & Plan Note (Signed)
-   Patient has persistent cough on going over the last year. States her cough is now productive of white phlegm which is occasionally green. She denies any fevers. - She has had courses of antihistamines, flonase, PPI - She had a chest X ray done in January which showed no acute pathology - She also had PFTs done in June which showed a normal FEV1 and FEV1/FVC ratio but possible minimal obstructive small airway disease with mild diffusion defect - She is not an active smoker - The etiology of her chronic cough remains uncertain at this time. Repeat CXR done shows mild bronchitic changes. - The differential is possible COPD/asthma v/s GERD v/s post nasal drip - Will refill her PPI and antihistamine - She has no evidence of wheezing and good airway movement on physical exam. I would still consider a course of steroids and albuterol inhaler if this persists on her follow up next month given her PFT results suggesting possible minimal obstructive airway disease

## 2017-02-28 NOTE — Assessment & Plan Note (Signed)
-   Patient complains of persistent knee pain bilaterally likely secondary to OA - She was given Mobic on her last visit which she states did not help much - She has diclofenac gel but reports only minimal relief with this - Would consider knee injections on next visit v/s referral to sports medicine

## 2017-02-28 NOTE — Assessment & Plan Note (Signed)
-   Patient with a history of HLD intermittently compliant with zocor - Patient had lipid panel done which showed LDL of 182 with a HDL of 50 - Patient with a 10 year ASCVD risk score of 4.3.  - On her follow up visit we will determine if she has any other risk factors to warrant restarting her statin

## 2017-02-28 NOTE — Assessment & Plan Note (Signed)
-   Patient has never taken any meds and denies any polyuria or polydipsia  - Repeat A1c done today was 6.2 - Will continue to monitor for now

## 2017-02-28 NOTE — Assessment & Plan Note (Signed)
>>  ASSESSMENT AND PLAN FOR BILATERAL KNEE PAIN WRITTEN ON 02/28/2017  9:58 AM BY Jeffie Mingle, MD  - Patient complains of persistent knee pain bilaterally likely secondary to OA - She was given Mobic  on her last visit which she states did not help much - She has diclofenac  gel but reports only minimal relief with this - Would consider knee injections on next visit v/s referral to sports medicine

## 2017-02-28 NOTE — Assessment & Plan Note (Addendum)
-   Patient states that she has had a feeling of incomplete bladder emptying over the last few months - She has not noted any increased frequency or urgency. No dysuria - Checked BMP today which was wnl - Checked u/a which was wnl as well - If persistent will do a bladder scan at her follow up appointment and if large residual would consider urology referral

## 2017-03-15 ENCOUNTER — Encounter (HOSPITAL_COMMUNITY): Payer: Self-pay

## 2017-03-15 ENCOUNTER — Emergency Department (HOSPITAL_COMMUNITY): Payer: Medicare Other

## 2017-03-15 DIAGNOSIS — Z79899 Other long term (current) drug therapy: Secondary | ICD-10-CM | POA: Diagnosis not present

## 2017-03-15 DIAGNOSIS — Z87891 Personal history of nicotine dependence: Secondary | ICD-10-CM | POA: Insufficient documentation

## 2017-03-15 DIAGNOSIS — J069 Acute upper respiratory infection, unspecified: Secondary | ICD-10-CM | POA: Insufficient documentation

## 2017-03-15 DIAGNOSIS — R05 Cough: Secondary | ICD-10-CM | POA: Diagnosis not present

## 2017-03-15 LAB — COMPREHENSIVE METABOLIC PANEL
ALT: 21 U/L (ref 14–54)
AST: 22 U/L (ref 15–41)
Albumin: 4.1 g/dL (ref 3.5–5.0)
Alkaline Phosphatase: 84 U/L (ref 38–126)
Anion gap: 9 (ref 5–15)
BUN: 10 mg/dL (ref 6–20)
CHLORIDE: 103 mmol/L (ref 101–111)
CO2: 24 mmol/L (ref 22–32)
CREATININE: 0.91 mg/dL (ref 0.44–1.00)
Calcium: 9.1 mg/dL (ref 8.9–10.3)
GFR calc non Af Amer: >60 mL/min (ref >60)
Glucose, Bld: 99 mg/dL (ref 65–99)
POTASSIUM: 3.8 mmol/L (ref 3.5–5.1)
SODIUM: 136 mmol/L (ref 135–145)
Total Bilirubin: 0.3 mg/dL (ref 0.3–1.2)
Total Protein: 7.2 g/dL (ref 6.5–8.1)

## 2017-03-15 LAB — CBC WITH DIFFERENTIAL/PLATELET
Basophils Absolute: 0 10*3/uL (ref 0.0–0.1)
Basophils Relative: 0 %
EOS ABS: 0.4 10*3/uL (ref 0.0–0.7)
Eosinophils Relative: 3 %
HCT: 38.9 % (ref 36.0–46.0)
Hemoglobin: 12.8 g/dL (ref 12.0–15.0)
LYMPHS PCT: 22 %
Lymphs Abs: 3.3 10*3/uL (ref 0.7–4.0)
MCH: 30.3 pg (ref 26.0–34.0)
MCHC: 32.9 g/dL (ref 30.0–36.0)
MCV: 92 fL (ref 78.0–100.0)
MONOS PCT: 8 %
Monocytes Absolute: 1.1 10*3/uL — ABNORMAL HIGH (ref 0.1–1.0)
NEUTROS PCT: 67 %
Neutro Abs: 10.1 10*3/uL — ABNORMAL HIGH (ref 1.7–7.7)
Platelets: 311 10*3/uL (ref 150–400)
RBC: 4.23 MIL/uL (ref 3.87–5.11)
RDW: 13.7 % (ref 11.5–15.5)
WBC: 14.9 10*3/uL — ABNORMAL HIGH (ref 4.0–10.5)

## 2017-03-15 LAB — URINALYSIS, ROUTINE W REFLEX MICROSCOPIC
BILIRUBIN URINE: NEGATIVE
GLUCOSE, UA: NEGATIVE mg/dL
Hgb urine dipstick: NEGATIVE
KETONES UR: NEGATIVE mg/dL
Leukocytes, UA: NEGATIVE
Nitrite: NEGATIVE
PH: 6 (ref 5.0–8.0)
Protein, ur: NEGATIVE mg/dL
Specific Gravity, Urine: 1.019 (ref 1.005–1.030)

## 2017-03-15 LAB — I-STAT CG4 LACTIC ACID, ED: LACTIC ACID, VENOUS: 1.41 mmol/L (ref 0.5–1.9)

## 2017-03-15 NOTE — ED Triage Notes (Addendum)
Pt. Reports feeling that she has had a cold since Friday morning. Pt. Reports her body aches, her stomach is sore. Pt. Reports not knowning if she has had a fever. Pt. Reports feeling cold. Pt. Having productive cough with expiratory wheezing.

## 2017-03-16 ENCOUNTER — Emergency Department (HOSPITAL_COMMUNITY)
Admission: EM | Admit: 2017-03-16 | Discharge: 2017-03-16 | Disposition: A | Discharge status: Home / Self Care | Payer: Medicare Other | Attending: Emergency Medicine | Admitting: Emergency Medicine

## 2017-03-16 DIAGNOSIS — R059 Cough, unspecified: Secondary | ICD-10-CM

## 2017-03-16 DIAGNOSIS — R05 Cough: Secondary | ICD-10-CM

## 2017-03-16 DIAGNOSIS — J069 Acute upper respiratory infection, unspecified: Secondary | ICD-10-CM

## 2017-03-16 MED ORDER — BENZONATATE 100 MG PO CAPS
100.0000 mg | ORAL_CAPSULE | Freq: Once | ORAL | Status: DC
  Filled 2017-03-16: qty 1

## 2017-03-16 MED ORDER — PREDNISONE 20 MG PO TABS
60.0000 mg | ORAL_TABLET | Freq: Once | ORAL | Status: DC
  Filled 2017-03-16: qty 3

## 2017-03-16 MED ORDER — AZITHROMYCIN 250 MG PO TABS
500.0000 mg | ORAL_TABLET | Freq: Once | ORAL | Status: DC
  Filled 2017-03-16: qty 2

## 2017-03-16 MED ORDER — ALBUTEROL SULFATE HFA 108 (90 BASE) MCG/ACT IN AERS: 1.0000 | INHALATION_SPRAY | Freq: Four times a day (QID) | RESPIRATORY_TRACT | 0 refills | Status: DC | PRN

## 2017-03-16 MED ORDER — AZITHROMYCIN 250 MG PO TABS: 250.0000 mg | ORAL_TABLET | Freq: Every day | ORAL | 0 refills | Status: DC

## 2017-03-16 MED ORDER — PREDNISONE 20 MG PO TABS: ORAL_TABLET | ORAL | 0 refills | Status: DC

## 2017-03-16 MED ORDER — BENZONATATE 100 MG PO CAPS: 100.0000 mg | ORAL_CAPSULE | Freq: Three times a day (TID) | ORAL | 0 refills | Status: DC

## 2017-03-16 MED ORDER — ALBUTEROL SULFATE HFA 108 (90 BASE) MCG/ACT IN AERS
2.0000 | INHALATION_SPRAY | Freq: Once | RESPIRATORY_TRACT | Status: AC
  Administered 2017-03-16: 2 via RESPIRATORY_TRACT
  Filled 2017-03-16: qty 6.7

## 2017-03-16 NOTE — Discharge Instructions (Signed)
Take the prescribed medication as directed.  Make sure to rest, drink fluids to stay hydrated. Follow-up with your doctor as scheduled on the 20th. Return to the ED for new or worsening symptoms.

## 2017-03-16 NOTE — ED Provider Notes (Signed)
[facility] DEPT Provider Note   CSN: [id number] Arrival date & time: 03/15/17  2105     History   Chief Complaint No chief complaint on file.   HPI Theresa Kirk is a 56 y.o. female.  The history is provided by the patient and medical records.     56 year old female with history of alcohol abuse, arthritis, carpal tunnel, depression, presenting to the ED with cold symptoms for the past 2 days. She reports she attended a funeral Thursday evening and was caught out in the rain for an extended period of time. States on Friday morning she awoke feeling "terrible". She reports productive cough with green/yellow sputum, body aches, nasal congestion, and chills.  She is unsure of actual fever. States she's been coughing so much that her whole chest is "sore" but she denies any chest pain. She has no known cardiac history. States she has tried multiple over-the-counter medications without relief.  States last season she did get bronchitis quite a few times and it took a prolonged period of time to get it to resolve. States she has an upcoming appointment with her doctor next week to have some pulmonary testing done.  Reports childhood hx of asthma, no problems in recent years.  Does not use inhaler regularly.  No hx of COPD.  Patient is a former smoker.  Past Medical History:  Diagnosis Date  . Alcohol abuse   . Arthritis   . Carpal tunnel syndrome   . Depression   . Fibromyalgia   . High cholesterol     Patient Active Problem List   Diagnosis Date Noted  . Feeling of incomplete bladder emptying 02/27/2017  . Pap smear for cervical cancer screening 08/14/2016  . Migraine without status migrainosus, not intractable 05/21/2016  . Paresthesia of both hands 02/20/2016  . Acute upper respiratory infection 02/20/2016  . Warts 03/28/2015  . Prediabetes 09/29/2014  . Bilateral knee pain 08/18/2014  . Bilateral ocular hypertension 06/27/2014  . GERD (gastroesophageal reflux disease)  05/05/2014  . Abnormal mammogram with microcalcification 10/15/2013  . Chronic cough 12/09/2012  . Seasonal allergies 09/23/2012  . Healthcare maintenance 04/13/2012  . Hyperlipidemia 11/06/2006  . Depression 11/06/2006    Past Surgical History:  Procedure Laterality Date  . ABDOMINAL HYSTERECTOMY  2008   h/o uterine fibroids, ?partial hysterectomy per pt 04/13/12  . COLONOSCOPY    . FOOT SURGERY Right   . KNEE SURGERY      OB History    Gravida Para Term Preterm AB Living   4 3 3  0 1 3   SAB TAB Ectopic Multiple Live Births   0 1 0 0         Home Medications    Prior to Admission medications   Medication Sig Start Date End Date Taking? Authorizing Provider  Cetirizine HCl 10 MG CAPS Take 1 capsule (10 mg total) by mouth daily as needed (allergies). 02/27/17   Aldine Contes, MD  dextromethorphan-guaiFENesin (MUCINEX DM) 30-600 MG 12hr tablet Take 1 tablet by mouth 2 (two) times daily. 08/22/16   Dellia Nims, MD  diclofenac sodium (VOLTAREN) 1 % GEL Apply 4 g topically 4 (four) times daily. 10/03/15   Aldine Contes, MD  fluticasone (FLONASE ALLERGY RELIEF) 50 MCG/ACT nasal spray Place 2 sprays into both nostrils daily. 06/25/16   Maryellen Pile, MD  pantoprazole (PROTONIX) 40 MG tablet Take 1 tablet (40 mg total) by mouth daily. 02/27/17   Aldine Contes, MD  QUEtiapine (SEROQUEL) 50 MG tablet Take 1  tablet (50 mg total) by mouth at bedtime. 02/27/17   Aldine Contes, MD  simvastatin (ZOCOR) 40 MG tablet Take 1 tablet (40 mg total) by mouth at bedtime. 05/21/16   Aldine Contes, MD    Family History Family History  Problem Relation Age of Onset  . Diabetes Mother   . Hypertension Mother   . Stroke Mother   . Diabetes Sister   . Thyroid disease Sister   . Hypertension Sister   . Cancer Maternal Aunt        unknown  . Breast cancer Maternal Aunt     Social History Social History  Substance Use Topics  . Smoking status: Former Smoker    Quit date:  06/03/2000  . Smokeless tobacco: Never Used  . Alcohol use 0.0 oz/week     Comment: Sometimes.     Allergies   Iohexol   Review of Systems Review of Systems  Constitutional: Positive for chills.  HENT: Positive for congestion.   Respiratory: Positive for cough and wheezing.   All other systems reviewed and are negative.    Physical Exam Updated Vital Signs BP (!) 130/104 (BP Location: Right Arm)   Pulse 92   Temp 99.6 F (37.6 C) (Oral)   Resp 16   Ht 5\' 5"  (1.651 m)   Wt 90.7 kg (200 lb)   LMP 09/14/2006   SpO2 100%   BMI 33.28 kg/m   Physical Exam  Constitutional: She is oriented to person, place, and time. She appears well-developed and well-nourished.  HENT:  Head: Normocephalic and atraumatic.  Right Ear: Tympanic membrane and ear canal normal.  Left Ear: Tympanic membrane and ear canal normal.  Nose: Mucosal edema and rhinorrhea present.  Mouth/Throat: Uvula is midline, oropharynx is clear and moist and mucous membranes are normal. No oropharyngeal exudate, posterior oropharyngeal edema, posterior oropharyngeal erythema or tonsillar abscesses.  Eyes: Pupils are equal, round, and reactive to light. Conjunctivae and EOM are normal.  Neck: Normal range of motion.  Cardiovascular: Normal rate, regular rhythm and normal heart sounds.   Pulmonary/Chest: Effort normal. She has rhonchi. She has no rales.  Breath sounds junky in upper lobes, no distress, able to speak in full sentences without difficulty  Abdominal: Soft. Bowel sounds are normal.  Musculoskeletal: Normal range of motion.  Neurological: She is alert and oriented to person, place, and time.  Skin: Skin is warm and dry.  Psychiatric: She has a normal mood and affect.  Nursing note and vitals reviewed.    ED Treatments / Results  Labs (all labs ordered are listed, but only abnormal results are displayed) Labs Reviewed  CBC WITH DIFFERENTIAL/PLATELET - Abnormal; Notable for the following:        Result Value   WBC 14.9 (*)    Neutro Abs 10.1 (*)    Monocytes Absolute 1.1 (*)    All other components within normal limits  COMPREHENSIVE METABOLIC PANEL  URINALYSIS, ROUTINE W REFLEX MICROSCOPIC  I-STAT CG4 LACTIC ACID, ED    EKG  EKG Interpretation None       Radiology Dg Chest 2 View  Result Date: 03/15/2017 CLINICAL DATA:  Body aches and abdominal discomfort. Productive cough. EXAM: CHEST  2 VIEW COMPARISON:  02/27/2017 FINDINGS: Atelectatic appearing opacity in the lingula. Right lung is clear. No pleural effusions. Normal heart size. Unremarkable hilar and mediastinal contours. Normal pulmonary vasculature. IMPRESSION: Linear lingular opacity, likely atelectatic. No confluent consolidation. No effusion. Electronically Signed   By: Valerie Roys.D.  On: 03/15/2017 21:45    Procedures Procedures (including critical care time)  Medications Ordered in ED Medications  predniSONE (DELTASONE) tablet 60 mg (20 mg Oral Incomplete 03/16/17 0104)  azithromycin (ZITHROMAX) tablet 500 mg (500 mg Oral Incomplete 03/16/17 0104)  benzonatate (TESSALON) capsule 100 mg (100 mg Oral Incomplete 03/16/17 0104)  albuterol (PROVENTIL HFA;VENTOLIN HFA) 108 (90 Base) MCG/ACT inhaler 2 puff (2 puffs Inhalation Given 03/16/17 0104)     Initial Impression / Assessment and Plan / ED Course  I have reviewed the triage vital signs and the nursing notes.  Pertinent labs & imaging results that were available during my care of the patient were reviewed by me and considered in my medical decision making (see chart for details).  56 year old female here with URI type symptoms for the past 2 days after being caught in the rain at a funeral.  She reports productive cough, congestion, chills.  She is afebrile, non-toxic in appearance here.  Lung sounds are junky in upper lobes.  Slight wheezing noted but no distress.  VSS.  Screening labs overall reassuring, leukocytosis noted but lactate WNL. CXR  with ligular opacity-- CAP vs atelectasis.  Given her symptoms and elevated WBC count, will treat for CAP.  Start azithromycin, prednisone taper, tessalon, and albuterol PRN.  Has follow-up scheduled with her PCP next week.  Discussed plan with patient, she acknowledged understanding and agreed with plan of care.  Return precautions given for new or worsening symptoms.  Final Clinical Impressions(s) / ED Diagnoses   Final diagnoses:  Upper respiratory tract infection, unspecified type  Cough    New Prescriptions New Prescriptions   ALBUTEROL (PROVENTIL HFA;VENTOLIN HFA) 108 (90 BASE) MCG/ACT INHALER    Inhale 1-2 puffs into the lungs every 6 (six) hours as needed for wheezing.   AZITHROMYCIN (ZITHROMAX) 250 MG TABLET    Take 1 tablet (250 mg total) by mouth daily. Take first 2 tablets together, then 1 every day until finished.   BENZONATATE (TESSALON) 100 MG CAPSULE    Take 1 capsule (100 mg total) by mouth every 8 (eight) hours.   PREDNISONE (DELTASONE) 20 MG TABLET    Take 40 mg by mouth daily for 3 days, then 20mg  by mouth daily for 3 days, then 10mg  daily for 3 days     Larene Pickett, PA-C 03/16/17 0154    Ward, Delice Bison, DO 03/16/17 0300

## 2017-03-25 ENCOUNTER — Ambulatory Visit (INDEPENDENT_AMBULATORY_CARE_PROVIDER_SITE_OTHER): Payer: Medicare Other | Admitting: Internal Medicine

## 2017-03-25 ENCOUNTER — Encounter: Payer: Self-pay | Admitting: Internal Medicine

## 2017-03-25 VITALS — BP 108/71 | HR 79 | Temp 98.1°F | Ht 65.0 in | Wt 197.5 lb

## 2017-03-25 DIAGNOSIS — R3914 Feeling of incomplete bladder emptying: Secondary | ICD-10-CM | POA: Diagnosis not present

## 2017-03-25 DIAGNOSIS — R39198 Other difficulties with micturition: Secondary | ICD-10-CM | POA: Diagnosis not present

## 2017-03-25 DIAGNOSIS — R053 Chronic cough: Secondary | ICD-10-CM

## 2017-03-25 DIAGNOSIS — Z7951 Long term (current) use of inhaled steroids: Secondary | ICD-10-CM

## 2017-03-25 DIAGNOSIS — R05 Cough: Secondary | ICD-10-CM

## 2017-03-25 DIAGNOSIS — Z79899 Other long term (current) drug therapy: Secondary | ICD-10-CM

## 2017-03-25 DIAGNOSIS — E785 Hyperlipidemia, unspecified: Secondary | ICD-10-CM

## 2017-03-25 DIAGNOSIS — F329 Major depressive disorder, single episode, unspecified: Secondary | ICD-10-CM

## 2017-03-25 DIAGNOSIS — R350 Frequency of micturition: Secondary | ICD-10-CM | POA: Diagnosis not present

## 2017-03-25 DIAGNOSIS — R942 Abnormal results of pulmonary function studies: Secondary | ICD-10-CM

## 2017-03-25 DIAGNOSIS — F339 Major depressive disorder, recurrent, unspecified: Secondary | ICD-10-CM

## 2017-03-25 DIAGNOSIS — F32A Depression, unspecified: Secondary | ICD-10-CM

## 2017-03-25 LAB — POCT URINALYSIS DIPSTICK
Bilirubin, UA: NEGATIVE
GLUCOSE UA: NEGATIVE
Ketones, UA: NEGATIVE
LEUKOCYTES UA: NEGATIVE
Nitrite, UA: NEGATIVE
PROTEIN UA: NEGATIVE
Spec Grav, UA: 1.02 (ref 1.010–1.025)
UROBILINOGEN UA: 0.2 U/dL
pH, UA: 5.5 (ref 5.0–8.0)

## 2017-03-25 MED ORDER — CLONIDINE HCL 0.2 MG PO TABS: 0.2000 mg | ORAL_TABLET | Freq: Two times a day (BID) | ORAL | 0 refills | Status: DC

## 2017-03-25 MED ORDER — ARIPIPRAZOLE 10 MG PO TABS: 10.0000 mg | ORAL_TABLET | Freq: Every day | ORAL | 0 refills | Status: DC

## 2017-03-25 MED ORDER — BUPROPION HCL 75 MG PO TABS: 75.0000 mg | ORAL_TABLET | Freq: Every day | ORAL | 0 refills | Status: DC

## 2017-03-25 MED ORDER — DIVALPROEX SODIUM 500 MG PO DR TAB: 500.0000 mg | DELAYED_RELEASE_TABLET | Freq: Two times a day (BID) | ORAL | 0 refills | Status: DC

## 2017-03-25 NOTE — Assessment & Plan Note (Signed)
-   Patient has had chronic cough over the last year - She had a CXR done last month and in January which showed no clear etiology for her symptoms. She was recently seen in the ED this month and had another x ray done which showed some atelectatic changes. - She was recently treated by the ED with a course of azithromycin as well as prednisone and an albuterol inhaler - She states the albuterol inhaler helped her cough. - She did have PFTs with possible mild obstructive small airway disease - Will continue with albuterol inhaler prn for now and consider adding spiriva if cough persists.

## 2017-03-25 NOTE — Progress Notes (Signed)
   Subjective:    Patient ID: Theresa Kirk, female    DOB: 20-Oct-1960, 56 y.o.   MRN: [id number]  HPI  I have seen and examined this patient. She is here for routine follow up of her HLD and chronic cough.  Patient has had a persistent cough over the last year. She recently went to the ED on 10/14 and was treated for a possible PNA with azithromycin as well as given a course of steroids and an albuterol inhaler.  Patient still complains of frequent urination and a feeling of incomplete emptying of her bladder. She denies any fevers, no abd pain, no dysuria. Patient states this has been a chronic issue over the last couple of months.  She denies any other complaints at this time and is compliant with her medication  Review of Systems  Constitutional: Negative.   HENT: Negative.  Negative for sore throat, trouble swallowing and voice change.   Respiratory: Positive for cough. Negative for chest tightness and shortness of breath.   Cardiovascular: Negative.   Gastrointestinal: Negative.   Genitourinary: Positive for difficulty urinating and frequency. Negative for dysuria.  Musculoskeletal: Negative.   Skin: Negative.   Neurological: Negative.   Psychiatric/Behavioral: Negative.        Objective:   Physical Exam  Constitutional: She is oriented to person, place, and time. She appears well-developed and well-nourished.  HENT:  Head: Normocephalic and atraumatic.  Cardiovascular: Normal rate, regular rhythm and normal heart sounds.   Pulmonary/Chest: Effort normal and breath sounds normal.  Abdominal: Soft. There is no tenderness.  Musculoskeletal: Normal range of motion. She exhibits no edema.  Neurological: She is alert and oriented to person, place, and time.  Skin: Skin is warm. No rash noted.  Psychiatric: She has a normal mood and affect. Her behavior is normal.          Assessment & Plan:  Please see problem based charting for assessment and plan:

## 2017-03-25 NOTE — Assessment & Plan Note (Signed)
-   Patient has a history of depression but had stopped following with her therapist at her last visit - She was encouraged to start following at [facility] on her last visit - She has since been seen at [facility]. Her seroquel was stopped and she was started on depakote 500 mg, clonidine 0.2 mg, bupropion 75 mg and abilify 10 mg - She was encouraged to continue to follow with them. She did complain of some drowsiness with her current medications and was advised to call [facility] and be careful while driving

## 2017-03-25 NOTE — Assessment & Plan Note (Signed)
-   Patient has a history of mild HLD and has been on Zocor 40 mg - She stopped taking this recently - Her 10 year ASCVD risk score is only 4.3 based on her recent lipid panel - I will stop this medication for now and reassess her with a lipid panel next year

## 2017-03-25 NOTE — Patient Instructions (Signed)
-   It was a pleasure seeing you today - I have put in a referral to urology for you for the bladder symptoms you are having - Please follow up in 3 months - I have stopped your cholesterol medication. We will recheck it next year and reassess if you need to restart them - Continue with your albuterol inhaler as needed for cough. We may need to add a longer acting medication if cough persists - Have a great holiday season!!

## 2017-03-25 NOTE — Assessment & Plan Note (Signed)
-   Patient complains of increased frequency and a feeling of incomplete empyting of the bladder over the last few months - No fevers/chills, no dysuria, no abd/flank pain. - Urine dipstick with no evidence of UTI - I suspect that she may have a minor bladder prolapse that is causing her symptoms - I will refer her to urology for follow up

## 2017-05-20 DIAGNOSIS — R3914 Feeling of incomplete bladder emptying: Secondary | ICD-10-CM | POA: Diagnosis not present

## 2017-05-20 DIAGNOSIS — R3911 Hesitancy of micturition: Secondary | ICD-10-CM | POA: Diagnosis not present

## 2017-05-20 DIAGNOSIS — R3915 Urgency of urination: Secondary | ICD-10-CM | POA: Diagnosis not present

## 2017-06-12 ENCOUNTER — Other Ambulatory Visit: Payer: Self-pay | Admitting: *Deleted

## 2017-06-12 ENCOUNTER — Other Ambulatory Visit: Payer: Self-pay

## 2017-06-12 DIAGNOSIS — K219 Gastro-esophageal reflux disease without esophagitis: Secondary | ICD-10-CM

## 2017-06-12 MED ORDER — PANTOPRAZOLE SODIUM 40 MG PO TBEC: 40.0000 mg | DELAYED_RELEASE_TABLET | Freq: Every day | ORAL | 1 refills | Status: DC

## 2017-06-12 NOTE — Telephone Encounter (Signed)
A user error has taken place: encounter opened in error, closed for administrative reasons.

## 2017-06-12 NOTE — Telephone Encounter (Signed)
pantoprazole (PROTONIX) 40 MG tablet, Refill request @ [facility] on [address].

## 2017-07-29 ENCOUNTER — Encounter: Payer: Self-pay | Admitting: Internal Medicine

## 2017-07-29 ENCOUNTER — Ambulatory Visit (INDEPENDENT_AMBULATORY_CARE_PROVIDER_SITE_OTHER): Payer: Medicare Other | Admitting: Internal Medicine

## 2017-07-29 ENCOUNTER — Other Ambulatory Visit: Payer: Self-pay

## 2017-07-29 VITALS — BP 103/65 | HR 80 | Temp 97.7°F | Ht 65.0 in | Wt 206.8 lb

## 2017-07-29 DIAGNOSIS — H538 Other visual disturbances: Secondary | ICD-10-CM

## 2017-07-29 DIAGNOSIS — M545 Low back pain, unspecified: Secondary | ICD-10-CM

## 2017-07-29 DIAGNOSIS — Z79899 Other long term (current) drug therapy: Secondary | ICD-10-CM

## 2017-07-29 DIAGNOSIS — M549 Dorsalgia, unspecified: Secondary | ICD-10-CM | POA: Insufficient documentation

## 2017-07-29 DIAGNOSIS — R3914 Feeling of incomplete bladder emptying: Secondary | ICD-10-CM

## 2017-07-29 DIAGNOSIS — R7303 Prediabetes: Secondary | ICD-10-CM | POA: Diagnosis not present

## 2017-07-29 DIAGNOSIS — Z791 Long term (current) use of non-steroidal anti-inflammatories (NSAID): Secondary | ICD-10-CM | POA: Diagnosis not present

## 2017-07-29 DIAGNOSIS — R05 Cough: Secondary | ICD-10-CM

## 2017-07-29 DIAGNOSIS — F339 Major depressive disorder, recurrent, unspecified: Secondary | ICD-10-CM

## 2017-07-29 DIAGNOSIS — M7918 Myalgia, other site: Secondary | ICD-10-CM

## 2017-07-29 DIAGNOSIS — R053 Chronic cough: Secondary | ICD-10-CM

## 2017-07-29 DIAGNOSIS — E785 Hyperlipidemia, unspecified: Secondary | ICD-10-CM

## 2017-07-29 DIAGNOSIS — F329 Major depressive disorder, single episode, unspecified: Secondary | ICD-10-CM

## 2017-07-29 DIAGNOSIS — F32A Depression, unspecified: Secondary | ICD-10-CM

## 2017-07-29 DIAGNOSIS — K219 Gastro-esophageal reflux disease without esophagitis: Secondary | ICD-10-CM | POA: Diagnosis not present

## 2017-07-29 LAB — GLUCOSE, CAPILLARY: Glucose-Capillary: 91 mg/dL (ref 65–99)

## 2017-07-29 LAB — POCT GLYCOSYLATED HEMOGLOBIN (HGB A1C): Hemoglobin A1C: 6.1

## 2017-07-29 MED ORDER — TRAZODONE HCL 100 MG PO TABS: 100.0000 mg | ORAL_TABLET | Freq: Every evening | ORAL | 0 refills | Status: DC | PRN

## 2017-07-29 MED ORDER — MELOXICAM 7.5 MG PO TABS: 7.5000 mg | ORAL_TABLET | Freq: Every day | ORAL | 0 refills | Status: DC

## 2017-07-29 NOTE — Assessment & Plan Note (Addendum)
-  This problem is chronic and stable -Patient denies any polyuria or polydipsia -We will recheck her A1c today -No further workup at this time

## 2017-07-29 NOTE — Assessment & Plan Note (Signed)
-  This problem is chronic and stable -Patient states that her reflux symptoms have been well controlled -We will continue with Protonix 40 mg daily for now

## 2017-07-29 NOTE — Assessment & Plan Note (Signed)
-  This problem is chronic and stable -Last year her 10-year ASCVD risk score was only 4.3% -She is not on a statin at this time -We will recheck her lipid panel today

## 2017-07-29 NOTE — Assessment & Plan Note (Signed)
-  This problem is much improved -Patient states that her symptoms are well controlled since she has been taking her Protonix regularly -We will continue to monitor for now

## 2017-07-29 NOTE — Assessment & Plan Note (Addendum)
-  Patient states that she has had intermittent back pain over the last 2 months -Patient states that the pain is worse on standing and relieved on sitting or lying down -Back pain is described as sharp, no radiation, in the lower back, impaired ability of patient to do housework.   -No red flag symptoms noted -On exam no focal tenderness noted to palpation -Patient also complains of associated pain in her trapezius radiating to the right side of her neck which is intermittent in nature and described as a tightness -We will prescribe meloxicam 7.5 mg once daily for likely musculoskeletal pain -We will also refer the patient to physical therapy for further workup -If the pain persists consider referral to sports medicine  Addendum: - Patient noted to have an increased creatinine of 1.2 on BMP.  Patient was asked to stop taking Mobic and other NSAIDs and instead use Tylenol as needed for pain.  Patient also states that she has not been drinking enough water at home.  Encouraged oral hydration.  Patient will need to be followed up in the clinic in a week or 2 for repeat BMP as well as reevaluation of her back pain.  Patient expresses understanding and is in agreement with plan.

## 2017-07-29 NOTE — Assessment & Plan Note (Signed)
-  This problem is chronic and stable -Patient states that she still has a feeling that her bladder is not to be emptying -She has followed up with urology for this (does not remember the name of her urologist). -She will schedule follow-up with them for further workup -She denies any dysuria or increased frequency at this time

## 2017-07-29 NOTE — Assessment & Plan Note (Signed)
-  This problem is chronic and stable -Patient does follow-up with psychiatry for this -Patient is on aripiprazole, clonidine, Depakote and bupropion for this -She also takes trazodone as needed for insomnia -She will follow-up with psychiatry for this

## 2017-07-29 NOTE — Patient Instructions (Addendum)
-  It was a pleasure seeing you today -We will prescribe Mobic for your neck and back pain -I will also refer you to physical therapy -We will check some blood work today -Please continue to follow-up with your psychiatrist for depression -I have also referred you to the eye doctor for your blurry vision and eye pain -Please call me with any questions

## 2017-07-29 NOTE — Assessment & Plan Note (Signed)
-  Patient does state that she has intermittent blurry vision as well as intermittent pain in both eyes which she describes as a shooting pain -On exam her pupils are round and equally reactive to light -Her vision currently is not blurry -We will refer to ophthalmology for further workup

## 2017-07-29 NOTE — Progress Notes (Signed)
   Subjective:    Patient ID: Theresa Kirk, female    DOB: [birth year], 57 y.o.   MRN: [id number]  HPI  I have seen and examined this patient.  Patient is here for routine follow-up of her prediabetes and GERD.  Patient complains of intermittent back pain over the last couple of months which is in her lower back, nonradiating, not associated with any fevers chills, no weight loss, no tingling or numbness in her hands or feet, no focal weakness, no bowel or bladder incontinence.  Patient states the pain worsens on standing but is relieved on sitting.  She also complains of pain over her right trapezius extending into the right side of her neck which is intermittent and is described as a tightness which is also been intermittent over the last couple of months.  Patient denies any other acute complaints at this time and states that she is compliant with her medications.   Review of Systems  Constitutional: Negative.   HENT: Negative.   Respiratory: Negative.   Cardiovascular: Negative.   Gastrointestinal: Negative.   Musculoskeletal: Positive for back pain and myalgias.  Neurological: Negative.   Hematological: Negative.   Psychiatric/Behavioral: Negative.        Objective:   Physical Exam  Constitutional: She is oriented to person, place, and time. She appears well-developed and well-nourished.  HENT:  Head: Normocephalic and atraumatic.  Mouth/Throat: No oropharyngeal exudate.  Eyes: Pupils are equal, round, and reactive to light. Right eye exhibits no discharge. Left eye exhibits no discharge.  Neck: Neck supple.  Cardiovascular: Normal rate, regular rhythm and normal heart sounds.  No murmur heard. Pulmonary/Chest: Effort normal and breath sounds normal. No respiratory distress. She has no wheezes. She has no rales.  Abdominal: Soft. Bowel sounds are normal. She exhibits no distension. There is no tenderness.  Musculoskeletal: Normal range of motion. She exhibits no edema.    Patient with no tenderness on palpation of her back or neck.  Lymphadenopathy:    She has no cervical adenopathy.  Neurological: She is alert and oriented to person, place, and time.  Skin: Skin is warm. No erythema.  Psychiatric: She has a normal mood and affect. Her behavior is normal.          Assessment & Plan:  Please see problem based charting for assessment and plan:

## 2017-07-30 ENCOUNTER — Telehealth: Payer: Self-pay | Admitting: Internal Medicine

## 2017-07-30 LAB — BMP8+ANION GAP
ANION GAP: 18 mmol/L (ref 10.0–18.0)
BUN / CREAT RATIO: 13 (ref 9–23)
BUN: 16 mg/dL (ref 6–24)
CHLORIDE: 103 mmol/L (ref 96–106)
CO2: 20 mmol/L (ref 20–29)
Calcium: 9.4 mg/dL (ref 8.7–10.2)
Creatinine, Ser: 1.27 mg/dL — ABNORMAL HIGH (ref 0.57–1.00)
GFR calc Af Amer: 55 mL/min/{1.73_m2} — ABNORMAL LOW (ref >59)
GFR calc non Af Amer: 48 mL/min/{1.73_m2} — ABNORMAL LOW (ref >59)
GLUCOSE: 81 mg/dL (ref 65–99)
Potassium: 4.6 mmol/L (ref 3.5–5.2)
Sodium: 141 mmol/L (ref 134–144)

## 2017-07-30 LAB — LIPID PANEL
CHOLESTEROL TOTAL: 240 mg/dL — AB (ref 100–199)
Chol/HDL Ratio: 5 ratio — ABNORMAL HIGH (ref 0.0–4.4)
HDL: 48 mg/dL (ref >39)
LDL Calculated: 168 mg/dL — ABNORMAL HIGH (ref 0–99)
TRIGLYCERIDES: 118 mg/dL (ref 0–149)
VLDL CHOLESTEROL CAL: 24 mg/dL (ref 5–40)

## 2017-07-30 NOTE — Telephone Encounter (Signed)
I called patient to discuss results of her blood work with her.  Patient is noted to have an elevated creatinine of 1.2 which is increased from a normal creatinine of 0.9 back in October.  Given the history of patient's recent NSAID use for her back and neck pain I am concerned that this may the etiology versus a prerenal etiology secondary to poor oral hydration.  I encouraged the patient to improve her oral hydration and to refrain from NSAID use including Mobic and Aleve and use Tylenol as needed instead for pain.  Patient will need to be followed up in 1-2 weeks for repeat BMP as well as reevaluation of her back pain.  Patient expresses understanding and is in agreement with plan  Patient also had a lipid panel done which showed an improvement in her LDL to 160s from prior 180s.  Her 10-year ASCVD risk score was 2.1%.  Patient was advised to have a healthier diet and to exercise.  We will recheck her lipid panel next year.  Patient expresses understanding and is in agreement with plan.

## 2017-08-07 ENCOUNTER — Ambulatory Visit (INDEPENDENT_AMBULATORY_CARE_PROVIDER_SITE_OTHER): Payer: Medicare Other | Admitting: Internal Medicine

## 2017-08-07 VITALS — BP 136/74 | HR 71 | Temp 98.1°F | Ht 65.0 in | Wt 207.0 lb

## 2017-08-07 DIAGNOSIS — R7989 Other specified abnormal findings of blood chemistry: Secondary | ICD-10-CM

## 2017-08-07 DIAGNOSIS — G8929 Other chronic pain: Secondary | ICD-10-CM

## 2017-08-07 DIAGNOSIS — M545 Low back pain: Secondary | ICD-10-CM | POA: Diagnosis not present

## 2017-08-07 DIAGNOSIS — N179 Acute kidney failure, unspecified: Secondary | ICD-10-CM | POA: Diagnosis not present

## 2017-08-07 DIAGNOSIS — M47816 Spondylosis without myelopathy or radiculopathy, lumbar region: Secondary | ICD-10-CM

## 2017-08-07 NOTE — Progress Notes (Signed)
   CC: Elevated creatinine and back pain.  HPI:  Theresa Kirk is a 57 y.o. who presented to the clinic for continued evaluation and management of her lumbar back pain. In addition on recent labs her creatinine was elevated from 0.9 to 1.2. For a detailed evaluation and management plan please refer to problem-based charting below.  Past Medical History:  Diagnosis Date  . Alcohol abuse   . Arthritis   . Carpal tunnel syndrome   . Depression   . Fibromyalgia   . High cholesterol    Review of Systems:   12 point ROS preformed. All negative aside from those mentioned in the HPI.  Physical Exam: Vitals:   08/07/17 0925  BP: 136/74  Pulse: 71  Temp: 98.1 F (36.7 C)  TempSrc: Oral  SpO2: 100%  Weight: 207 lb (93.9 kg)  Height: 5\' 5"  (1.651 m)   General: Obese female in no acute distress HENT: Normocephalic, atraumatic, moist mucus membranes Pulm: Good air movement with no wheezing or crackles  CV: RRR, no murmurs, no rubs  Abdomen: Active bowel sounds, soft, non-distended, no tenderness to palpation  Back: No point tenderness to palpation of the lumbar spine. Extremities: Pulses palpable in all extremities, no LE edema  Skin: Warm and dry  Neuro: Alert and oriented x 3. Gross motor 5/5 in the LE bilaterally and sensation to light touch intact bilaterally   Assessment & Plan:   See Encounters Tab for problem based charting.  Patient discussed with Dr. Evette Doffing

## 2017-08-07 NOTE — Assessment & Plan Note (Signed)
Patient presents with acute on chronic back pain. She states that she typically has lumbar back pain but over the past one month it has been worse. She does not remember any trauma or inciting factor. The pain primarily occurs with prolonged standing or hunching and resolves with rest or lying down. She was previously prescribed Mobic and referred to physical therapy. She has an appointment with physical therapy tomorrow and never started the Mobic due to her elevated creatinine. She denies any muscle or sensory changes in her lower extremities, radiation of the pain down her legs, pain that wakes her at night, changes in bowel or bladder function, fevers or weight loss. She does not have past medical history of malignancy. Her symptoms are not limiting her day-to-day activities or functional level.  On physical exam there is no point tenderness over the spinous processes or paraspinal muscles. Gross strength is 5/5 and sensation to light touch is intact in the bilateral lower extremities.  Based on the patient signs and symptoms, her lower back pain is likely of musculoskeletal etiology. More specifically she is likely suffering from lumbar osteoarthritis. Her symptoms are not severely limiting her day-to-day function. She does not have any red flag symptoms to warrant imaging at this point. We discussed trying physical therapy before moving to a more aggressive options. We discussed staying active and the importance of weight loss to reduce lumbar back pain. Patient agrees with the treatment plan and will follow-up in four weeks to see how physical therapy is helping her back.  Plan: - PT - Repeat BMP today  - Follow-up in 4 weeks

## 2017-08-07 NOTE — Addendum Note (Signed)
Addended by: Ina Homes T on: 08/07/2017 11:09 AM   Modules accepted: Level of Service

## 2017-08-07 NOTE — Assessment & Plan Note (Signed)
She was previously evaluated on 2/26 with a BMP obtained at that visit. Creatinine returned at 1.2 which was elevated from a baseline of 0.9. Patient was instructed to stay hydrated, avoid NSAIDs, and return in 1 to 2 weeks for repeat labs. Today she is feeling well and denies uremic symptoms. We will repeat her BMP today.

## 2017-08-07 NOTE — Patient Instructions (Signed)
Thank you for allowing [place] to provide your care. Please see PT and come back in 4 weeks. I will call you with the results of your labs.  Back Pain, Adult Back pain is very common. The pain often gets better over time. The cause of back pain is usually not dangerous. Most people can learn to manage their back pain on their own. Follow these instructions at home: Watch your back pain for any changes. The following actions may help to lessen any pain you are feeling:  Stay active. Start with short walks on flat ground if you can. Try to walk farther each day.  Exercise regularly as told by your doctor. Exercise helps your back heal faster. It also helps avoid future injury by keeping your muscles strong and flexible.  Do not sit, drive, or stand in one place for more than 30 minutes.  Do not stay in bed. Resting more than 1-2 days can slow down your recovery.  Be careful when you bend or lift an object. Use good form when lifting: ? Bend at your knees. ? Keep the object close to your body. ? Do not twist.  Sleep on a firm mattress. Lie on your side, and bend your knees. If you lie on your back, put a pillow under your knees.  Take medicines only as told by your doctor.  Put ice on the injured area. ? Put ice in a plastic bag. ? Place a towel between your skin and the bag. ? Leave the ice on for 20 minutes, 2-3 times a day for the first 2-3 days. After that, you can switch between ice and heat packs.  Avoid feeling anxious or stressed. Find good ways to deal with stress, such as exercise.  Maintain a healthy weight. Extra weight puts stress on your back.  Contact a doctor if:  You have pain that does not go away with rest or medicine.  You have worsening pain that goes down into your legs or buttocks.  You have pain that does not get better in one week.  You have pain at night.  You lose weight.  You have a fever or chills. Get help right away if:  You cannot control when  you poop (bowel movement) or pee (urinate).  Your arms or legs feel weak.  Your arms or legs lose feeling (numbness).  You feel sick to your stomach (nauseous) or throw up (vomit).  You have belly (abdominal) pain.  You feel like you may pass out (faint). This information is not intended to replace advice given to you by your health care provider. Make sure you discuss any questions you have with your health care provider. Document Released: 11/06/2007 Document Revised: 10/26/2015 Document Reviewed: 09/21/2013 Elsevier Interactive Patient Education  [employer].

## 2017-08-08 ENCOUNTER — Encounter: Payer: Self-pay | Admitting: Physical Therapy

## 2017-08-08 ENCOUNTER — Ambulatory Visit: Payer: Medicare Other | Attending: Internal Medicine | Admitting: Physical Therapy

## 2017-08-08 ENCOUNTER — Other Ambulatory Visit: Payer: Self-pay

## 2017-08-08 DIAGNOSIS — M545 Low back pain, unspecified: Secondary | ICD-10-CM

## 2017-08-08 DIAGNOSIS — G8929 Other chronic pain: Secondary | ICD-10-CM | POA: Insufficient documentation

## 2017-08-08 DIAGNOSIS — R2689 Other abnormalities of gait and mobility: Secondary | ICD-10-CM | POA: Insufficient documentation

## 2017-08-08 LAB — BMP8+ANION GAP
Anion Gap: 14 mmol/L (ref 10.0–18.0)
BUN / CREAT RATIO: 15 (ref 9–23)
BUN: 11 mg/dL (ref 6–24)
CALCIUM: 9.7 mg/dL (ref 8.7–10.2)
CO2: 22 mmol/L (ref 20–29)
Chloride: 103 mmol/L (ref 96–106)
Creatinine, Ser: 0.71 mg/dL (ref 0.57–1.00)
GFR calc non Af Amer: 96 mL/min/{1.73_m2} (ref >59)
GFR, EST AFRICAN AMERICAN: 111 mL/min/{1.73_m2} (ref >59)
GLUCOSE: 99 mg/dL (ref 65–99)
POTASSIUM: 4.3 mmol/L (ref 3.5–5.2)
SODIUM: 139 mmol/L (ref 134–144)

## 2017-08-08 NOTE — Progress Notes (Signed)
Internal Medicine Clinic Attending  Case discussed with Dr. Helberg at the time of the visit.  We reviewed the resident's history and exam and pertinent patient test results.  I agree with the assessment, diagnosis, and plan of care documented in the resident's note.    

## 2017-08-11 DIAGNOSIS — H40013 Open angle with borderline findings, low risk, bilateral: Secondary | ICD-10-CM | POA: Diagnosis not present

## 2017-08-11 DIAGNOSIS — H04123 Dry eye syndrome of bilateral lacrimal glands: Secondary | ICD-10-CM | POA: Diagnosis not present

## 2017-08-11 NOTE — Therapy (Signed)
[place], [place], [address] Phone: [phone]   Fax:  [phone]  Physical Therapy Evaluation  Patient Details  Name: Theresa Kirk MRN: [id number] Date of Birth: 01-27-1961 Referring Provider: Dr Aldine Contes    Encounter Date: 08/08/2017  PT End of Session - 08/11/17 0952    Visit Number  1    Number of Visits  12    Date for PT Re-Evaluation  09/22/17    Authorization Type  [employer] MCR     PT Start Time  0845    PT Stop Time  0929    PT Time Calculation (min)  44 min    Activity Tolerance  Patient tolerated treatment well    Behavior During Therapy  [facility] for tasks assessed/performed       Past Medical History:  Diagnosis Date  . Alcohol abuse   . Arthritis   . Carpal tunnel syndrome   . Depression   . Fibromyalgia   . High cholesterol     Past Surgical History:  Procedure Laterality Date  . ABDOMINAL HYSTERECTOMY  2008   h/o uterine fibroids, ?partial hysterectomy per pt 04/13/12  . COLONOSCOPY    . FOOT SURGERY Right   . KNEE SURGERY      There were no vitals filed for this visit.   Subjective Assessment - 08/11/17 0945    Subjective  Patient has had lower backpain for severl years. It has gotten to the point now where it is hurting when she stands to do ADL's and IADL's. The pain has been increasing over the past month. She had no incedent that made the pain worse.     Limitations  Standing;Walking    How long can you sit comfortably?  No increase in pain when she is sitting down     How long can you stand comfortably?  < 5 minutes beofre she has pain     How long can you walk comfortably?  Pain with limited distances. Can have sharp pain down through her leg    Patient Stated Goals  To have less pain in her back     Currently in Pain?  Yes    Pain Score  7     Pain Location  Back    Pain Orientation  Right;Left;Lower    Pain Descriptors / Indicators  Aching;Sharp    Pain  Type  Chronic pain    Pain Radiating Towards  every no and then     Pain Onset  1 to 4 weeks ago    Pain Frequency  Intermittent    Aggravating Factors   standing and walking     Pain Relieving Factors  sitting down     Effect of Pain on Daily Activities  difficulty walking, dishes and other tasks          [facility] PT Assessment - 08/11/17 0001      Assessment   Medical Diagnosis  Low Back Pain     Referring Provider  Dr Aldine Contes     Onset Date/Surgical Date  -- increase in pain over the last month     Hand Dominance  Right    Next MD Visit  None Scheduled     Prior Therapy  None       Precautions   Precautions  None      Restrictions   Weight Bearing Restrictions  No      Balance Screen   Has the  patient fallen in the past 6 months  No    Has the patient had a decrease in activity level because of a fear of falling?   No    Is the patient reluctant to leave their home because of a fear of falling?   No      Home Environment   Additional Comments  Patient has stairs into her house. they are difficult for her to get up and down       Prior Function   Level of Independence  Independent    Vocation  On disability    Leisure  Nothing       Cognition   Overall Cognitive Status  Within Functional Limits for tasks assessed    Attention  Focused    Focused Attention  Appears intact    Memory  Appears intact    Awareness  Appears intact    Problem Solving  Appears intact      Observation/Other Assessments   Focus on Therapeutic Outcomes (FOTO)   59% limitation 44% prediction       Sensation   Light Touch  Appears Intact    Additional Comments  denies parathesias       Coordination   Gross Motor Movements are Fluid and Coordinated  Yes    Fine Motor Movements are Fluid and Coordinated  Yes      Posture/Postural Control   Posture Comments  forward head, rounded shoulders       AROM   Lumbar Flexion  limited 75% with minimal pain     Lumbar Extension  no limit      Lumbar - Right Side Bend  No limit     Lumbar - Left Side Bend  Pain on the right with side bend to the left     Lumbar - Right Rotation  limited 25%     Lumbar - Left Rotation  limited 25%       PROM   Overall PROM Comments  No limitations in PROM       Strength   Overall Strength Comments  poor core contraction     Right Hip Flexion  4+/5    Right Hip ABduction  4+/5    Right Hip ADduction  4+/5    Left Hip Flexion  4+/5    Left Hip ABduction  4+/5    Left Hip ADduction  4+/5    Right Knee Flexion  5/5    Right Knee Extension  5/5    Left Knee Flexion  5/5    Left Knee Extension  5/5      Special Tests   Other special tests  SLR (-) bilateral       Ambulation/Gait   Gait Comments  decreased hip flexion with gait              Objective measurements completed on examination: See above findings.      [place] Adult PT Treatment/Exercise - 08/11/17 0001      Lumbar Exercises: Stretches   Passive Hamstring Stretch Limitations  3x20 sec hold seated     Lower Trunk Rotation Limitations  x10       Lumbar Exercises: Standing   Other Standing Lumbar Exercises  tennis ball relase to parapsinals and glut       Lumbar Exercises: Supine   AB Set Limitations  abdominal breathing 5sec x 5     Bent Knee Raise Limitations  x10  PT Education - 08/11/17 0948    Education provided  Yes    Education Details  reviewed HEP; strengthening    Methods  Explanation;Demonstration;Verbal cues;Tactile cues    Comprehension  Verbalized understanding;Returned demonstration;Verbal cues required;Tactile cues required       PT Short Term Goals - 08/11/17 1004      PT SHORT TERM GOAL #1   Title  Patient will demonstrate a good core contraction     Time  4    Period  Weeks    Status  New      PT SHORT TERM GOAL #2   Title  Patient will increase gross bilateral lower extremity strength to 4+/5     Time  4    Period  Weeks    Status  New      PT SHORT TERM  GOAL #3   Title  Patient will be independent with initial HEP    Time  4    Period  Weeks    Status  New        PT Long Term Goals - 08/11/17 1004      PT LONG TERM GOAL #1   Title  Pt will be indepnednet wit advance HEP for core and LE strength    Time  8    Period  Weeks    Status  New      PT LONG TERM GOAL #2   Title  Patient will show a 44% limitationon FOTO     Time  6    Period  Weeks    Status  New      PT LONG TERM GOAL #3   Title  Pt will report pain decrease to 2/10 or less with all functional acitivity    Time  8    Period  Weeks    Status  New             Plan - 08/11/17 0953    Clinical Impression Statement  Patient is a 57 year old female with bilateral lower back pain that increases when she stands for any period of time. The pain has increased over the past few months to the point were she is having difficulty standing to perfrom ADL's. She presents with core weakness and mil hip weakness. She has spasming of her bilateral lumbar paraspinals. She would benefit from a program that focuses on improving core strength and posture. She was seen for a moderate complecxtity evalustion 2nd to pain that is effecting her function, and ,mulitple comobidities that will effect her POC.     History and Personal Factors relevant to plan of care:  multi joint arthritis, depression, fibromyalgia     Clinical Presentation  Evolving    Clinical Presentation due to:  Pain that is making ADL's increasingly more difficult     Clinical Decision Making  Moderate    Rehab Potential  Fair    Clinical Impairments Affecting Rehab Potential  multi joint arthrtis, fibromyalgia     PT Frequency  2x / week    PT Duration  6 weeks    PT Treatment/Interventions  ADLs/Self Care Home Management;Cryotherapy;Electrical Stimulation;Iontophoresis 4mg /ml Dexamethasone;Moist Heat;Traction;Ultrasound;Therapeutic exercise;Therapeutic activities;Functional mobility training;Stair  training;Neuromuscular re-education;Gait training;Patient/family education;Manual techniques;Passive range of motion;Dry needling;Taping    PT Home Exercise Plan  lateral trunk rotation; tennis ball trigger point release;     Consulted and Agree with Plan of Care  Patient       Patient will benefit from skilled therapeutic  intervention in order to improve the following deficits and impairments:  Abnormal gait, Pain, Decreased activity tolerance, Decreased endurance, Decreased range of motion, Decreased strength, Impaired perceived functional ability, Difficulty walking, Decreased safety awareness  Visit Diagnosis: Chronic bilateral low back pain without sciatica - Plan: PT plan of care cert/re-cert  Other abnormalities of gait and mobility - Plan: PT plan of care cert/re-cert     Problem List Patient Active Problem List   Diagnosis Date Noted  . AKI (acute kidney injury) ([place]) 08/07/2017  . Back pain 07/29/2017  . Blurry vision 07/29/2017  . Feeling of incomplete bladder emptying 02/27/2017  . Prediabetes 09/29/2014  . Bilateral knee pain 08/18/2014  . Bilateral ocular hypertension 06/27/2014  . GERD (gastroesophageal reflux disease) 05/05/2014  . Abnormal mammogram with microcalcification 10/15/2013  . Chronic cough 12/09/2012  . Seasonal allergies 09/23/2012  . Healthcare maintenance 04/13/2012  . Hyperlipidemia 11/06/2006  . Depression 11/06/2006    Carney Living PT DPT 08/11/2017, 10:11 AM  [facility] [address] [place], [place], [address] Phone: [phone]   Fax:  [phone]  Name: Theresa Kirk MRN: [id number] Date of Birth: 08-07-1960

## 2017-08-22 ENCOUNTER — Ambulatory Visit: Payer: Medicare Other | Admitting: Physical Therapy

## 2017-08-25 ENCOUNTER — Ambulatory Visit: Payer: Medicare Other | Admitting: Physical Therapy

## 2017-09-01 ENCOUNTER — Encounter: Payer: Self-pay | Admitting: Physical Therapy

## 2017-09-01 ENCOUNTER — Ambulatory Visit: Payer: Medicare Other | Attending: Internal Medicine | Admitting: Physical Therapy

## 2017-09-01 DIAGNOSIS — M545 Low back pain, unspecified: Secondary | ICD-10-CM

## 2017-09-01 DIAGNOSIS — G8929 Other chronic pain: Secondary | ICD-10-CM | POA: Insufficient documentation

## 2017-09-01 DIAGNOSIS — R2689 Other abnormalities of gait and mobility: Secondary | ICD-10-CM | POA: Diagnosis not present

## 2017-09-01 NOTE — Therapy (Signed)
[place], [place], [address] Phone: [phone]   Fax:  [phone]  Physical Therapy Treatment  Patient Details  Name: Theresa Kirk MRN: [id number] Date of Birth: May 15, 1961 Referring Provider: Dr Aldine Contes    Encounter Date: 09/01/2017   PT End of Session - 09/01/17 0802    Visit Number  2    Number of Visits  12    Date for PT Re-Evaluation  09/22/17    Authorization Type  UHC MCR     PT Start Time  0802    PT Stop Time  0843    PT Time Calculation (min)  41 min    Activity Tolerance  Patient tolerated treatment well    Behavior During Therapy  [facility] for tasks assessed/performed       Past Medical History:  Diagnosis Date  . Alcohol abuse   . Arthritis   . Carpal tunnel syndrome   . Depression   . Fibromyalgia   . High cholesterol     Past Surgical History:  Procedure Laterality Date  . ABDOMINAL HYSTERECTOMY  2008   h/o uterine fibroids, ?partial hysterectomy per pt 04/13/12  . COLONOSCOPY    . FOOT SURGERY Right   . KNEE SURGERY      There were no vitals filed for this visit.  Subjective Assessment - 09/01/17 0803    Subjective  Patient reports that she has been out of town and unable to come to her appointments but she has been doing her HEP and that has helped her pain. Pt reports that her back is feeling good this morning.    Limitations  Standing;Walking    How long can you sit comfortably?  No increase in pain when she is sitting down     How long can you stand comfortably?  < 5 minutes beofre she has pain     How long can you walk comfortably?  Pain with limited distances. Can have sharp pain down through her leg    Patient Stated Goals  To have less pain in her back     Currently in Pain?  No/denies                No data recorded       [facility] Adult PT Treatment/Exercise - 09/01/17 0001      Lumbar Exercises: Stretches   Passive Hamstring Stretch  Limitations  3x20 sec hold seated     Lower Trunk Rotation Limitations  x10     Piriformis Stretch  3 reps;20 seconds      Lumbar Exercises: Supine   Bent Knee Raise Limitations  x10     Bridge  --    Bridge Limitations  2x10    Other Supine Lumbar Exercises  ball squeeze with abdominal brace 2x10; clamshell 2x10 red     Other Supine Lumbar Exercises  bilateral ER for posture red 2x10; Horizontal abd 2x10 red      Manual Therapy   Manual Therapy  Soft tissue mobilization;Myofascial release;Manual Traction    Soft tissue mobilization  IASTYM to bilateral paraspinals    Manual Traction  LAD to bilateral LE 3x30 sec holds              PT Education - 09/01/17 3474    Education provided  Yes    Education Details  uodated HEP; reviewed the improtance of good posture.     Person(s) Educated  Patient    Methods  Explanation;Demonstration;Tactile cues;Verbal cues    Comprehension  Verbalized understanding;Returned demonstration;Verbal cues required;Tactile cues required       PT Short Term Goals - 09/01/17 0810      PT SHORT TERM GOAL #1   Title  Patient will demonstrate a good core contraction     Baseline  improved core contraction     Time  4    Period  Weeks    Status  On-going      PT SHORT TERM GOAL #2   Title  Patient will increase gross bilateral lower extremity strength to 4+/5     Baseline  working on strengthening     Time  4    Period  Weeks    Status  On-going      PT SHORT TERM GOAL #3   Title  Patient will be independent with initial HEP    Baseline  has been working on inital HEP     Time  4    Period  Weeks    Status  On-going        PT Long Term Goals - 08/11/17 1004      PT LONG TERM GOAL #1   Title  Pt will be indepnednet wit advance HEP for core and LE strength    Time  8    Period  Weeks    Status  New      PT LONG TERM GOAL #2   Title  Patient will show a 44% limitationon FOTO     Time  6    Period  Weeks    Status  New      PT  LONG TERM GOAL #3   Title  Pt will report pain decrease to 2/10 or less with all functional acitivity    Time  8    Period  Weeks    Status  New            Plan - 09/01/17 0808    Clinical Impression Statement  Patient has been out of town because of a family emergency. She feels her home exercises have been beneficial. She would like to continue with therapy. She tolerated exercises well. Therapy will continue to progress activity as tolerated.     Clinical Presentation  Evolving    Clinical Decision Making  Moderate    Rehab Potential  Fair    Clinical Impairments Affecting Rehab Potential  multi joint arthrtis, fibromyalgia     PT Frequency  2x / week    PT Duration  6 weeks    PT Treatment/Interventions  ADLs/Self Care Home Management;Cryotherapy;Electrical Stimulation;Iontophoresis 4mg /ml Dexamethasone;Moist Heat;Traction;Ultrasound;Therapeutic exercise;Therapeutic activities;Functional mobility training;Stair training;Neuromuscular re-education;Gait training;Patient/family education;Manual techniques;Passive range of motion;Dry needling;Taping    PT Next Visit Plan  continue with core strengthening; review stretching     PT Home Exercise Plan  lateral trunk rotation; tennis ball trigger point release;     Consulted and Agree with Plan of Care  Patient       Patient will benefit from skilled therapeutic intervention in order to improve the following deficits and impairments:  Abnormal gait, Pain, Decreased activity tolerance, Decreased endurance, Decreased range of motion, Decreased strength, Impaired perceived functional ability, Difficulty walking, Decreased safety awareness  Visit Diagnosis: Chronic bilateral low back pain without sciatica  Other abnormalities of gait and mobility     Problem List Patient Active Problem List   Diagnosis Date Noted  . AKI (acute kidney injury) ([place]) 08/07/2017  . Back pain 07/29/2017  .  Blurry vision 07/29/2017  . Feeling of  incomplete bladder emptying 02/27/2017  . Prediabetes 09/29/2014  . Bilateral knee pain 08/18/2014  . Bilateral ocular hypertension 06/27/2014  . GERD (gastroesophageal reflux disease) 05/05/2014  . Abnormal mammogram with microcalcification 10/15/2013  . Chronic cough 12/09/2012  . Seasonal allergies 09/23/2012  . Healthcare maintenance 04/13/2012  . Hyperlipidemia 11/06/2006  . Depression 11/06/2006     Carney Living  PT DPT  09/01/2017, 9:57 AM  Cooper Render SPT  09/01/2017   During this treatment session, the therapist was present, participating in and directing the treatment. [place], [place], [address] Phone: [phone]   Fax:  [phone]  Name: Aeriel Boulay MRN: [id number] Date of Birth: 11/25/1960

## 2017-09-08 ENCOUNTER — Ambulatory Visit: Payer: Medicare Other | Admitting: Physical Therapy

## 2017-09-08 ENCOUNTER — Encounter: Payer: Self-pay | Admitting: Physical Therapy

## 2017-09-08 DIAGNOSIS — R2689 Other abnormalities of gait and mobility: Secondary | ICD-10-CM

## 2017-09-08 DIAGNOSIS — G8929 Other chronic pain: Secondary | ICD-10-CM

## 2017-09-08 DIAGNOSIS — M545 Low back pain, unspecified: Secondary | ICD-10-CM

## 2017-09-08 NOTE — Patient Instructions (Signed)

## 2017-09-08 NOTE — Therapy (Signed)
[place], [place], [address] Phone: [phone]   Fax:  [phone]  Physical Therapy Treatment  Patient Details  Name: Theresa Kirk MRN: [id number] Date of Birth: 1961/02/15 Referring Provider: Dr Aldine Contes    Encounter Date: 09/08/2017  PT End of Session - 09/08/17 0858    Visit Number  3    Number of Visits  12    Date for PT Re-Evaluation  09/22/17    Authorization Type  UHC MCR     PT Start Time  0846    PT Stop Time  0925    PT Time Calculation (min)  39 min       Past Medical History:  Diagnosis Date  . Alcohol abuse   . Arthritis   . Carpal tunnel syndrome   . Depression   . Fibromyalgia   . High cholesterol     Past Surgical History:  Procedure Laterality Date  . ABDOMINAL HYSTERECTOMY  2008   h/o uterine fibroids, ?partial hysterectomy per pt 04/13/12  . COLONOSCOPY    . FOOT SURGERY Right   . KNEE SURGERY      There were no vitals filed for this visit.  Subjective Assessment - 09/08/17 0847    Subjective  About the same     Currently in Pain?  Yes    Pain Score  8  with standing and housework     Pain Location  Back    Pain Orientation  Lower    Pain Descriptors / Indicators  Aching    Aggravating Factors   standing and housework                        [facility] Adult PT Treatment/Exercise - 09/08/17 0001      Self-Care   Self-Care  ADL's;Lifting    ADL's  Discussed posture with housework, explained handout     Lifting  Instructed in proper hip hinge for lifting , returned demo x 3       Lumbar Exercises: Stretches   Passive Hamstring Stretch Limitations  3x20 sec hold seated     Lower Trunk Rotation Limitations  x10     Piriformis Stretch  3 reps;20 seconds      Lumbar Exercises: Supine   AB Set Limitations  abdominal breathing 5sec x 5     Bent Knee Raise Limitations  x20    Bridge Limitations  --    Bridge with [employer]  20 reps    Other Supine Lumbar Exercises  clam shell 2 x 10 red     Other Supine Lumbar Exercises  bilateral ER for posture red 2x10; Horizontal abd 2x10 red             PT Education - 09/08/17 0925    Education provided  Yes    Education Details  posture and Body mechanics     Person(s) Educated  Patient    Methods  Explanation;Handout    Comprehension  Verbalized understanding       PT Short Term Goals - 09/01/17 0810      PT SHORT TERM GOAL #1   Title  Patient will demonstrate a good core contraction     Baseline  improved core contraction     Time  4    Period  Weeks    Status  On-going      PT SHORT TERM GOAL #2   Title  Patient will  increase gross bilateral lower extremity strength to 4+/5     Baseline  working on strengthening     Time  4    Period  Weeks    Status  On-going      PT SHORT TERM GOAL #3   Title  Patient will be independent with initial HEP    Baseline  has been working on inital HEP     Time  4    Period  Weeks    Status  On-going        PT Long Term Goals - 08/11/17 1004      PT LONG TERM GOAL #1   Title  Pt will be indepnednet wit advance HEP for core and LE strength    Time  8    Period  Weeks    Status  New      PT LONG TERM GOAL #2   Title  Patient will show a 44% limitationon FOTO     Time  6    Period  Weeks    Status  New      PT LONG TERM GOAL #3   Title  Pt will report pain decrease to 2/10 or less with all functional acitivity    Time  8    Period  Weeks    Status  New            Plan - 09/08/17 1751    Clinical Impression Statement  Pt reports she is about the same. She has no pain at rest however 8/10 pain with housework and standing activities. Instructed pt in proper lifting and reviewed body mechanics with ADLs. Reviewed all HEP and progressed. No increased pain.     PT Next Visit Plan  continue with core strengthening; review stretching; did she practice proer  body mechanics and lifting    PT Home Exercise Plan   lateral trunk rotation; tennis ball trigger point release; supine clam red band, supine ball squeeze, red band horizontal abduction , bridge     Consulted and Agree with Plan of Care  Patient       Patient will benefit from skilled therapeutic intervention in order to improve the following deficits and impairments:  Abnormal gait, Pain, Decreased activity tolerance, Decreased endurance, Decreased range of motion, Decreased strength, Impaired perceived functional ability, Difficulty walking, Decreased safety awareness  Visit Diagnosis: Chronic bilateral low back pain without sciatica  Other abnormalities of gait and mobility     Problem List Patient Active Problem List   Diagnosis Date Noted  . AKI (acute kidney injury) ([place]) 08/07/2017  . Back pain 07/29/2017  . Blurry vision 07/29/2017  . Feeling of incomplete bladder emptying 02/27/2017  . Prediabetes 09/29/2014  . Bilateral knee pain 08/18/2014  . Bilateral ocular hypertension 06/27/2014  . GERD (gastroesophageal reflux disease) 05/05/2014  . Abnormal mammogram with microcalcification 10/15/2013  . Chronic cough 12/09/2012  . Seasonal allergies 09/23/2012  . Healthcare maintenance 04/13/2012  . Hyperlipidemia 11/06/2006  . Depression 11/06/2006    Dorene Ar, PTA 09/08/2017, 9:30 AM  [place], [place], [address] Phone: [phone]   Fax:  [phone]  Name: Theresa Kirk MRN: [id number] Date of Birth: [birth year]

## 2017-09-15 ENCOUNTER — Ambulatory Visit: Payer: Medicare Other | Admitting: Physical Therapy

## 2017-09-17 DIAGNOSIS — H43393 Other vitreous opacities, bilateral: Secondary | ICD-10-CM | POA: Diagnosis not present

## 2017-09-17 DIAGNOSIS — H25013 Cortical age-related cataract, bilateral: Secondary | ICD-10-CM | POA: Diagnosis not present

## 2017-09-22 ENCOUNTER — Ambulatory Visit: Payer: Medicare Other | Admitting: Physical Therapy

## 2017-09-22 ENCOUNTER — Encounter: Payer: Self-pay | Admitting: Physical Therapy

## 2017-09-22 DIAGNOSIS — R2689 Other abnormalities of gait and mobility: Secondary | ICD-10-CM | POA: Diagnosis not present

## 2017-09-22 DIAGNOSIS — M545 Low back pain, unspecified: Secondary | ICD-10-CM

## 2017-09-22 DIAGNOSIS — G8929 Other chronic pain: Secondary | ICD-10-CM

## 2017-09-22 NOTE — Therapy (Addendum)
[place], [place], [address] Phone: [phone]   Fax:  [phone]  Physical Therapy Treatment/ Discharge   Patient Details  Name: Theresa Kirk MRN: [id number] Date of Birth: 1961-02-01 Referring Provider: Dr Aldine Contes    Encounter Date: 09/22/2017  PT End of Session - 09/22/17 0808    Visit Number  4    Number of Visits  12    Date for PT Re-Evaluation  09/22/17    Authorization Type  [employer] MCR     PT Start Time  0800    PT Stop Time  0830 Patien expierienced significant spasming. Treatment stopped    PT Time Calculation (min)  30 min    Activity Tolerance  Patient tolerated treatment well    Behavior During Therapy  WFL for tasks assessed/performed       Past Medical History:  Diagnosis Date  . Alcohol abuse   . Arthritis   . Carpal tunnel syndrome   . Depression   . Fibromyalgia   . High cholesterol     Past Surgical History:  Procedure Laterality Date  . ABDOMINAL HYSTERECTOMY  2008   h/o uterine fibroids, ?partial hysterectomy per pt 04/13/12  . COLONOSCOPY    . FOOT SURGERY Right   . KNEE SURGERY      There were no vitals filed for this visit.  Subjective Assessment - 09/22/17 0805    Subjective  Patient reports that her pain is currently at a 0/10. Patient reports that she woke up yesterday with muscle spasming that started in her abdomen and radiated around to her lower back. Patient stated that the pain was at a 10/10 yesterday and that she considered going to the ED. Patient states that are her last therapy visit that she was feeling the muscle spasms even more. Patient reports that she has not been able to do her HEP in the past few days due to pain.     Limitations  Standing;Walking    How long can you sit comfortably?  No increase in pain when she is sitting down     How long can you stand comfortably?  < 5 minutes beofre she has pain     How long can you walk  comfortably?  Pain with limited distances. Can have sharp pain down through her leg    Patient Stated Goals  To have less pain in her back     Currently in Pain?  No/denies         [facility] PT Assessment - 09/22/17 0001      AROM   Lumbar Flexion  25% without pain     Lumbar Extension  25% limited with pain     Lumbar - Right Side Bend  no pain     Lumbar - Left Side Bend  no pain     Lumbar - Right Rotation  full rotation     Lumbar - Left Rotation  full rotation       Strength   Right Hip Flexion  4+/5    Right Hip ABduction  4+/5    Right Hip ADduction  5/5    Left Hip Flexion  4+/5    Left Hip ABduction  5/5    Right Knee Flexion  5/5    Right Knee Extension  5/5    Left Knee Flexion  5/5    Left Knee Extension  5/5  [place] [facility] PT Treatment/Exercise - 09/22/17 0804      Lumbar Exercises: Stretches   Passive Hamstring Stretch Limitations  3x20 sec hold seated     Piriformis Stretch  20 seconds;1 rep haulted due to pain       Lumbar Exercises: Supine   AB Set Limitations  abdominal breathing     Bent Knee Raise Limitations  x5  haulted due to pain              PT Education - 09/22/17 0807    Education provided  Yes    Education Details  Reviewed exercises in HEP    Person(s) Educated  Patient    Methods  Explanation    Comprehension  Verbalized understanding;Need further instruction       PT Short Term Goals - 09/22/17 0811      PT SHORT TERM GOAL #1   Title  Patient will demonstrate a good core contraction     Baseline  Patient displays good core contraction; pain was felt due to muscle spasm after the contraction at today's visit    Time  4    Period  Weeks    Status  Achieved    Target Date  09/05/17      PT SHORT TERM GOAL #2   Title  Patient will increase gross bilateral lower extremity strength to 4+/5     Baseline  Hip flexion remains 4+/5    Time  4    Period  Weeks    Status  Partially Met    Target Date   09/05/17      PT SHORT TERM GOAL #3   Title  Patient will be independent with initial HEP    Baseline  continues to work on initial HEP    Time  4    Period  Weeks    Status  Achieved    Target Date  09/05/17        PT Long Term Goals - 09/22/17 0846      PT LONG TERM GOAL #1   Title  Pt will be indepnednet wit advance HEP for core and LE strength    Time  8    Period  Weeks    Status  Achieved      PT LONG TERM GOAL #2   Title  Patient will show a 44% limitationon FOTO     Time  6    Period  Weeks    Status  Achieved      PT LONG TERM GOAL #3   Title  Pt will report pain decrease to 2/10 or less with all functional acitivity    Baseline  Patient is still occasionally experiencing increased pain levels     Time  8    Period  Weeks    Status  New            Plan - 09/22/17 7124    Clinical Impression Statement  Patient had a muscle spasm with abdominal breathing. She had to sit for several minutes before it went away. She reports they have been happening everday. She was advised to go back to her MD. An isometric abdominal contraction should not be causing her a significant muscle spasm. She was advised to continue drinking water and to avoid caffine and other beverages that are diuretics. Therapy reviewed stretching with the patient. She has shown increased strength in her legs and improved lumbar motion depsite only coming for limited visits. Therapy varbally  reviewed patients HEP. Patient feels comfortable to discharge today,      Clinical Presentation  Evolving    Clinical Decision Making  Moderate    Rehab Potential  Fair    Clinical Impairments Affecting Rehab Potential  multi joint arthrtis, fibromyalgia     PT Frequency  2x / week    PT Duration  6 weeks    PT Treatment/Interventions  ADLs/Self Care Home Management;Cryotherapy;Electrical Stimulation;Iontophoresis 89m/ml Dexamethasone;Moist Heat;Traction;Ultrasound;Therapeutic exercise;Therapeutic  activities;Functional mobility training;Stair training;Neuromuscular re-education;Gait training;Patient/family education;Manual techniques;Passive range of motion;Dry needling;Taping    PT Next Visit Plan  continue with core strengthening; review stretching; did she practice proer  body mechanics and lifting    PT Home Exercise Plan  lateral trunk rotation; tennis ball trigger point release; supine clam red band, supine ball squeeze, red band horizontal abduction , bridge     Consulted and Agree with Plan of Care  Patient       Patient will benefit from skilled therapeutic intervention in order to improve the following deficits and impairments:  Abnormal gait, Pain, Decreased activity tolerance, Decreased endurance, Decreased range of motion, Decreased strength, Impaired perceived functional ability, Difficulty walking, Decreased safety awareness  Visit Diagnosis: Chronic bilateral low back pain without sciatica  Other abnormalities of gait and mobility  PHYSICAL THERAPY DISCHARGE SUMMARY  Visits from Start of Care: 4  Current functional level related to goals / functional outcomes: Patient reports no pain when she is not doing activity. She reports the pain with activity has improved slightly. She is limited at this time by abdominal cramps with basic isometric exercises.    Remaining deficits: Spasming with isometrics and stretches     Education / Equipment: HEP  Plan: Patient agrees to discharge.  Patient goals were not met. Patient is being discharged due to being pleased with the current functional level.  ?????       Problem List Patient Active Problem List   Diagnosis Date Noted  . [place] 08/07[place]  . Back pain 07/29/2017  . Blurry vision 07/29/2017  . Feeling of incomplete bladder emptying 02/27/2017  . Prediabetes 09/29/2014  . Bilateral knee pain 08/18/2014  . Bilateral ocular hypertension 06/27/2014  . GERD (gastroesophageal reflux  disease) 05/05/2014  . Abnormal mammogram with microcalcification 10/15/2013  . Chronic cough 12/09/2012  . Seasonal allergies 09/23/2012  . Healthcare maintenance 04/13/2012  . Hyperlipidemia 11/06/2006  . Depression 11/06/2006  DCarolyne LittlesPT DPT  09/22/2017   DCarney LivingSPT 09/22/2017, 11:39 AM   During this treatment session, the therapist was present, participating in and directing the treatment.  [place] [address]Phone: [phone]  Fax:  [phone] Name: Theresa HarcumMRN: [id number] of Birth: 1May 16, 1962

## 2017-09-22 NOTE — Therapy (Signed)
[place], [place], [address] Phone: [phone]   Fax:  [phone]  Physical Therapy Treatment  Patient Details  Name: Theresa Kirk MRN: [id number] Date of Birth: [birth year] Referring Provider: Dr Aldine Contes    Encounter Date: 09/22/2017  PT End of Session - 09/22/17 0808    Visit Number  4    Number of Visits  12    Date for PT Re-Evaluation  09/22/17    Authorization Type  [employer] MCR     PT Start Time  0800    PT Stop Time  0830    PT Time Calculation (min)  30 min    Activity Tolerance  Patient tolerated treatment well    Behavior During Therapy  [facility] for tasks assessed/performed       Past Medical History:  Diagnosis Date  . Alcohol abuse   . Arthritis   . Carpal tunnel syndrome   . Depression   . Fibromyalgia   . High cholesterol     Past Surgical History:  Procedure Laterality Date  . ABDOMINAL HYSTERECTOMY  2008   h/o uterine fibroids, ?partial hysterectomy per pt 04/13/12  . COLONOSCOPY    . FOOT SURGERY Right   . KNEE SURGERY      There were no vitals filed for this visit.  Subjective Assessment - 09/22/17 0805    Subjective  Patient reports that her pain is currently at a 0/10. Patient reports that she woke up yesterday with muscle spasming that started in her abdomen and radiated around to her lower back. Patient stated that the pain was at a 10/10 yesterday and that she considered going to the ED. Patient states that are her last therapy visit that she was feeling the muscle spasms even more. Patient reports that she has not been able to do her HEP in the past few days due to pain.     Limitations  Standing;Walking    How long can you sit comfortably?  No increase in pain when she is sitting down     How long can you stand comfortably?  < 5 minutes beofre she has pain     How long can you walk comfortably?  Pain with limited distances. Can have sharp pain down through her  leg    Patient Stated Goals  To have less pain in her back     Currently in Pain?  No/denies         [facility] PT Assessment - 09/22/17 0001      AROM   Lumbar Flexion  25% without pain     Lumbar Extension  25% limited with pain     Lumbar - Right Side Bend  no pain     Lumbar - Left Side Bend  no pain     Lumbar - Right Rotation  full rotation     Lumbar - Left Rotation  full rotation       Strength   Right Hip Flexion  4+/5    Right Hip ABduction  4+/5    Right Hip ADduction  5/5    Left Hip Flexion  4+/5    Left Hip ABduction  5/5    Right Knee Flexion  5/5    Right Knee Extension  5/5    Left Knee Flexion  5/5    Left Knee Extension  5/5  [facility] PT Treatment/Exercise - 09/22/17 0804      Lumbar Exercises: Stretches   Passive Hamstring Stretch Limitations  3x20 sec hold seated     Piriformis Stretch  20 seconds;1 rep haulted due to pain       Lumbar Exercises: Supine   AB Set Limitations  abdominal breathing     Bent Knee Raise Limitations  x5  haulted due to pain              PT Education - 09/22/17 0807    Education Details  Reviewed exercises in HEP    Person(s) Educated  Patient    Methods  Explanation    Comprehension  Verbalized understanding;Need further instruction       PT Short Term Goals - 09/22/17 0811      PT SHORT TERM GOAL #1   Title  Patient will demonstrate a good core contraction     Baseline  Patient displays good core contraction; pain was felt due to muscle spasm after the contraction at today's visit    Time  4    Period  Weeks    Status  Achieved    Target Date  09/05/17      PT SHORT TERM GOAL #2   Title  Patient will increase gross bilateral lower extremity strength to 4+/5     Baseline  Hip flexion remains 4+/5    Time  4    Period  Weeks    Status  Partially Met    Target Date  09/05/17      PT SHORT TERM GOAL #3   Title  Patient will be independent with initial HEP    Baseline   continues to work on initial HEP    Time  4    Period  Weeks    Status  Achieved    Target Date  09/05/17        PT Long Term Goals - 09/22/17 0846      PT LONG TERM GOAL #1   Title  Pt will be indepnednet wit advance HEP for core and LE strength    Time  8    Period  Weeks    Status  Achieved      PT LONG TERM GOAL #2   Title  Patient will show a 44% limitationon FOTO     Time  6    Period  Weeks    Status  Achieved      PT LONG TERM GOAL #3   Title  Pt will report pain decrease to 2/10 or less with all functional acitivity    Baseline  Patient is still occasionally experiencing increased pain levels     Time  8    Period  Weeks    Status  New            Plan - 09/22/17 3244    Clinical Impression Statement  Patient had a muscle spasm with abdominal breathing. She had to sit for several minutes before it went away. She reports they have been happening everday. She was advised to go back to her MD. An isometric abdominal contraction should not be causing her a significant muscle spasm. She was advised to continue drinking water and to avoid caffine and other beverages that are diuretics. Therapy reviewed stretching with the patient. She has shown increased strength in her legs and improved lumbar motion depsite only coming for limited visits.     Clinical Presentation  Evolving  Clinical Decision Making  Moderate    Rehab Potential  Fair    Clinical Impairments Affecting Rehab Potential  multi joint arthrtis, fibromyalgia     PT Frequency  2x / week    PT Duration  6 weeks    PT Treatment/Interventions  ADLs/Self Care Home Management;Cryotherapy;Electrical Stimulation;Iontophoresis 67m/ml Dexamethasone;Moist Heat;Traction;Ultrasound;Therapeutic exercise;Therapeutic activities;Functional mobility training;Stair training;Neuromuscular re-education;Gait training;Patient/family education;Manual techniques;Passive range of motion;Dry needling;Taping    PT Next Visit Plan   continue with core strengthening; review stretching; did she practice proer  body mechanics and lifting    PT Home Exercise Plan  lateral trunk rotation; tennis ball trigger point release; supine clam red band, supine ball squeeze, red band horizontal abduction , bridge     Consulted and Agree with Plan of Care  Patient       Patient will benefit from skilled therapeutic intervention in order to improve the following deficits and impairments:  Abnormal gait, Pain, Decreased activity tolerance, Decreased endurance, Decreased range of motion, Decreased strength, Impaired perceived functional ability, Difficulty walking, Decreased safety awareness  Visit Diagnosis: Chronic bilateral low back pain without sciatica  Other abnormalities of gait and mobility     Problem List Patient Active Problem List   Diagnosis Date Noted  . AKI ([place] injury) ([place] 08/07/2017  . Back pain 07/29/2017  . Blurry vision 07/29/2017  . Feeling of incomplete bladder emptying 02/27/2017  . Prediabetes 09/29/2014  . Bilateral knee pain 08/18/2014  . Bilateral ocular hypertension 06/27/2014  . GERD (gastroesophageal reflux disease) 05/05/2014  . Abnormal mammogram with microcalcification 10/15/2013  . Chronic cough 12/09/2012  . Seasonal allergies 09/23/2012  . Healthcare maintenance 04/13/2012  . Hyperlipidemia 11/06/2006  . Depression 11/06/2006    NCooper Render4/22/2019, 8:49 AM  [facility]GM[place] [address]Phone: [phone]  Fax:  [phone] Name: Theresa DroegeMRN: [id number] of Birth: 104/29/1962

## 2017-10-08 ENCOUNTER — Other Ambulatory Visit: Payer: Self-pay | Admitting: Internal Medicine

## 2017-11-20 ENCOUNTER — Encounter (INDEPENDENT_AMBULATORY_CARE_PROVIDER_SITE_OTHER): Payer: Self-pay

## 2017-11-20 ENCOUNTER — Encounter: Payer: Self-pay | Admitting: Internal Medicine

## 2017-11-20 ENCOUNTER — Ambulatory Visit (INDEPENDENT_AMBULATORY_CARE_PROVIDER_SITE_OTHER): Payer: Medicare Other | Admitting: Internal Medicine

## 2017-11-20 ENCOUNTER — Other Ambulatory Visit: Payer: Self-pay

## 2017-11-20 DIAGNOSIS — E785 Hyperlipidemia, unspecified: Secondary | ICD-10-CM | POA: Diagnosis not present

## 2017-11-20 DIAGNOSIS — F329 Major depressive disorder, single episode, unspecified: Secondary | ICD-10-CM

## 2017-11-20 DIAGNOSIS — Z79899 Other long term (current) drug therapy: Secondary | ICD-10-CM | POA: Diagnosis not present

## 2017-11-20 DIAGNOSIS — K219 Gastro-esophageal reflux disease without esophagitis: Secondary | ICD-10-CM

## 2017-11-20 DIAGNOSIS — X58XXXA Exposure to other specified factors, initial encounter: Secondary | ICD-10-CM | POA: Diagnosis not present

## 2017-11-20 DIAGNOSIS — Z90710 Acquired absence of both cervix and uterus: Secondary | ICD-10-CM | POA: Diagnosis not present

## 2017-11-20 DIAGNOSIS — S39011A Strain of muscle, fascia and tendon of abdomen, initial encounter: Secondary | ICD-10-CM | POA: Diagnosis not present

## 2017-11-20 DIAGNOSIS — M545 Low back pain: Secondary | ICD-10-CM

## 2017-11-20 DIAGNOSIS — G8929 Other chronic pain: Secondary | ICD-10-CM | POA: Diagnosis not present

## 2017-11-20 DIAGNOSIS — K5900 Constipation, unspecified: Secondary | ICD-10-CM | POA: Diagnosis not present

## 2017-11-20 DIAGNOSIS — M722 Plantar fascial fibromatosis: Secondary | ICD-10-CM

## 2017-11-20 MED ORDER — PANTOPRAZOLE SODIUM 40 MG PO TBEC: 40.0000 mg | DELAYED_RELEASE_TABLET | Freq: Every day | ORAL | 1 refills | Status: DC

## 2017-11-20 NOTE — Assessment & Plan Note (Signed)
Patient reports recent right heel pain described as an achy sensation just distal to her heel on the plantar surface. Pain is worse when getting up in the morning and when being active after rest. She denies any injury. A/P: Patient with plantar fasciitis of the right foot. ROM and strength at the ankle are intact. She has some tenderness at the medial edge of her right foot on the plantar surface just distal to the heel. I have low suspicion for a Jones fracture. I have recommended and demonstrated stretching exercises for her to complete at home.

## 2017-11-20 NOTE — Progress Notes (Signed)
Medicine attending: Medical history, presenting problems, physical findings, and medications, reviewed with resident physician Dr Vishal Patel on the day of the patient visit and I concur with his evaluation and management plan. 

## 2017-11-20 NOTE — Patient Instructions (Addendum)
It was a pleasure to meet you Theresa Kirk.  Your stomach pain is likely from straining your abdominal muscle. Please avoid lifting, carrying, or pulling heavy objects. You may try Voltaren gel for your pain as well.  Your right foot pain is likely from plantar fasciitis. Please try the stretching exercises I showed you.  Please follow up with Dr. Dareen Piano in about 6 months or sooner if needed.     Plantar Fasciitis Rehab Ask your health care provider which exercises are safe for you. Do exercises exactly as told by your health care provider and adjust them as directed. It is normal to feel mild stretching, pulling, tightness, or discomfort as you do these exercises, but you should stop right away if you feel sudden pain or your pain gets worse. Do not begin these exercises until told by your health care provider. Stretching and range of motion exercises These exercises warm up your muscles and joints and improve the movement and flexibility of your foot. These exercises also help to relieve pain. Exercise A: Plantar fascia stretch  1. Sit with your left / right leg crossed over your opposite knee. 2. Hold your heel with one hand with that thumb near your arch. With your other hand, hold your toes and gently pull them back toward the top of your foot. You should feel a stretch on the bottom of your toes or your foot or both. 3. Hold this stretch for__________ seconds. 4. Slowly release your toes and return to the starting position. Repeat __________ times. Complete this exercise __________ times a day. Exercise B: Gastroc, standing  1. Stand with your hands against a wall. 2. Extend your left / right leg behind you, and bend your front knee slightly. 3. Keeping your heels on the floor and keeping your back knee straight, shift your weight toward the wall without arching your back. You should feel a gentle stretch in your left / right calf. 4. Hold this position for __________  seconds. Repeat __________ times. Complete this exercise __________ times a day. Exercise C: Soleus, standing 1. Stand with your hands against a wall. 2. Extend your left / right leg behind you, and bend your front knee slightly. 3. Keeping your heels on the floor, bend your back knee and slightly shift your weight over the back leg. You should feel a gentle stretch deep in your calf. 4. Hold this position for __________ seconds. Repeat __________ times. Complete this exercise __________ times a day. Exercise D: Gastrocsoleus, standing 1. Stand with the ball of your left / right foot on a step. The ball of your foot is on the walking surface, right under your toes. 2. Keep your other foot firmly on the same step. 3. Hold onto the wall or a railing for balance. 4. Slowly lift your other foot, allowing your body weight to press your heel down over the edge of the step. You should feel a stretch in your left / right calf. 5. Hold this position for __________ seconds. 6. Return both feet to the step. 7. Repeat this exercise with a slight bend in your left / right knee. Repeat __________ times with your left / right knee straight and __________ times with your left / right knee bent. Complete this exercise __________ times a day. Balance exercise This exercise builds your balance and strength control of your arch to help take pressure off your plantar fascia. Exercise E: Single leg stand 1. Without shoes, stand near a railing or in  a doorway. You may hold onto the railing or door frame as needed. 2. Stand on your left / right foot. Keep your big toe down on the floor and try to keep your arch lifted. Do not let your foot roll inward. 3. Hold this position for __________ seconds. 4. If this exercise is too easy, you can try it with your eyes closed or while standing on a pillow. Repeat __________ times. Complete this exercise __________ times a day. This information is not intended to replace  advice given to you by your health care provider. Make sure you discuss any questions you have with your health care provider. Document Released: 05/20/2005 Document Revised: 01/23/2016 Document Reviewed: 04/03/2015 Elsevier Interactive Patient Education  2018 [employer].

## 2017-11-20 NOTE — Assessment & Plan Note (Signed)
Patient reports about 1 month of left sided abdominal wall pain occurring about once a week, most notably when bending over or doing housework/moving objects around. It feels like tightening of the abdominal wall with sharp sensation. She denies any radiation of the pain. It feels different than her heartburn. She denies any heavy lifting or injury. She reports some constipation and denies diarrhea, fevers, or sweats. She had a prior hysterectomy. She denies any loss of bowel/bladder. A/P: Abdomen is soft, NT, +BS, without hernia, guarding, or rebound tenderness. No mass felt. Well-healed surgical midline surgical scar is present. Patient likely has an abdominal wall strain which exacerbates with housework. I have recommended avoiding heavy lifting, pulling, or pushing of objects. She can try Voltaren gel over the area as needed.

## 2017-11-20 NOTE — Assessment & Plan Note (Signed)
Patient has chronic GERD controlled with Pantoprazole. She is requesting a refill. A/P:  Chronic stable GERD. Will continue Pantoprazole 40 mg daily for now.

## 2017-11-20 NOTE — Progress Notes (Signed)
CC: Abdominal wall strain  HPI:  [name] is a 57 y.o. female with past medical history as listed below including chronic low back pain, HLD, Depression, and GERD who presents for evaluation of abdominal wall pain and right heel pain. Please see problem based charting for status of patient's chronic medical issues.  Strain of abdominal wall Patient reports about 1 month of left sided abdominal wall pain occurring about once a week, most notably when bending over or doing housework/moving objects around. It feels like tightening of the abdominal wall with sharp sensation. She denies any radiation of the pain. It feels different than her heartburn. She denies any heavy lifting or injury. She reports some constipation and denies diarrhea, fevers, or sweats. She had a prior hysterectomy. She denies any loss of bowel/bladder. A/P: Abdomen is soft, NT, +BS, without hernia, guarding, or rebound tenderness. No mass felt. Well-healed surgical midline surgical scar is present. Patient likely has an abdominal wall strain which exacerbates with housework. I have recommended avoiding heavy lifting, pulling, or pushing of objects. She can try Voltaren gel over the area as needed.  Plantar fasciitis of right foot Patient reports recent right heel pain described as an achy sensation just distal to her heel on the plantar surface. Pain is worse when getting up in the morning and when being active after rest. She denies any injury. A/P: Patient with plantar fasciitis of the right foot. ROM and strength at the ankle are intact. She has some tenderness at the medial edge of her right foot on the plantar surface just distal to the heel. I have low suspicion for a Jones fracture. I have recommended and demonstrated stretching exercises for her to complete at home.  GERD (gastroesophageal reflux disease) Patient has chronic GERD controlled with Pantoprazole. She is requesting a refill. A/P:  Chronic stable  GERD. Will continue Pantoprazole 40 mg daily for now.    Past Medical History:  Diagnosis Date  . Alcohol abuse   . Arthritis   . Carpal tunnel syndrome   . Depression   . Fibromyalgia   . High cholesterol    Review of Systems:   Review of Systems  Constitutional: Negative for diaphoresis and fever.  Respiratory: Negative for shortness of breath.   Cardiovascular: Negative for chest pain.  Gastrointestinal: Positive for constipation. Negative for diarrhea, heartburn, nausea and vomiting.       Abdominal muscle pain  Musculoskeletal: Negative for falls.       Right heel pain     Physical Exam:  Vitals:   11/20/17 0846  BP: 128/81  Pulse: 80  Temp: 98.4 F (36.9 C)  TempSrc: Oral  SpO2: 100%  Weight: 208 lb 12.8 oz (94.7 kg)  Height: 5\' 5"  (1.651 m)   Physical Exam  Constitutional: She appears well-developed and well-nourished.  Non-toxic appearance. She does not appear ill. No distress.  HENT:  Head: Normocephalic and atraumatic.  Cardiovascular: Normal rate and regular rhythm.  Pulmonary/Chest: Effort normal. No respiratory distress. She has no wheezes. She has no rales.  Abdominal: Bowel sounds are normal. She exhibits no mass. There is no tenderness. There is no rigidity, no rebound, no guarding and no CVA tenderness. No hernia.  Well healed surgical scar midline abdomen below umbilicus  Musculoskeletal:  Right foot tender to touch medial aspect distal to heel, ROM intact, strength 5/5 with flexion/extension at ankle  Skin: Skin is warm.     Assessment & Plan:   See Encounters Tab for  problem based charting.  Patient discussed with Dr. Beryle Beams

## 2017-12-19 ENCOUNTER — Telehealth: Payer: Self-pay | Admitting: Internal Medicine

## 2017-12-30 ENCOUNTER — Other Ambulatory Visit: Payer: Self-pay

## 2017-12-30 ENCOUNTER — Encounter (INDEPENDENT_AMBULATORY_CARE_PROVIDER_SITE_OTHER): Payer: Self-pay

## 2017-12-30 ENCOUNTER — Encounter: Payer: Self-pay | Admitting: Internal Medicine

## 2017-12-30 ENCOUNTER — Ambulatory Visit (INDEPENDENT_AMBULATORY_CARE_PROVIDER_SITE_OTHER): Payer: Medicare Other | Admitting: Internal Medicine

## 2017-12-30 VITALS — BP 139/83 | HR 84 | Temp 98.2°F | Ht 65.0 in | Wt 206.9 lb

## 2017-12-30 DIAGNOSIS — Z90710 Acquired absence of both cervix and uterus: Secondary | ICD-10-CM | POA: Diagnosis not present

## 2017-12-30 DIAGNOSIS — G8929 Other chronic pain: Secondary | ICD-10-CM

## 2017-12-30 DIAGNOSIS — M549 Dorsalgia, unspecified: Secondary | ICD-10-CM

## 2017-12-30 DIAGNOSIS — R238 Other skin changes: Secondary | ICD-10-CM

## 2017-12-30 DIAGNOSIS — M545 Low back pain, unspecified: Secondary | ICD-10-CM

## 2017-12-30 DIAGNOSIS — L728 Other follicular cysts of the skin and subcutaneous tissue: Secondary | ICD-10-CM | POA: Diagnosis not present

## 2017-12-30 DIAGNOSIS — Z87891 Personal history of nicotine dependence: Secondary | ICD-10-CM | POA: Diagnosis not present

## 2017-12-30 DIAGNOSIS — Z79899 Other long term (current) drug therapy: Secondary | ICD-10-CM

## 2017-12-30 HISTORY — DX: Other skin changes: R23.8

## 2017-12-30 MED ORDER — DICLOFENAC SODIUM 1 % TD GEL: 4.0000 g | Freq: Four times a day (QID) | TRANSDERMAL | 2 refills | Status: DC

## 2017-12-30 NOTE — Patient Instructions (Signed)
Theresa Kirk, we have given you some supplies to help keep the area of your skin that is irritated covered and protected.  Continue to use vaseline and try these gauze pads for protection.  If the pain is still not letting up you can substitute the vaseline with voltaren gel for the pain.  If you develop any breaks in the skin, pus or other signs of infection please return for reevaluation.

## 2017-12-30 NOTE — Assessment & Plan Note (Signed)
Pt requires refills on medications with associated diagnosis above.  Reviewed disease process and find this medication to be necessary, will not change dose or alter current therapy. 

## 2017-12-30 NOTE — Progress Notes (Signed)
CC: Skin irritation  HPI:  Theresa Kirk is a 57 y.o. female with PMH below.  She presents today with acute on chronic skin irritation at her old midline incision from her hysterectomy performed in 2008.    Please see A&P for status of the patient's chronic medical conditions  Past Medical History:  Diagnosis Date  . Alcohol abuse   . Arthritis   . Carpal tunnel syndrome   . Depression   . Fibromyalgia   . High cholesterol    Review of Systems:  ROS: Pulmonary: pt denies increased work of breathing, shortness of breath,  Cardiac: pt denies palpitations, chest pain,  Abdominal: pt denies abdominal pain, nausea, vomiting, or diarrhea  Physical Exam:  Vitals:   12/30/17 1500  BP: 139/83  Pulse: 84  Temp: 98.2 F (36.8 C)  TempSrc: Oral  SpO2: 100%  Weight: 206 lb 14.4 oz (93.8 kg)  Height: 5\' 5"  (1.651 m)   Physical Exam  Constitutional: No distress.  Cardiovascular: Normal rate, regular rhythm and normal heart sounds. Exam reveals no gallop and no friction rub.  No murmur heard. Pulmonary/Chest: Effort normal and breath sounds normal. No respiratory distress. She has no wheezes. She has no rales. She exhibits no tenderness.  Abdominal: Soft. Bowel sounds are normal. She exhibits no distension and no mass. There is no tenderness. There is no rebound and no guarding.  Neurological: She is alert.  Skin: She is not diaphoretic.  Patient with midline scar about 2 cm below navel traveling to waistline.  There is a small 1-1/2 cm fluctuant well-circumscribed cyst like area of the skin that is midline and abuts the waistline.  Skin is not erythematous but is painful to the touch.  Just below this within the waistline the portion of skin that is in contact with this cyst like area is also very tender to palpation.  Ultrasound of the cyst like lesion shows fluid without any debris or echogenicity suggestive of serous fluid.      Social History   Socioeconomic History  .  Marital status: Single    Spouse name: Not on file  . Number of children: Not on file  . Years of education: Not on file  . Highest education level: Not on file  Occupational History  . Not on file  Social Needs  . Financial resource strain: Not on file  . Food insecurity:    Worry: Not on file    Inability: Not on file  . Transportation needs:    Medical: Not on file    Non-medical: Not on file  Tobacco Use  . Smoking status: Former Smoker    Last attempt to quit: 06/03/2000    Years since quitting: 17.5  . Smokeless tobacco: Never Used  Substance and Sexual Activity  . Alcohol use: Yes    Alcohol/week: 0.0 oz    Comment: Sometimes.  . Drug use: Yes    Types: Marijuana  . Sexual activity: Not on file  Lifestyle  . Physical activity:    Days per week: Not on file    Minutes per session: Not on file  . Stress: Not on file  Relationships  . Social connections:    Talks on phone: Not on file    Gets together: Not on file    Attends religious service: Not on file    Active member of club or organization: Not on file    Attends meetings of clubs or organizations: Not on file  Relationship status: Not on file  . Intimate partner violence:    Fear of current or ex partner: Not on file    Emotionally abused: Not on file    Physically abused: Not on file    Forced sexual activity: Not on file  Other Topics Concern  . Not on file  Social History Narrative   Lives with mother in [place].   Currently unemployed.   Used to work at [employer] in [place].   ETOH use: weekend, 1-2 beers from [employer]: used to drink everyday   Former smoker   Current THC use   H/o cocaine use: quit in 2001.    Family History  Problem Relation Age of Onset  . Diabetes Mother   . Hypertension Mother   . Stroke Mother   . Diabetes Sister   . Thyroid disease Sister   . Hypertension Sister   . Cancer Maternal Aunt        unknown  . Breast cancer Maternal Aunt     Assessment & Plan:    See Encounters Tab for problem based charting.  Patient discussed with Dr. Rebeca Alert

## 2017-12-30 NOTE — Assessment & Plan Note (Signed)
Patient reporting intense pain along lower end of midline incision from hysterectomy in 2008.  No trauma to the area no heavy lifting.  3rd time she has had pain that was bad enough to come to the doctor. Some sort of cream possibly neosporin has helped in the past.   Exam and ultrasound were reassuring.  No visible signs of infection, no systemic symptoms of infection.  This is a recurrent issue and is partly due to body habitus and skin folds on skin friction.    -Encouraged continue use of Vaseline -Gave patient 4 x 4 gauze with some tape to help protect the area -Gave some alcohol swabs for cleaning the area -If area continues to be painful she can use some of her Voltaren gel for her back in place of the Vaseline

## 2017-12-30 NOTE — Progress Notes (Signed)
Internal Medicine Clinic Attending  I saw and evaluated the patient.  I personally confirmed the key portions of the history and exam documented by Dr. Shan Levans and I reviewed pertinent patient test results.  The assessment, diagnosis, and plan were formulated together and I agree with the documentation in the resident's note.  Here for swelling along her midline scar as described by Dr. Shan Levans. Small cyst at base of scar along waistline, likely due to irritation from the waist of her pants. I personally supervised the bedside US evaluation which showed a simple superficial cyst <1 cm deep with no communicating structures and no evidence of surrounding edema. We will try to avoid further irritation and provide barrier protection and see if it resolves.   Lenice Pressman, M.D., Ph.D.

## 2018-02-03 ENCOUNTER — Ambulatory Visit (HOSPITAL_COMMUNITY)
Admission: RE | Admit: 2018-02-03 | Discharge: 2018-02-03 | Disposition: A | Discharge status: Home / Self Care | Payer: Medicare Other | Source: Ambulatory Visit | Attending: Internal Medicine | Admitting: Internal Medicine

## 2018-02-03 ENCOUNTER — Other Ambulatory Visit: Payer: Self-pay

## 2018-02-03 ENCOUNTER — Ambulatory Visit (INDEPENDENT_AMBULATORY_CARE_PROVIDER_SITE_OTHER): Payer: Medicare Other | Admitting: Internal Medicine

## 2018-02-03 ENCOUNTER — Encounter: Payer: Self-pay | Admitting: Internal Medicine

## 2018-02-03 VITALS — BP 117/59 | HR 72 | Temp 98.4°F | Ht 65.0 in | Wt 206.5 lb

## 2018-02-03 DIAGNOSIS — R7303 Prediabetes: Secondary | ICD-10-CM | POA: Diagnosis not present

## 2018-02-03 DIAGNOSIS — Z23 Encounter for immunization: Secondary | ICD-10-CM | POA: Diagnosis not present

## 2018-02-03 DIAGNOSIS — M25531 Pain in right wrist: Secondary | ICD-10-CM | POA: Diagnosis not present

## 2018-02-03 DIAGNOSIS — M549 Dorsalgia, unspecified: Secondary | ICD-10-CM

## 2018-02-03 DIAGNOSIS — F339 Major depressive disorder, recurrent, unspecified: Secondary | ICD-10-CM

## 2018-02-03 DIAGNOSIS — Z90710 Acquired absence of both cervix and uterus: Secondary | ICD-10-CM

## 2018-02-03 DIAGNOSIS — M545 Low back pain, unspecified: Secondary | ICD-10-CM

## 2018-02-03 DIAGNOSIS — G8929 Other chronic pain: Secondary | ICD-10-CM

## 2018-02-03 DIAGNOSIS — Z79899 Other long term (current) drug therapy: Secondary | ICD-10-CM

## 2018-02-03 DIAGNOSIS — F32A Depression, unspecified: Secondary | ICD-10-CM

## 2018-02-03 DIAGNOSIS — Z0000 Encounter for general adult medical examination without abnormal findings: Secondary | ICD-10-CM

## 2018-02-03 DIAGNOSIS — F329 Major depressive disorder, single episode, unspecified: Secondary | ICD-10-CM

## 2018-02-03 DIAGNOSIS — R238 Other skin changes: Secondary | ICD-10-CM

## 2018-02-03 DIAGNOSIS — R053 Chronic cough: Secondary | ICD-10-CM

## 2018-02-03 DIAGNOSIS — R05 Cough: Secondary | ICD-10-CM

## 2018-02-03 DIAGNOSIS — K219 Gastro-esophageal reflux disease without esophagitis: Secondary | ICD-10-CM | POA: Diagnosis not present

## 2018-02-03 HISTORY — DX: Pain in right wrist: M25.531

## 2018-02-03 LAB — POCT GLYCOSYLATED HEMOGLOBIN (HGB A1C): HEMOGLOBIN A1C: 6 % — AB (ref 4.0–5.6)

## 2018-02-03 LAB — GLUCOSE, CAPILLARY: Glucose-Capillary: 128 mg/dL — ABNORMAL HIGH (ref 70–99)

## 2018-02-03 NOTE — Assessment & Plan Note (Signed)
-  This problem is chronic and stable -Patient states that her back pain has improved -She is not currently on any medication for this but does occasionally use her Voltaren gel on her back which is helpful -No further work-up at this time

## 2018-02-03 NOTE — Progress Notes (Signed)
   Subjective:    Patient ID: Theresa Kirk, female    DOB: 04/17/1961, 57 y.o.   MRN: [id number]  HPI I have seen and examined this patient.  Patient is here for routine follow-up of her GERD and prediabetes.  Patient does complain of intermittent right wrist pain and numbness in her right middle finger.  Patient states pain is crampy, over her right wrist, nonradiating and associated with difficulty holding objects.  He states that she has had numbness in the right middle finger for a while and this is constant.  She also complains of a small nonhealing wound at the base of her abdominal incisions scar.  She denies any other complaints at this time and states that she is compliant with all her medications.   Review of Systems  Constitutional: Negative.   HENT: Negative.   Respiratory: Negative.   Cardiovascular: Negative.   Gastrointestinal: Negative.   Musculoskeletal: Positive for arthralgias and back pain. Negative for joint swelling and myalgias.  Skin: Positive for wound.       Patient complains of a small nonhealing wound at the base of her midline abdominal incision scar  Neurological: Negative.   Psychiatric/Behavioral: Negative.        Objective:   Physical Exam  Constitutional: She is oriented to person, place, and time. She appears well-developed and well-nourished.  HENT:  Head: Normocephalic and atraumatic.  Mouth/Throat: Oropharynx is clear and moist. No oropharyngeal exudate.  Neck: Neck supple.  Cardiovascular: Normal rate and regular rhythm.  No murmur heard. Pulmonary/Chest: Effort normal and breath sounds normal. She has no wheezes. She has no rales.  Abdominal: Soft. Bowel sounds are normal. She exhibits no distension. There is no tenderness.  Musculoskeletal: Normal range of motion. She exhibits no edema.  Patient with a negative Phalen's as well as Tinel's test.  On examination of her right wrist her grip strength was intact and she had no tenderness to  palpation over her right wrist, no erythema, no increased local warmth.  Lymphadenopathy:    She has no cervical adenopathy.  Neurological: She is alert and oriented to person, place, and time.  Psychiatric: She has a normal mood and affect. Her behavior is normal.          Assessment & Plan:  Please see problem based charting for assessment and plan:

## 2018-02-03 NOTE — Assessment & Plan Note (Signed)
-  This problem is chronic and stable -We will follow-up a repeat A1c today -No further work-up at this time

## 2018-02-03 NOTE — Assessment & Plan Note (Signed)
-  This problem is chronic and stable -Patient states that her symptoms are well controlled with Protonix -We will continue this medication for now -No further work-up at this time

## 2018-02-03 NOTE — Assessment & Plan Note (Signed)
-  This problem is chronic and stable -Patient follows up at [facility] for this -Patient states that she has been taken off her Seroquel as well as her Depakote -She is on clonidine, bupropion and Abilify for this as well as trazodone to help her sleep -She will follow-up with her psychiatrist at [facility] -No further work-up at this time

## 2018-02-03 NOTE — Assessment & Plan Note (Signed)
-  Patient states that she has had a "cyst" at the base of her midline incision scar from her hysterectomy 2008 -On examination patient was not noted to have a cyst but did have a small nonhealing wound at the base of the scar -No purulent drainage noted, no erythema or tenderness or increased local warmth around the area -I provided her with some Tefla dressing as well as bacitracin and some paper tape to help to protect the area. -I suspect that this wound will heal soon

## 2018-02-03 NOTE — Assessment & Plan Note (Signed)
-  Patient complains of pain in her right wrist over the last month -Patient states that the pain is crampy in nature, nonradiating, over her right wrist and associated with difficulty holding objects -She denies any swelling or redness over the area or any trauma to the area -We will check x-ray of her right wrist today -I have referred her to sports medicine for further evaluation -No further work-up at this time

## 2018-02-03 NOTE — Patient Instructions (Signed)
-  It was a pleasure seeing you today -We will check some blood work on you today -I will refer you to sports medicine for your right wrist pain -We will check an x-ray of the right wrist today -We will give you a flu shot today -We will give you a dressing for the wound on your abdomen -Please follow-up with me in 3 months

## 2018-02-03 NOTE — Assessment & Plan Note (Signed)
-  Patient will receive a flu shot today

## 2018-02-03 NOTE — Assessment & Plan Note (Signed)
-  This problem has now resolved since she has been on her Protonix for her GERD -It is likely that her chronic cough is secondary to her GERD -No further work-up at this time

## 2018-02-04 ENCOUNTER — Encounter: Payer: Self-pay | Admitting: Internal Medicine

## 2018-02-04 LAB — BMP8+ANION GAP
ANION GAP: 19 mmol/L — AB (ref 10.0–18.0)
BUN / CREAT RATIO: 11 (ref 9–23)
BUN: 11 mg/dL (ref 6–24)
CHLORIDE: 102 mmol/L (ref 96–106)
CO2: 21 mmol/L (ref 20–29)
CREATININE: 1.02 mg/dL — AB (ref 0.57–1.00)
Calcium: 9.5 mg/dL (ref 8.7–10.2)
GFR calc Af Amer: 72 mL/min/{1.73_m2} (ref >59)
GFR calc non Af Amer: 62 mL/min/{1.73_m2} (ref >59)
GLUCOSE: 139 mg/dL — AB (ref 65–99)
Potassium: 4.3 mmol/L (ref 3.5–5.2)
Sodium: 142 mmol/L (ref 134–144)

## 2018-02-05 ENCOUNTER — Ambulatory Visit (INDEPENDENT_AMBULATORY_CARE_PROVIDER_SITE_OTHER): Payer: Medicare Other | Admitting: Sports Medicine

## 2018-02-05 ENCOUNTER — Encounter: Payer: Self-pay | Admitting: Sports Medicine

## 2018-02-05 VITALS — BP 108/72 | Ht 65.0 in | Wt 206.0 lb

## 2018-02-05 DIAGNOSIS — M545 Low back pain, unspecified: Secondary | ICD-10-CM

## 2018-02-05 DIAGNOSIS — G8929 Other chronic pain: Secondary | ICD-10-CM | POA: Diagnosis not present

## 2018-02-05 DIAGNOSIS — M79644 Pain in right finger(s): Secondary | ICD-10-CM

## 2018-02-05 MED ORDER — DICLOFENAC SODIUM 1 % TD GEL: 4.0000 g | Freq: Four times a day (QID) | TRANSDERMAL | 1 refills | Status: DC | PRN

## 2018-02-05 NOTE — Progress Notes (Signed)
  Theresa Kirk - 57 y.o. female MRN [id number]  Date of birth: 1960-06-24    SUBJECTIVE:      Chief Complaint:/ HPI:  57 year old female presents with right radial thumb/wrist pain.  She has had this pain on and off for the past approximately 2 years.  Today she reports with 2 weeks of pain that ranges from 0- 8/10.  Her pain is increased with use of the hand and having to grip and lift objects.  She denies any specific mechanism of injury.  It is slightly relieved with over-the-counter anti-inflammatories.  She denies any swelling, erythema, bruising.  She denies any numbness or tingling in the hand.  She denies any significant weakness in the hand but notes that when she experiences sharp pain she feels like she has decreased grip strength.  He denies any history of gout   ROS:     See HPI  PERTINENT  PMH / PSH FH / / SH:  Past Medical, Surgical, Social, and Family History Reviewed & Updated in the EMR.  Pertinent findings include:  The patient does have a history of diagnosis of carpal tunnel syndrome and fibromyalgia  OBJECTIVE: BP 108/72   Ht 5\' 5"  (1.651 m)   Wt 206 lb (93.4 kg)   LMP 09/14/2006   BMI 34.28 kg/m   Physical Exam:  Vital signs are reviewed.  GEN: Alert and oriented, NAD Pulm: Breathing unlabored PSY: normal mood, congruent affect  MSK: Right hand: Inspection: No obvious deformity. No swelling, erythema or bruising Palpation: Mild tenderness over the first [facility] joint.  No tenderness proximally over the first dorsal compartment. ROM: Full ROM of the digits and wrist Strength: 5/5 strength in the forearm, wrist and interosseus muscles.  No pain with resisted movement Neurovascular: NV intact Special tests: Negative finkelstein's, negative tinel's at the carpal tunnel.  No significant pain with CMC grind  Left hand: No obvious deformity or swelling.  No tenderness to palpation Full range of motion of the fingers and wrist with normal strength N/V  intact Negative Finkelstein's  ASSESSMENT & PLAN:  1.  Right first [facility] joint pain likely due to mild arthritis- patient had recent x-rays of the right wrist performed.  These were independently reviewed today.  There is very slight spurring but no significant degenerative changes noted at the [facility]. - We will refill Voltaren gel.  She may apply up to 4 times daily for pain control -Patient fight with thumb spica splint to wear during increase activity or as needed for comfort - She will follow up as needed.  If pain worsens or recurs, consider treatment with intra-articular steroid injection

## 2018-02-05 NOTE — Patient Instructions (Signed)
Your hand pain is caused by mild arthritis at the base of your thumb. Today we will refill your Voltaren gel.  You can use this 4 times daily for pain relief Wear the thumb spica splint as needed for comfort and during activity. You have no activity restrictions. You may follow-up as needed.

## 2018-02-27 ENCOUNTER — Other Ambulatory Visit: Payer: Self-pay | Admitting: Internal Medicine

## 2018-02-27 DIAGNOSIS — Z1231 Encounter for screening mammogram for malignant neoplasm of breast: Secondary | ICD-10-CM

## 2018-03-17 IMAGING — CR DG CERVICAL SPINE COMPLETE 4+V
5 series · 5 of 5 positions shown · non-contrast
Comparison: 03/28/2015 .

CLINICAL DATA: Chronic pain.

EXAM:
CERVICAL SPINE - COMPLETE 4+ VIEW

[c-spine lat]
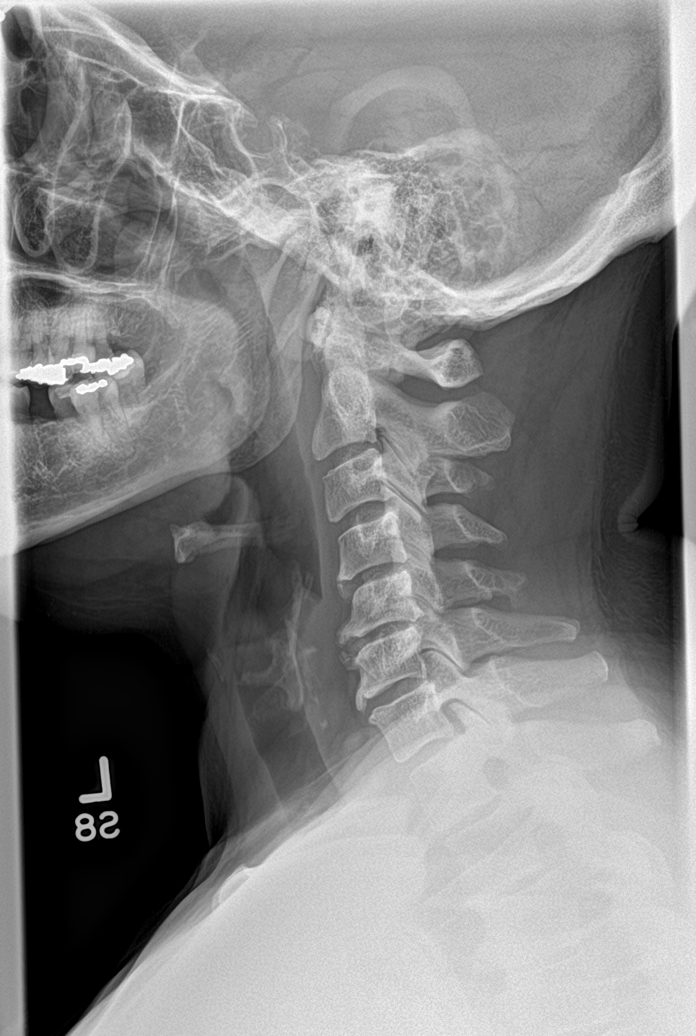

[c-spine obl (1 of 2)]
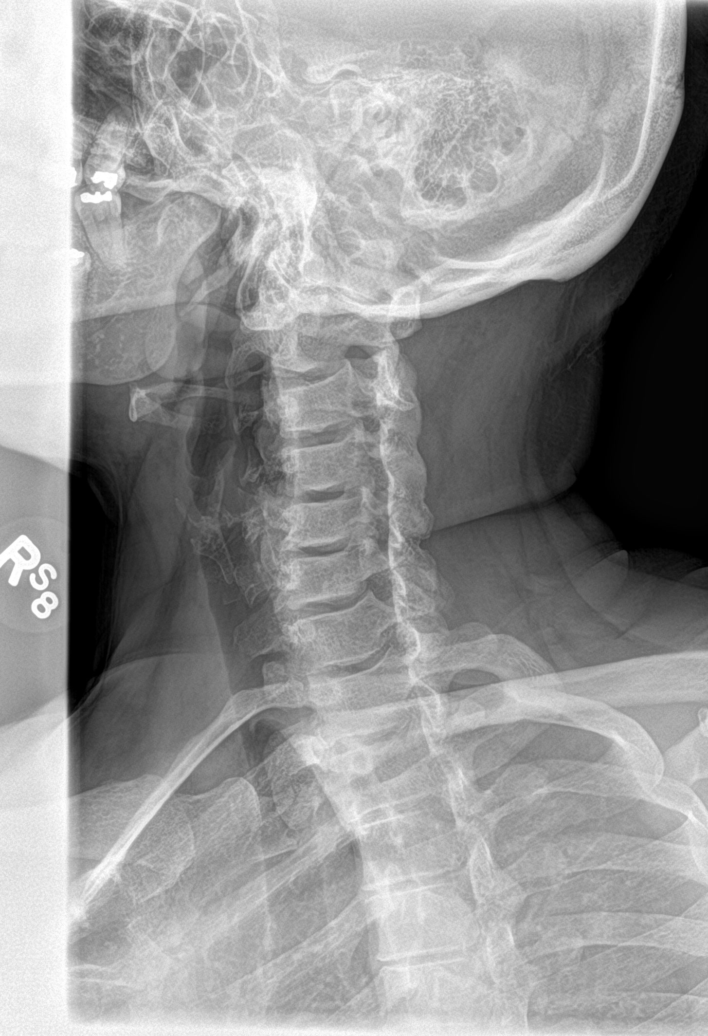

[c-spine ap]
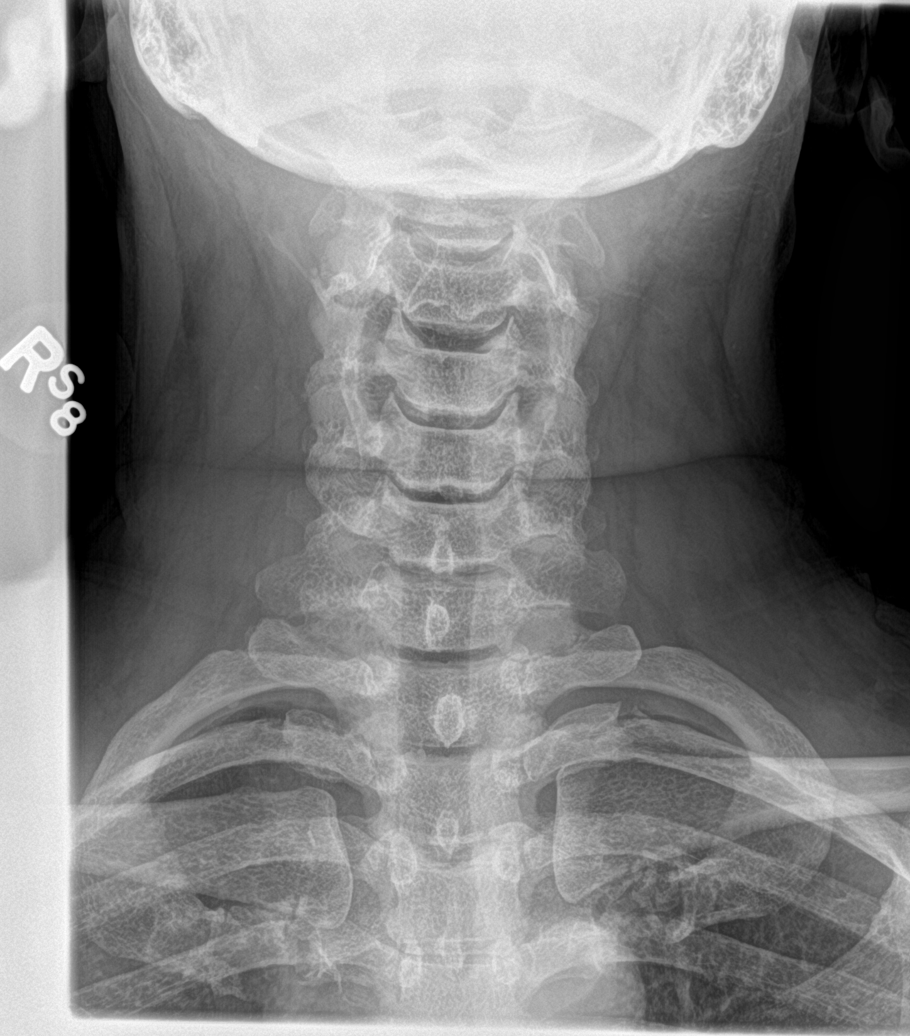

[c-spine open mouth]
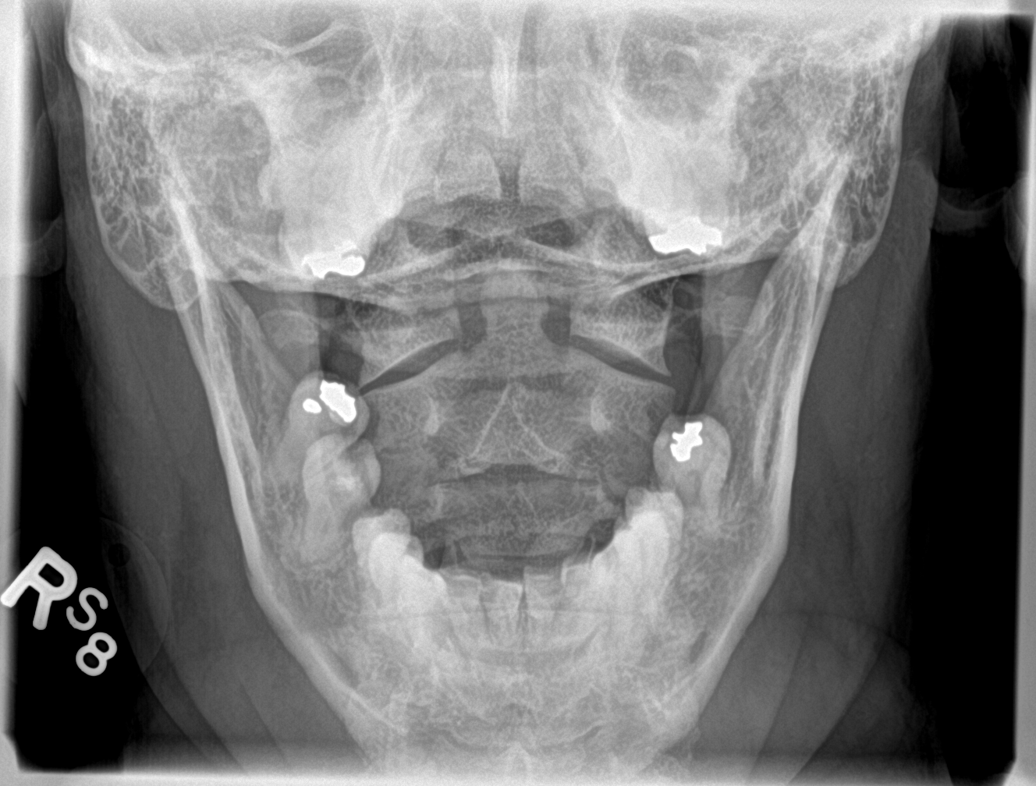

[c-spine obl (2 of 2)]
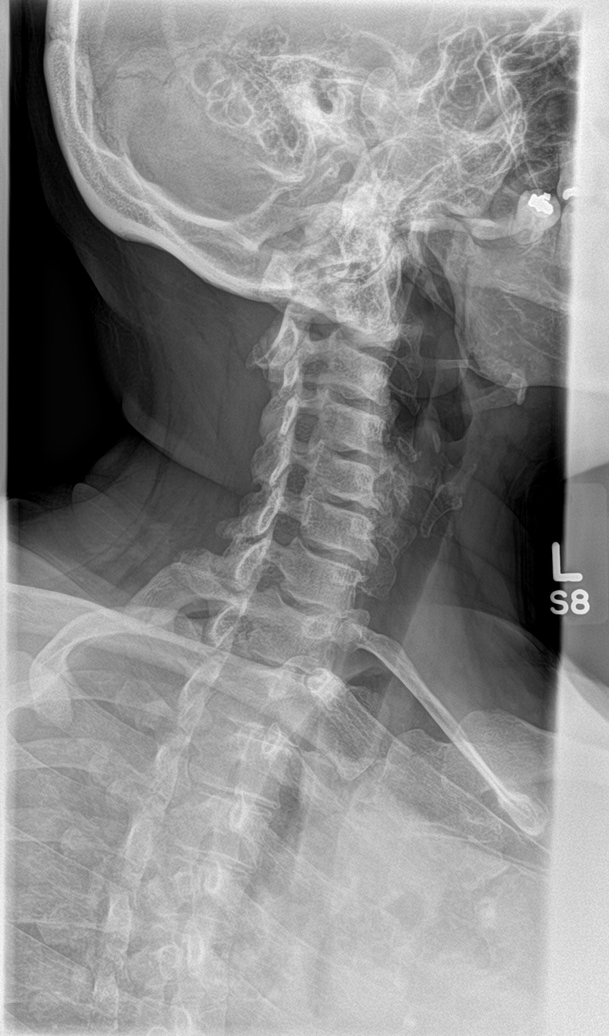

[5 of 5 positions shown; findings below may reference images not displayed]

FINDINGS: Diffuse multilevel degenerative change. Mild straightening of the
cervical spine. Similar findings noted on prior exam . This may be
degenerative . No acute bony abnormality identified .
IMPRESSION: Diffuse multilevel degenerative change. Similar findings noted on
prior exam. No acute abnormality identified.

## 2018-03-27 ENCOUNTER — Ambulatory Visit
Admission: RE | Admit: 2018-03-27 | Discharge: 2018-03-27 | Disposition: A | Discharge status: Home / Self Care | Payer: Medicare Other | Source: Ambulatory Visit | Attending: Internal Medicine | Admitting: Internal Medicine

## 2018-03-27 DIAGNOSIS — Z1231 Encounter for screening mammogram for malignant neoplasm of breast: Secondary | ICD-10-CM | POA: Diagnosis not present

## 2018-05-13 ENCOUNTER — Ambulatory Visit: Payer: Medicare Other

## 2018-05-15 ENCOUNTER — Other Ambulatory Visit: Payer: Self-pay

## 2018-05-15 ENCOUNTER — Ambulatory Visit (INDEPENDENT_AMBULATORY_CARE_PROVIDER_SITE_OTHER): Payer: Medicare Other | Admitting: Internal Medicine

## 2018-05-15 DIAGNOSIS — R05 Cough: Secondary | ICD-10-CM | POA: Diagnosis not present

## 2018-05-15 DIAGNOSIS — J3489 Other specified disorders of nose and nasal sinuses: Secondary | ICD-10-CM | POA: Diagnosis not present

## 2018-05-15 DIAGNOSIS — R110 Nausea: Secondary | ICD-10-CM

## 2018-05-15 DIAGNOSIS — R0981 Nasal congestion: Secondary | ICD-10-CM | POA: Diagnosis not present

## 2018-05-15 MED ORDER — ALBUTEROL SULFATE HFA 108 (90 BASE) MCG/ACT IN AERS: 1.0000 | INHALATION_SPRAY | Freq: Four times a day (QID) | RESPIRATORY_TRACT | 0 refills | Status: DC | PRN

## 2018-05-15 NOTE — Patient Instructions (Addendum)
Theresa Kirk,  I am sorry to hear you are not feeling well. It seems like you have an ongoing cold. I would recommend you to continue using Mucinex and Flonase. You can also try using a Neti pot with some warm water to rinse out your nose and see if this helps your congestion. If you start noticing any fevers give [place] a call.

## 2018-05-15 NOTE — Progress Notes (Addendum)
   CC: no taste or smell for 3 weeks  HPI:  [name] is a 57 y.o. with a PMHx listed below presenting to the clinic today because she has not been able to taste or smell anything for the past 3 weeks. She had a cold 3 weeks ago and most of her symptoms resolved; however, her nasal congestion has persisted.   For details of today's visit and the status of his chronic medical issues please refer to the assessment and plan.   Past Medical History:  Diagnosis Date  . Alcohol abuse   . Arthritis   . Carpal tunnel syndrome   . Depression   . Fibromyalgia   . High cholesterol    Review of Systems:   Review of Systems  Constitutional: Negative for chills and fever.  HENT: Positive for congestion and sinus pain. Negative for ear discharge, ear pain and sore throat.   Respiratory: Positive for cough and sputum production. Negative for hemoptysis, shortness of breath and wheezing.   Gastrointestinal: Positive for nausea. Negative for abdominal pain and vomiting.  Endo/Heme/Allergies: Positive for environmental allergies.    Physical Exam:  Vitals:   05/15/18 0918  BP: 111/77  Pulse: 71  Temp: 97.9 F (36.6 C)  TempSrc: Oral  SpO2: 100%  Weight: 209 lb 3.2 oz (94.9 kg)  Height: 5\' 5"  (1.651 m)   Physical Exam  Constitutional: She is oriented to person, place, and time and well-developed, well-nourished, and in no distress.  HENT:  Right Ear: External ear normal.  Left Ear: External ear normal.  Nose: Nose normal.  Mouth/Throat: Oropharynx is clear and moist. No oropharyngeal exudate.  Eyes: Conjunctivae are normal. Right eye exhibits no discharge. Left eye exhibits no discharge.  Cardiovascular: Normal rate, regular rhythm and normal heart sounds.  No murmur heard. Pulmonary/Chest: Effort normal and breath sounds normal. No respiratory distress. She has no wheezes. She has no rales.  Abdominal: Soft. Bowel sounds are normal. She exhibits no distension. There is no  abdominal tenderness.  Neurological: She is alert and oriented to person, place, and time.  Skin: Skin is warm and dry.  Psychiatric: Mood, memory, affect and judgment normal.    Assessment & Plan:   See Encounters Tab for problem based charting.  Patient seen with Dr. Angelia Mould

## 2018-05-15 NOTE — Assessment & Plan Note (Signed)
Patient reported she has not been able to taste or smell for the past 3 weeks. She had a cold 3 weeks ago and stated most of her symptoms have improved but she is still having nasal congestion and post nasal drip. She reported her nasal drainage is green and she is still having some cough with green sputum production as well. She is having headaches. She denies any fevers, sinus pressure, or ear pain/drainage. She has been using mucinex with some improvement and tried using flonase for a couple days which she did not feel helped her. She is also continuing to take cetirizine.   Plan: -continue using mucinex, flonase and certirizine  -try using a neti pot with warm water  -if she starts noticing any fevers she was instructed to call the office

## 2018-05-18 NOTE — Progress Notes (Signed)
Internal Medicine Clinic Attending  I saw and evaluated the patient.  I personally confirmed the key portions of the history and exam documented by Dr.  Rehman  and I reviewed pertinent patient test results.  The assessment, diagnosis, and plan were formulated together and I agree with the documentation in the resident's note.  

## 2018-06-06 ENCOUNTER — Other Ambulatory Visit: Payer: Self-pay | Admitting: Internal Medicine

## 2018-06-08 NOTE — Telephone Encounter (Signed)
Next appt scheduled 06/23/18 with PCP.

## 2018-06-23 ENCOUNTER — Encounter: Payer: Self-pay | Admitting: Internal Medicine

## 2018-06-23 ENCOUNTER — Other Ambulatory Visit: Payer: Self-pay

## 2018-06-23 ENCOUNTER — Ambulatory Visit (INDEPENDENT_AMBULATORY_CARE_PROVIDER_SITE_OTHER): Payer: Medicare Other | Admitting: Internal Medicine

## 2018-06-23 VITALS — BP 122/73 | HR 76 | Temp 97.9°F | Ht 65.0 in | Wt 210.4 lb

## 2018-06-23 DIAGNOSIS — Z79899 Other long term (current) drug therapy: Secondary | ICD-10-CM

## 2018-06-23 DIAGNOSIS — M25531 Pain in right wrist: Secondary | ICD-10-CM | POA: Diagnosis not present

## 2018-06-23 DIAGNOSIS — K219 Gastro-esophageal reflux disease without esophagitis: Secondary | ICD-10-CM

## 2018-06-23 DIAGNOSIS — E785 Hyperlipidemia, unspecified: Secondary | ICD-10-CM

## 2018-06-23 DIAGNOSIS — R350 Frequency of micturition: Secondary | ICD-10-CM

## 2018-06-23 DIAGNOSIS — F32A Depression, unspecified: Secondary | ICD-10-CM

## 2018-06-23 DIAGNOSIS — R0981 Nasal congestion: Secondary | ICD-10-CM

## 2018-06-23 DIAGNOSIS — F329 Major depressive disorder, single episode, unspecified: Secondary | ICD-10-CM

## 2018-06-23 DIAGNOSIS — F339 Major depressive disorder, recurrent, unspecified: Secondary | ICD-10-CM

## 2018-06-23 DIAGNOSIS — R3914 Feeling of incomplete bladder emptying: Secondary | ICD-10-CM

## 2018-06-23 NOTE — Assessment & Plan Note (Signed)
-  This problem is chronic and stable -Patient's PHQ 9 score today 16 up from 13 in December -Patient continues to follow-up at [facility] for this -She is on clonidine, bupropion and Abilify for this as well as trazodone to help her sleep -Patient follow-up with her psychiatrist at [facility] -She does complain of intermittent feelings of hot and cold as well as some dryness in her mouth.  We will recheck a TSH on her today -No further work-up at this time

## 2018-06-23 NOTE — Assessment & Plan Note (Signed)
-  This problem is chronic and stable -Patient states that she still has a feeling of incomplete emptying her bladder but that her urinary frequency has decreased -She follows up with urology as an outpatient for this -We have made an appointment for her to follow-up with Dr. Gloriann Loan at [facility] urology next Thursday -We will check a BMP today -No further work-up at this time

## 2018-06-23 NOTE — Assessment & Plan Note (Signed)
-  Patient is seen in the clinic in December for this issue.  She states that she was compliant with her cetirizine and Flonase -Her symptoms have now resolved -No further work-up at this time

## 2018-06-23 NOTE — Assessment & Plan Note (Signed)
-  This problem is chronic and stable -We will recheck a lipid panel again today -Patient is not on a statin at this time

## 2018-06-23 NOTE — Progress Notes (Signed)
   Subjective:    Patient ID: Theresa Kirk, female    DOB: [birth year], 58 y.o.   MRN: [id number]  HPI  I have seen and examined this patient.  Patient is here for routine follow-up of her reflux as well as her hyperlipidemia.  Patient states that she has episodes where she feels hot and then cold.  She also complains of some dryness in her mouth.  She states that her chronic cough is resolved and that her nasal congestion has improved.  She denies any other acute complaints at this time.  She states that she is compliant with all her medications.  Review of Systems  Constitutional: Negative.   HENT: Negative for congestion, rhinorrhea, sinus pain, sore throat and voice change.        Patient does complain of some dryness in her mouth  Respiratory: Negative.   Cardiovascular: Negative.   Gastrointestinal: Negative.   Genitourinary: Negative for dysuria and hematuria.       Patient states that she still has a feeling of incomplete emptying of her bladder but this is also improved since her last visit and she is not using the restroom as often as she was before  Musculoskeletal: Positive for arthralgias.       Patient still has some mild pain at the base of her right thumb but this is improving  Neurological: Negative.   Psychiatric/Behavioral: Negative.        Objective:   Physical Exam Constitutional:      Appearance: Normal appearance.  HENT:     Head: Normocephalic and atraumatic.     Mouth/Throat:     Mouth: Mucous membranes are moist.     Pharynx: Oropharynx is clear. No oropharyngeal exudate.  Neck:     Musculoskeletal: Neck supple.  Cardiovascular:     Rate and Rhythm: Normal rate and regular rhythm.     Heart sounds: Normal heart sounds. No murmur.  Pulmonary:     Effort: Pulmonary effort is normal.     Breath sounds: No wheezing or rales.  Abdominal:     General: Bowel sounds are normal. There is no distension.     Palpations: Abdomen is soft.     Tenderness:  There is no abdominal tenderness.  Musculoskeletal: Normal range of motion.        General: No swelling or tenderness.     Comments: Patient with no tenderness on palpation at the base of her right thumb and has full range of motion in the right thumb including flexion and extension and opposition.  Lymphadenopathy:     Cervical: No cervical adenopathy.  Neurological:     General: No focal deficit present.     Mental Status: She is alert and oriented to person, place, and time.  Psychiatric:        Behavior: Behavior normal.           Assessment & Plan:  Please see problem based charting for assessment and plan:

## 2018-06-23 NOTE — Patient Instructions (Signed)
-  It was a pleasure seeing you today -Please follow-up with me in 3 months -We will check some blood work today including a thyroid function as well as kidney function and cholesterol -Please follow-up at [facility] for your depression -Continue with the Voltaren gel for your right wrist pain -Please follow-up with urology for your incomplete bladder emptying -Please call me if you have any questions

## 2018-06-23 NOTE — Assessment & Plan Note (Signed)
-  This problem is chronic and stable -Patient states that her symptoms are well controlled with Protonix.  She does occasionally get flares when she eats food that does not agree with her but otherwise well controlled. -She denies any chronic cough -We will continue with Protonix 40 mg daily for now

## 2018-06-23 NOTE — Assessment & Plan Note (Addendum)
-  Patient states that the pain at the base of her right thumb is slowly improving -She does occasionally have episodes where she finds it difficult to straighten her right thumb and has some tingling or numbness in her fingers -Currently she has no pain in her thumb and she has full range of motion with no tenderness to palpation -She states that the pain has substantially improved since her last visit -Patient use Voltaren gel as needed -We will continue to monitor for this

## 2018-06-24 ENCOUNTER — Telehealth: Payer: Self-pay | Admitting: Internal Medicine

## 2018-06-24 LAB — BMP8+ANION GAP
Anion Gap: 21 mmol/L — ABNORMAL HIGH (ref 10.0–18.0)
BUN/Creatinine Ratio: 15 (ref 9–23)
BUN: 14 mg/dL (ref 6–24)
CALCIUM: 9.5 mg/dL (ref 8.7–10.2)
CO2: 19 mmol/L — AB (ref 20–29)
Chloride: 103 mmol/L (ref 96–106)
Creatinine, Ser: 0.93 mg/dL (ref 0.57–1.00)
GFR calc Af Amer: 79 mL/min/{1.73_m2} (ref >59)
GFR calc non Af Amer: 69 mL/min/{1.73_m2} (ref >59)
Glucose: 114 mg/dL — ABNORMAL HIGH (ref 65–99)
POTASSIUM: 4.3 mmol/L (ref 3.5–5.2)
Sodium: 143 mmol/L (ref 134–144)

## 2018-06-24 LAB — LIPID PANEL
CHOLESTEROL TOTAL: 248 mg/dL — AB (ref 100–199)
Chol/HDL Ratio: 5.4 ratio — ABNORMAL HIGH (ref 0.0–4.4)
HDL: 46 mg/dL (ref >39)
LDL Calculated: 175 mg/dL — ABNORMAL HIGH (ref 0–99)
Triglycerides: 136 mg/dL (ref 0–149)
VLDL CHOLESTEROL CAL: 27 mg/dL (ref 5–40)

## 2018-06-24 LAB — TSH: TSH: 0.915 u[IU]/mL (ref 0.450–4.500)

## 2018-06-24 NOTE — Telephone Encounter (Signed)
I called the patient to discuss the results of her blood work with her.  Patient's creatinine and electrolytes were within normal limits.  Patient's LDL was elevated at 175 with an HDL of 46.  According to the ASCVD calculator she is a low risk patient and does not qualify for statin therapy currently.  We will continue to monitor her yearly and start a statin if appropriate in the future.  Case discussed with patient in detail and she agrees with current plan.

## 2018-06-25 DIAGNOSIS — R3915 Urgency of urination: Secondary | ICD-10-CM | POA: Diagnosis not present

## 2018-06-25 DIAGNOSIS — R3911 Hesitancy of micturition: Secondary | ICD-10-CM | POA: Diagnosis not present

## 2018-07-13 ENCOUNTER — Ambulatory Visit (INDEPENDENT_AMBULATORY_CARE_PROVIDER_SITE_OTHER): Payer: Medicare Other | Admitting: Internal Medicine

## 2018-07-13 ENCOUNTER — Other Ambulatory Visit: Payer: Self-pay

## 2018-07-13 VITALS — BP 125/79 | HR 96 | Temp 97.6°F | Ht 65.0 in | Wt 205.7 lb

## 2018-07-13 DIAGNOSIS — J310 Chronic rhinitis: Secondary | ICD-10-CM | POA: Insufficient documentation

## 2018-07-13 DIAGNOSIS — J329 Chronic sinusitis, unspecified: Secondary | ICD-10-CM | POA: Diagnosis not present

## 2018-07-13 DIAGNOSIS — R05 Cough: Secondary | ICD-10-CM | POA: Diagnosis not present

## 2018-07-13 MED ORDER — AMOXICILLIN 875 MG PO TABS: 875.0000 mg | ORAL_TABLET | Freq: Two times a day (BID) | ORAL | 0 refills | Status: DC

## 2018-07-13 NOTE — Assessment & Plan Note (Signed)
[name] presents with uncomplicated rhinosinusitis for the past 2 months.  Differential includes viral versus bacterial.  We will plan on treating with antibiotics given that she has failed to improve despite symptom management and has had an extended length of illness > seven-day period.   Plan: 1. Amoxicillin 875 mg twice a day for 5 days 2. Cetirizine 10 mg daily  3. Flonase  4. Nasal irrigation

## 2018-07-13 NOTE — Progress Notes (Signed)
   CC: Cough and congestion  HPI:  [name] is a 58 y.o. female with HLD, GERD, and depression who presents with cough and congestion.  She states that she began to have cough and congestion over 2 months ago.  She has tried Flonase and NyQuil without alleviation of her symptoms.  She said at one point over a month and a half ago she had dental pain which was treated with "ibuprofen and penicillin".  She states that the ibuprofen caused facial swelling and she stopped taking it.  She does not believe that the penicillin helped with her symptoms.  She denies fevers, shortness of breath, sinus pain.  She denies any sick contacts.  Per chart review, was seen in clinic on January 21 (2 weeks ago) for nasal congestion.  At that time she did not have any symptoms and states that she was asked to take cetirizine and Flonase.  [name] does state today that she has been taking Flonase but not cetirizine.   Past Medical History:  Diagnosis Date  . Alcohol abuse   . Arthritis   . Carpal tunnel syndrome   . Depression   . Fibromyalgia   . High cholesterol    Review of Systems:   Review of Systems  Constitutional: Negative for chills, fever and malaise/fatigue.  HENT: Positive for congestion. Negative for sinus pain and sore throat.   Respiratory: Positive for cough. Negative for shortness of breath.   Cardiovascular: Negative for chest pain and palpitations.  Gastrointestinal: Negative for abdominal pain, constipation, diarrhea and vomiting.  Genitourinary: Negative for dysuria, frequency and hematuria.  All other systems reviewed and are negative.   Physical Exam:  Vitals:   07/13/18 0931  BP: 125/79  Pulse: 96  Temp: 97.6 F (36.4 C)  TempSrc: Oral  SpO2: 99%  Weight: 205 lb 11.2 oz (93.3 kg)  Height: 5\' 5"  (1.651 m)   Physical Exam Vitals signs reviewed.  Constitutional:      Appearance: Normal appearance.  HENT:     Head: Normocephalic and atraumatic.   Right Ear: Tympanic membrane, ear canal and external ear normal.     Left Ear: Tympanic membrane, ear canal and external ear normal.     Nose: Congestion and rhinorrhea present.  Cardiovascular:     Rate and Rhythm: Normal rate and regular rhythm.     Pulses: Normal pulses.     Heart sounds: Normal heart sounds.  Pulmonary:     Effort: Pulmonary effort is normal. No respiratory distress.     Breath sounds: Normal breath sounds.  Musculoskeletal:        General: No tenderness.     Right lower leg: No edema.     Left lower leg: No edema.  Skin:    General: Skin is warm and dry.  Neurological:     Mental Status: She is alert and oriented to person, place, and time. Mental status is at baseline.  Psychiatric:        Mood and Affect: Mood normal.        Behavior: Behavior normal.     Assessment & Plan:   See Encounters Tab for problem based charting.  Patient discussed with Dr. Dareen Piano

## 2018-07-13 NOTE — Patient Instructions (Addendum)
Please take:  Amoxicillin 875 mg twice a day for 5 days Cetirizine 10 mg daily  Flonase  Nasal irrigation

## 2018-07-15 NOTE — Progress Notes (Signed)
Internal Medicine Clinic Attending  Case discussed with Dr. Prince at the time of the visit.  We reviewed the resident's history and exam and pertinent patient test results.  I agree with the assessment, diagnosis, and plan of care documented in the resident's note.   

## 2018-08-13 ENCOUNTER — Ambulatory Visit (INDEPENDENT_AMBULATORY_CARE_PROVIDER_SITE_OTHER): Payer: Medicare Other | Admitting: Otolaryngology

## 2018-08-26 ENCOUNTER — Telehealth: Payer: Self-pay | Admitting: Internal Medicine

## 2018-08-26 ENCOUNTER — Other Ambulatory Visit: Payer: Self-pay | Admitting: Internal Medicine

## 2018-08-26 MED ORDER — DM-GUAIFENESIN ER 30-600 MG PO TB12: 1.0000 | ORAL_TABLET | Freq: Two times a day (BID) | ORAL | 0 refills | Status: DC

## 2018-08-26 NOTE — Telephone Encounter (Signed)
   Reason for call:   I received a call from Ms. Corynn Helmers at 4:50 PM indicating that she is having cough with whitish sputum since Monday.   Pertinent Data:   Per patient she developed mild myalgia and body aches along with some congestion and cough on Monday.  Denies any fever or chills.  Body aches and congestion improve but continue to have cough with whitish sputum.  She was using Tylenol Cold and flu with not much help with cough.  Her fianc was sick with similar symptoms last week who has been recovered now.  She denies any recent travel but did attended a funeral over the weekend with a gathering of more than 50 people, she is not aware of any people sick in them.   Assessment / Plan / Recommendations:   Advised patient to use Mucinex DM, prescription was sent to her pharmacy at [facility], keep herself well-hydrated and away from other people.  If her symptoms continue to get worse or if she develops fever she should contact us or go to emergency room.  As always, pt is advised that if symptoms worsen or new symptoms arise, they should go to an urgent care facility or to to ER for further evaluation.   Lorella Nimrod, MD   08/26/2018, 4:51 PM

## 2018-09-22 ENCOUNTER — Ambulatory Visit (INDEPENDENT_AMBULATORY_CARE_PROVIDER_SITE_OTHER): Payer: Medicare Other | Admitting: Internal Medicine

## 2018-09-22 ENCOUNTER — Encounter: Payer: Self-pay | Admitting: Internal Medicine

## 2018-09-22 ENCOUNTER — Ambulatory Visit: Payer: Medicare Other | Admitting: Internal Medicine

## 2018-09-22 ENCOUNTER — Other Ambulatory Visit: Payer: Self-pay

## 2018-09-22 DIAGNOSIS — K219 Gastro-esophageal reflux disease without esophagitis: Secondary | ICD-10-CM | POA: Diagnosis not present

## 2018-09-22 DIAGNOSIS — R053 Chronic cough: Secondary | ICD-10-CM

## 2018-09-22 DIAGNOSIS — R05 Cough: Secondary | ICD-10-CM | POA: Diagnosis not present

## 2018-09-22 MED ORDER — FLUTICASONE PROPIONATE 50 MCG/ACT NA SUSP: 1.0000 | Freq: Every day | NASAL | 2 refills | Status: DC

## 2018-09-22 MED ORDER — DM-GUAIFENESIN ER 30-600 MG PO TB12: 1.0000 | ORAL_TABLET | Freq: Two times a day (BID) | ORAL | 0 refills | Status: DC

## 2018-09-22 MED ORDER — PANTOPRAZOLE SODIUM 40 MG PO TBEC: 40.0000 mg | DELAYED_RELEASE_TABLET | Freq: Every day | ORAL | 1 refills | Status: DC

## 2018-09-22 MED ORDER — CETIRIZINE HCL 10 MG PO CAPS: 10.0000 mg | ORAL_CAPSULE | Freq: Every day | ORAL | 0 refills | Status: DC

## 2018-09-22 NOTE — Progress Notes (Signed)
Medicine attending: Medical history, presenting problems,  and medications, reviewed with resident physician Dr Guadlupe Spanish on the day of the patient telephone consultation and I concur with his evaluation and management plan. Intermittent cough x 1 year. Neg CXR  10/18. D/C smoking x 18 years. Some improvement on PPI. Current seasonal allergy sxs. Dr Viona Gilmore will refill PPI & prescribe an antihistamine.

## 2018-09-22 NOTE — Assessment & Plan Note (Signed)
   Chronic cough.  Goes away and then comes back.  Productive cough mainly.  No fevers or chills.  No weight loss.  She is smoking marijuana but not cigarettes (quit 20 years ago).  Not on ace or arb. Mucinex helped.  Not taking zyrtec, has nasal saline but no flonase.  Does have watery eyes and runny nose in the mornings.  Worse when around grass or cigarette smoke.  Coughing every day. Seems to be also worsened by his GERD.   P: Conservative therapy for one week refill PPI, order flonase and zyrtec, then would obtain chest x-ray pt will call back and report resolution or persistence of cough  As always, pt is advised that if symptoms worsen or new symptoms arise, they should go to an urgent care facility or to to ER for further evaluation.

## 2018-09-22 NOTE — Assessment & Plan Note (Signed)
Pt requires refills on medications with associated diagnosis above.  Reviewed disease process and find this medication to be necessary, will not change dose or alter current therapy. 

## 2018-09-22 NOTE — Progress Notes (Signed)
   [facility] Telephone Encounter  Reason for call:   This telephone encounter was created for [name] on 09/22/2018 for the following purpose/cc chronic cough.   Pertinent Data:   HTN, GERD, seasonal allergies, osteoarthritis ROS: Pulmonary: pt denies increased work of breathing, shortness of breath,  Cardiac: pt denies palpitations, chest pain,   Abdominal: pt denies abdominal pain, nausea, vomiting, or diarrhea   Assessment / Plan / Recommendations:   Chronic cough.  Goes away and then comes back.  Productive cough mainly.  No fevers or chills.  No weight loss.  She is smoking marijuana but not cigarettes (quit 20 years ago).  Not on ace or arb. Mucinex helped.  Not taking zyrtec, has nasal saline but no flonase.  Does have watery eyes and runny nose in the mornings.  Worse when around grass or cigarette smoke.  Coughing every day. Seems to be also worsened by his GERD.   Conservative therapy for one week refill PPI, order flonase and zyrtec, then would obtain chest x-ray pt will call back and report resolution or persistence of cough  As always, pt is advised that if symptoms worsen or new symptoms arise, they should go to an urgent care facility or to to ER for further evaluation.   Consent and Medical Decision Making:   Patient discussed with Dr. Beryle Beams  This is a telephone encounter between Theresa Kirk and Vickki Muff on 09/22/2018 for cough. The visit was conducted with the patient located at home and Vickki Muff at [facility]. The patient's identity was confirmed using their DOB and current address. The patient has consented to being evaluated through a telephone encounter and understands the associated risks (an examination cannot be done and the patient may need to come in for an appointment) / benefits (allows the patient to remain at home, decreasing exposure to coronavirus). I personally spent 14 minutes on medical discussion.

## 2018-10-28 ENCOUNTER — Other Ambulatory Visit: Payer: Self-pay

## 2018-10-28 ENCOUNTER — Telehealth: Payer: Self-pay

## 2018-10-28 ENCOUNTER — Ambulatory Visit (INDEPENDENT_AMBULATORY_CARE_PROVIDER_SITE_OTHER): Payer: Medicare Other | Admitting: Internal Medicine

## 2018-10-28 DIAGNOSIS — R05 Cough: Secondary | ICD-10-CM | POA: Diagnosis not present

## 2018-10-28 DIAGNOSIS — R053 Chronic cough: Secondary | ICD-10-CM

## 2018-10-28 NOTE — Telephone Encounter (Signed)
Please have her schedule another tele visit. She may need to be referred to a covid testing center.

## 2018-10-28 NOTE — Progress Notes (Signed)
   CC: evaluation of cough   This is a telephone encounter between Theresa Kirk and Theresa Kirk on 10/28/2018 for cough. The visit was conducted with the patient located at home and Theresa Kirk at [facility]. The patient's identity was confirmed using their DOB and current address. The patient has consented to being evaluated through a telephone encounter and understands the associated risks (an examination cannot be done and the patient may need to come in for an appointment) / benefits (allows the patient to remain at home, decreasing exposure to coronavirus). I personally spent 20 minutes on medical discussion.   HPI:  [name] is a 58 y.o. with PMH as below.   Please see A&P for assessment of the patient's acute and chronic medical conditions.   Past Medical History:  Diagnosis Date  . Alcohol abuse   . Arthritis   . Carpal tunnel syndrome   . Depression   . Fibromyalgia   . High cholesterol    Review of Systems:  Refer to history of present illness and assessment and plans for pertinent review of systems, all others reviewed and negative  Assessment & Plan:   Cough  Patient calls to discuss persistent productive cough initially evaluated 4/21 and thought to be related to GERD and seasonal allergies. The cough has been ongoing since December. She denies fever, chills, diarrhea. She does have lower abdominal pain for about the past month. She lost her sense of taste and smell once in December and once last week but it has been returning on its own. She denies shortness of breath. She also has wheezing which is worse first thing in the morning. She has tried using the proair inhaler but it has not worked to relieve her symptoms. She will be going to get a covid test tomorrow. The cough is worse at night and early in the morning. She is prescribed daily pantoprazole, she forgets to take this about twice per week. The protonix works well to control her heartburn. She takes cetirizine  intermittently for seasonal allergies and does not feel like this helped. She has tried flonase in the distant past but it did not offer relief.   Assessment: chronic cough with possible multifactorial etiology - untreated GERD and seasonal allergies with covid testing in process   - encouraged sinus irrigation prior to flonase use  - agree with covid testing, she will tell us the results of this test  - would obtain chest xray and methacholine challenge when covid results have returned   See Encounters Tab for problem based charting.  Patient discussed with Dr. Angelia Mould

## 2018-10-28 NOTE — Telephone Encounter (Signed)
Requesting to speak with a nurse about a cough. Please call back.

## 2018-10-28 NOTE — Telephone Encounter (Signed)
Thank you :)

## 2018-10-28 NOTE — Telephone Encounter (Signed)
Called pt - informed/explained tele health visit; she's agreeable. [facility] telehealth appt scheduled today @ 8347 PM; pt informed to have her phone with her. Also info given on testing site at [employer].

## 2018-10-28 NOTE — Telephone Encounter (Signed)
Call from pt - stated she continues to have a cough. She had a telehealth visit on 4/21 and did what was suggested but cough is not any better.. Stated her fiance's co-worker has tested positive for covid-19; he's planning to be tested tomorrow.  NURSING TRIAGE NOTE FOR RESPIRATORY SYMPTOMS  Do you have a fever? No  Do you have a cough? Yes - productive; mucous yellowish-green to white. Do you have shortness of breath more than normal?  Stated slightly. Do you have chest pain?No Are you able to eat and drink normally? Stated loss of taste and smell Have you seen a physician for these symptoms? No  Action information sent to physician to conduct a phone appoitment

## 2018-10-29 ENCOUNTER — Encounter: Payer: Self-pay | Admitting: Internal Medicine

## 2018-10-29 NOTE — Assessment & Plan Note (Signed)
Patient calls to discuss persistent productive cough initially evaluated 4/21 and thought to be related to GERD and seasonal allergies. The cough has been ongoing since December. She denies fever, chills, diarrhea. She does have lower abdominal pain for about the past month. She lost her sense of taste and smell once in December and once last week but it has been returning on its own. She denies shortness of breath. She also has wheezing which is worse first thing in the morning. She has tried using the proair inhaler but it has not worked to relieve her symptoms. She will be going to get a covid test tomorrow. The cough is worse at night and early in the morning. She is prescribed daily pantoprazole, she forgets to take this about twice per week. The protonix works well to control her heartburn. She takes cetirizine intermittently for seasonal allergies and does not feel like this helped. She has tried flonase in the distant past but it did not offer relief.   Assessment: chronic cough with possible multifactorial etiology - untreated GERD and seasonal allergies with covid testing in process   - encouraged sinus irrigation prior to flonase use  - agree with covid testing, she will tell us the results of this test  - would obtain chest xray and methacholine challenge when covid results have returned

## 2018-11-03 NOTE — Progress Notes (Signed)
Internal Medicine Clinic Attending  Case discussed with Dr. Blum at the time of the visit.  We reviewed the resident's history and exam and pertinent patient test results.  I agree with the assessment, diagnosis, and plan of care documented in the resident's note. 

## 2019-02-15 ENCOUNTER — Ambulatory Visit: Payer: Medicare Other

## 2019-02-16 ENCOUNTER — Ambulatory Visit (INDEPENDENT_AMBULATORY_CARE_PROVIDER_SITE_OTHER): Payer: Medicare Other | Admitting: *Deleted

## 2019-02-16 ENCOUNTER — Other Ambulatory Visit: Payer: Self-pay

## 2019-02-16 DIAGNOSIS — Z23 Encounter for immunization: Secondary | ICD-10-CM

## 2019-03-18 ENCOUNTER — Other Ambulatory Visit: Payer: Self-pay | Admitting: Internal Medicine

## 2019-03-18 DIAGNOSIS — R05 Cough: Secondary | ICD-10-CM

## 2019-03-18 DIAGNOSIS — R053 Chronic cough: Secondary | ICD-10-CM

## 2019-03-18 NOTE — Telephone Encounter (Signed)
Next appt scheduled 11/17 with PCP. 

## 2019-04-20 ENCOUNTER — Other Ambulatory Visit: Payer: Self-pay

## 2019-04-20 ENCOUNTER — Ambulatory Visit (INDEPENDENT_AMBULATORY_CARE_PROVIDER_SITE_OTHER): Payer: Medicare Other | Admitting: Internal Medicine

## 2019-04-20 VITALS — BP 126/77 | HR 57 | Temp 97.7°F | Ht 65.0 in | Wt 202.5 lb

## 2019-04-20 DIAGNOSIS — R3914 Feeling of incomplete bladder emptying: Secondary | ICD-10-CM

## 2019-04-20 DIAGNOSIS — Z90711 Acquired absence of uterus with remaining cervical stump: Secondary | ICD-10-CM

## 2019-04-20 DIAGNOSIS — F339 Major depressive disorder, recurrent, unspecified: Secondary | ICD-10-CM | POA: Diagnosis not present

## 2019-04-20 DIAGNOSIS — F32A Depression, unspecified: Secondary | ICD-10-CM

## 2019-04-20 DIAGNOSIS — D225 Melanocytic nevi of trunk: Secondary | ICD-10-CM

## 2019-04-20 DIAGNOSIS — R7303 Prediabetes: Secondary | ICD-10-CM

## 2019-04-20 DIAGNOSIS — M25531 Pain in right wrist: Secondary | ICD-10-CM | POA: Diagnosis not present

## 2019-04-20 DIAGNOSIS — R232 Flushing: Secondary | ICD-10-CM

## 2019-04-20 DIAGNOSIS — D2271 Melanocytic nevi of right lower limb, including hip: Secondary | ICD-10-CM

## 2019-04-20 DIAGNOSIS — F329 Major depressive disorder, single episode, unspecified: Secondary | ICD-10-CM

## 2019-04-20 DIAGNOSIS — R05 Cough: Secondary | ICD-10-CM

## 2019-04-20 DIAGNOSIS — K219 Gastro-esophageal reflux disease without esophagitis: Secondary | ICD-10-CM

## 2019-04-20 DIAGNOSIS — Z79899 Other long term (current) drug therapy: Secondary | ICD-10-CM

## 2019-04-20 DIAGNOSIS — R053 Chronic cough: Secondary | ICD-10-CM

## 2019-04-20 DIAGNOSIS — D229 Melanocytic nevi, unspecified: Secondary | ICD-10-CM | POA: Insufficient documentation

## 2019-04-20 LAB — POCT GLYCOSYLATED HEMOGLOBIN (HGB A1C): Hemoglobin A1C: 6.2 % — AB (ref 4.0–5.6)

## 2019-04-20 LAB — GLUCOSE, CAPILLARY: Glucose-Capillary: 94 mg/dL (ref 70–99)

## 2019-04-20 MED ORDER — CITALOPRAM HYDROBROMIDE 20 MG PO TABS: 20.0000 mg | ORAL_TABLET | Freq: Every day | ORAL | 2 refills | Status: DC

## 2019-04-20 MED ORDER — PANTOPRAZOLE SODIUM 40 MG PO TBEC: DELAYED_RELEASE_TABLET | ORAL | 1 refills | Status: DC

## 2019-04-20 MED ORDER — FLUTICASONE PROPIONATE 50 MCG/ACT NA SUSP: 1.0000 | Freq: Every day | NASAL | 2 refills | Status: DC

## 2019-04-20 NOTE — Assessment & Plan Note (Signed)
-  Patient has a history of chronic cough but states that the symptoms have now improved -She does however complain of intermittent sore throat especially when laying down -I suspect that the symptoms are secondary to either GERD or PND -Patient stopped taking her Flonase and cetirizine -I advised her to take these medications daily for the next couple of days to see if this will help with the sore throat -Patient denies any fevers or chills, no myalgias and states that the sore throat is only intermittent and she currently does not have any symptoms at rest -On exam, patient had no cervical lymphadenopathy and she had no exudates or erythema over her pharynx -Patient states that if this does not improve she would consider going to the ED.  Explained to the patient that her symptoms are only intermittent and is unlikely that this is infectious in nature and if her symptoms continue to recur we could refer to ENT for follow-up

## 2019-04-20 NOTE — Assessment & Plan Note (Signed)
-  Patient states that her depression is not well controlled -She states that she stopped the all her medications as she ran out and has not followed up with [facility] in a long time -I have started her on Celexa 20 mg for her hot flashes but this will likely help with her depression as well -Encouraged the patient to follow-up with [facility] and to let them know that she is currently on Celexa. -Patient PHQ-9 score is 16 today down from 18 at her last visit.  We will continue to monitor closely -No further work-up at this time.

## 2019-04-20 NOTE — Assessment & Plan Note (Signed)
-  This problem is chronic -We will continue with pantoprazole 40 mg daily for now -Patient does complain of intermittent episodes of abdominal pain and bloating especially after eating certain foods like beans -Explained to patient is likely secondary to gas and advised to take Alka-Seltzer with simethicone if she experiences these symptoms. -Patient denies any abdominal pain or bloating currently. -No further work-up at this time -If patient has recurrent symptoms she will follow-up with [place]

## 2019-04-20 NOTE — Assessment & Plan Note (Signed)
-  Patient is noted multiple new moles over the last couple of months and is concerned about them -Patient states that she noted some small areas of depigmentation over the underside of her breast as well as right upper thigh as well as multiple moles over her lower extremities -The moles appear to be regular in shape and small in size and have no concerning features currently. -However, patient will need follow-up with dermatology for full skin check.  We will put this referral in today -Patient also complains of a knot over the back of her neck which appears to be muscular in nature.  No lesions noted currently.  Patient states that she will bring this up with her dermatologist as well -No further work-up at this time

## 2019-04-20 NOTE — Progress Notes (Signed)
   Subjective:    Patient ID: Theresa Kirk, female    DOB: 1960-12-02, 58 y.o.   MRN: [id number]  HPI  I have seen and examined this patient.  Patient is here for routine follow-up of her depression and prediabetes.  Patient states that she has multiple issues she would like to discuss today.  She states that she is developing moles all over her body and will need to follow-up with dermatology for this.  Patient also complains of bloating and stomach pain after eating certain foods like beans.  Patient also complains of intermittent sore throat especially on laying down.  Patient also complained of hot flashes which occur intermittently.  Patient states that she is compliant with most of her medications but has stopped taking her depression medications as she has not followed up with [facility].  Review of Systems  Constitutional: Negative.  Negative for fever.  HENT: Positive for sore throat.   Respiratory: Negative.   Cardiovascular: Negative.   Gastrointestinal: Positive for abdominal distention and abdominal pain.  Musculoskeletal: Negative for back pain and myalgias.       Complains of intermittent right wrist pain  Neurological: Negative.   Psychiatric/Behavioral: Negative.        Objective:   Physical Exam Constitutional:      Appearance: Normal appearance.  HENT:     Head: Normocephalic and atraumatic.     Mouth/Throat:     Mouth: Mucous membranes are moist.     Pharynx: Oropharynx is clear. No oropharyngeal exudate.  Neck:     Musculoskeletal: Neck supple.  Cardiovascular:     Rate and Rhythm: Normal rate and regular rhythm.     Heart sounds: Normal heart sounds.  Pulmonary:     Effort: Pulmonary effort is normal.     Breath sounds: Normal breath sounds. No wheezing or rales.  Abdominal:     General: Bowel sounds are normal. There is no distension.     Palpations: Abdomen is soft.     Tenderness: There is no abdominal tenderness.  Musculoskeletal:        General:  No swelling or tenderness.  Lymphadenopathy:     Cervical: No cervical adenopathy.  Skin:    General: Skin is warm and dry.     Comments: Patient notes multiple new moles over both her legs as well as her right upper thigh and on the underside of both her breasts.  She does have some areas of depigmentation over the underside of both breasts and her right upper thigh as well as small moles over both her legs.  They appear to be regular in shape and small and have no bleeding or other concerning features  Neurological:     General: No focal deficit present.     Mental Status: She is alert and oriented to person, place, and time.  Psychiatric:        Mood and Affect: Mood normal.        Behavior: Behavior normal.           Assessment & Plan:  Please see problem based charting for assessment and plan:

## 2019-04-20 NOTE — Patient Instructions (Signed)
-  Was a pleasure seeing you today -I put in referral to dermatology for you so that you can get a complete skin check for the moles that you have noticed -Please follow-up with [facility] for your depression.  I have started you on Celexa 20 mg which will help you with your hot flashes but will also help with your depression.  Please let your psychiatrist at [facility] noted that I had started you on this medication -Please avoid food that causes bloating.  If you do notice bloating symptoms please take Alka-Seltzer with simethicone over-the-counter.  This is available as chewable tablets and will likely help with your bloating -For your sore throat I suspect is secondary to postnasal drip from you being off your cetirizine and Flonase.  Please resume these medications.  If your sore throat worsens or you start developing fevers please call us and follow-up with [place] -Please call me with any questions or concerns

## 2019-04-20 NOTE — Assessment & Plan Note (Signed)
-  Patient states that she gets intermittent pain over her right wrist and has some numbness over the tips of all her fingers of both hands -Patient states the Voltaren gel does not help when she gets the pain but that the pain does not occur often -She currently denies any pain over her right wrist and her strength is intact -She has a negative Tinel's and Phalen sign -No further work-up at this time.  Would consider imaging of her right wrist if this pain continues and possible referral to sports medicine

## 2019-04-20 NOTE — Assessment & Plan Note (Signed)
-  This problem is chronic and stable -Patient's A1c is 6.2 today up from 6 at her last visit -We will continue to monitor closely for now -We will follow-up BMP

## 2019-04-20 NOTE — Assessment & Plan Note (Signed)
-  This problem is chronic and stable -Patient states that he follows up with Dr. Gloriann Loan at [facility] urology for this -She states that she is on some medication for this but does not remember its name -She will continue to follow-up with urology as an outpatient -No further work-up at this time

## 2019-04-20 NOTE — Assessment & Plan Note (Signed)
-  Patient complains of intermittent episodes of feeling hot with sweats despite having the fan on or the house being cold. -Patient states that she had a partial hysterectomy years ago but her ovaries were not removed -Patient symptoms appear to be secondary to hot flashes -We will start the patient on citalopram 20 mg daily for this -Explained to patient that it would take a week or 2 for this to start helping.  If patient has recurrent symptoms she will follow-up with [place] -No further work-up at this time

## 2019-04-21 ENCOUNTER — Telehealth: Payer: Self-pay | Admitting: Internal Medicine

## 2019-04-21 LAB — BMP8+ANION GAP
Anion Gap: 14 mmol/L (ref 10.0–18.0)
BUN/Creatinine Ratio: 14 (ref 9–23)
BUN: 17 mg/dL (ref 6–24)
CO2: 22 mmol/L (ref 20–29)
Calcium: 9.3 mg/dL (ref 8.7–10.2)
Chloride: 104 mmol/L (ref 96–106)
Creatinine, Ser: 1.21 mg/dL — ABNORMAL HIGH (ref 0.57–1.00)
GFR calc Af Amer: 57 mL/min/{1.73_m2} — ABNORMAL LOW (ref >59)
GFR calc non Af Amer: 50 mL/min/{1.73_m2} — ABNORMAL LOW (ref >59)
Glucose: 92 mg/dL (ref 65–99)
Potassium: 4.4 mmol/L (ref 3.5–5.2)
Sodium: 140 mmol/L (ref 134–144)

## 2019-04-21 NOTE — Telephone Encounter (Signed)
I called the patient discussed the results of her blood work with her.  Patient was noted to have a mildly elevated A1c at 6.2 which is still in the prediabetic range.  Her previous A1c was at 6.  I explained the patient that we will need to continue to monitor this.  Patient will need to follow a good diet as well as exercise and try to lose some weight.  Patient was noted to have a mildly elevated creatinine of 1.21 on her BMP.  I suspect that this may be secondary to some mild dehydration given that she has had some abdominal pain and bloating recently and has had some decreased appetite.  She has had an elevated creatinine in the past up to 1.27 which then normalized.  We will continue to monitor her BMP and repeat this at her follow-up visit.  No further work-up at this time.  Patient states that she has no complaints currently and denies any abdominal pain or bloating.  She states that she will call if she has any questions or concerns.  Patient expresses understanding and is in agreement with plan.

## 2019-04-28 DIAGNOSIS — L818 Other specified disorders of pigmentation: Secondary | ICD-10-CM | POA: Diagnosis not present

## 2019-04-28 DIAGNOSIS — L821 Other seborrheic keratosis: Secondary | ICD-10-CM | POA: Diagnosis not present

## 2019-04-28 DIAGNOSIS — K117 Disturbances of salivary secretion: Secondary | ICD-10-CM | POA: Diagnosis not present

## 2019-05-11 ENCOUNTER — Other Ambulatory Visit: Payer: Self-pay | Admitting: Internal Medicine

## 2019-05-11 DIAGNOSIS — Z1231 Encounter for screening mammogram for malignant neoplasm of breast: Secondary | ICD-10-CM

## 2019-06-09 DIAGNOSIS — M722 Plantar fascial fibromatosis: Secondary | ICD-10-CM | POA: Diagnosis not present

## 2019-06-09 DIAGNOSIS — G2581 Restless legs syndrome: Secondary | ICD-10-CM | POA: Diagnosis not present

## 2019-06-24 ENCOUNTER — Other Ambulatory Visit: Payer: Self-pay

## 2019-06-24 ENCOUNTER — Ambulatory Visit
Admission: RE | Admit: 2019-06-24 | Discharge: 2019-06-24 | Disposition: A | Discharge status: Home / Self Care | Payer: Medicare Other | Source: Ambulatory Visit | Attending: Internal Medicine | Admitting: Internal Medicine

## 2019-06-24 DIAGNOSIS — Z1231 Encounter for screening mammogram for malignant neoplasm of breast: Secondary | ICD-10-CM

## 2019-06-25 ENCOUNTER — Other Ambulatory Visit: Payer: Self-pay | Admitting: Internal Medicine

## 2019-06-25 DIAGNOSIS — R928 Other abnormal and inconclusive findings on diagnostic imaging of breast: Secondary | ICD-10-CM

## 2019-07-05 ENCOUNTER — Other Ambulatory Visit: Payer: Self-pay

## 2019-07-05 ENCOUNTER — Ambulatory Visit: Payer: Medicare Other

## 2019-07-05 ENCOUNTER — Ambulatory Visit
Admission: RE | Admit: 2019-07-05 | Discharge: 2019-07-05 | Disposition: A | Discharge status: Home / Self Care | Payer: Medicare Other | Source: Ambulatory Visit | Attending: Internal Medicine | Admitting: Internal Medicine

## 2019-07-05 DIAGNOSIS — R922 Inconclusive mammogram: Secondary | ICD-10-CM | POA: Diagnosis not present

## 2019-07-05 DIAGNOSIS — R928 Other abnormal and inconclusive findings on diagnostic imaging of breast: Secondary | ICD-10-CM

## 2019-08-02 ENCOUNTER — Other Ambulatory Visit: Payer: Self-pay

## 2019-08-02 ENCOUNTER — Encounter: Payer: Self-pay | Admitting: Internal Medicine

## 2019-08-02 ENCOUNTER — Ambulatory Visit (INDEPENDENT_AMBULATORY_CARE_PROVIDER_SITE_OTHER): Payer: Medicare Other | Admitting: Internal Medicine

## 2019-08-02 DIAGNOSIS — M5412 Radiculopathy, cervical region: Secondary | ICD-10-CM | POA: Diagnosis not present

## 2019-08-02 DIAGNOSIS — M542 Cervicalgia: Secondary | ICD-10-CM

## 2019-08-02 NOTE — Progress Notes (Signed)
Internal Medicine Clinic Attending  Case discussed with Dr. Harbrecht at the time of the visit.  We reviewed the resident's history and exam and pertinent patient test results.  I agree with the assessment, diagnosis, and plan of care documented in the resident's note.   

## 2019-08-02 NOTE — Progress Notes (Signed)
  [facility] Internal Medicine Residency Telephone Encounter Continuity Care Appointment  HPI:   This telephone encounter was created for [name] on 08/02/2019 for the following purpose/cc neck pain.   Past Medical History:  Past Medical History:  Diagnosis Date  . Alcohol abuse   . Arthritis   . Carpal tunnel syndrome   . Depression   . Fibromyalgia   . High cholesterol       ROS:   Negative except as per HPI.   Assessment / Plan / Recommendations:   Please see A&P under problem oriented charting for assessment of the patient's acute and chronic medical conditions.   As always, pt is advised that if symptoms worsen or new symptoms arise, they should go to an urgent care facility or to to ER for further evaluation.   Consent and Medical Decision Making:   Patient discussed with Dr. Lynnae January  This is a telephone encounter between Theresa Kirk and Theresa Kirk on 08/02/2019 for neck pain. The visit was conducted with the patient located at home and [employer] at [facility]. The patient's identity was confirmed using their DOB and current address. The patient has consented to being evaluated through a telephone encounter and understands the associated risks (an examination cannot be done and the patient may need to come in for an appointment) / benefits (allows the patient to remain at home, decreasing exposure to coronavirus). I personally spent 18 minutes on medical discussion.

## 2019-08-02 NOTE — Assessment & Plan Note (Signed)
Cervical radiculopathy: Neck pain with turning her head both left and right equally. She first noted this about one year prior. The pain appears to move from her neck though her ears (radiates?). She will hear the sound of water when she turns her head at times as well.  She denied fever, sore throat, sinus drainage, chills or vision changes, weight loss, immunosuppression or cancer. She endorsed numbness of the fingertips of her right hand more than the hand itself. She endorsed some numbness of the left hand and elbow as well. She does endorse occassionally dropping things such as a cooking pot but does not feel that this is out of the ordinary. She avoids lifting heavy objects. She is not certain which fingers feel numb but she is certain that the middle finger and the pointer fingers are involved. She denied that the numbness is related to her neck pain. The neck pain does not shoot into her arms/hands or cause spasms. She has used heat with relief. She has taken ibuprofen in the past but this caused GI upset. Aleve does provide some relief at moderate to high doses. Voltaren gel did not help.  She has degenerative changes noted on DG cervical spine form 2017.   I feel that her pain is likely related to osteoarthritis. I do not see any red flags today indicating a need for emergent Neurosurgery intervention but should note that my evaluation is extremely limited due to the modality of the appointment. An in person appointment is necessary to exam a patient with a possible neuropathy.   Plan: -Aleve 440mg  daily for 7 days -Patient is to schedule a return visit IN PERSON if this provides no relief for physical exam -Should consider ordering MRI if exam is remarkable only

## 2019-08-09 ENCOUNTER — Emergency Department (HOSPITAL_COMMUNITY)
Admission: EM | Admit: 2019-08-09 | Discharge: 2019-08-09 | Disposition: A | Discharge status: Home / Self Care | Payer: Medicare Other | Attending: Emergency Medicine | Admitting: Emergency Medicine

## 2019-08-09 ENCOUNTER — Other Ambulatory Visit: Payer: Self-pay

## 2019-08-09 ENCOUNTER — Emergency Department (HOSPITAL_COMMUNITY): Payer: Medicare Other

## 2019-08-09 ENCOUNTER — Encounter (HOSPITAL_COMMUNITY): Payer: Self-pay

## 2019-08-09 DIAGNOSIS — M542 Cervicalgia: Secondary | ICD-10-CM | POA: Diagnosis not present

## 2019-08-09 DIAGNOSIS — M7918 Myalgia, other site: Secondary | ICD-10-CM | POA: Insufficient documentation

## 2019-08-09 DIAGNOSIS — R591 Generalized enlarged lymph nodes: Secondary | ICD-10-CM | POA: Insufficient documentation

## 2019-08-09 DIAGNOSIS — Z79899 Other long term (current) drug therapy: Secondary | ICD-10-CM | POA: Diagnosis not present

## 2019-08-09 DIAGNOSIS — Z87891 Personal history of nicotine dependence: Secondary | ICD-10-CM | POA: Diagnosis not present

## 2019-08-09 DIAGNOSIS — R590 Localized enlarged lymph nodes: Secondary | ICD-10-CM | POA: Diagnosis not present

## 2019-08-09 MED ORDER — METHOCARBAMOL 500 MG PO TABS: 500.0000 mg | ORAL_TABLET | Freq: Two times a day (BID) | ORAL | 0 refills | Status: DC
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
## 2019-09-02 ENCOUNTER — Ambulatory Visit: Payer: Medicare Other | Attending: Internal Medicine

## 2019-09-02 DIAGNOSIS — Z23 Encounter for immunization: Secondary | ICD-10-CM

## 2019-09-02 NOTE — Progress Notes (Signed)
   Covid-19 Vaccination Clinic  Name:  Theresa Kirk    MRN: [id number] DOB: 11-05-1960  09/02/2019  [name] was observed post Covid-19 immunization for 30 minutes based on pre-vaccination screening without incident. She was provided with Vaccine Information Sheet and instruction to access the V-Safe system.   [name] was instructed to call 911 with any severe reactions post vaccine: Marland Kitchen Difficulty breathing  . Swelling of face and throat  . A fast heartbeat  . A bad rash all over body  . Dizziness and weakness   Immunizations Administered    Name Date Dose VIS Date Route   Pfizer COVID-19 Vaccine 09/02/2019 11:16 AM 0.3 mL 05/14/2019 Intramuscular   Manufacturer: [employer], [employer]   Lot: [id number]   [place]: [id number]

## 2019-09-21 ENCOUNTER — Other Ambulatory Visit: Payer: Self-pay

## 2019-09-21 ENCOUNTER — Ambulatory Visit (INDEPENDENT_AMBULATORY_CARE_PROVIDER_SITE_OTHER): Payer: Medicare Other | Admitting: Internal Medicine

## 2019-09-21 ENCOUNTER — Encounter: Payer: Self-pay | Admitting: Internal Medicine

## 2019-09-21 VITALS — BP 130/69 | HR 66 | Temp 98.2°F | Ht 65.0 in | Wt 206.3 lb

## 2019-09-21 DIAGNOSIS — R05 Cough: Secondary | ICD-10-CM

## 2019-09-21 DIAGNOSIS — F329 Major depressive disorder, single episode, unspecified: Secondary | ICD-10-CM

## 2019-09-21 DIAGNOSIS — R7303 Prediabetes: Secondary | ICD-10-CM | POA: Diagnosis not present

## 2019-09-21 DIAGNOSIS — H9313 Tinnitus, bilateral: Secondary | ICD-10-CM

## 2019-09-21 DIAGNOSIS — K219 Gastro-esophageal reflux disease without esophagitis: Secondary | ICD-10-CM

## 2019-09-21 DIAGNOSIS — M542 Cervicalgia: Secondary | ICD-10-CM | POA: Diagnosis not present

## 2019-09-21 DIAGNOSIS — Z79899 Other long term (current) drug therapy: Secondary | ICD-10-CM

## 2019-09-21 DIAGNOSIS — Z0000 Encounter for general adult medical examination without abnormal findings: Secondary | ICD-10-CM

## 2019-09-21 DIAGNOSIS — R053 Chronic cough: Secondary | ICD-10-CM

## 2019-09-21 DIAGNOSIS — F32A Depression, unspecified: Secondary | ICD-10-CM

## 2019-09-21 DIAGNOSIS — R232 Flushing: Secondary | ICD-10-CM

## 2019-09-21 DIAGNOSIS — R590 Localized enlarged lymph nodes: Secondary | ICD-10-CM

## 2019-09-21 DIAGNOSIS — H9319 Tinnitus, unspecified ear: Secondary | ICD-10-CM | POA: Insufficient documentation

## 2019-09-21 HISTORY — DX: Localized enlarged lymph nodes: R59.0

## 2019-09-21 MED ORDER — CITALOPRAM HYDROBROMIDE 20 MG PO TABS: 20.0000 mg | ORAL_TABLET | Freq: Every day | ORAL | 2 refills | Status: DC

## 2019-09-21 MED ORDER — CYCLOBENZAPRINE HCL 5 MG PO TABS: 5.0000 mg | ORAL_TABLET | Freq: Three times a day (TID) | ORAL | 0 refills | Status: DC | PRN

## 2019-09-21 NOTE — Assessment & Plan Note (Signed)
-  This problem is chronic and stable -Patient states that her hot flashes have improved over the last year and that she has run out of her Celexa but that the Celexa was helping her with her hot flashes.  Still has intermittent episodes of diaphoresis but states that overall her symptoms are much improved -We will refill her Celexa for her today.  Patient to let her psychiatrist know that she is taking Celexa for hot flashes as well as her depression. -No further work-up at this time.

## 2019-09-21 NOTE — Assessment & Plan Note (Signed)
-  Patient states that she has ringing in both ears over the last year which is progressively worsened and is constant -On exam, her tympanic membranes appear intact and she does not appear to have any excessive wax -We will refer patient to ENT for follow-up of her tinnitus -No further work-up at this time
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
## 2019-09-21 NOTE — Assessment & Plan Note (Addendum)
-  This problem is chronic and stable -Patient's last A1c was 6.2 -We will check a BMP today -No further work-up at this time  Addendum: -I called the patient regarding results of her BMP.  Her BMP was within normal limits except for mildly elevated blood glucose.  No further work-up at this time.  Patient expressed understanding and is in agreement with plan.

## 2019-09-21 NOTE — Patient Instructions (Signed)
-  It was a pleasure seeing you today.  Congratulations on the wedding!!  I hope you have a wonderful day! -I have refilled Celexa for you for the hot flashes and depression.  Please let your psychiatrist know that you are on this medication as well -I have put in an order for a repeat CT scan of your neck to follow-up on the enlarged lymph nodes.  They will call to schedule an appointment with you for this -I have also put in a referral to ENT.  They should call you from the ENT office to set up an appointment for you -We will also check some blood work on you today. -Please call me if you have any questions or concerns or if you need any refills

## 2019-09-21 NOTE — Assessment & Plan Note (Signed)
-  Patient states that her chronic cough has improved and is well controlled currently. -I suspect that her chronic cough is secondary to GERD or postnasal drip -She does have cervical lymphadenopathy on recent CT scan but given her symptoms are improving I suspect that her chronic cough is secondary to GERD/postnasal drip -No further work-up at this time.

## 2019-09-21 NOTE — Assessment & Plan Note (Signed)
-  Patient received first dose of her Pfizer COVID-19 vaccine and is due for her next dose soon
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
## 2019-09-21 NOTE — Assessment & Plan Note (Signed)
-  Patient states that her depression is better controlled today -Patient is on clonidine 0.2 mg twice daily as well as Wellbutrin 75 mg daily and Abilify 10 mg daily -Patient was also started on Celexa for hot flashes but this will likely help her depression as well -Patient to inform her psychiatrist she is taking this medication (patient goes to [facility] for follow-up of her depression) -Patient's PHQ 9 score is 15 today (was 16 in November) -We will continue to monitor the patient closely -No further work-up at this time
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
## 2019-09-22 LAB — BMP8+ANION GAP
Anion Gap: 14 mmol/L (ref 10.0–18.0)
BUN/Creatinine Ratio: 15 (ref 9–23)
BUN: 11 mg/dL (ref 6–24)
CO2: 23 mmol/L (ref 20–29)
Calcium: 9.6 mg/dL (ref 8.7–10.2)
Chloride: 104 mmol/L (ref 96–106)
Creatinine, Ser: 0.73 mg/dL (ref 0.57–1.00)
GFR calc Af Amer: 106 mL/min/{1.73_m2} (ref >59)
GFR calc non Af Amer: 92 mL/min/{1.73_m2} (ref >59)
Glucose: 111 mg/dL — ABNORMAL HIGH (ref 65–99)
Potassium: 4.6 mmol/L (ref 3.5–5.2)
Sodium: 141 mmol/L (ref 134–144)

## 2019-09-28 ENCOUNTER — Ambulatory Visit: Payer: Medicare Other | Attending: Internal Medicine

## 2019-09-28 DIAGNOSIS — Z23 Encounter for immunization: Secondary | ICD-10-CM

## 2019-09-28 NOTE — Progress Notes (Signed)
   Covid-19 Vaccination Clinic  Name:  Theresa Kirk    MRN: [id number] DOB: 01/08/1961  09/28/2019  [name] was observed post Covid-19 immunization for 15 minutes without incident. She was provided with Vaccine Information Sheet and instruction to access the V-Safe system.   [name] was instructed to call 911 with any severe reactions post vaccine: Marland Kitchen Difficulty breathing  . Swelling of face and throat  . A fast heartbeat  . A bad rash all over body  . Dizziness and weakness   Immunizations Administered    Name Date Dose VIS Date Route   Pfizer COVID-19 Vaccine 09/28/2019  8:10 AM 0.3 mL 07/28/2018 Intramuscular   Manufacturer: [place]   Lot: [id number]   [place]: [id number]

## 2019-10-07 ENCOUNTER — Other Ambulatory Visit: Payer: Self-pay

## 2019-10-07 ENCOUNTER — Ambulatory Visit (HOSPITAL_COMMUNITY)
Admission: RE | Admit: 2019-10-07 | Discharge: 2019-10-07 | Disposition: A | Discharge status: Home / Self Care | Payer: Medicare Other | Source: Ambulatory Visit | Attending: Internal Medicine | Admitting: Internal Medicine

## 2019-10-07 DIAGNOSIS — R590 Localized enlarged lymph nodes: Secondary | ICD-10-CM | POA: Diagnosis not present

## 2019-10-08 ENCOUNTER — Telehealth: Payer: Self-pay | Admitting: Internal Medicine

## 2019-10-08 NOTE — Telephone Encounter (Signed)
I called the patient discussed the results of her CT neck with her.  CT neck shows no significant cervical lymphadenopathy and no acute or focal lesions that would explain her difficulty swallowing.  When I spoke with the patient she said that her symptoms have improved.  However, she does complain of an issue with her foot and I asked her to come to [facility] to be evaluated for this.  The front desk will call the patient to make this appointment for her.  No further work-up at this time.

## 2019-10-13 ENCOUNTER — Encounter: Payer: Self-pay | Admitting: Internal Medicine

## 2019-10-13 ENCOUNTER — Ambulatory Visit (INDEPENDENT_AMBULATORY_CARE_PROVIDER_SITE_OTHER): Payer: Medicare Other | Admitting: Internal Medicine

## 2019-10-13 ENCOUNTER — Other Ambulatory Visit: Payer: Self-pay

## 2019-10-13 VITALS — BP 130/81 | HR 77 | Temp 97.7°F | Ht 65.5 in | Wt 205.4 lb

## 2019-10-13 DIAGNOSIS — L84 Corns and callosities: Secondary | ICD-10-CM | POA: Diagnosis not present

## 2019-10-13 DIAGNOSIS — M542 Cervicalgia: Secondary | ICD-10-CM | POA: Diagnosis not present

## 2019-10-13 MED ORDER — METHOCARBAMOL 500 MG PO TABS: 500.0000 mg | ORAL_TABLET | Freq: Two times a day (BID) | ORAL | 0 refills | Status: DC | PRN

## 2019-10-13 NOTE — Patient Instructions (Signed)
Dear [name],  Thank you for allowing [place] to provide your care today. Today we discussed your foot pain    I have made referral for foot doctor for you. I will call if any are abnormal.    Today we made no changes to your medications:    Please follow-up as needed.    Should you have any questions or concerns please call the internal medicine clinic at [phone].    Thank you for choosing [facility].   Foot Care, Adult Foot care is an important part of your health. Noticing and addressing any potential problems early is the best way to prevent future foot problems. How to care for your [place] your feet daily with warm water and mild soap. Do not use hot water. Then, pat your feet and the areas between your toes until they are completely dry. Do not soak your feet as this can dry your skin.  Trim your toenails straight across. Do not dig under them or around the cuticle. File the edges of your nails with an emery board or nail file.  On the skin on your feet and on dry, brittle nails, apply a moisturizing lotion or petroleum jelly that is unscented and does not contain alcohol. Do not apply lotion between your toes. Shoes and Socks   Wear clean socks or stockings every day. Make sure they are not too tight.  Wear shoes that fit properly and have enough cushioning. To break in new shoes, wear them for just a few hours a day. This prevents you from injuring your feet. Always look in your shoes before you put them on to be sure there are no objects inside. Wounds, Scrapes, Corns, and Calluses  Check your feet daily for blisters, cuts, and redness. If you cannot see the bottom of your feet, use a mirror or ask someone for help.  Do not cut corns or calluses. Do not try to remove them with medicine.  If you find a minor scrape, cut, or break in the skin on your feet, keep it and the skin around it clean and dry. These areas may be cleaned with mild soap  and water. Do not clean the area with peroxide, alcohol, or iodine.  If you have a wound, scrape, corn, or callus on your foot, look at it several times a day to make sure it is healing and is not infected. Check for: ? More redness, swelling, or pain. ? More fluid or blood. ? Warmth. ? Pus or a bad smell. General Instructions  Do not cross your legs. That may decrease the blood flow to your feet.  Do not use heating pads or hot water bottles on your feet. They may burn your skin. If you have lost feeling in your feet or legs, you may not know it is happening until it is too late.  Make sure your health care provider does a complete foot exam at least annually or more often if you have foot problems. If you have foot problems, report any cuts, sores, or bruises to your health care provider immediately. Contact a health care provider if:  You have a medical condition that increases your risk of infection and you have any cuts, sores, or bruises on your feet.  You have an injury that is not healing.  You notice redness on your legs or feet.  You feel burning or tingling in your legs or feet.  You have pain or cramps  in your legs or feet.  Your legs or feet are numb.  Your feet always feel cold.  You have pain around a toenail. Get help right away if:  You have a wound, scrape, corn, or callus on your foot and: ? You have more redness, swelling, or pain. ? You have more fluid or blood. ? Your wound, scrape, corn, or callus feels warm to the touch. ? You have pus or a bad smell coming from the wound, scrape, corn, or callus. ? You have a fever.  You have a red line going up your leg. This information is not intended to replace advice given to you by your health care provider. Make sure you discuss any questions you have with your health care provider. Document Revised: 06/06/2016 Document Reviewed: 10/27/2015 Elsevier Patient Education  [place].

## 2019-10-13 NOTE — Assessment & Plan Note (Signed)
Pt requires refills on medications with associated diagnosis above.  Reviewed disease process and find this medication to be necessary, will not change dose or alter current therapy. 

## 2019-10-13 NOTE — Assessment & Plan Note (Signed)
Ms.Kok is a 59 yo F w/ PMH of GERD, Depression, HLD presenting to [facility] w/ complaint of foot pain. She states this problem is chronic. Developed over time. Exacerbated by standing for prolonged periods. She mentions being busy due to social events lately and not giving her feet lot of time to rest. States that she developed painful callous on her R foot. She attempted to visit her podiatrist's office but was told the office was closed and she needs a new referral to establish care at a different podiatrist's office.  A/P On exam, noted to have large callous on distal phalanx of first toe. Will need to re-establish care with a different podiatry office for treatment.  - Referral for podiatry made - Advised to wear lose fitting shoes and keep pressure off feet when possible

## 2019-10-13 NOTE — Progress Notes (Signed)
   CC: Foot pain  HPI: [name] is a 59 y.o. with PMH listed below presenting with complaint of foot pain. Please see problem based assessment and plan for further details.  Past Medical History:  Diagnosis Date  . Alcohol abuse   . Arthritis   . Carpal tunnel syndrome   . Depression   . Fibromyalgia   . High cholesterol     Review of Systems: Review of Systems  Constitutional: Negative for chills and fever.  Cardiovascular: Negative for chest pain, palpitations and leg swelling.  Gastrointestinal: Negative for constipation, diarrhea, nausea and vomiting.  All other systems reviewed and are negative.    Physical Exam: Vitals:   10/13/19 1314  BP: 130/81  Pulse: 77  Temp: 97.7 F (36.5 C)  TempSrc: Oral  SpO2: 99%  Weight: 205 lb 6.4 oz (93.2 kg)  Height: 5' 5.5" (1.664 m)    Physical Exam  Constitutional: She appears well-developed and well-nourished. No distress.  HENT:  Mouth/Throat: Oropharynx is clear and moist.  Eyes: Conjunctivae are normal.  Cardiovascular: Normal rate, regular rhythm, normal heart sounds and intact distal pulses.  Respiratory: Effort normal and breath sounds normal. She has no wheezes. She has no rales.  GI: Soft. Bowel sounds are normal. She exhibits no distension. There is no abdominal tenderness.  Musculoskeletal:        General: No edema. Normal range of motion.  Skin: Skin is warm and dry.  Large callous on distal phalanx of first toe on R pictured below        Assessment & Plan:   Cervical muscle pain Pt requires refills on medications with associated diagnosis above.  Reviewed disease process and find this medication to be necessary, will not change dose or alter current therapy.  Callus of foot Theresa Kirk is a 59 yo F w/ PMH of GERD, Depression, HLD presenting to [facility] w/ complaint of foot pain. She states this problem is chronic. Developed over time. Exacerbated by standing for prolonged periods. She mentions being  busy due to social events lately and not giving her feet lot of time to rest. States that she developed painful callous on her R foot. She attempted to visit her podiatrist's office but was told the office was closed and she needs a new referral to establish care at a different podiatrist's office.  A/P On exam, noted to have large callous on distal phalanx of first toe. Will need to re-establish care with a different podiatry office for treatment.  - Referral for podiatry made - Advised to wear lose fitting shoes and keep pressure off feet when possible    Patient discussed with Dr. Dareen Piano   -Theresa Kirk, [facility] Pager: [phone]

## 2019-10-15 ENCOUNTER — Ambulatory Visit (INDEPENDENT_AMBULATORY_CARE_PROVIDER_SITE_OTHER): Payer: Medicare Other | Admitting: Podiatry

## 2019-10-15 ENCOUNTER — Other Ambulatory Visit: Payer: Self-pay

## 2019-10-15 ENCOUNTER — Encounter: Payer: Self-pay | Admitting: Podiatry

## 2019-10-15 VITALS — BP 138/84 | HR 70

## 2019-10-15 DIAGNOSIS — M205X1 Other deformities of toe(s) (acquired), right foot: Secondary | ICD-10-CM

## 2019-10-15 DIAGNOSIS — L84 Corns and callosities: Secondary | ICD-10-CM

## 2019-10-15 DIAGNOSIS — M205X2 Other deformities of toe(s) (acquired), left foot: Secondary | ICD-10-CM

## 2019-10-15 HISTORY — DX: Other deformities of toe(s) (acquired), left foot: M20.5X2

## 2019-10-15 HISTORY — DX: Other deformities of toe(s) (acquired), right foot: M20.5X1

## 2019-10-15 NOTE — Progress Notes (Signed)
This patient presents to the office with chief complaint of a severely painful callus on her right big toe.  She says that the calluses are present on both big toes but they became extremely painful this last weekend.  She says they are aggravated by activity and it limited her walking this past weekend.  She has provided no self treatment and presents the office today for an evaluation and treatment of her painful callus.  General Appearance  Alert, conversant and in no acute stress.  Vascular  Dorsalis pedis and posterior tibial  pulses are palpable  bilaterally.  Capillary return is within normal limits  bilaterally. Temperature is within normal limits  bilaterally.  Neurologic  Senn-Weinstein monofilament wire test within normal limits  bilaterally. Muscle power within normal limits bilaterally.  Nails Thick disfigured discolored nails with subungual debris  from hallux to fifth toes bilaterally. No evidence of bacterial infection or drainage bilaterally.  Orthopedic  No limitations of motion  feet .  No crepitus or effusions noted.  No bony pathology or digital deformities noted.  Functional hallux limitus 1st MPJ  B/L.  Skin  normotropic skin with no porokeratosis noted bilaterally.  No signs of infections or ulcers noted.  Pinch callus  Hallux  B/L.  Porokeratosis sub 5th metabase left foot.  Pinch callus/porokeratosis  B/L  IE.  Debride callus/porokeratosis with # 15 blade.  Discussed her pinch callus and explained due to FHL  B/L.  RTC prn   Gardiner Barefoot DPM

## 2019-10-18 NOTE — Progress Notes (Signed)
Internal Medicine Clinic Attending ° °Case discussed with Dr. Lee at the time of the visit.  We reviewed the resident’s history and exam and pertinent patient test results.  I agree with the assessment, diagnosis, and plan of care documented in the resident’s note.  °

## 2019-11-05 DIAGNOSIS — H0288A Meibomian gland dysfunction right eye, upper and lower eyelids: Secondary | ICD-10-CM | POA: Diagnosis not present

## 2019-11-05 DIAGNOSIS — H0288B Meibomian gland dysfunction left eye, upper and lower eyelids: Secondary | ICD-10-CM | POA: Diagnosis not present

## 2019-11-05 DIAGNOSIS — H43393 Other vitreous opacities, bilateral: Secondary | ICD-10-CM | POA: Diagnosis not present

## 2019-11-05 DIAGNOSIS — H04123 Dry eye syndrome of bilateral lacrimal glands: Secondary | ICD-10-CM | POA: Diagnosis not present

## 2019-11-05 DIAGNOSIS — H40013 Open angle with borderline findings, low risk, bilateral: Secondary | ICD-10-CM | POA: Diagnosis not present

## 2019-12-17 ENCOUNTER — Ambulatory Visit (HOSPITAL_COMMUNITY)
Admission: EM | Admit: 2019-12-17 | Discharge: 2019-12-17 | Disposition: A | Discharge status: Home / Self Care | Payer: Medicare Other | Attending: Urgent Care | Admitting: Urgent Care

## 2019-12-17 ENCOUNTER — Other Ambulatory Visit: Payer: Self-pay

## 2019-12-17 ENCOUNTER — Telehealth: Payer: Self-pay | Admitting: *Deleted

## 2019-12-17 ENCOUNTER — Encounter (HOSPITAL_COMMUNITY): Payer: Self-pay

## 2019-12-17 DIAGNOSIS — J069 Acute upper respiratory infection, unspecified: Secondary | ICD-10-CM | POA: Diagnosis not present

## 2019-12-17 DIAGNOSIS — R519 Headache, unspecified: Secondary | ICD-10-CM | POA: Insufficient documentation

## 2019-12-17 DIAGNOSIS — M1990 Unspecified osteoarthritis, unspecified site: Secondary | ICD-10-CM | POA: Diagnosis not present

## 2019-12-17 DIAGNOSIS — Z791 Long term (current) use of non-steroidal anti-inflammatories (NSAID): Secondary | ICD-10-CM | POA: Insufficient documentation

## 2019-12-17 DIAGNOSIS — E7800 Pure hypercholesterolemia, unspecified: Secondary | ICD-10-CM | POA: Insufficient documentation

## 2019-12-17 DIAGNOSIS — Z87891 Personal history of nicotine dependence: Secondary | ICD-10-CM | POA: Insufficient documentation

## 2019-12-17 DIAGNOSIS — Z20822 Contact with and (suspected) exposure to covid-19: Secondary | ICD-10-CM | POA: Insufficient documentation

## 2019-12-17 DIAGNOSIS — F329 Major depressive disorder, single episode, unspecified: Secondary | ICD-10-CM | POA: Insufficient documentation

## 2019-12-17 DIAGNOSIS — M797 Fibromyalgia: Secondary | ICD-10-CM | POA: Diagnosis not present

## 2019-12-17 DIAGNOSIS — Z886 Allergy status to analgesic agent status: Secondary | ICD-10-CM | POA: Diagnosis not present

## 2019-12-17 DIAGNOSIS — R0981 Nasal congestion: Secondary | ICD-10-CM | POA: Diagnosis not present

## 2019-12-17 DIAGNOSIS — Z79899 Other long term (current) drug therapy: Secondary | ICD-10-CM | POA: Insufficient documentation

## 2019-12-17 MED ORDER — BENZONATATE 100 MG PO CAPS: 100.0000 mg | ORAL_CAPSULE | Freq: Three times a day (TID) | ORAL | 0 refills | Status: DC | PRN

## 2019-12-17 MED ORDER — PREDNISONE 20 MG PO TABS: ORAL_TABLET | ORAL | 0 refills | Status: DC

## 2019-12-17 MED ORDER — ALBUTEROL SULFATE HFA 108 (90 BASE) MCG/ACT IN AERS: 1.0000 | INHALATION_SPRAY | Freq: Four times a day (QID) | RESPIRATORY_TRACT | 0 refills | Status: DC | PRN

## 2019-12-17 MED ORDER — PROMETHAZINE-DM 6.25-15 MG/5ML PO SYRP: 5.0000 mL | ORAL_SOLUTION | Freq: Every evening | ORAL | 0 refills | Status: DC | PRN

## 2019-12-17 NOTE — Telephone Encounter (Signed)
OFFICE CONTACTED (LVM FOR [facility]) FOR RECORDS FOR VISIT 11-04-2019 TO FAX TO [phone].

## 2019-12-17 NOTE — ED Provider Notes (Signed)
[facility]   MRN: [id number] DOB: 03-Jan-1961  Subjective:   Theresa Kirk is a 59 y.o. female presenting for 5 day hx of acute onset productive cough. Initially had shob but is now improved.  Has now developed sinus headaches, sinus congestion, postnasal drainage.  Cough is persisting.  Does not smoke cigarettes but does use marijuana at times.  She is supposed to take Zyrtec for persistent allergies but is not doing this.  Had Covid vaccination in March through April 2021.  No current facility-administered medications for this encounter.  Current Outpatient Medications:  .  ARIPiprazole (ABILIFY) 10 MG tablet, Take 1 tablet (10 mg total) by mouth daily., Disp: 30 tablet, Rfl: 0 .  buPROPion (WELLBUTRIN) 75 MG tablet, Take 1 tablet (75 mg total) by mouth daily., Disp: 30 tablet, Rfl: 0 .  Cetirizine HCl 10 MG CAPS, Take 1 capsule (10 mg total) by mouth daily., Disp: 30 capsule, Rfl: 0 .  citalopram (CELEXA) 20 MG tablet, Take 1 tablet (20 mg total) by mouth daily., Disp: 30 tablet, Rfl: 2 .  cloNIDine (CATAPRES) 0.2 MG tablet, Take 1 tablet (0.2 mg total) by mouth 2 (two) times daily., Disp: 60 tablet, Rfl: 0 .  diclofenac sodium (VOLTAREN) 1 % GEL, Apply 4 g topically 4 (four) times daily as needed., Disp: 100 g, Rfl: 1 .  fluticasone (FLONASE) 50 MCG/ACT nasal spray, Place 1 spray into both nostrils daily., Disp: 16 g, Rfl: 2 .  meloxicam (MOBIC) 15 MG tablet, Take 15 mg by mouth daily., Disp: , Rfl:  .  methocarbamol (ROBAXIN) 500 MG tablet, Take 1 tablet (500 mg total) by mouth 2 (two) times daily as needed for muscle spasms., Disp: 20 tablet, Rfl: 0 .  MYRBETRIQ 50 MG TB24 tablet, Take 50 mg by mouth daily., Disp: , Rfl:  .  pantoprazole (PROTONIX) 40 MG tablet, TAKE 1 TABLET BY MOUTH EVERY DAY(TAKE 30 MINS BEFORE MEALS), Disp: 90 tablet, Rfl: 1 .  PROAIR HFA 108 (90 Base) MCG/ACT inhaler, INHALE 1-2 PUFFS BY MOUTH EVERY 6 HOURS AS NEEDED FOR WHEEZING, Disp: 8.5 g, Rfl: 2 .   rOPINIRole (REQUIP) 0.5 MG tablet, Take 0.5 mg by mouth at bedtime., Disp: , Rfl:  .  traZODone (DESYREL) 100 MG tablet, Take 1 tablet (100 mg total) by mouth at bedtime as needed for sleep., Disp: 30 tablet, Rfl: 0   Allergies  Allergen Reactions  . Ibuprofen Nausea Only  . Iohexol     24 hr pre meds (did fine)    Past Medical History:  Diagnosis Date  . Alcohol abuse   . Arthritis   . Carpal tunnel syndrome   . Depression   . Fibromyalgia   . High cholesterol      Past Surgical History:  Procedure Laterality Date  . ABDOMINAL HYSTERECTOMY  2008   h/o uterine fibroids, ?partial hysterectomy per pt 04/13/12  . COLONOSCOPY    . FOOT SURGERY Right   . KNEE SURGERY      Family History  Problem Relation Age of Onset  . Diabetes Mother   . Hypertension Mother   . Stroke Mother   . Diabetes Sister   . Thyroid disease Sister   . Hypertension Sister   . Cancer Maternal Aunt        unknown  . Breast cancer Maternal Aunt     Social History   Tobacco Use  . Smoking status: Former Smoker    Quit date: 06/03/2000    Years since  quitting: 19.5  . Smokeless tobacco: Never Used  Substance Use Topics  . Alcohol use: Yes    Alcohol/week: 0.0 standard drinks    Comment: Sometimes.  . Drug use: Yes    Types: Marijuana    ROS Denies fever, active chest pain, active shortness of breath.  Objective:   Vitals: BP 127/77   Pulse 80   Temp 98.4 F (36.9 C) (Oral)   Resp 16   Ht 5\' 5"  (1.651 m)   Wt 206 lb (93.4 kg)   LMP 09/14/2006   SpO2 100%   BMI 34.28 kg/m   Physical Exam Constitutional:      General: She is not in acute distress.    Appearance: Normal appearance. She is well-developed. She is not ill-appearing, toxic-appearing or diaphoretic.  HENT:     Head: Normocephalic and atraumatic.     Right Ear: Tympanic membrane and ear canal normal. No drainage or tenderness. No middle ear effusion. Tympanic membrane is not erythematous.     Left Ear: Tympanic  membrane and ear canal normal. No drainage or tenderness.  No middle ear effusion. Tympanic membrane is not erythematous.     Nose: Nose normal. No congestion or rhinorrhea.     Mouth/Throat:     Mouth: Mucous membranes are moist. No oral lesions.     Pharynx: No pharyngeal swelling, oropharyngeal exudate, posterior oropharyngeal erythema or uvula swelling.     Tonsils: No tonsillar exudate or tonsillar abscesses.  Eyes:     General: No scleral icterus.       Right eye: No discharge.        Left eye: No discharge.     Extraocular Movements: Extraocular movements intact.     Right eye: Normal extraocular motion.     Left eye: Normal extraocular motion.     Conjunctiva/sclera: Conjunctivae normal.     Pupils: Pupils are equal, round, and reactive to light.  Cardiovascular:     Rate and Rhythm: Normal rate and regular rhythm.     Pulses: Normal pulses.     Heart sounds: Normal heart sounds. No murmur heard.  No friction rub. No gallop.   Pulmonary:     Effort: Pulmonary effort is normal. No respiratory distress.     Breath sounds: Normal breath sounds. No stridor. No wheezing, rhonchi or rales.  Musculoskeletal:     Cervical back: Normal range of motion and neck supple.  Lymphadenopathy:     Cervical: No cervical adenopathy.  Skin:    General: Skin is warm and dry.     Findings: No rash.  Neurological:     General: No focal deficit present.     Mental Status: She is alert and oriented to person, place, and time.  Psychiatric:        Mood and Affect: Mood normal.        Behavior: Behavior normal.        Thought Content: Thought content normal.        Judgment: Judgment normal.     Assessment and Plan :   PDMP not reviewed this encounter.  1. Viral URI with cough   2. Sinus headache   3. Sinus congestion     COVID-19 testing pending.  Recommended prednisone course given history of significant allergies.  Use supportive care otherwise.  Counseled patient on potential for  adverse effects with medications prescribed/recommended today, ER and return-to-clinic precautions discussed, patient verbalized understanding.    Jaynee Eagles, [place] 12/17/19 [phone]

## 2019-12-17 NOTE — ED Triage Notes (Signed)
Pt c/o productive cough w/yellow mucous, nasal drainage, chillsx5 days. Pt denies SOB. Pt has non labored breathing.

## 2019-12-17 NOTE — Discharge Instructions (Signed)

## 2019-12-18 LAB — SARS CORONAVIRUS 2 (TAT 6-24 HRS): SARS Coronavirus 2: NEGATIVE

## 2019-12-29 ENCOUNTER — Other Ambulatory Visit: Payer: Self-pay | Admitting: Internal Medicine

## 2019-12-29 DIAGNOSIS — R232 Flushing: Secondary | ICD-10-CM

## 2020-01-25 DIAGNOSIS — H903 Sensorineural hearing loss, bilateral: Secondary | ICD-10-CM | POA: Diagnosis not present

## 2020-01-25 DIAGNOSIS — K219 Gastro-esophageal reflux disease without esophagitis: Secondary | ICD-10-CM | POA: Diagnosis not present

## 2020-01-25 DIAGNOSIS — H9313 Tinnitus, bilateral: Secondary | ICD-10-CM | POA: Diagnosis not present

## 2020-02-27 ENCOUNTER — Emergency Department (HOSPITAL_COMMUNITY)
Admission: EM | Admit: 2020-02-27 | Discharge: 2020-02-27 | Disposition: A | Discharge status: Home / Self Care | Payer: Medicare Other | Attending: Emergency Medicine | Admitting: Emergency Medicine

## 2020-02-27 ENCOUNTER — Emergency Department (HOSPITAL_COMMUNITY): Payer: Medicare Other

## 2020-02-27 ENCOUNTER — Other Ambulatory Visit: Payer: Self-pay

## 2020-02-27 ENCOUNTER — Encounter (HOSPITAL_COMMUNITY): Payer: Self-pay | Admitting: Emergency Medicine

## 2020-02-27 DIAGNOSIS — Y9389 Activity, other specified: Secondary | ICD-10-CM | POA: Insufficient documentation

## 2020-02-27 DIAGNOSIS — Z041 Encounter for examination and observation following transport accident: Secondary | ICD-10-CM | POA: Insufficient documentation

## 2020-02-27 DIAGNOSIS — M545 Low back pain: Secondary | ICD-10-CM | POA: Diagnosis not present

## 2020-02-27 DIAGNOSIS — R109 Unspecified abdominal pain: Secondary | ICD-10-CM | POA: Diagnosis not present

## 2020-02-27 DIAGNOSIS — R6889 Other general symptoms and signs: Secondary | ICD-10-CM | POA: Diagnosis not present

## 2020-02-27 DIAGNOSIS — Z743 Need for continuous supervision: Secondary | ICD-10-CM | POA: Diagnosis not present

## 2020-02-27 DIAGNOSIS — Z87891 Personal history of nicotine dependence: Secondary | ICD-10-CM | POA: Insufficient documentation

## 2020-02-27 DIAGNOSIS — R52 Pain, unspecified: Secondary | ICD-10-CM | POA: Diagnosis not present

## 2020-02-27 DIAGNOSIS — S79911A Unspecified injury of right hip, initial encounter: Secondary | ICD-10-CM | POA: Diagnosis not present

## 2020-02-27 DIAGNOSIS — Y92410 Unspecified street and highway as the place of occurrence of the external cause: Secondary | ICD-10-CM | POA: Insufficient documentation

## 2020-02-27 DIAGNOSIS — S3993XA Unspecified injury of pelvis, initial encounter: Secondary | ICD-10-CM | POA: Diagnosis not present

## 2020-02-27 DIAGNOSIS — S3991XA Unspecified injury of abdomen, initial encounter: Secondary | ICD-10-CM | POA: Diagnosis not present

## 2020-02-27 DIAGNOSIS — M25551 Pain in right hip: Secondary | ICD-10-CM | POA: Insufficient documentation

## 2020-02-27 DIAGNOSIS — S3992XA Unspecified injury of lower back, initial encounter: Secondary | ICD-10-CM | POA: Diagnosis not present

## 2020-02-27 LAB — CBC WITH DIFFERENTIAL/PLATELET
Abs Immature Granulocytes: 0.02 10*3/uL (ref 0.00–0.07)
Basophils Absolute: 0 10*3/uL (ref 0.0–0.1)
Basophils Relative: 0 %
Eosinophils Absolute: 0.2 10*3/uL (ref 0.0–0.5)
Eosinophils Relative: 2 %
HCT: 40.6 % (ref 36.0–46.0)
Hemoglobin: 12.7 g/dL (ref 12.0–15.0)
Immature Granulocytes: 0 %
Lymphocytes Relative: 39 %
Lymphs Abs: 4.5 10*3/uL — ABNORMAL HIGH (ref 0.7–4.0)
MCH: 29.3 pg (ref 26.0–34.0)
MCHC: 31.3 g/dL (ref 30.0–36.0)
MCV: 93.5 fL (ref 80.0–100.0)
Monocytes Absolute: 0.9 10*3/uL (ref 0.1–1.0)
Monocytes Relative: 8 %
Neutro Abs: 5.9 10*3/uL (ref 1.7–7.7)
Neutrophils Relative %: 51 %
Platelets: 309 10*3/uL (ref 150–400)
RBC: 4.34 MIL/uL (ref 3.87–5.11)
RDW: 13.3 % (ref 11.5–15.5)
WBC: 11.5 10*3/uL — ABNORMAL HIGH (ref 4.0–10.5)
nRBC: 0 % (ref 0.0–0.2)

## 2020-02-27 LAB — BASIC METABOLIC PANEL
Anion gap: 12 (ref 5–15)
BUN: 15 mg/dL (ref 6–20)
CO2: 22 mmol/L (ref 22–32)
Calcium: 9.4 mg/dL (ref 8.9–10.3)
Chloride: 107 mmol/L (ref 98–111)
Creatinine, Ser: 0.77 mg/dL (ref 0.44–1.00)
GFR calc Af Amer: >60 mL/min (ref >60)
GFR calc non Af Amer: >60 mL/min (ref >60)
Glucose, Bld: 103 mg/dL — ABNORMAL HIGH (ref 70–99)
Potassium: 4.1 mmol/L (ref 3.5–5.1)
Sodium: 141 mmol/L (ref 135–145)

## 2020-02-27 MED ORDER — METHOCARBAMOL 500 MG PO TABS: 500.0000 mg | ORAL_TABLET | Freq: Two times a day (BID) | ORAL | 0 refills | Status: AC

## 2020-02-27 NOTE — ED Notes (Signed)
Patient transported to X-ray 
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
## 2020-02-27 NOTE — ED Notes (Signed)
Provided patient with graham crackers and ginger ale as PO challenge.
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
## 2020-02-27 NOTE — Discharge Instructions (Addendum)
At this time there does not appear to be the presence of an emergent medical condition, however there is always the potential for conditions to change. Please read and follow the below instructions.  Please return to the Emergency Department immediately for any new or worsening symptoms. Please be sure to follow up with your Primary Care Provider within one week regarding your visit today; please call their office to schedule an appointment even if you are feeling better for a follow-up visit. You may use the muscle relaxer Robaxin as prescribed to help with your symptoms.  Do not drive or operate heavy machinery while taking Robaxin as it will make you drowsy.  Do not drink alcohol or take other sedating medications while taking Robaxin as this will worsen side effects. Your CT scan today showed some incidental findings.  These incidental findings include fatty liver disease, thickening of the left adrenal gland.  You may review your CT scans and fall on your MyChart account and discuss the results with your primary care provider at your follow-up visit.  Get help right away if: You have: Loss of feeling (numbness), tingling, or weakness in your arms or legs. Very bad neck pain, especially tenderness in the middle of the back of your neck. A change in your ability to control your pee or poop (stool). More pain in any area of your body. Swelling in any area of your body, especially your legs. Shortness of breath or light-headedness. Chest pain. Blood in your pee, poop, or vomit. Very bad pain in your belly (abdomen) or your back. Very bad headaches or headaches that are getting worse. Sudden vision loss or double vision. Your eye suddenly turns red. The black center of your eye (pupil) is an odd shape or size. You have any new/concerning or worsening of symptoms   Please read the additional information packets attached to your discharge summary.  Do not take your medicine if  develop an  itchy rash, swelling in your mouth or lips, or difficulty breathing; call 911 and seek immediate emergency medical attention if this occurs.  You may review your lab tests and imaging results in their entirety on your MyChart account.  Please discuss all results of fully with your primary care provider and other specialist at your follow-up visit.  Note: Portions of this text may have been transcribed using voice recognition software. Every effort was made to ensure accuracy; however, inadvertent computerized transcription errors may still be present.

## 2020-02-27 NOTE — ED Notes (Signed)
Returned from CT.

## 2020-02-27 NOTE — ED Notes (Signed)
Patient talking on cell phone, in no distress at this time. Updated that we are waiting on CT results.

## 2020-02-27 NOTE — ED Notes (Signed)
Ambulatory to bathroom with limping gait.  Reports standing up feels better.  No assistance needed with ambulating.

## 2020-03-16 ENCOUNTER — Encounter: Payer: Self-pay | Admitting: *Deleted

## 2020-04-11 ENCOUNTER — Encounter: Payer: Medicare Other | Admitting: Internal Medicine

## 2020-05-09 ENCOUNTER — Encounter: Payer: Self-pay | Admitting: Internal Medicine

## 2020-05-09 ENCOUNTER — Ambulatory Visit: Payer: Medicare Other | Admitting: Internal Medicine

## 2020-05-09 VITALS — BP 122/77 | HR 84 | Temp 98.1°F | Ht 65.0 in | Wt 201.5 lb

## 2020-05-09 DIAGNOSIS — F32A Depression, unspecified: Secondary | ICD-10-CM

## 2020-05-09 NOTE — Progress Notes (Signed)
Patient left without being seen.

## 2020-05-25 ENCOUNTER — Ambulatory Visit: Payer: Medicare Other | Admitting: Neurology

## 2020-05-25 ENCOUNTER — Encounter: Payer: Self-pay | Admitting: Neurology

## 2020-05-25 ENCOUNTER — Telehealth: Payer: Self-pay | Admitting: *Deleted

## 2020-05-25 NOTE — Telephone Encounter (Signed)
No showed new patient appointment. 

## 2020-07-06 ENCOUNTER — Encounter: Payer: Self-pay | Admitting: *Deleted

## 2020-07-06 NOTE — Progress Notes (Unsigned)

## 2020-07-21 NOTE — Progress Notes (Unsigned)
Things That May Be Affecting Your Health:  Alcohol  Hearing loss  Pain   x Depression  Home Safety  Sexual Health   Diabetes x Lack of physical activity  Stress   Difficulty with daily activities  Loneliness  Tiredness   Drug use  Medicines  Tobacco use   Falls  Motor Vehicle Safety  Weight  x Food choices  Oral Health  Other    YOUR PERSONALIZED HEALTH PLAN : 1. Schedule your next subsequent Medicare Wellness visit in one year 2. Attend all of your regular appointments to address your medical issues 3. Complete the preventative screenings and services   Annual Wellness Visit   Medicare Covered Preventative Screenings and [place] Men and Women Who How Often Need? Date of Last Service Action  Abdominal Aortic Aneurysm Adults with AAA risk factors Once      Alcohol Misuse and Counseling All Adults Screening once a year if no alcohol misuse. Counseling up to 4 face to face sessions.     Bone Density Measurement  Adults at risk for osteoporosis Once every 2 yrs      Lipid Panel Z13.6 All adults without CV disease Once every 5 yrs       Colorectal Cancer   Stool sample or  Colonoscopy All adults 48 and older   Once every year  Every 10 years        Depression All Adults Once a year  Today   Diabetes Screening Blood glucose, post glucose load, or GTT Z13.1  All adults at risk  Pre-diabetics  Once per year  Twice per year x     Diabetes  Self-Management Training All adults Diabetics 10 hrs first year; 2 hours subsequent years. Requires Copay     Glaucoma  Diabetics  Family history of glaucoma  African Americans 96 yrs +  Hispanic Americans 92 yrs + Annually - requires coppay      Hepatitis C Z72.89 or F19.20  High Risk for HCV  Born between 1945 and 1965  Annually  Once      HIV Z11.4 All adults based on risk  Annually btw ages 67 & 75 regardless of risk  Annually > 65 yrs if at increased risk      Lung Cancer Screening  Asymptomatic adults aged 34-77 with 30 pack yr history and current smoker OR quit within the last 15 yrs Annually Must have counseling and shared decision making documentation before first screen      Medical Nutrition Therapy Adults with   Diabetes  Renal disease  Kidney transplant within past 3 yrs 3 hours first year; 2 hours subsequent years     Obesity and Counseling All adults Screening once a year Counseling if BMI 30 or higher  Today   Tobacco Use Counseling Adults who use tobacco  Up to 8 visits in one year     Vaccines Z23  Hepatitis B  Influenza   Pneumonia  Adults   Once  Once every flu season  Two different vaccines separated by one year x covid booster Flu Tdap   Next Annual Wellness Visit People with Medicare Every year  Today     Services & Screenings Women Who How Often Need  Date of Last Service Action  Mammogram  Z12.31 Women over 18 One baseline ages 13-39. Annually ager 40 yrs+      Pap tests All women Annually if high risk. Every 2 yrs for normal risk women x  Screening for cervical cancer with   Pap (Z01.419 nl or Z01.411abnl) &  HPV Z11.51 Women aged 61 to 110 Once every 5 yrs     Screening pelvic and breast exams All women Annually if high risk. Every 2 yrs for normal risk women     Sexually Transmitted Diseases  Chlamydia  Gonorrhea  Syphilis All at risk adults Annually for non pregnant females at increased risk         [place] Men Who How Ofter Need  Date of Last Service Action  Prostate Cancer - DRE & PSA Men over 50 Annually.  DRE might require a copay.        Sexually Transmitted Diseases  Syphilis All at risk adults Annually for men at increased risk      Health Maintenance List Health Maintenance  Topic Date Due  . PAP SMEAR-Modifier  08/23/2019  . COVID-19 Vaccine (3 - Pfizer risk 4-dose series) 10/26/2019  . INFLUENZA VACCINE  01/02/2020  . TETANUS/TDAP  04/30/2020  . MAMMOGRAM  06/23/2021  .  COLONOSCOPY (Pts 45-53yrs Insurance coverage will need to be confirmed)  04/16/2022  . Hepatitis C Screening  Completed  . HIV Screening  Completed

## 2020-07-26 ENCOUNTER — Encounter: Payer: Self-pay | Admitting: Internal Medicine

## 2020-07-26 ENCOUNTER — Other Ambulatory Visit: Payer: Self-pay | Admitting: Internal Medicine

## 2020-07-26 ENCOUNTER — Other Ambulatory Visit: Payer: Self-pay

## 2020-07-26 ENCOUNTER — Ambulatory Visit (INDEPENDENT_AMBULATORY_CARE_PROVIDER_SITE_OTHER): Payer: Medicare Other | Admitting: Internal Medicine

## 2020-07-26 DIAGNOSIS — G8929 Other chronic pain: Secondary | ICD-10-CM

## 2020-07-26 DIAGNOSIS — M545 Low back pain, unspecified: Secondary | ICD-10-CM

## 2020-07-26 MED ORDER — MELOXICAM 7.5 MG PO TABS: 7.5000 mg | ORAL_TABLET | Freq: Every day | ORAL | 0 refills | Status: AC

## 2020-07-26 MED ORDER — CYCLOBENZAPRINE HCL 7.5 MG PO TABS: 7.5000 mg | ORAL_TABLET | Freq: Every day | ORAL | 0 refills | Status: AC

## 2020-07-26 NOTE — Progress Notes (Signed)
   CC: Low back pain  HPI:  Theresa Kirk is a 60 y.o. with history of lumbar osteoarthritis, DJD of the cervical spine here with back pain.  Please see problem based charting for further details.  Past Medical History:  Diagnosis Date  . Alcohol abuse   . Arthritis   . Carpal tunnel syndrome   . Depression   . Fibromyalgia   . High cholesterol    Review of Systems:  As per HPI  Physical Exam:  Vitals:   07/26/20 1022 07/26/20 1024  BP:  121/80  Pulse:  79  Temp:  98.2 F (36.8 C)  TempSrc:  Oral  SpO2:  100%  Weight: 201 lb 8 oz (91.4 kg)   Height: 5\' 5"  (1.651 m)    Physical Exam Vitals and nursing note reviewed.  Constitutional:      Appearance: She is obese.  Cardiovascular:     Rate and Rhythm: Normal rate.     Heart sounds: Normal heart sounds.  Pulmonary:     Breath sounds: Normal breath sounds.  Musculoskeletal:     Cervical back: Neck supple.     Thoracic back: Normal. No edema or tenderness.     Lumbar back: No swelling, deformity, lacerations, spasms or tenderness. Negative right straight leg raise test and negative left straight leg raise test. No scoliosis.     Comments: -Lumbar lordosis -Negative straight leg test bilaterally  Neurological:     Mental Status: She is alert.     Comments: Neurologic exam: Motor: Strength 5/5 on all upper and lower extremities, bulk muscle and tone are normal Deep Tendon Reflexes: 2+ symmetric   Gait:Limited by pain, uses a cane     Assessment & Plan:   See Encounters Tab for problem based charting.  Patient discussed with Dr. Jimmye Norman

## 2020-07-26 NOTE — Patient Instructions (Signed)
[name],   It was a pleasure taking care of you here in the clinic.  Sorry to hear about the pain you are having.  Here my recommendations today:  1.  I am going to prescribe you a muscle relaxant, you take 1 pill every night.  I will also prescribe you Mobic 7.5 mg daily for 7 days 2.  Please try heating pads, lidocaine patches for the pain. 3.  If symptoms does not improve in the next 2 to 4 weeks, please give [place] a call  Take Care! Dr. Eileen Stanford  Please call the internal medicine center clinic if you have any questions or concerns, we may be able to help and keep you from a long and expensive emergency room wait. Our clinic and after hours phone number is [phone], the best time to call is Monday through Friday 9 am to 4 pm but there is always someone available 24/7 if you have an emergency. If you need medication refills please notify your pharmacy one week in advance and they will send [place] a request.   If you have not gotten the COVID vaccine, I recommend doing so:  You may get it at your local [facility] or [facility] OR To schedule an appointment for a COVID vaccine or be added to the vaccine wait list: Go to [email or web address]   OR Go to [email or web address]                  OR Call [phone]                                     OR Call [phone] and select Option 2

## 2020-07-26 NOTE — Assessment & Plan Note (Signed)
#  Acute on chronic low back pain: [name] has a prior history of probable lumbar osteoarthritis and has baseline pain however she states that about 8 days ago she experienced an intense muscle spasm of the left lower back.  2 days afterwards, she began experiencing worsening bilateral low back pain with radiation to the thigh which worsened each time she stood up.  This has been functionally limiting however she continues to do her daily activities with pain.  The pain does not improve with hyperflexion of the spine but it does improve with supine and resting.  Currently, she rates the pain at 10/10.  She denies any urinary or bowel incontinence, fevers, chills, point tenderness or history of malignancy.  She does endorse spasm, cramps and tension.  Assessment: Low back pain that is multifactorial secondary to muscle spasm, osteoarthritis and sciatica  Plan: -Flexeril 7.5 mg nightly x7 days -Mobic 7.5 mg daily x7 days -Advised to try heating pad, lidocaine patches and continue exercises as needed -Monitor symptoms for 2 to 4 weeks, if no improvement, will obtain lower back imaging

## 2020-07-27 ENCOUNTER — Other Ambulatory Visit: Payer: Self-pay | Admitting: Student

## 2020-07-30 NOTE — Progress Notes (Signed)
Internal Medicine Clinic Attending  Case discussed with Dr. Agyei at the time of the visit.  We reviewed the resident's history and exam and pertinent patient test results.  I agree with the assessment, diagnosis, and plan of care documented in the resident's note.    

## 2020-08-10 ENCOUNTER — Telehealth: Payer: Self-pay

## 2020-08-10 NOTE — Telephone Encounter (Signed)
Thank you! I will see her with the resident on Monday.

## 2020-08-10 NOTE — Telephone Encounter (Signed)
Pt is requesting a call back. 

## 2020-08-10 NOTE — Telephone Encounter (Signed)
RTC, patient states she is upset she is unable to get an appt with PCP any sooner than 10/03/20.  Pt states that she is having bilateral arm numbness/tingling over a month now, but it has worsened over past month.  States "it feels like my arms are going to sleep and it is painful and I am frustrated".    Explained to patient that PCP only sees patients 1X/week and she was given the next available appt.  Pt offered an appt w/ resident while PCP is scheduled as attending.  She was made aware she will not see attending that day, only resident, but case will be presented to attending.  She is in agreement.  Appt made for Monday, 08/14/20 @ 0845/[facility]/ Dr. Darrick Meigs.  10/03/20 appt w/ PCP still scheduled in case Dr. Darrick Meigs recommends f/u.  Pt is very appreciative. SChaplin, RN,BSN

## 2020-08-14 ENCOUNTER — Other Ambulatory Visit: Payer: Self-pay

## 2020-08-14 ENCOUNTER — Ambulatory Visit (INDEPENDENT_AMBULATORY_CARE_PROVIDER_SITE_OTHER): Payer: Medicare Other | Admitting: Internal Medicine

## 2020-08-14 ENCOUNTER — Encounter: Payer: Self-pay | Admitting: Internal Medicine

## 2020-08-14 VITALS — BP 137/47 | HR 82 | Temp 98.0°F | Ht 65.0 in | Wt 202.5 lb

## 2020-08-14 DIAGNOSIS — G5601 Carpal tunnel syndrome, right upper limb: Secondary | ICD-10-CM

## 2020-08-14 NOTE — Assessment & Plan Note (Signed)
This encounter was for evaluation of progressive right hand numbness and pain. History and exam are consistent with carpal tunnel syndrome. Plan -wrist brace -referral placed to hand surgery due to associated weakness

## 2020-08-14 NOTE — Patient Instructions (Signed)
It was a pleasure meeting you today. Your symptoms are consistent with carpal tunnel. I would like you to wear a wrist brace to decrease the movements that aggravate it. I have also placed a referral to hand surgery to evaluate this since you are experiencing weakness. You should receive a call to schedule an appointment with them. If you do nto hear from them by next week, please feel free to reach out to our clinic to see where we are in the referral process  Carpal Tunnel Syndrome  Carpal tunnel syndrome is a condition that causes pain, numbness, and weakness in your hand and fingers. The carpal tunnel is a narrow area located on the palm side of your wrist. Repeated wrist motion or certain diseases may cause swelling within the tunnel. This swelling pinches the main nerve in the wrist. The main nerve in the wrist is called the median nerve. What are the causes? This condition may be caused by:  Repeated and forceful wrist and hand motions.  Wrist injuries.  Arthritis.  A cyst or tumor in the carpal tunnel.  Fluid buildup during pregnancy.  Use of tools that vibrate. Sometimes the cause of this condition is not known. What increases the risk? The following factors may make you more likely to develop this condition:  Having a job that requires you to repeatedly or forcefully move your wrist or hand or requires you to use tools that vibrate. This may include jobs that involve using computers, working on an [employer], or working with [place] such as Pension scheme manager.  Being a woman.  Having certain conditions, such as: ? Diabetes. ? Obesity. ? An underactive thyroid (hypothyroidism). ? Kidney failure. ? Rheumatoid arthritis. What are the signs or symptoms? Symptoms of this condition include:  A tingling feeling in your fingers, especially in your thumb, index, and middle fingers.  Tingling or numbness in your hand.  An aching feeling in your entire arm, especially  when your wrist and elbow are bent for a long time.  Wrist pain that goes up your arm to your shoulder.  Pain that goes down into your palm or fingers.  A weak feeling in your hands. You may have trouble grabbing and holding items. Your symptoms may feel worse during the night. How is this diagnosed? This condition is diagnosed with a medical history and physical exam. You may also have tests, including:  Electromyogram (EMG). This test measures electrical signals sent by your nerves into the muscles.  Nerve conduction study. This test measures how well electrical signals pass through your nerves.  Imaging tests, such as X-rays, ultrasound, and MRI. These tests check for possible causes of your condition. How is this treated? This condition may be treated with:  Lifestyle changes. It is important to stop or change the activity that caused your condition.  Doing exercise and activities to strengthen and stretch your muscles and tendons (physical therapy).  Making lifestyle changes to help with your condition and learning how to do your daily activities safely (occupational therapy).  Medicines for pain and inflammation. This may include medicine that is injected into your wrist.  A wrist splint or brace.  Surgery. Follow these instructions at home: If you have a splint or brace:  Wear the splint or brace as told by your health care provider. Remove it only as told by your health care provider.  Loosen the splint or brace if your fingers tingle, become numb, or turn cold and blue.  Keep the splint or brace clean.  If the splint or brace is not waterproof: ? Do not let it get wet. ? Cover it with a watertight covering when you take a bath or shower. Managing pain, stiffness, and swelling If directed, put ice on the painful area. To do this:  If you have a removeable splint or brace, remove it as told by your health care provider.  Put ice in a plastic bag.  Place a towel  between your skin and the bag or between the splint or brace and the bag.  Leave the ice on for 20 minutes, 2-3 times a day. Do not fall asleep with the cold pack on your skin.  Remove the ice if your skin turns bright red. This is very important. If you cannot feel pain, heat, or cold, you have a greater risk of damage to the area. Move your fingers often to reduce stiffness and swelling.   General instructions  Take over-the-counter and prescription medicines only as told by your health care provider.  Rest your wrist and hand from any activity that may be causing your pain. If your condition is work related, talk with your employer about changes that can be made, such as getting a wrist pad to use while typing.  Do any exercises as told by your health care provider, physical therapist, or occupational therapist.  Keep all follow-up visits. This is important. Contact a health care provider if:  You have new symptoms.  Your pain is not controlled with medicines.  Your symptoms get worse. Get help right away if:  You have severe numbness or tingling in your wrist or hand. Summary  Carpal tunnel syndrome is a condition that causes pain, numbness, and weakness in your hand and fingers.  It is usually caused by repeated wrist motions.  Lifestyle changes and medicines are used to treat carpal tunnel syndrome. Surgery may be recommended.  Follow your health care provider's instructions about wearing a splint, resting from activity, keeping follow-up visits, and calling for help. This information is not intended to replace advice given to you by your health care provider. Make sure you discuss any questions you have with your health care provider. Document Revised: 09/30/2019 Document Reviewed: 09/30/2019 Elsevier Patient Education  [place].

## 2020-08-14 NOTE — Progress Notes (Signed)
Office Visit   Patient ID: Theresa Kirk, female    DOB: July 12, 1960, 60 y.o.   MRN: [id number]  Subjective:  CC: right hand/forearm numbness and weakness  HPI 60 y.o. presents today for evaluation of the above. She notes that symptoms began with some numbness and tingling in her right fingers (1-3) which slowly progressed to involving her hand. More recently, she has been experiencing pain in the hand as well which has began to involve her anterior forearm. She has also noted grip weakness including difficulty opening a bottle or holding a cup. 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  Symptoms resolve with shaking out her hands for a few minutes. She notes having less severe symptoms in the past and being told to wear a wrist brace.       ACTIVE MEDICATIONS   Outpatient Medications Prior to Visit  Medication Sig Dispense Refill  . albuterol (VENTOLIN HFA) 108 (90 Base) MCG/ACT inhaler Inhale 1-2 puffs into the lungs every 6 (six) hours as needed for wheezing or shortness of breath. 18 g 0  . ARIPiprazole (ABILIFY) 10 MG tablet Take 1 tablet (10 mg total) by mouth daily. 30 tablet 0  . buPROPion (WELLBUTRIN) 75 MG tablet Take 1 tablet (75 mg total) by mouth daily. 30 tablet 0  . Cetirizine HCl 10 MG CAPS Take 1 capsule (10 mg total) by mouth daily. 30 capsule 0  . citalopram (CELEXA) 20 MG tablet TAKE 1 TABLET(20 MG) BY MOUTH DAILY 30 tablet 2  . cloNIDine (CATAPRES) 0.2 MG tablet Take 1 tablet (0.2 mg total) by mouth 2 (two) times daily. 60 tablet 0  . diclofenac sodium (VOLTAREN) 1 % GEL Apply 4 g topically 4 (four) times daily as needed. 100 g 1  . fluticasone (FLONASE) 50 MCG/ACT nasal spray Place 1 spray into both nostrils daily. 16 g 2  . MYRBETRIQ 50 MG TB24 tablet Take 50 mg by mouth daily.    . pantoprazole (PROTONIX) 40 MG tablet TAKE 1 TABLET BY MOUTH EVERY DAY(TAKE 30 MINS BEFORE MEALS) 90 tablet 1  .  promethazine-dextromethorphan (PROMETHAZINE-DM) 6.25-15 MG/5ML syrup Take 5 mLs by mouth at bedtime as needed for cough. 100 mL 0  . rOPINIRole (REQUIP) 0.5 MG tablet Take 0.5 mg by mouth at bedtime.     No facility-administered medications prior to visit.     ROS  Review of Systems  Musculoskeletal: Negative for arthralgias, joint swelling, neck pain and neck stiffness.  Skin: Negative for color change.  Neurological: Positive for weakness and numbness.    Objective:   BP (!) 137/47 (BP Location: Left Arm, Patient Position: Sitting, Cuff Size: Normal)   Pulse 82   Temp 98 F (36.7 C) (Oral)   Ht 5\' 5"  (1.651 m)   Wt 202 lb 8 oz (91.9 kg)   LMP 09/14/2006   SpO2 99%   BMI 33.70 kg/m  Wt Readings from Last 3 Encounters:  08/14/20 202 lb 8 oz (91.9 kg)  07/26/20 201 lb 8 oz (91.4 kg)  05/09/20 201 lb 8 oz (91.4 kg)   BP Readings from Last 3 Encounters:  08/14/20 (!) 137/47  07/26/20 121/80  05/09/20 122/77   Physical exam General: well appearing in NAD CV: peripheral pulses intact in the upper extremities. Right wrist exam: no obvious deformity. No erythema or edema. No pain on palpation or ROM.  Neuro: +phalens and tinnels test on the right. Right hand sensation intact at time of  the exam.  Health Maintenance:   Health Maintenance  Topic Date Due  . PAP SMEAR-Modifier  08/23/2019  . COVID-19 Vaccine (3 - Pfizer risk 4-dose series) 10/26/2019  . INFLUENZA VACCINE  01/02/2020  . TETANUS/TDAP  04/30/2020  . MAMMOGRAM  06/23/2021  . COLONOSCOPY (Pts 45-53yrs Insurance coverage will need to be confirmed)  04/16/2022  . Hepatitis C Screening  Completed  . HIV Screening  Completed  . HPV VACCINES  Aged Out     Assessment & Plan:   Problem List Items Addressed This Visit      Nervous and Auditory   Carpal tunnel syndrome - Primary    This encounter was for evaluation of progressive right hand numbness and pain. History and exam are consistent with carpal tunnel  syndrome. Plan -wrist brace -referral placed to hand surgery due to associated weakness      Relevant Orders   Ambulatory referral to Hand Surgery      Pt discussed with Dr. Adolm Joseph, MD Internal Medicine Resident PGY-2 [facility] Pager: [phone] 59/14/2022 9:54 AM

## 2020-08-16 NOTE — Progress Notes (Signed)
Internal Medicine Clinic Attending  Case discussed with Dr. Christian  At the time of the visit.  We reviewed the resident's history and exam and pertinent patient test results.  I agree with the assessment, diagnosis, and plan of care documented in the resident's note.  

## 2020-08-21 DIAGNOSIS — R202 Paresthesia of skin: Secondary | ICD-10-CM | POA: Diagnosis not present

## 2020-08-21 DIAGNOSIS — G5603 Carpal tunnel syndrome, bilateral upper limbs: Secondary | ICD-10-CM | POA: Diagnosis not present

## 2020-10-02 ENCOUNTER — Encounter: Payer: Self-pay | Admitting: Internal Medicine

## 2020-10-03 ENCOUNTER — Encounter: Payer: Medicare Other | Admitting: Internal Medicine

## 2020-10-03 ENCOUNTER — Other Ambulatory Visit: Payer: Self-pay

## 2020-10-18 DIAGNOSIS — G5603 Carpal tunnel syndrome, bilateral upper limbs: Secondary | ICD-10-CM | POA: Diagnosis not present

## 2020-11-01 DIAGNOSIS — G5622 Lesion of ulnar nerve, left upper limb: Secondary | ICD-10-CM | POA: Diagnosis not present

## 2020-11-01 DIAGNOSIS — G5603 Carpal tunnel syndrome, bilateral upper limbs: Secondary | ICD-10-CM | POA: Diagnosis not present

## 2020-11-02 ENCOUNTER — Other Ambulatory Visit: Payer: Self-pay | Admitting: Orthopedic Surgery

## 2020-12-25 ENCOUNTER — Encounter (HOSPITAL_BASED_OUTPATIENT_CLINIC_OR_DEPARTMENT_OTHER): Payer: Self-pay | Admitting: Orthopedic Surgery

## 2020-12-25 ENCOUNTER — Other Ambulatory Visit: Payer: Self-pay

## 2020-12-29 NOTE — Progress Notes (Signed)

## 2020-12-31 ENCOUNTER — Encounter (HOSPITAL_COMMUNITY): Payer: Self-pay | Admitting: Anesthesiology

## 2020-12-31 NOTE — Anesthesia Preprocedure Evaluation (Deleted)
Anesthesia Evaluation    Reviewed: Allergy & Precautions, Patient's Chart, lab work & pertinent test results  Airway        Dental   Pulmonary asthma , former smoker,           Cardiovascular negative cardio ROS       Neuro/Psych PSYCHIATRIC DISORDERS Depression negative neurological ROS     GI/Hepatic GERD  Medicated and Controlled,(+)     substance abuse  alcohol use,   Endo/Other  Obesity BMI 34  Renal/GU negative Renal ROS  negative genitourinary   Musculoskeletal  (+) Arthritis , Osteoarthritis,  Fibromyalgia -  Abdominal (+) + obese,   Peds  Hematology negative hematology ROS (+)   Anesthesia Other Findings Right carpal tunnel syn  Reproductive/Obstetrics negative OB ROS                             Anesthesia Physical Anesthesia Plan  ASA: 2  Anesthesia Plan: MAC and Bier Block and Bier Block-LIDOCAINE ONLY   Post-op Pain Management:    Induction:   PONV Risk Score and Plan: 2 and Propofol infusion and TIVA  Airway Management Planned: Natural Airway and Simple Face Mask  Additional Equipment: None  Intra-op Plan:   Post-operative Plan:   Informed Consent:   Plan Discussed with:   Anesthesia Plan Comments:         Anesthesia Quick Evaluation

## 2021-01-01 ENCOUNTER — Other Ambulatory Visit: Payer: Self-pay

## 2021-01-01 ENCOUNTER — Encounter (HOSPITAL_BASED_OUTPATIENT_CLINIC_OR_DEPARTMENT_OTHER): Payer: Self-pay | Admitting: Orthopedic Surgery

## 2021-01-01 ENCOUNTER — Ambulatory Visit (HOSPITAL_BASED_OUTPATIENT_CLINIC_OR_DEPARTMENT_OTHER)
Admission: RE | Admit: 2021-01-01 | Discharge: 2021-01-01 | Disposition: A | Discharge status: Home / Self Care | Payer: Medicare Other | Attending: Orthopedic Surgery | Admitting: Orthopedic Surgery

## 2021-01-01 ENCOUNTER — Encounter (HOSPITAL_BASED_OUTPATIENT_CLINIC_OR_DEPARTMENT_OTHER)
Admission: RE | Disposition: A | Discharge status: Home / Self Care | Payer: Self-pay | Source: Home / Self Care | Attending: Orthopedic Surgery

## 2021-01-01 DIAGNOSIS — G5601 Carpal tunnel syndrome, right upper limb: Secondary | ICD-10-CM | POA: Insufficient documentation

## 2021-01-01 DIAGNOSIS — Z5309 Procedure and treatment not carried out because of other contraindication: Secondary | ICD-10-CM | POA: Insufficient documentation

## 2021-01-01 HISTORY — DX: Unspecified asthma, uncomplicated: J45.909

## 2021-01-01 SURGERY — CARPAL TUNNEL RELEASE
Anesthesia: Monitor Anesthesia Care | Laterality: Right

## 2021-01-01 MED ORDER — LACTATED RINGERS IV SOLN: INTRAVENOUS | Status: DC

## 2021-01-01 MED ORDER — CEFAZOLIN SODIUM-DEXTROSE 2-4 GM/100ML-% IV SOLN
INTRAVENOUS | Status: AC
  Filled 2021-01-01: qty 100

## 2021-01-01 MED ORDER — ACETAMINOPHEN 500 MG PO TABS
ORAL_TABLET | ORAL | Status: AC
  Filled 2021-01-01: qty 2

## 2021-01-01 MED ORDER — ACETAMINOPHEN 500 MG PO TABS: 1000.0000 mg | ORAL_TABLET | Freq: Once | ORAL | Status: DC

## 2021-01-01 MED ORDER — CEFAZOLIN SODIUM-DEXTROSE 2-4 GM/100ML-% IV SOLN: 2.0000 g | INTRAVENOUS | Status: DC

## 2021-01-01 NOTE — Progress Notes (Signed)
Patient drank two high protein chocolate ensures around 10-10:30 am this morning. Canceled procedure per anesthesia. Physician notified. Patient understood and will call Dr. Levell July office to reschedule.

## 2021-01-01 NOTE — H&P (Signed)
Procedure cancelled.

## 2021-01-02 ENCOUNTER — Other Ambulatory Visit: Payer: Self-pay | Admitting: Orthopedic Surgery

## 2021-01-05 ENCOUNTER — Encounter (HOSPITAL_BASED_OUTPATIENT_CLINIC_OR_DEPARTMENT_OTHER): Payer: Self-pay | Admitting: Orthopedic Surgery

## 2021-01-05 ENCOUNTER — Other Ambulatory Visit: Payer: Self-pay

## 2021-01-05 ENCOUNTER — Encounter (HOSPITAL_BASED_OUTPATIENT_CLINIC_OR_DEPARTMENT_OTHER)
Admission: RE | Disposition: A | Discharge status: Home / Self Care | Payer: Self-pay | Source: Home / Self Care | Attending: Orthopedic Surgery

## 2021-01-05 ENCOUNTER — Ambulatory Visit (HOSPITAL_BASED_OUTPATIENT_CLINIC_OR_DEPARTMENT_OTHER): Payer: Medicare Other | Admitting: Anesthesiology

## 2021-01-05 ENCOUNTER — Ambulatory Visit (HOSPITAL_BASED_OUTPATIENT_CLINIC_OR_DEPARTMENT_OTHER)
Admission: RE | Admit: 2021-01-05 | Discharge: 2021-01-05 | Disposition: A | Discharge status: Home / Self Care | Payer: Medicare Other | Attending: Orthopedic Surgery | Admitting: Orthopedic Surgery

## 2021-01-05 DIAGNOSIS — Z823 Family history of stroke: Secondary | ICD-10-CM | POA: Diagnosis not present

## 2021-01-05 DIAGNOSIS — Z833 Family history of diabetes mellitus: Secondary | ICD-10-CM | POA: Insufficient documentation

## 2021-01-05 DIAGNOSIS — Z8249 Family history of ischemic heart disease and other diseases of the circulatory system: Secondary | ICD-10-CM | POA: Insufficient documentation

## 2021-01-05 DIAGNOSIS — Z888 Allergy status to other drugs, medicaments and biological substances status: Secondary | ICD-10-CM | POA: Diagnosis not present

## 2021-01-05 DIAGNOSIS — Z886 Allergy status to analgesic agent status: Secondary | ICD-10-CM | POA: Diagnosis not present

## 2021-01-05 DIAGNOSIS — Z8349 Family history of other endocrine, nutritional and metabolic diseases: Secondary | ICD-10-CM | POA: Diagnosis not present

## 2021-01-05 DIAGNOSIS — Z803 Family history of malignant neoplasm of breast: Secondary | ICD-10-CM | POA: Insufficient documentation

## 2021-01-05 DIAGNOSIS — E785 Hyperlipidemia, unspecified: Secondary | ICD-10-CM | POA: Diagnosis not present

## 2021-01-05 DIAGNOSIS — K219 Gastro-esophageal reflux disease without esophagitis: Secondary | ICD-10-CM | POA: Diagnosis not present

## 2021-01-05 DIAGNOSIS — Z87891 Personal history of nicotine dependence: Secondary | ICD-10-CM | POA: Diagnosis not present

## 2021-01-05 DIAGNOSIS — G5601 Carpal tunnel syndrome, right upper limb: Secondary | ICD-10-CM | POA: Diagnosis not present

## 2021-01-05 DIAGNOSIS — Z809 Family history of malignant neoplasm, unspecified: Secondary | ICD-10-CM | POA: Diagnosis not present

## 2021-01-05 DIAGNOSIS — F32A Depression, unspecified: Secondary | ICD-10-CM | POA: Diagnosis not present

## 2021-01-05 HISTORY — PX: CARPAL TUNNEL RELEASE: SHX101

## 2021-01-05 SURGERY — CARPAL TUNNEL RELEASE
Anesthesia: Choice | Laterality: Right

## 2021-01-05 SURGERY — CARPAL TUNNEL RELEASE
Anesthesia: General | Laterality: Right

## 2021-01-05 MED ORDER — MIDAZOLAM HCL 5 MG/5ML IJ SOLN
INTRAMUSCULAR | Status: DC | PRN
  Administered 2021-01-05: 2 mg via INTRAVENOUS

## 2021-01-05 MED ORDER — CEFAZOLIN SODIUM-DEXTROSE 2-4 GM/100ML-% IV SOLN
2.0000 g | INTRAVENOUS | Status: AC
  Administered 2021-01-05: 2 g via INTRAVENOUS

## 2021-01-05 MED ORDER — FENTANYL CITRATE (PF) 100 MCG/2ML IJ SOLN
INTRAMUSCULAR | Status: AC
  Filled 2021-01-05: qty 2

## 2021-01-05 MED ORDER — BUPIVACAINE HCL (PF) 0.25 % IJ SOLN
INTRAMUSCULAR | Status: DC | PRN
  Administered 2021-01-05: 10 mL

## 2021-01-05 MED ORDER — MIDAZOLAM HCL 2 MG/2ML IJ SOLN
INTRAMUSCULAR | Status: AC
  Filled 2021-01-05: qty 2

## 2021-01-05 MED ORDER — PROPOFOL 10 MG/ML IV BOLUS
INTRAVENOUS | Status: DC | PRN
  Administered 2021-01-05: 200 mg via INTRAVENOUS

## 2021-01-05 MED ORDER — PROMETHAZINE HCL 25 MG/ML IJ SOLN: 6.2500 mg | INTRAMUSCULAR | Status: DC | PRN

## 2021-01-05 MED ORDER — ONDANSETRON HCL 4 MG/2ML IJ SOLN
INTRAMUSCULAR | Status: AC
  Filled 2021-01-05: qty 2

## 2021-01-05 MED ORDER — GLYCOPYRROLATE PF 0.2 MG/ML IJ SOSY
PREFILLED_SYRINGE | INTRAMUSCULAR | Status: AC
  Filled 2021-01-05: qty 1

## 2021-01-05 MED ORDER — LACTATED RINGERS IV SOLN: INTRAVENOUS | Status: DC

## 2021-01-05 MED ORDER — MEPERIDINE HCL 25 MG/ML IJ SOLN: 6.2500 mg | INTRAMUSCULAR | Status: DC | PRN

## 2021-01-05 MED ORDER — FENTANYL CITRATE (PF) 100 MCG/2ML IJ SOLN
25.0000 ug | INTRAMUSCULAR | Status: DC | PRN
  Administered 2021-01-05: 25 ug via INTRAVENOUS
  Administered 2021-01-05: 50 ug via INTRAVENOUS

## 2021-01-05 MED ORDER — PROPOFOL 500 MG/50ML IV EMUL
INTRAVENOUS | Status: AC
  Filled 2021-01-05: qty 50

## 2021-01-05 MED ORDER — ONDANSETRON HCL 4 MG/2ML IJ SOLN
INTRAMUSCULAR | Status: DC | PRN
  Administered 2021-01-05: 4 mg via INTRAVENOUS

## 2021-01-05 MED ORDER — DEXAMETHASONE SODIUM PHOSPHATE 10 MG/ML IJ SOLN
INTRAMUSCULAR | Status: DC | PRN
  Administered 2021-01-05: 10 mg via INTRAVENOUS

## 2021-01-05 MED ORDER — HYDROCODONE-ACETAMINOPHEN 5-325 MG PO TABS: ORAL_TABLET | ORAL | 0 refills | Status: DC

## 2021-01-05 MED ORDER — FENTANYL CITRATE (PF) 100 MCG/2ML IJ SOLN
INTRAMUSCULAR | Status: DC | PRN
  Administered 2021-01-05 (×4): 50 ug via INTRAVENOUS

## 2021-01-05 MED ORDER — CEFAZOLIN SODIUM-DEXTROSE 2-4 GM/100ML-% IV SOLN
INTRAVENOUS | Status: AC
  Filled 2021-01-05: qty 100

## 2021-01-05 MED ORDER — DEXAMETHASONE SODIUM PHOSPHATE 10 MG/ML IJ SOLN
INTRAMUSCULAR | Status: AC
  Filled 2021-01-05: qty 1

## 2021-01-05 MED ORDER — 0.9 % SODIUM CHLORIDE (POUR BTL) OPTIME
TOPICAL | Status: DC | PRN
  Administered 2021-01-05: 100 mL

## 2021-01-05 MED ORDER — PROPOFOL 500 MG/50ML IV EMUL
INTRAVENOUS | Status: DC | PRN
  Administered 2021-01-05: 50 ug/kg/min via INTRAVENOUS

## 2021-01-05 MED ORDER — LIDOCAINE HCL (PF) 2 % IJ SOLN
INTRAMUSCULAR | Status: DC | PRN
  Administered 2021-01-05: 80 mg via INTRADERMAL

## 2021-01-05 SURGICAL SUPPLY — 35 items
APL PRP STRL LF DISP 70% ISPRP (MISCELLANEOUS) ×1
BLADE SURG 15 STRL LF DISP TIS (BLADE) ×2 IMPLANT
BLADE SURG 15 STRL SS (BLADE) ×4
BNDG CMPR 9X4 STRL LF SNTH (GAUZE/BANDAGES/DRESSINGS)
BNDG ELASTIC 3X5.8 VLCR STR LF (GAUZE/BANDAGES/DRESSINGS) ×2 IMPLANT
BNDG ESMARK 4X9 LF (GAUZE/BANDAGES/DRESSINGS) IMPLANT
BNDG GAUZE ELAST 4 BULKY (GAUZE/BANDAGES/DRESSINGS) ×2 IMPLANT
CHLORAPREP W/TINT 26 (MISCELLANEOUS) ×2 IMPLANT
CORD BIPOLAR FORCEPS 12FT (ELECTRODE) ×2 IMPLANT
COVER BACK TABLE 60X90IN (DRAPES) ×2 IMPLANT
COVER MAYO STAND STRL (DRAPES) ×2 IMPLANT
CUFF TOURN SGL QUICK 18X4 (TOURNIQUET CUFF) ×2 IMPLANT
DRAPE EXTREMITY T 121X128X90 (DISPOSABLE) ×2 IMPLANT
DRAPE SURG 17X23 STRL (DRAPES) ×2 IMPLANT
DRSG PAD ABDOMINAL 8X10 ST (GAUZE/BANDAGES/DRESSINGS) ×2 IMPLANT
GAUZE SPONGE 4X4 12PLY STRL (GAUZE/BANDAGES/DRESSINGS) ×2 IMPLANT
GAUZE XEROFORM 1X8 LF (GAUZE/BANDAGES/DRESSINGS) ×2 IMPLANT
GLOVE SRG 8 PF TXTR STRL LF DI (GLOVE) ×1 IMPLANT
GLOVE SURG ENC MOIS LTX SZ7.5 (GLOVE) ×3 IMPLANT
GLOVE SURG UNDER POLY LF SZ8 (GLOVE) ×2
GOWN STRL REUS W/ TWL LRG LVL3 (GOWN DISPOSABLE) ×1 IMPLANT
GOWN STRL REUS W/TWL LRG LVL3 (GOWN DISPOSABLE) ×2
GOWN STRL REUS W/TWL XL LVL3 (GOWN DISPOSABLE) ×2 IMPLANT
NDL HYPO 25X1 1.5 SAFETY (NEEDLE) ×1 IMPLANT
NEEDLE HYPO 25X1 1.5 SAFETY (NEEDLE) ×2 IMPLANT
NS IRRIG 1000ML POUR BTL (IV SOLUTION) ×2 IMPLANT
PACK BASIN DAY SURGERY FS (CUSTOM PROCEDURE TRAY) ×2 IMPLANT
PADDING CAST ABS 4INX4YD NS (CAST SUPPLIES) ×1
PADDING CAST ABS COTTON 4X4 ST (CAST SUPPLIES) ×1 IMPLANT
STOCKINETTE 4X48 STRL (DRAPES) ×2 IMPLANT
SUT ETHILON 4 0 PS 2 18 (SUTURE) ×2 IMPLANT
SYR BULB EAR ULCER 3OZ GRN STR (SYRINGE) ×2 IMPLANT
SYR CONTROL 10ML LL (SYRINGE) ×2 IMPLANT
TOWEL GREEN STERILE FF (TOWEL DISPOSABLE) ×4 IMPLANT
UNDERPAD 30X36 HEAVY ABSORB (UNDERPADS AND DIAPERS) ×2 IMPLANT

## 2021-01-05 NOTE — H&P (Signed)
  Theresa Kirk is an 60 y.o. female.   Chief Complaint: carpal tunnel syndrome HPI: 60 yo female with numbness and tingling right hand.  Positive nerve conduction studies.  Nocturnal symptoms.  Allergies:  Allergies  Allergen Reactions   Iohexol Anaphylaxis    24 hr pre meds (did fine) Swelling    Ibuprofen Nausea Only    Past Medical History:  Diagnosis Date   Alcohol abuse    Arthritis    Asthma    Callus of foot 06/07/2008   Qualifier: Diagnosis of  By: Eyvonne Mechanic MD, Vijay     Carpal tunnel syndrome    Depression    Fibromyalgia    High cholesterol     Past Surgical History:  Procedure Laterality Date   ABDOMINAL HYSTERECTOMY  2008   h/o uterine fibroids, ?partial hysterectomy per pt 04/13/12   COLONOSCOPY     FOOT SURGERY Right    KNEE SURGERY      Family History: Family History  Problem Relation Age of Onset   Diabetes Mother    Hypertension Mother    Stroke Mother    Diabetes Sister    Thyroid disease Sister    Hypertension Sister    Cancer Maternal Aunt        unknown   Breast cancer Maternal Aunt     Social History:   reports that she quit smoking about 20 years ago. Her smoking use included cigarettes. She has never used smokeless tobacco. She reports current alcohol use. She reports current drug use. Drug: Marijuana.  Medications: No medications prior to admission.    No results found for this or any previous visit (from the past 48 hour(s)).  No results found.   A comprehensive review of systems was negative.  Height '5\' 5"'$  (1.651 m), weight 92.1 kg, last menstrual period 09/14/2006.  General appearance: alert, cooperative, and appears stated age Head: Normocephalic, without obvious abnormality, atraumatic Neck: supple, symmetrical, trachea midline Cardio: regular rate and rhythm Resp: clear to auscultation bilaterally Extremities: Intact sensation and capillary refill all digits.  +epl/fpl/io.  No wounds.  Pulses: 2+ and  symmetric Skin: Skin color, texture, turgor normal. No rashes or lesions Neurologic: Grossly normal Incision/Wound: none  Assessment/Plan Right carpal tunnel syndrome.  Non operative and operative treatment options have been discussed with the patient and patient wishes to proceed with operative treatment. Risks, benefits, and alternatives of surgery have been discussed and the patient agrees with the plan of care.   Theresa Kirk 01/05/2021, 12:08 PM

## 2021-01-05 NOTE — Anesthesia Preprocedure Evaluation (Addendum)
Anesthesia Evaluation  Patient identified by MRN, date of birth, ID band Patient awake    Reviewed: Allergy & Precautions, NPO status , Patient's Chart, lab work & pertinent test results  Airway Mallampati: II  TM Distance: >3 FB Neck ROM: Full    Dental  (+) Dental Advisory Given, Missing   Pulmonary asthma , former smoker,    Pulmonary exam normal breath sounds clear to auscultation       Cardiovascular negative cardio ROS Normal cardiovascular exam Rhythm:Regular Rate:Normal     Neuro/Psych PSYCHIATRIC DISORDERS Depression negative neurological ROS     GI/Hepatic GERD  ,(+)     substance abuse  alcohol use and marijuana use,   Endo/Other  negative endocrine ROS  Renal/GU negative Renal ROS     Musculoskeletal  (+) Arthritis , Fibromyalgia -  Abdominal (+) + obese,   Peds  Hematology negative hematology ROS (+)   Anesthesia Other Findings   Reproductive/Obstetrics                           Anesthesia Physical Anesthesia Plan  ASA: 3  Anesthesia Plan: General   Post-op Pain Management:    Induction: Intravenous  PONV Risk Score and Plan: 2 and Ondansetron, Propofol infusion, Treatment may vary due to age or medical condition and Midazolam  Airway Management Planned: LMA  Additional Equipment:   Intra-op Plan:   Post-operative Plan: Extubation in OR  Informed Consent: I have reviewed the patients History and Physical, chart, labs and discussed the procedure including the risks, benefits and alternatives for the proposed anesthesia with the patient or authorized representative who has indicated his/her understanding and acceptance.     Dental advisory given  Plan Discussed with: CRNA  Anesthesia Plan Comments:        Anesthesia Quick Evaluation

## 2021-01-05 NOTE — Anesthesia Procedure Notes (Signed)
Procedure Name: LMA Insertion Date/Time: 01/05/2021 2:25 PM Performed by: Ezequiel Kayser, CRNA Pre-anesthesia Checklist: Patient identified, Emergency Drugs available, Suction available and Patient being monitored Patient Re-evaluated:Patient Re-evaluated prior to induction Oxygen Delivery Method: Circle System Utilized Preoxygenation: Pre-oxygenation with 100% oxygen Induction Type: IV induction Ventilation: Mask ventilation without difficulty LMA: LMA inserted LMA Size: 4.0 Number of attempts: 1 Airway Equipment and Method: Bite block Placement Confirmation: positive ETCO2 Tube secured with: Tape Dental Injury: Teeth and Oropharynx as per pre-operative assessment

## 2021-01-05 NOTE — Op Note (Signed)
01/05/2021 [facility]                              OPERATIVE REPORT   PREOPERATIVE DIAGNOSIS:  Right carpal tunnel syndrome.  POSTOPERATIVE DIAGNOSIS:  Right carpal tunnel syndrome.  PROCEDURE:  Right carpal tunnel release.  SURGEON:  [name], MD  ASSISTANT:  none.  ANESTHESIA: General  IV FLUIDS:  Per anesthesia flow sheet.  ESTIMATED BLOOD LOSS:  Minimal.  COMPLICATIONS:  None.  SPECIMENS:  None.  TOURNIQUET TIME:    Total Tourniquet Time Documented: Upper Arm (Right) - 13 minutes Total: Upper Arm (Right) - 13 minutes   DISPOSITION:  Stable to PACU.  LOCATION: [facility]  INDICATIONS:  59 yo female with numbness and tingling right hand.  Nocturnal symptoms.  Positive nerve conduction studies.  She wishes to have a carpal tunnel release for management of her symptoms.  Risks, benefits and alternatives of surgery were discussed including the risk of blood loss; infection; damage to nerves, vessels, tendons, ligaments, bone; failure of surgery; need for additional surgery; complications with wound healing; continued pain; recurrence of carpal tunnel syndrome; and damage to motor branch. She voiced understanding of these risks and elected to proceed.   OPERATIVE COURSE:  After being identified preoperatively by myself, the patient and I agreed upon the procedure and site of procedure.  The surgical site was marked.  Surgical consent had been signed.  She was given preoperative IV antibiotic prophylaxis.  She was transferred to the operating room and placed on the operating room table in supine position with the Right upper extremity on an armboard.  General anesthesia was induced by the anesthesiologist.  Right upper extremity was prepped and draped in normal sterile orthopaedic fashion.  A surgical pause was performed between the surgeons, anesthesia, and operating room staff, and all were in agreement as to the patient, procedure, and site of  procedure.  Tourniquet at the proximal aspect of the extremity was inflated to 250 mmHg after exsanguination of the arm with an Esmarch bandage  Incision was made over the transverse carpal ligament and carried into the subcutaneous tissues by spreading technique.  Bipolar electrocautery was used to obtain hemostasis.  The palmar fascia was sharply incised.  The transverse carpal ligament was identified and sharply incised.  It was incised distally first.  The flexor tendons were identified.  The flexor tendon to the ring finger was identified and retracted radially.  The transverse carpal ligament was then incised proximally.  Scissors were used to split the distal aspect of the volar antebrachial fascia.  A finger was placed into the wound to ensure complete decompression, which was the case.  The nerve was examined.  It was adherent to the radial leaflet.  The motor branch was identified and was intact.  The wound was copiously irrigated with sterile saline.  It was then closed with 4-0 nylon in a horizontal mattress fashion.  It was injected with 0.25% plain Marcaine to aid in postoperative analgesia.  It was dressed with sterile Xeroform, 4x4s, an ABD, and wrapped with Kerlix and an Ace bandage.  Tourniquet was deflated at 13 minutes.  Fingertips were pink with brisk capillary refill after deflation of the tourniquet.  Operative drapes were broken down.  The patient was awoken from anesthesia safely.  She was transferred back to stretcher and taken to the PACU in stable condition.  I will see her back  in the office in 1 week for postoperative followup.  I will give her a prescription for Norco 5/325 1-2 tabs PO q6 hours prn pain, dispense # 15.    [name], MD Electronically signed, 01/05/21

## 2021-01-05 NOTE — Transfer of Care (Signed)
Immediate Anesthesia Transfer of Care Note  Patient: Theresa Kirk  Procedure(s) Performed: RIGHT CARPAL TUNNEL RELEASE (Right)  Patient Location: PACU  Anesthesia Type:General  Level of Consciousness: drowsy  Airway & Oxygen Therapy: Patient Spontanous Breathing and Patient connected to face mask oxygen  Post-op Assessment: Report given to RN and Post -op Vital signs reviewed and stable  Post vital signs: Reviewed and stable  Last Vitals:  Vitals Value Taken Time  BP 116/63 01/05/21 1453  Temp    Pulse 62 01/05/21 1455  Resp 14 01/05/21 1455  SpO2 100 % 01/05/21 1455  Vitals shown include unvalidated device data.  Last Pain:  Vitals:   01/05/21 1229  TempSrc: Oral  PainSc: 7       Patients Stated Pain Goal: 3 ([id number])  Complications: No notable events documented.

## 2021-01-05 NOTE — Discharge Instructions (Addendum)

## 2021-01-08 ENCOUNTER — Encounter (HOSPITAL_BASED_OUTPATIENT_CLINIC_OR_DEPARTMENT_OTHER): Payer: Self-pay | Admitting: Orthopedic Surgery

## 2021-01-09 ENCOUNTER — Encounter (HOSPITAL_BASED_OUTPATIENT_CLINIC_OR_DEPARTMENT_OTHER): Payer: Self-pay | Admitting: Orthopedic Surgery

## 2021-01-09 NOTE — Anesthesia Postprocedure Evaluation (Signed)
Anesthesia Post Note  Patient: Theresa Kirk  Procedure(s) Performed: RIGHT CARPAL TUNNEL RELEASE (Right)     Patient location during evaluation: PACU Anesthesia Type: General Level of consciousness: sedated and patient cooperative Pain management: pain level controlled Vital Signs Assessment: post-procedure vital signs reviewed and stable Respiratory status: spontaneous breathing Cardiovascular status: stable Anesthetic complications: no   No notable events documented.  Last Vitals:  Vitals:   01/05/21 1515 01/05/21 1530  BP: 138/73 (!) 159/80  Pulse: 74 76  Resp: 15 18  Temp:  36.5 C  SpO2: 100% 93%    Last Pain:  Vitals:   01/08/21 0908  TempSrc:   PainSc: 0-No pain                 Nolon Nations

## 2021-05-15 ENCOUNTER — Ambulatory Visit (INDEPENDENT_AMBULATORY_CARE_PROVIDER_SITE_OTHER): Payer: Medicare Other | Admitting: Internal Medicine

## 2021-05-15 ENCOUNTER — Other Ambulatory Visit: Payer: Self-pay | Admitting: Internal Medicine

## 2021-05-15 ENCOUNTER — Encounter: Payer: Self-pay | Admitting: Internal Medicine

## 2021-05-15 VITALS — BP 120/77 | HR 76 | Temp 98.3°F | Ht 65.0 in | Wt 198.3 lb

## 2021-05-15 DIAGNOSIS — R3914 Feeling of incomplete bladder emptying: Secondary | ICD-10-CM

## 2021-05-15 DIAGNOSIS — F32A Depression, unspecified: Secondary | ICD-10-CM

## 2021-05-15 DIAGNOSIS — R590 Localized enlarged lymph nodes: Secondary | ICD-10-CM

## 2021-05-15 DIAGNOSIS — Z0000 Encounter for general adult medical examination without abnormal findings: Secondary | ICD-10-CM

## 2021-05-15 DIAGNOSIS — R232 Flushing: Secondary | ICD-10-CM

## 2021-05-15 DIAGNOSIS — E785 Hyperlipidemia, unspecified: Secondary | ICD-10-CM | POA: Diagnosis not present

## 2021-05-15 DIAGNOSIS — R053 Chronic cough: Secondary | ICD-10-CM | POA: Diagnosis not present

## 2021-05-15 DIAGNOSIS — R1013 Epigastric pain: Secondary | ICD-10-CM

## 2021-05-15 DIAGNOSIS — K219 Gastro-esophageal reflux disease without esophagitis: Secondary | ICD-10-CM

## 2021-05-15 DIAGNOSIS — G5603 Carpal tunnel syndrome, bilateral upper limbs: Secondary | ICD-10-CM

## 2021-05-15 DIAGNOSIS — Z23 Encounter for immunization: Secondary | ICD-10-CM | POA: Diagnosis not present

## 2021-05-15 DIAGNOSIS — R7303 Prediabetes: Secondary | ICD-10-CM | POA: Diagnosis not present

## 2021-05-15 LAB — POCT GLYCOSYLATED HEMOGLOBIN (HGB A1C): Hemoglobin A1C: 6.1 % — AB (ref 4.0–5.6)

## 2021-05-15 LAB — GLUCOSE, CAPILLARY: Glucose-Capillary: 96 mg/dL (ref 70–99)

## 2021-05-15 MED ORDER — ALBUTEROL SULFATE HFA 108 (90 BASE) MCG/ACT IN AERS: 1.0000 | INHALATION_SPRAY | Freq: Four times a day (QID) | RESPIRATORY_TRACT | 0 refills | Status: DC | PRN

## 2021-05-15 MED ORDER — MYRBETRIQ 50 MG PO TB24: 50.0000 mg | ORAL_TABLET | Freq: Every day | ORAL | 3 refills | Status: DC

## 2021-05-15 MED ORDER — CITALOPRAM HYDROBROMIDE 20 MG PO TABS: ORAL_TABLET | ORAL | 2 refills | Status: DC

## 2021-05-15 MED ORDER — CETIRIZINE HCL 10 MG PO CAPS: 10.0000 mg | ORAL_CAPSULE | Freq: Every day | ORAL | 0 refills | Status: DC

## 2021-05-15 MED ORDER — ROPINIROLE HCL 0.5 MG PO TABS: 0.5000 mg | ORAL_TABLET | Freq: Every day | ORAL | 1 refills | Status: DC

## 2021-05-15 MED ORDER — PANTOPRAZOLE SODIUM 40 MG PO TBEC: DELAYED_RELEASE_TABLET | ORAL | 1 refills | Status: DC

## 2021-05-15 NOTE — Assessment & Plan Note (Addendum)
-   This problems chronic and stable -We will check a lipid panel today -Patient is not on statin at this time.  We will calculate her ASCVD risk score and start her on 1 if required -No further work-up for now  Addendum: -Patient had an LDL of 210.  Given that patient's LDL is over 190 we will start patient on a high intensity statin -I discussed this with the patient over the phone and she is in agreement with plan -We will start the patient on rosuvastatin 20 mg daily and have patient follow-up for repeat lipid in a couple months -The patient's LDL remains elevated she may be candidate for the hyperlipidemia clinic and may require PCSK9 inhibitor

## 2021-05-15 NOTE — Assessment & Plan Note (Signed)
-   This problem is chronic and improved -Patient's PHQ-9 score today was 0 -She is now off Abilify, Wellbutrin and clonidine which were being prescribed by her previous psychiatrist -Patient states that she will make an appointment to follow-up with another psychiatrist in the next month -No further work-up at this time -We will continue to monitor closely

## 2021-05-15 NOTE — Assessment & Plan Note (Signed)
-   Patient complains of epigastric pain and abdominal bloating after eating -She states that she had 1 episode of nausea and vomiting last night after eating with still had some persistent epigastric pain -She also complains of a sensation of not being able to digest her food properly and it sitting in her stomach -On exam, her abdomen is soft, nontender with normoactive bowel sounds -Etiology behind her abdominal pain and bloating remains uncertain.  It is possible that some of her symptoms are secondary to being off her Protonix.  We will restart Protonix for her today -We will also refer her to GI for possible EGD for further work-up of this

## 2021-05-15 NOTE — Assessment & Plan Note (Addendum)
>>  ASSESSMENT AND PLAN FOR GERD (GASTROESOPHAGEAL REFLUX DISEASE) WRITTEN ON 05/15/2021 12:38 PM BY Sian Joles, MD  - This problem is chronic and worsening -Patient states that she was on Protonix 40 mg daily but ran out of the medication -She states that since she ran out she has noted epigastric pain after eating and abdominal bloating -She also complains of a sensation of food being stuck in her stomach and that she has difficulty digesting her food -We will restart her Protonix today and refer her to GI for further evaluation.  I suspect that she would benefit from an EGD to rule out a stricture as well -No further work-up at this time  >>ASSESSMENT AND PLAN FOR ABDOMINAL PAIN WRITTEN ON 05/15/2021 12:44 PM BY Aldine Contes, MD  - Patient complains of epigastric pain and abdominal bloating after eating -She states that she had 1 episode of nausea and vomiting last night after eating with still had some persistent epigastric pain -She also complains of a sensation of not being able to digest her food properly and it sitting in her stomach -On exam, her abdomen is soft, nontender with normoactive bowel sounds -Etiology behind her abdominal pain and bloating remains uncertain.  It is possible that some of her symptoms are secondary to being off her Protonix.  We will restart Protonix for her today -We will also refer her to GI for possible EGD for further work-up of this

## 2021-05-15 NOTE — Patient Instructions (Signed)
-   It was a pleasure seeing you today.  Have a great holiday season! -We will refer you to gastroenterology for your abdominal pain and bloating -We will check some blood work on you today including your kidney function and cholesterol -We will give you a tetanus shot today -We will schedule an appointment in 1 month for your Pap smear -Please obtain your COVID booster from the pharmacy -Please make an appointment to follow-up with your psychiatrist for depression -Please call me if you have any questions or concerns or if you need any refills

## 2021-05-15 NOTE — Assessment & Plan Note (Signed)
-   This problem is improved -Patient states that her cough has now resolved -We will continue to treat her GERD with Protonix -No further work-up at this time

## 2021-05-15 NOTE — Progress Notes (Signed)
° °  Subjective:    Patient ID: Theresa Kirk, female    DOB: [birth year], 60 y.o.   MRN: [id number]  Medication Refill Associated symptoms include abdominal pain.   I have seen and examined this patient.  Patient is here for routine follow-up of her GERD and depression.  Patient states that she has stopped following up with her psychiatrist and taking the medications prescribed by them as she was having adverse reactions to the medication.  She does complain of epigastric pain and abdominal bloating after eating and states that she is run out of her Protonix.  She denies any other complaints at this time.   Review of Systems  Constitutional: Negative.   HENT: Negative.    Respiratory: Negative.    Cardiovascular: Negative.   Gastrointestinal:  Positive for abdominal pain.       Patient complains of epigastric pain and bloating after eating which she attributes to being off her reflux medications  Musculoskeletal: Negative.   Neurological: Negative.   Psychiatric/Behavioral: Negative.        Objective:   Physical Exam Constitutional:      Appearance: Normal appearance.  HENT:     Head: Normocephalic and atraumatic.  Eyes:     Pupils: Pupils are equal, round, and reactive to light.  Cardiovascular:     Rate and Rhythm: Normal rate and regular rhythm.     Heart sounds: Normal heart sounds.  Pulmonary:     Effort: Pulmonary effort is normal.     Breath sounds: Normal breath sounds. No wheezing or rales.  Abdominal:     General: Bowel sounds are normal. There is no distension.     Palpations: Abdomen is soft.     Tenderness: There is no abdominal tenderness.  Musculoskeletal:        General: No swelling or tenderness.     Cervical back: Neck supple. No rigidity.  Neurological:     Mental Status: She is alert and oriented to person, place, and time.  Psychiatric:        Mood and Affect: Mood normal.        Behavior: Behavior normal.          Assessment & Plan:   Please see problem based charting for assessment and plan:

## 2021-05-15 NOTE — Assessment & Plan Note (Signed)
-   This problem is chronic and stable -Patient states that she still has episodes of hot flashes but these are slowly improving -She states that while she was on Celexa her symptoms were much improved and that she would like to go back on this medication -We will restart her on Celexa today -No further work-up at this time
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
## 2021-05-15 NOTE — Assessment & Plan Note (Signed)
-   Patient has a history of bilateral carpal tunnel syndrome and left ulnar nerve entrapment -She had surgery done for her right carpal tunnel syndrome and states that her symptoms of tingling numbness have improved -She still has tingling and numbness in her left arm and hand.  She was supposed to follow-up with a hand surgeon in November but has not received any calls from them -I encouraged her to call the hand surgeon to schedule follow-up and to schedule surgery on her left upper extremity for her ulnar nerve entrapment as well as carpal tunnel syndrome -Patient expressed understanding and is in agreement with plan

## 2021-05-15 NOTE — Assessment & Plan Note (Signed)
-   Patient will schedule a mammogram -She refuses flu shot today -We will give her tetanus booster -Patient will need a Pap smear and I will have her follow-up in 1 month for this -Patient also encouraged to get her COVID booster which she states that she will do at her pharmacy -No further work-up at this time

## 2021-05-15 NOTE — Assessment & Plan Note (Addendum)
-   This problem is chronic and stable -Patient's A1c is 6.1 today -No further work-up at this time  Addendum: -BMP is within normal limits

## 2021-05-16 LAB — LIPID PANEL
Chol/HDL Ratio: 5.5 ratio — ABNORMAL HIGH (ref 0.0–4.4)
Cholesterol, Total: 280 mg/dL — ABNORMAL HIGH (ref 100–199)
HDL: 51 mg/dL (ref >39)
LDL Chol Calc (NIH): 210 mg/dL — ABNORMAL HIGH (ref 0–99)
Triglycerides: 106 mg/dL (ref 0–149)
VLDL Cholesterol Cal: 19 mg/dL (ref 5–40)

## 2021-05-16 LAB — BMP8+ANION GAP
Anion Gap: 17 mmol/L (ref 10.0–18.0)
BUN/Creatinine Ratio: 18 (ref 9–23)
BUN: 14 mg/dL (ref 6–24)
CO2: 23 mmol/L (ref 20–29)
Calcium: 10.1 mg/dL (ref 8.7–10.2)
Chloride: 102 mmol/L (ref 96–106)
Creatinine, Ser: 0.76 mg/dL (ref 0.57–1.00)
Glucose: 91 mg/dL (ref 70–99)
Potassium: 4.5 mmol/L (ref 3.5–5.2)
Sodium: 142 mmol/L (ref 134–144)
eGFR: 90 mL/min/{1.73_m2} (ref >59)

## 2021-05-16 MED ORDER — ALBUTEROL SULFATE HFA 108 (90 BASE) MCG/ACT IN AERS: 1.0000 | INHALATION_SPRAY | Freq: Four times a day (QID) | RESPIRATORY_TRACT | 0 refills | Status: DC | PRN

## 2021-05-16 MED ORDER — CETIRIZINE HCL 10 MG PO CAPS: 10.0000 mg | ORAL_CAPSULE | Freq: Every day | ORAL | 0 refills | Status: DC

## 2021-05-16 MED ORDER — ROSUVASTATIN CALCIUM 20 MG PO TABS: 20.0000 mg | ORAL_TABLET | Freq: Every day | ORAL | 1 refills | Status: DC

## 2021-05-16 NOTE — Addendum Note (Signed)
Addended by: Aldine Contes on: 05/16/2021 09:53 AM   Modules accepted: Orders

## 2021-06-14 ENCOUNTER — Ambulatory Visit
Admission: RE | Admit: 2021-06-14 | Discharge: 2021-06-14 | Disposition: A | Discharge status: Home / Self Care | Payer: Commercial Managed Care - HMO | Source: Ambulatory Visit | Attending: Internal Medicine | Admitting: Internal Medicine

## 2021-06-14 DIAGNOSIS — Z1231 Encounter for screening mammogram for malignant neoplasm of breast: Secondary | ICD-10-CM | POA: Diagnosis not present

## 2021-06-14 DIAGNOSIS — Z0000 Encounter for general adult medical examination without abnormal findings: Secondary | ICD-10-CM

## 2021-06-22 ENCOUNTER — Encounter: Payer: Commercial Managed Care - HMO | Admitting: Internal Medicine

## 2021-06-22 NOTE — Progress Notes (Deleted)
° °  CC: acute bloody BM  HPI:[name] is a 61 y.o. female who presents for evaluation of ***. Please see individual problem based A/P for details.  Acute bloody bm - Hx Gerd - on protonix - epigastric pain, abdmonial bloating - food getting stuck in thorat *** Hemodynamically stable? Still having them? How much bloody? Quality of bloody stool? Been taking the protonix? Any change in stool frequency/consistency/caliber? Anemia? Weightloss? Anyone in the family have GI disease? *** was referred to GI in December, go back to them. EGD even if not for the bloody BM, enough other stuff going on that   HLD - started on Rosuvastatini 20mg  daily - compliant? - need to check lipid panel at next follow up to assess response  Hot flashes - on Celexa  Moles - seen derm?   Depression, PHQ-9: Based on the patients  [facility] Visit from 05/15/2021 in [place]  PHQ-9 Total Score 0      score we have ***.  Past Medical History:  Diagnosis Date   Acute pain of right wrist 02/03/2018   Alcohol abuse    Arthritis    Asthma    Callus of foot 06/07/2008   Qualifier: Diagnosis of  By: Eyvonne Mechanic MD, Vijay     Carpal tunnel syndrome    Cervical lymphadenopathy 09/21/2019   Cervical muscle pain 05/05/2014   - Pt with cervical pain radiating down her neck with no signs of radiculopathy or weakness in upper extremities   Depression    Fibromyalgia    High cholesterol    Review of Systems:   ROS   Physical Exam: There were no vitals filed for this visit.   General: *** HEENT: Conjunctiva nl , antiicteric sclerae, moist mucous membranes, no exudate or erythema Cardiovascular: Normal rate, regular rhythm.  No murmurs, rubs, or gallops Pulmonary : Equal breath sounds, No wheezes, rales, or rhonchi Abdominal: soft, nontender,  bowel sounds present Ext: No edema in lower extremities, no tenderness to palpation of lower extremities.   Assessment &  Plan:   See Encounters Tab for problem based charting.  Patient {GC/GE:[id number]::"discussed with","seen with"} Dr. {IHKVQ:[id number]::"V. Hoffman","Guilloud","Mullen","Narendra","Raines","Vincent","Williams"}

## 2021-06-27 ENCOUNTER — Encounter: Payer: Commercial Managed Care - HMO | Admitting: Internal Medicine

## 2021-08-16 ENCOUNTER — Other Ambulatory Visit: Payer: Self-pay | Admitting: Internal Medicine

## 2021-08-16 NOTE — Telephone Encounter (Signed)
Next appt scheduled 09/18/21 with PCP. ?

## 2021-08-24 ENCOUNTER — Ambulatory Visit (INDEPENDENT_AMBULATORY_CARE_PROVIDER_SITE_OTHER): Payer: Medicare Other | Admitting: *Deleted

## 2021-08-24 ENCOUNTER — Encounter: Payer: Self-pay | Admitting: *Deleted

## 2021-08-24 DIAGNOSIS — Z0000 Encounter for general adult medical examination without abnormal findings: Secondary | ICD-10-CM | POA: Diagnosis not present

## 2021-08-24 NOTE — Progress Notes (Deleted)
Subjective:   Theresa Kirk is a 61 y.o. female who presents for an Initial Medicare Annual Wellness Visit.  Review of Systems    ***       Objective:    There were no vitals filed for this visit. There is no height or weight on file to calculate BMI.     05/15/2021    9:13 AM 01/05/2021   12:26 PM 12/25/2020   11:46 AM 08/14/2020    8:57 AM 07/26/2020   10:27 AM 05/09/2020   12:11 PM 02/27/2020   11:25 AM  Advanced Directives  Does Patient Have a Medical Advance Directive? No No No No No No No  Would patient like information on creating a medical advance directive? No - Patient declined No - Patient declined No - Patient declined No - Patient declined No - Patient declined No - Patient declined     Current Medications (verified) Outpatient Encounter Medications as of 08/24/2021  Medication Sig  . albuterol (VENTOLIN HFA) 108 (90 Base) MCG/ACT inhaler Inhale 1-2 puffs into the lungs every 6 (six) hours as needed for wheezing or shortness of breath.  . ARIPiprazole (ABILIFY) 10 MG tablet Take 1 tablet (10 mg total) by mouth daily.  Marland Kitchen buPROPion (WELLBUTRIN) 75 MG tablet Take 1 tablet (75 mg total) by mouth daily.  . Cetirizine HCl 10 MG CAPS Take 1 capsule (10 mg total) by mouth daily.  . citalopram (CELEXA) 20 MG tablet TAKE 1 TABLET(20 MG) BY MOUTH DAILY  . cloNIDine (CATAPRES) 0.2 MG tablet Take 1 tablet (0.2 mg total) by mouth 2 (two) times daily.  Marland Kitchen MYRBETRIQ 50 MG TB24 tablet Take 1 tablet (50 mg total) by mouth daily.  . pantoprazole (PROTONIX) 40 MG tablet TAKE 1 TABLET BY MOUTH EVERY DAY(TAKE 30 MINS BEFORE MEALS)  . rOPINIRole (REQUIP) 0.5 MG tablet TAKE 1 TABLET(0.5 MG) BY MOUTH AT BEDTIME  . rosuvastatin (CRESTOR) 20 MG tablet Take 1 tablet (20 mg total) by mouth daily.   No facility-administered encounter medications on file as of 08/24/2021.    Allergies (verified) Iohexol and Ibuprofen   History: Past Medical History:  Diagnosis Date  . Acute pain of right  wrist 02/03/2018  . Alcohol abuse   . Arthritis   . Asthma   . Callus of foot 06/07/2008   Qualifier: Diagnosis of  By: Comer Locket MD, Vijay    . Carpal tunnel syndrome   . Cervical lymphadenopathy 09/21/2019  . Cervical muscle pain 05/05/2014   - Pt with cervical pain radiating down her neck with no signs of radiculopathy or weakness in upper extremities  . Depression   . Fibromyalgia   . High cholesterol    Past Surgical History:  Procedure Laterality Date  . ABDOMINAL HYSTERECTOMY  2008   h/o uterine fibroids, ?partial hysterectomy per pt 04/13/12  . CARPAL TUNNEL RELEASE Right 01/05/2021   Procedure: RIGHT CARPAL TUNNEL RELEASE;  Surgeon: Betha Loa, MD;  Location: [facility];  Service: Orthopedics;  Laterality: Right;  30 MIN  . COLONOSCOPY    . FOOT SURGERY Right   . KNEE SURGERY     Family History  Problem Relation Age of Onset  . Diabetes Mother   . Hypertension Mother   . Stroke Mother   . Diabetes Sister   . Thyroid disease Sister   . Hypertension Sister   . Cancer Maternal Aunt        unknown  . Breast cancer Maternal Aunt    Social  History   Socioeconomic History  . Marital status: Single    Spouse name: Not on file  . Number of children: Not on file  . Years of education: Not on file  . Highest education level: Not on file  Occupational History  . Not on file  Tobacco Use  . Smoking status: Former    Types: Cigarettes    Quit date: 06/03/2000    Years since quitting: 21.2  . Smokeless tobacco: Never  Substance and Sexual Activity  . Alcohol use: Yes    Comment: former heavy drinker; but not current  . Drug use: Yes    Types: Marijuana    Comment: 01/04/2021  . Sexual activity: Not on file  Other Topics Concern  . Not on file  Social History Narrative   Lives with mother in [place].   Currently unemployed.   Used to work at [employer] in [place].   ETOH use: weekend, 1-2 beers from [employer]: used to drink everyday   Former smoker    Current THC use   H/o cocaine use: quit in 2001.   Social Determinants of Health   Financial Resource Strain: Not on file  Food Insecurity: Not on file  Transportation Needs: Not on file  Physical Activity: Not on file  Stress: Not on file  Social Connections: Not on file    Tobacco Counseling Counseling given: Not Answered   Clinical Intake:                 Diabetic?***         Activities of Daily Living    05/15/2021    9:15 AM 01/05/2021   12:30 PM  In your present state of health, do you have any difficulty performing the following activities:  Hearing? 0 0  Vision? 0 0  Difficulty concentrating or making decisions? 0 0  Walking or climbing stairs? 0 0  Dressing or bathing? 0 0  Doing errands, shopping? 0     Patient Care Team: Earl Lagos, MD as PCP - General  Indicate any recent Medical Services you may have received from other than [facility] providers in the past year (date may be approximate).     Assessment:   This is a routine wellness examination for Theresa Kirk.  Hearing/Vision screen No results found.  Dietary issues and exercise activities discussed:     Goals Addressed   None   Depression Screen    05/15/2021    9:22 AM 08/14/2020    8:56 AM 09/21/2019   10:00 AM 04/20/2019   12:57 PM 07/13/2018   10:26 AM 06/23/2018    8:52 AM  PHQ 2/9 Scores  PHQ - 2 Score 0 0 3 3 3 3   PHQ- 9 Score 0 0 15 16 18 16     Fall Risk    05/15/2021    9:15 AM 08/14/2020    8:56 AM 07/26/2020   10:26 AM 05/09/2020   12:12 PM 10/13/2019    1:21 PM  Fall Risk   Falls in the past year? 0 0 0 0 0  Number falls in past yr: 0   0   Injury with Fall? 0   0   Risk for fall due to :  No Fall Risks Impaired balance/gait;Impaired mobility  Impaired balance/gait  Risk for fall due to: Comment   2/2 back pain    Follow up Falls evaluation completed  Falls prevention discussed  Falls prevention discussed    FALL RISK PREVENTION PERTAINING TO THE  HOME:  Any stairs in or around the home? {YES/NO:21197} If so, are there any without handrails? {YES/NO:21197} Home free of loose throw rugs in walkways, pet beds, electrical cords, etc? {YES/NO:21197} Adequate lighting in your home to reduce risk of falls? {YES/NO:21197}  ASSISTIVE DEVICES UTILIZED TO PREVENT FALLS:  Life alert? {YES/NO:21197} Use of a cane, walker or w/c? {YES/NO:21197} Grab bars in the bathroom? {YES/NO:21197} Shower chair or bench in shower? {YES/NO:21197} Elevated toilet seat or a handicapped toilet? {YES/NO:21197}  TIMED UP AND GO:  Was the test performed? {YES/NO:21197}.  Length of time to ambulate 10 feet: *** sec.   {Appearance of YNWG:9562130}  Cognitive Function:        Immunizations Immunization History  Administered Date(s) Administered  . Influenza Split 03/07/2011, 04/13/2012  . Influenza Whole 04/02/2007, 03/16/2008, 01/30/2010  . Influenza,inj,Quad PF,6+ Mos 05/05/2014, 03/03/2015, 05/21/2016, 02/27/2017, 02/03/2018, 02/16/2019  . PFIZER(Purple Top)SARS-COV-2 Vaccination 09/02/2019, 09/28/2019  . Td 04/30/2010  . Tdap 05/15/2021    {TDAP status:2101805}  {Flu Vaccine status:2101806}  {Pneumococcal vaccine status:2101807}  {Covid-19 vaccine status:2101808}  Qualifies for Shingles Vaccine? {YES/NO:21197}  Zostavax completed {YES/NO:21197}  {Shingrix Completed?:2101804}  Screening Tests Health Maintenance  Topic Date Due  . Zoster Vaccines- Shingrix (1 of 2) Never done  . PAP SMEAR-Modifier  08/23/2019  . COVID-19 Vaccine (3 - Pfizer risk series) 10/26/2019  . INFLUENZA VACCINE  08/31/2021 (Originally 01/01/2021)  . COLONOSCOPY (Pts 45-33yrs Insurance coverage will need to be confirmed)  04/16/2022  . MAMMOGRAM  06/15/2023  . TETANUS/TDAP  05/16/2031  . Hepatitis C Screening  Completed  . HIV Screening  Completed  . HPV VACCINES  Aged Out    Health Maintenance  Health Maintenance Due  Topic Date Due  . Zoster Vaccines-  Shingrix (1 of 2) Never done  . PAP SMEAR-Modifier  08/23/2019  . COVID-19 Vaccine (3 - Pfizer risk series) 10/26/2019    {Colorectal cancer screening:2101809}  {Mammogram status:21018020}  {Bone Density status:21018021}  Lung Cancer Screening: (Low Dose CT Chest recommended if Age 47-80 years, 30 pack-year currently smoking OR have quit w/in 15years.) {DOES NOT does:27190::"does not"} qualify.   Lung Cancer Screening Referral: ***  Additional Screening:  Hepatitis C Screening: {DOES NOT does:27190::"does not"} qualify; Completed ***  Vision Screening: Recommended annual ophthalmology exams for early detection of glaucoma and other disorders of the eye. Is the patient up to date with their annual eye exam?  {YES/NO:21197} Who is the provider or what is the name of the office in which the patient attends annual eye exams? *** If pt is not established with a provider, would they like to be referred to a provider to establish care? {YES/NO:21197}.   Dental Screening: Recommended annual dental exams for proper oral hygiene  Community Resource Referral / Chronic Care Management: CRR required this visit?  {YES/NO:21197}  CCM required this visit?  {YES/NO:21197}     Plan:     I have personally reviewed and noted the following in the patient's chart:   Medical and social history Use of alcohol, tobacco or illicit drugs  Current medications and supplements including opioid prescriptions. {Opioid Prescriptions:[phone]} Functional ability and status Nutritional status Physical activity Advanced directives List of other physicians Hospitalizations, surgeries, and ER visits in previous 12 months Vitals Screenings to include cognitive, depression, and falls Referrals and appointments  In addition, I have reviewed and discussed with patient certain preventive protocols, quality metrics, and best practice recommendations. A written personalized care plan for preventive services  as well as general preventive  health recommendations were provided to patient.     Kingsley Spittle [place], [place]   08/24/2021   Nurse Notes: ***        Subjective:   Theresa Kirk is a 61 y.o. female who presents for an Initial Medicare Annual Wellness Visit.  Review of Systems    ***       Objective:    There were no vitals filed for this visit. There is no height or weight on file to calculate BMI.     05/15/2021    9:13 AM 01/05/2021   12:26 PM 12/25/2020   11:46 AM 08/14/2020    8:57 AM 07/26/2020   10:27 AM 05/09/2020   12:11 PM 02/27/2020   11:25 AM  Advanced Directives  Does Patient Have a Medical Advance Directive? No No No No No No No  Would patient like information on creating a medical advance directive? No - Patient declined No - Patient declined No - Patient declined No - Patient declined No - Patient declined No - Patient declined     Current Medications (verified) Outpatient Encounter Medications as of 08/24/2021  Medication Sig  . albuterol (VENTOLIN HFA) 108 (90 Base) MCG/ACT inhaler Inhale 1-2 puffs into the lungs every 6 (six) hours as needed for wheezing or shortness of breath.  . ARIPiprazole (ABILIFY) 10 MG tablet Take 1 tablet (10 mg total) by mouth daily.  Marland Kitchen buPROPion (WELLBUTRIN) 75 MG tablet Take 1 tablet (75 mg total) by mouth daily.  . Cetirizine HCl 10 MG CAPS Take 1 capsule (10 mg total) by mouth daily.  . citalopram (CELEXA) 20 MG tablet TAKE 1 TABLET(20 MG) BY MOUTH DAILY  . cloNIDine (CATAPRES) 0.2 MG tablet Take 1 tablet (0.2 mg total) by mouth 2 (two) times daily.  Marland Kitchen MYRBETRIQ 50 MG TB24 tablet Take 1 tablet (50 mg total) by mouth daily.  . pantoprazole (PROTONIX) 40 MG tablet TAKE 1 TABLET BY MOUTH EVERY DAY(TAKE 30 MINS BEFORE MEALS)  . rOPINIRole (REQUIP) 0.5 MG tablet TAKE 1 TABLET(0.5 MG) BY MOUTH AT BEDTIME  . rosuvastatin (CRESTOR) 20 MG tablet Take 1 tablet (20 mg total) by mouth daily.   No facility-administered encounter  medications on file as of 08/24/2021.    Allergies (verified) Iohexol and Ibuprofen   History: Past Medical History:  Diagnosis Date  . Acute pain of right wrist 02/03/2018  . Alcohol abuse   . Arthritis   . Asthma   . Callus of foot 06/07/2008   Qualifier: Diagnosis of  By: Comer Locket MD, Vijay    . Carpal tunnel syndrome   . Cervical lymphadenopathy 09/21/2019  . Cervical muscle pain 05/05/2014   - Pt with cervical pain radiating down her neck with no signs of radiculopathy or weakness in upper extremities  . Depression   . Fibromyalgia   . High cholesterol    Past Surgical History:  Procedure Laterality Date  . ABDOMINAL HYSTERECTOMY  2008   h/o uterine fibroids, ?partial hysterectomy per pt 04/13/12  . CARPAL TUNNEL RELEASE Right 01/05/2021   Procedure: RIGHT CARPAL TUNNEL RELEASE;  Surgeon: Betha Loa, MD;  Location: [facility];  Service: Orthopedics;  Laterality: Right;  30 MIN  . COLONOSCOPY    . FOOT SURGERY Right   . KNEE SURGERY     Family History  Problem Relation Age of Onset  . Diabetes Mother   . Hypertension Mother   . Stroke Mother   . Diabetes Sister   . Thyroid disease Sister   .  Hypertension Sister   . Cancer Maternal Aunt        unknown  . Breast cancer Maternal Aunt    Social History   Socioeconomic History  . Marital status: Single    Spouse name: Not on file  . Number of children: Not on file  . Years of education: Not on file  . Highest education level: Not on file  Occupational History  . Not on file  Tobacco Use  . Smoking status: Former    Types: Cigarettes    Quit date: 06/03/2000    Years since quitting: 21.2  . Smokeless tobacco: Never  Substance and Sexual Activity  . Alcohol use: Yes    Comment: former heavy drinker; but not current  . Drug use: Yes    Types: Marijuana    Comment: 01/04/2021  . Sexual activity: Not on file  Other Topics Concern  . Not on file  Social History Narrative   Lives with mother in  [place].   Currently unemployed.   Used to work at [employer] in [place].   ETOH use: weekend, 1-2 beers from [employer]: used to drink everyday   Former smoker   Current THC use   H/o cocaine use: quit in 2001.   Social Determinants of Health   Financial Resource Strain: Not on file  Food Insecurity: Not on file  Transportation Needs: Not on file  Physical Activity: Not on file  Stress: Not on file  Social Connections: Not on file    Tobacco Counseling Counseling given: Not Answered   Clinical Intake:                 Diabetic?***         Activities of Daily Living    05/15/2021    9:15 AM 01/05/2021   12:30 PM  In your present state of health, do you have any difficulty performing the following activities:  Hearing? 0 0  Vision? 0 0  Difficulty concentrating or making decisions? 0 0  Walking or climbing stairs? 0 0  Dressing or bathing? 0 0  Doing errands, shopping? 0     Patient Care Team: Earl Lagos, MD as PCP - General  Indicate any recent Medical Services you may have received from other than [facility] providers in the past year (date may be approximate).     Assessment:   This is a routine wellness examination for Theresa Kirk.  Hearing/Vision screen No results found.  Dietary issues and exercise activities discussed:     Goals Addressed   None   Depression Screen    05/15/2021    9:22 AM 08/14/2020    8:56 AM 09/21/2019   10:00 AM 04/20/2019   12:57 PM 07/13/2018   10:26 AM 06/23/2018    8:52 AM  PHQ 2/9 Scores  PHQ - 2 Score 0 0 3 3 3 3   PHQ- 9 Score 0 0 15 16 18 16     Fall Risk    05/15/2021    9:15 AM 08/14/2020    8:56 AM 07/26/2020   10:26 AM 05/09/2020   12:12 PM 10/13/2019    1:21 PM  Fall Risk   Falls in the past year? 0 0 0 0 0  Number falls in past yr: 0   0   Injury with Fall? 0   0   Risk for fall due to :  No Fall Risks Impaired balance/gait;Impaired mobility  Impaired balance/gait  Risk for fall due to: Comment    2/2  back pain    Follow up Falls evaluation completed  Falls prevention discussed  Falls prevention discussed    FALL RISK PREVENTION PERTAINING TO THE HOME:  Any stairs in or around the home? {YES/NO:21197} If so, are there any without handrails? {YES/NO:21197} Home free of loose throw rugs in walkways, pet beds, electrical cords, etc? {YES/NO:21197} Adequate lighting in your home to reduce risk of falls? {YES/NO:21197}  ASSISTIVE DEVICES UTILIZED TO PREVENT FALLS:  Life alert? {YES/NO:21197} Use of a cane, walker or w/c? {YES/NO:21197} Grab bars in the bathroom? {YES/NO:21197} Shower chair or bench in shower? {YES/NO:21197} Elevated toilet seat or a handicapped toilet? {YES/NO:21197}  TIMED UP AND GO:  Was the test performed? {YES/NO:21197}.  Length of time to ambulate 10 feet: *** sec.   {Appearance of XLKG:4010272}  Cognitive Function:        Immunizations Immunization History  Administered Date(s) Administered  . Influenza Split 03/07/2011, 04/13/2012  . Influenza Whole 04/02/2007, 03/16/2008, 01/30/2010  . Influenza,inj,Quad PF,6+ Mos 05/05/2014, 03/03/2015, 05/21/2016, 02/27/2017, 02/03/2018, 02/16/2019  . PFIZER(Purple Top)SARS-COV-2 Vaccination 09/02/2019, 09/28/2019  . Td 04/30/2010  . Tdap 05/15/2021    {TDAP status:2101805}  {Flu Vaccine status:2101806}  {Pneumococcal vaccine status:2101807}  {Covid-19 vaccine status:2101808}  Qualifies for Shingles Vaccine? {YES/NO:21197}  Zostavax completed {YES/NO:21197}  {Shingrix Completed?:2101804}  Screening Tests Health Maintenance  Topic Date Due  . Zoster Vaccines- Shingrix (1 of 2) Never done  . PAP SMEAR-Modifier  08/23/2019  . COVID-19 Vaccine (3 - Pfizer risk series) 10/26/2019  . INFLUENZA VACCINE  08/31/2021 (Originally 01/01/2021)  . COLONOSCOPY (Pts 45-50yrs Insurance coverage will need to be confirmed)  04/16/2022  . MAMMOGRAM  06/15/2023  . TETANUS/TDAP  05/16/2031  . Hepatitis C Screening   Completed  . HIV Screening  Completed  . HPV VACCINES  Aged Out    Health Maintenance  Health Maintenance Due  Topic Date Due  . Zoster Vaccines- Shingrix (1 of 2) Never done  . PAP SMEAR-Modifier  08/23/2019  . COVID-19 Vaccine (3 - Pfizer risk series) 10/26/2019    {Colorectal cancer screening:2101809}  {Mammogram status:21018020}  {Bone Density status:21018021}  Lung Cancer Screening: (Low Dose CT Chest recommended if Age 67-80 years, 30 pack-year currently smoking OR have quit w/in 15years.) {DOES NOT does:27190::"does not"} qualify.   Lung Cancer Screening Referral: ***  Additional Screening:  Hepatitis C Screening: {DOES NOT does:27190::"does not"} qualify; Completed ***  Vision Screening: Recommended annual ophthalmology exams for early detection of glaucoma and other disorders of the eye. Is the patient up to date with their annual eye exam?  {YES/NO:21197} Who is the provider or what is the name of the office in which the patient attends annual eye exams? *** If pt is not established with a provider, would they like to be referred to a provider to establish care? {YES/NO:21197}.   Dental Screening: Recommended annual dental exams for proper oral hygiene  Community Resource Referral / Chronic Care Management: CRR required this visit?  {YES/NO:21197}  CCM required this visit?  {YES/NO:21197}     Plan:     I have personally reviewed and noted the following in the patient's chart:   Medical and social history Use of alcohol, tobacco or illicit drugs  Current medications and supplements including opioid prescriptions. {Opioid Prescriptions:[phone]} Functional ability and status Nutritional status Physical activity Advanced directives List of other physicians Hospitalizations, surgeries, and ER visits in previous 12 months Vitals Screenings to include cognitive, depression, and falls Referrals and appointments  In addition, I have reviewed  and  discussed with patient certain preventive protocols, quality metrics, and best practice recommendations. A written personalized care plan for preventive services as well as general preventive health recommendations were provided to patient.     Kingsley Spittle [place], [place]   08/24/2021   Nurse Notes: ***

## 2021-08-24 NOTE — Progress Notes (Addendum)
? ? ? ? ? ?Subjective:  ? Theresa Kirk is a 61 y.o. female who presents for an Initial Medicare Annual Wellness Visit. ?I connected with  Micole Cressler on 08/24/21 by a audio enabled telemedicine application and verified that I am speaking with the correct person using two identifiers. ? ?Patient Location: Home ? ?Provider Location: Office/Clinic ? ?I discussed the limitations of evaluation and management by telemedicine. The patient expressed understanding and agreed to proceed.  ?Review of Systems    ?Defer to pcp  ?  ? ?   ?Objective:  ?  ?There were no vitals filed for this visit. ?There is no height or weight on file to calculate BMI. ? ? ?  05/15/2021  ?  9:13 AM 01/05/2021  ? 12:26 PM 12/25/2020  ? 11:46 AM 08/14/2020  ?  8:57 AM 07/26/2020  ? 10:27 AM 05/09/2020  ? 12:11 PM 02/27/2020  ? 11:25 AM  ?Advanced Directives  ?Does Patient Have a Medical Advance Directive? No No No No No No No  ?Would patient like information on creating a medical advance directive? No - Patient declined No - Patient declined No - Patient declined No - Patient declined No - Patient declined No - Patient declined   ? ? ?Current Medications (verified) ?Outpatient Encounter Medications as of 08/24/2021  ?Medication Sig  ? albuterol (VENTOLIN HFA) 108 (90 Base) MCG/ACT inhaler Inhale 1-2 puffs into the lungs every 6 (six) hours as needed for wheezing or shortness of breath.  ? ARIPiprazole (ABILIFY) 10 MG tablet Take 1 tablet (10 mg total) by mouth daily.  ? buPROPion (WELLBUTRIN) 75 MG tablet Take 1 tablet (75 mg total) by mouth daily.  ? Cetirizine HCl 10 MG CAPS Take 1 capsule (10 mg total) by mouth daily.  ? citalopram (CELEXA) 20 MG tablet TAKE 1 TABLET(20 MG) BY MOUTH DAILY  ? cloNIDine (CATAPRES) 0.2 MG tablet Take 1 tablet (0.2 mg total) by mouth 2 (two) times daily.  ? MYRBETRIQ 50 MG TB24 tablet Take 1 tablet (50 mg total) by mouth daily.  ? pantoprazole (PROTONIX) 40 MG tablet TAKE 1 TABLET BY MOUTH EVERY DAY(TAKE 30 MINS  BEFORE MEALS)  ? rOPINIRole (REQUIP) 0.5 MG tablet TAKE 1 TABLET(0.5 MG) BY MOUTH AT BEDTIME  ? rosuvastatin (CRESTOR) 20 MG tablet Take 1 tablet (20 mg total) by mouth daily.  ? ?No facility-administered encounter medications on file as of 08/24/2021.  ? ? ?Allergies (verified) ?Iohexol and Ibuprofen  ? ?History: ?Past Medical History:  ?Diagnosis Date  ? Acute pain of right wrist 02/03/2018  ? Alcohol abuse   ? Arthritis   ? Asthma   ? Callus of foot 06/07/2008  ? Qualifier: Diagnosis of  By: Eyvonne Mechanic MD, Vijay    ? Carpal tunnel syndrome   ? Cervical lymphadenopathy 09/21/2019  ? Cervical muscle pain 05/05/2014  ? - Pt with cervical pain radiating down her neck with no signs of radiculopathy or weakness in upper extremities  ? Depression   ? Fibromyalgia   ? High cholesterol   ? ?Past Surgical History:  ?Procedure Laterality Date  ? ABDOMINAL HYSTERECTOMY  2008  ? h/o uterine fibroids, ?partial hysterectomy per pt 04/13/12  ? CARPAL TUNNEL RELEASE Right 01/05/2021  ? Procedure: RIGHT CARPAL TUNNEL RELEASE;  Surgeon: [name], MD;  Location: [facility];  Service: Orthopedics;  Laterality: Right;  30 MIN  ? COLONOSCOPY    ? FOOT SURGERY Right   ? KNEE SURGERY    ? ?Family  History  ?Problem Relation Age of Onset  ? Diabetes Mother   ? Hypertension Mother   ? Stroke Mother   ? Diabetes Sister   ? Thyroid disease Sister   ? Hypertension Sister   ? Cancer Maternal Aunt   ?     unknown  ? Breast cancer Maternal Aunt   ? ?Social History  ? ?Socioeconomic History  ? Marital status: Single  ?  Spouse name: Not on file  ? Number of children: Not on file  ? Years of education: Not on file  ? Highest education level: Not on file  ?Occupational History  ? Not on file  ?Tobacco Use  ? Smoking status: Former  ?  Types: Cigarettes  ?  Quit date: 06/03/2000  ?  Years since quitting: 21.2  ? Smokeless tobacco: Never  ?Substance and Sexual Activity  ? Alcohol use: Yes  ?  Comment: former heavy drinker; but not current  ?  Drug use: Yes  ?  Types: Marijuana  ?  Comment: 01/04/2021  ? Sexual activity: Not on file  ?Other Topics Concern  ? Not on file  ?Social History Narrative  ? Lives with mother in [place].  ? Currently unemployed.  ? Used to work at [employer] in [place].  ? ETOH use: weekend, 1-2 beers from [employer]: used to drink everyday  ? Former smoker  ? Current THC use  ? H/o cocaine use: quit in 2001.  ? ?Social Determinants of Health  ? ?Financial Resource Strain: Medium Risk  ? Difficulty of Paying Living Expenses: Somewhat hard  ?Food Insecurity: No Food Insecurity  ? Worried About Charity fundraiser in the Last Year: Never true  ? Ran Out of Food in the Last Year: Never true  ?Transportation Needs: No Transportation Needs  ? Lack of Transportation (Medical): No  ? Lack of Transportation (Non-Medical): No  ?Physical Activity: Inactive  ? Days of Exercise per Week: 0 days  ? Minutes of Exercise per Session: 0 min  ?Stress: Stress Concern Present  ? Feeling of Stress : Very much  ?Social Connections: Moderately Isolated  ? Frequency of Communication with Friends and Family: More than three times a week  ? Frequency of Social Gatherings with Friends and Family: Once a week  ? Attends Religious Services: Never  ? Active Member of Clubs or Organizations: No  ? Attends Archivist Meetings: Never  ? Marital Status: Married  ? ? ?Tobacco Counseling ?Counseling given: Not Answered ? ? ?Clinical Intake: ? ?Pre-visit preparation completed: Yes ? ?Pain : 0-10 ?Pain Type: Chronic pain ?Pain Location: Arm ?Pain Orientation: Left, Right ? ?  ? ?Diabetes: No ? ?How often do you need to have someone help you when you read instructions, pamphlets, or other written materials from your doctor or pharmacy?: 3 - Sometimes ?What is the last grade level you completed in school?: 9th grade ? ?Diabetic?no ? ?Interpreter Needed?: No ? ?Comments: [email or web address] ?Information entered by :: k ? ? ?Activities of Daily Living ? ?  05/15/2021  ?   9:15 AM 01/05/2021  ? 12:30 PM  ?In your present state of health, do you have any difficulty performing the following activities:  ?Hearing? 0 0  ?Vision? 0 0  ?Difficulty concentrating or making decisions? 0 0  ?Walking or climbing stairs? 0 0  ?Dressing or bathing? 0 0  ?Doing errands, shopping? 0   ? ? ?Patient Care Team: ?Aldine Contes, MD as PCP - General ? ?Indicate any recent  Medical Services you may have received from other than [facility] providers in the past year (date may be approximate). ? ?   ?Assessment:  ? This is a routine wellness examination for Venesha. ? ?Hearing/Vision screen ?No results found. ? ?Dietary issues and exercise activities discussed: ?  ? ? Goals Addressed   ?None ?  ?Depression Screen ? ?  08/24/2021  ?  2:06 PM 05/15/2021  ?  9:22 AM 08/14/2020  ?  8:56 AM 09/21/2019  ? 10:00 AM 04/20/2019  ? 12:57 PM 07/13/2018  ? 10:26 AM  ?PHQ 2/9 Scores  ?PHQ - 2 Score 2 0 0 '3 3 3  '$ ?PHQ- 9 Score 11 0 0 '15 16 18  '$ ?  ?Fall Risk ? ?  08/24/2021  ?  2:01 PM 05/15/2021  ?  9:15 AM 08/14/2020  ?  8:56 AM 07/26/2020  ? 10:26 AM 05/09/2020  ? 12:12 PM  ?Fall Risk   ?Falls in the past year? 0 0 0 0 0  ?Number falls in past yr: 0 0   0  ?Injury with Fall? 0 0   0  ?Risk for fall due to : No Fall Risks  No Fall Risks Impaired balance/gait;Impaired mobility   ?Risk for fall due to: Comment    2/2 back pain   ?Follow up  Falls evaluation completed  Falls prevention discussed   ? ? ?FALL RISK PREVENTION PERTAINING TO THE HOME: ? ?Any stairs in or around the home? No  ?If so, are there any without handrails? No  ?Home free of loose throw rugs in walkways, pet beds, electrical cords, etc? Yes  ?Adequate lighting in your home to reduce risk of falls? Yes  ? ?ASSISTIVE DEVICES UTILIZED TO PREVENT FALLS: ? ?Life alert? No  ?Use of a cane, walker or w/c? No  ?Grab bars in the bathroom? No  ?Shower chair or bench in shower? No  ?Elevated toilet seat or a handicapped toilet? No  ? ?TIMED UP AND GO: ? ?Was the test performed?   NA .  ?Length of time to ambulate 10 feet: NA ? ? ? ?Cognitive Function: ?  ?  ?  ? ?Immunizations ?Immunization History  ?Administered Date(s) Administered  ? Influenza Split 03/07/2011, 04/13/2012  ? Inf

## 2021-09-18 ENCOUNTER — Encounter: Payer: Self-pay | Admitting: Internal Medicine

## 2021-09-18 ENCOUNTER — Other Ambulatory Visit (HOSPITAL_COMMUNITY)
Admission: RE | Admit: 2021-09-18 | Discharge: 2021-09-18 | Disposition: A | Discharge status: Home / Self Care | Payer: Medicare Other | Source: Ambulatory Visit | Attending: Internal Medicine | Admitting: Internal Medicine

## 2021-09-18 ENCOUNTER — Ambulatory Visit (HOSPITAL_COMMUNITY)
Admission: RE | Admit: 2021-09-18 | Discharge: 2021-09-18 | Disposition: A | Discharge status: Home / Self Care | Payer: Medicare Other | Source: Ambulatory Visit | Attending: Internal Medicine | Admitting: Internal Medicine

## 2021-09-18 ENCOUNTER — Ambulatory Visit (INDEPENDENT_AMBULATORY_CARE_PROVIDER_SITE_OTHER): Payer: Medicare Other | Admitting: Internal Medicine

## 2021-09-18 VITALS — BP 125/78 | HR 72 | Temp 97.5°F | Ht 65.0 in | Wt 198.9 lb

## 2021-09-18 DIAGNOSIS — R252 Cramp and spasm: Secondary | ICD-10-CM

## 2021-09-18 DIAGNOSIS — R232 Flushing: Secondary | ICD-10-CM

## 2021-09-18 DIAGNOSIS — G5603 Carpal tunnel syndrome, bilateral upper limbs: Secondary | ICD-10-CM | POA: Diagnosis not present

## 2021-09-18 DIAGNOSIS — L84 Corns and callosities: Secondary | ICD-10-CM | POA: Diagnosis not present

## 2021-09-18 DIAGNOSIS — K219 Gastro-esophageal reflux disease without esophagitis: Secondary | ICD-10-CM

## 2021-09-18 DIAGNOSIS — Z1151 Encounter for screening for human papillomavirus (HPV): Secondary | ICD-10-CM | POA: Insufficient documentation

## 2021-09-18 DIAGNOSIS — R1013 Epigastric pain: Secondary | ICD-10-CM

## 2021-09-18 DIAGNOSIS — M25562 Pain in left knee: Secondary | ICD-10-CM | POA: Diagnosis not present

## 2021-09-18 DIAGNOSIS — Z124 Encounter for screening for malignant neoplasm of cervix: Secondary | ICD-10-CM

## 2021-09-18 DIAGNOSIS — F32A Depression, unspecified: Secondary | ICD-10-CM

## 2021-09-18 DIAGNOSIS — M25561 Pain in right knee: Secondary | ICD-10-CM | POA: Diagnosis not present

## 2021-09-18 DIAGNOSIS — G8929 Other chronic pain: Secondary | ICD-10-CM

## 2021-09-18 DIAGNOSIS — E785 Hyperlipidemia, unspecified: Secondary | ICD-10-CM | POA: Diagnosis not present

## 2021-09-18 DIAGNOSIS — M542 Cervicalgia: Secondary | ICD-10-CM | POA: Diagnosis not present

## 2021-09-18 DIAGNOSIS — Z0000 Encounter for general adult medical examination without abnormal findings: Secondary | ICD-10-CM

## 2021-09-18 DIAGNOSIS — Z01419 Encounter for gynecological examination (general) (routine) without abnormal findings: Secondary | ICD-10-CM | POA: Diagnosis not present

## 2021-09-18 MED ORDER — CYCLOBENZAPRINE HCL 5 MG PO TABS: 5.0000 mg | ORAL_TABLET | Freq: Three times a day (TID) | ORAL | 0 refills | Status: DC | PRN

## 2021-09-18 NOTE — Assessment & Plan Note (Addendum)
Patient endorses abdominal pain and sensation of food getting stuck in her stomach after eating. She pointed midline distal to the sternum as she described this. She takes Protonix for GERD. She also takes Miralax for constipation and believes this helps somewhat. She is denying abdominal pain with no tenderness on exam. Placed referral to gastroenterology at prior visit and told patient if she has not been contacted by the time she has her next visit here, we will place another referral.  ?

## 2021-09-18 NOTE — Assessment & Plan Note (Addendum)
Patient had an elevated LDL at prior visit and was started on high-intensity statin. She says she has not taken her statin in over a week because she has been concerned that it would interact with Aleve she was taking for pain. Ordered lipid panel today and CMP. ? ?Plan ?- Follow-up lipid panel and CMP results ?- Continue Crestor '20mg'$  daily ?- Encouraged patient to take statin, even if taking Aleve, given lack of contraindications ? ?Addendum: ?-Patient LDL still elevated at 186 but patient has not been taking statin regularly.  We will encourage patient to take statin regularly and recheck her lipid panel at her next visit. ?-If patient's LDL still remains elevated at next visit despite compliance with statin would consider referral to lipid clinic for possible PCSK9 initiation ?-Results discussed with patient who is in agreement with plan ?

## 2021-09-18 NOTE — Assessment & Plan Note (Signed)
Pap exam completed today. She was overdue with last pap in 08/2016. Patient had significant R leg cramping prior to pap exam, so decreased ROM in leg made positioning for pap difficult. Collected specimen and will contact patients with results. ? ?Plan  ?- Follow-up pap results ?

## 2021-09-18 NOTE — Assessment & Plan Note (Addendum)
Patient endorsing leg cramping that is activity-limiting. She says this problem is chronic and has been ongoing. She had significant R leg cramping prior to pap exam, so decreased ROM in leg made positioning for pap difficult. The cramping lasted several minutes and appeared to be localized in posterior thigh. Ordered CMP to check for electrolyte imbalances. ? ?Addendum: ?-CMP is within normal limits.  Etiology behind cramping remains uncertain at this time.  We will continue to monitor closely. ? ?Plan ?- Follow-up CMP results ?

## 2021-09-18 NOTE — Progress Notes (Signed)
? ?This is a Careers information officer Note.  The care of the patient was discussed with Dr. Aldine Contes, MD and the assessment and plan was formulated with their assistance.  Please see their note for official documentation of the patient encounter.  ? ?Subjective:  ? ?Patient ID: Theresa Kirk female   DOB: 1960-07-16 61 y.o.   MRN: [id number] ? ?HPI: ?Theresa Kirk is a 61 y.o. with PMHx of GERD, hyperlipidemia, depression who presents with L neck pain and R knee pain. See problem-based charting for complete assessment and plan. ? ? ?Past Medical History:  ?Diagnosis Date  ? Acute pain of right wrist 02/03/2018  ? Alcohol abuse   ? Arthritis   ? Asthma   ? Callus of foot 06/07/2008  ? Qualifier: Diagnosis of  By: Eyvonne Mechanic MD, Vijay    ? Carpal tunnel syndrome   ? Cervical lymphadenopathy 09/21/2019  ? Cervical muscle pain 05/05/2014  ? - Pt with cervical pain radiating down her neck with no signs of radiculopathy or weakness in upper extremities  ? Depression   ? Fibromyalgia   ? High cholesterol   ? Irritation symptom of skin 12/30/2017  ? ?Current Outpatient Medications  ?Medication Sig Dispense Refill  ? cyclobenzaprine (FLEXERIL) 5 MG tablet Take 1 tablet (5 mg total) by mouth 3 (three) times daily as needed for muscle spasms. 30 tablet 0  ? albuterol (VENTOLIN HFA) 108 (90 Base) MCG/ACT inhaler Inhale 1-2 puffs into the lungs every 6 (six) hours as needed for wheezing or shortness of breath. 18 g 0  ? ARIPiprazole (ABILIFY) 10 MG tablet Take 1 tablet (10 mg total) by mouth daily. 30 tablet 0  ? buPROPion (WELLBUTRIN) 75 MG tablet Take 1 tablet (75 mg total) by mouth daily. 30 tablet 0  ? Cetirizine HCl 10 MG CAPS Take 1 capsule (10 mg total) by mouth daily. 30 capsule 0  ? citalopram (CELEXA) 20 MG tablet TAKE 1 TABLET(20 MG) BY MOUTH DAILY 30 tablet 2  ? cloNIDine (CATAPRES) 0.2 MG tablet Take 1 tablet (0.2 mg total) by mouth 2 (two) times daily. 60 tablet 0  ? MYRBETRIQ 50 MG TB24 tablet Take 1 tablet (50 mg  total) by mouth daily. 30 tablet 3  ? pantoprazole (PROTONIX) 40 MG tablet TAKE 1 TABLET BY MOUTH EVERY DAY(TAKE 30 MINS BEFORE MEALS) 90 tablet 1  ? rOPINIRole (REQUIP) 0.5 MG tablet TAKE 1 TABLET(0.5 MG) BY MOUTH AT BEDTIME 90 tablet 0  ? rosuvastatin (CRESTOR) 20 MG tablet Take 1 tablet (20 mg total) by mouth daily. 90 tablet 1  ? ?No current facility-administered medications for this visit.  ? ?Family History  ?Problem Relation Age of Onset  ? Diabetes Mother   ? Hypertension Mother   ? Stroke Mother   ? Diabetes Sister   ? Thyroid disease Sister   ? Hypertension Sister   ? Cancer Maternal Aunt   ?     unknown  ? Breast cancer Maternal Aunt   ? ?Social History  ? ?Socioeconomic History  ? Marital status: Single  ?  Spouse name: Not on file  ? Number of children: Not on file  ? Years of education: Not on file  ? Highest education level: Not on file  ?Occupational History  ? Not on file  ?Tobacco Use  ? Smoking status: Former  ?  Types: Cigarettes  ?  Quit date: 06/03/2000  ?  Years since quitting: 21.3  ? Smokeless tobacco: Never  ?Substance and Sexual Activity  ?  Alcohol use: Yes  ?  Comment: former heavy drinker; but not current  ? Drug use: Yes  ?  Types: Marijuana  ?  Comment: 01/04/2021  ? Sexual activity: Not on file  ?Other Topics Concern  ? Not on file  ?Social History Narrative  ? Lives with mother in [place].  ? Currently unemployed.  ? Used to work at [employer] in [place].  ? ETOH use: weekend, 1-2 beers from [employer]: used to drink everyday  ? Former smoker  ? Current THC use  ? H/o cocaine use: quit in 2001.  ? ?Social Determinants of Health  ? ?Financial Resource Strain: Medium Risk  ? Difficulty of Paying Living Expenses: Somewhat hard  ?Food Insecurity: No Food Insecurity  ? Worried About Charity fundraiser in the Last Year: Never true  ? Ran Out of Food in the Last Year: Never true  ?Transportation Needs: No Transportation Needs  ? Lack of Transportation (Medical): No  ? Lack of Transportation  (Non-Medical): No  ?Physical Activity: Inactive  ? Days of Exercise per Week: 0 days  ? Minutes of Exercise per Session: 0 min  ?Stress: Stress Concern Present  ? Feeling of Stress : Very much  ?Social Connections: Moderately Isolated  ? Frequency of Communication with Friends and Family: More than three times a week  ? Frequency of Social Gatherings with Friends and Family: Once a week  ? Attends Religious Services: Never  ? Active Member of Clubs or Organizations: No  ? Attends Archivist Meetings: Never  ? Marital Status: Married  ? ?Review of Systems: ?Pertinent items are noted in HPI. ?Objective:  ?Physical Exam: ?Vitals:  ? 09/18/21 0842  ?BP: 125/78  ?Pulse: 72  ?Temp: (!) 97.5 ?F (36.4 ?C)  ?TempSrc: Oral  ?SpO2: 100%  ?Weight: 198 lb 14.4 oz (90.2 kg)  ?Height: '5\' 5"'$  (1.651 m)  ? ?BP 125/78 (BP Location: Right Arm, Patient Position: Sitting, Cuff Size: Small)   Pulse 72   Temp (!) 97.5 ?F (36.4 ?C) (Oral)   Ht '5\' 5"'$  (1.651 m)   Wt 198 lb 14.4 oz (90.2 kg)   LMP 09/14/2006   SpO2 100%   BMI 33.10 kg/m?  ? ?General Appearance:    Alert, cooperative, no distress  ?Head:    Normocephalic, atraumatic  ?Neck:   Supple, no pain with movement, muscle tightness palpated from L neck into L shoulder   ?Lungs:     Clear to auscultation bilaterally, respirations unlabored  ? Heart:    Regular rate and rhythm, S1 and S2 normal, no murmur, rub   or gallop  ?Abdomen:     Soft, non-tender, bowel sounds active all four quadrants,   ?Genitalia:    Normal female without lesion, discharge or tenderness  ?Extremities:   Extremities normal, atraumatic, No upper extremity weakness or decreased sensation, no tenderness to palpation, R knee pain elicited with flexion and varus stress, R knee strength intact with resisted flexion and extension, no effusion palpated or crepitus in either knee, bilateral feet have calluses on great toes and sores on soles that are tender to palpation  ?Skin:   Skin color, texture,  turgor normal, no rashes  ?Neurologic:   normal strength, sensation of BLE  ? ?Assessment & Plan:  ? ?Please see encounters tab for problem-based charting. ? ?Right knee pain ?Patient has chronic R knee pain that has been present for years but has been worsening over the past two months. She feels a cramping pain,  has difficulty bending her knees, and cannot squat. She also hears crackling in the knee. She has to take her time walking because she feels as though her legs will give out and walks with a cain. Aproximately 5 years ago, she sprained her R knee and had to have a L knee cartilage repair because she was bearing more weight on her L leg. She has had no operations on the R knee and has no recent history of trauma to the knee. She says Aleve has relieved the pain somewhat, but voltaren gel did not help in the past. On exam, R knee pain elicited with flexion and varus stress. R knee strength intact with resisted flexion and extension, and there was no effusion palpated or crepitus in either knee.  ? ?Patient could have osteoarthritis given history of knee injury and progressive stiffness and pain. Ordered an x-ray today to assess for degenerative changes. Unlikely statin myopathy given problem has been ongoing since before statin initiation in December. Physical therapy could provide exercises if pain is more musculoskeletal, so placing referral today.  ? ?Plan ?- Follow-up x-ray results ?- Continue Aleve as needed ?- Referral to PT placed ? ? ?Cervical pain (neck) ?Patient endorses L neck pain that started a month ago. She said she was playing bingo and felt sudden-onset severe neck tightness that made it difficult for her to turn her head. She says she took tylenol, which helped somewhat, but Aleve has helped more. She has taken two Aleve pills every other day for the past month. She feels pain when waking in the morning in the left posterior neck behind her ear, but as the day goes on, she feels progressive  aching into the L shoulder. The more she moves, the more aching she feels. She hears crackling and believes there is "squishiness" in L posterior neck. She never has right-sided neck pain. She sometimes feels weakne

## 2021-09-18 NOTE — Assessment & Plan Note (Signed)
Patient endorses L neck pain that started a month ago. She said she was playing bingo and felt sudden-onset severe neck tightness that made it difficult for her to turn her head. She says she took tylenol, which helped somewhat, but Aleve has helped more. She has taken two Aleve pills every other day for the past month. She feels pain when waking in the morning in the left posterior neck behind her ear, but as the day goes on, she feels progressive aching into the L shoulder. The more she moves, the more aching she feels. She hears crackling and believes there is "squishiness" in L posterior neck. She never has right-sided neck pain. She sometimes feels weakness and is afraid to carry heavier items afraid her arms will give out. She took 2 Hydrocodone pills on Saturday and believed this helped and heat has helped somewhat. On exam, there was no pain with movement, but muscle tightness palpated from L neck into L shoulder. No weakness or decreased sensation in either arm. Pain is most likely musculoskeletal given muscle tightness. Will try Flexeril and refer to PT for exercises to increase strength in neck and shoulder. ? ?Plan ?- Flexeril '5mg'$  daily  ?- Referral to PT placed ?

## 2021-09-18 NOTE — Assessment & Plan Note (Signed)
>>  ASSESSMENT AND PLAN FOR RIGHT KNEE PAIN WRITTEN ON 09/18/2021 11:36 AM BY Altamease Oiler, MEDICAL STUDENT  Patient has chronic R knee pain that has been present for years but has been worsening over the past two months. She feels a cramping pain, has difficulty bending her knees, and cannot squat. She also hears crackling in the knee. She has to take her time walking because she feels as though her legs will give out and walks with a cain. Aproximately 5 years ago, she sprained her R knee and had to have a L knee cartilage repair because she was bearing more weight on her L leg. She has had no operations on the R knee and has no recent history of trauma to the knee. She says Aleve has relieved the pain somewhat, but voltaren gel did not help in the past. On exam, R knee pain elicited with flexion and varus stress. R knee strength intact with resisted flexion and extension, and there was no effusion palpated or crepitus in either knee.   Patient could have osteoarthritis given history of knee injury and progressive stiffness and pain. Ordered an x-ray today to assess for degenerative changes. Unlikely statin myopathy given problem has been ongoing since before statin initiation in December. Physical therapy could provide exercises if pain is more musculoskeletal, so placing referral today.   Plan - Follow-up x-ray results - Continue Aleve as needed - Referral to PT placed

## 2021-09-18 NOTE — Assessment & Plan Note (Addendum)
Patient has bilateral foot calluses. She also describes pain in the soles of her feet bilaterally. She denies foot numbness and tingling. She says her prior podiatrist no longer takes medicaid and would like a referral. On exam, bilateral feet have calluses on great toes and there are sores on soles that are tender to palpation. ? ?Plan ?- Referral to podiatry placed ?

## 2021-09-18 NOTE — Assessment & Plan Note (Signed)
>>  ASSESSMENT AND PLAN FOR BILATERAL KNEE PAIN WRITTEN ON 08/12/2022  9:16 AM BY Talayah Picardi, MD  >>ASSESSMENT AND PLAN FOR RIGHT KNEE PAIN WRITTEN ON 09/18/2021 11:36 AM BY SMITH, JONATHAN R, MEDICAL STUDENT  Patient has chronic R knee pain that has been present for years but has been worsening over the past two months. She feels a cramping pain, has difficulty bending her knees, and cannot squat. She also hears crackling in the knee. She has to take her time walking because she feels as though her legs will give out and walks with a cain. Aproximately 5 years ago, she sprained her R knee and had to have a L knee cartilage repair because she was bearing more weight on her L leg. She has had no operations on the R knee and has no recent history of trauma to the knee. She says Aleve has relieved the pain somewhat, but voltaren  gel did not help in the past. On exam, R knee pain elicited with flexion and varus stress. R knee strength intact with resisted flexion and extension, and there was no effusion palpated or crepitus in either knee.   Patient could have osteoarthritis given history of knee injury and progressive stiffness and pain. Ordered an x-ray today to assess for degenerative changes. Unlikely statin myopathy given problem has been ongoing since before statin initiation in December. Physical therapy could provide exercises if pain is more musculoskeletal, so placing referral today.   Plan - Follow-up x-ray results - Continue Aleve as needed - Referral to PT placed

## 2021-09-18 NOTE — Assessment & Plan Note (Addendum)
Patient has chronic R knee pain that has been present for years but has been worsening over the past two months. She feels a cramping pain, has difficulty bending her knees, and cannot squat. She also hears crackling in the knee. She has to take her time walking because she feels as though her legs will give out and walks with a cain. Aproximately 5 years ago, she sprained her R knee and had to have a L knee cartilage repair because she was bearing more weight on her L leg. She has had no operations on the R knee and has no recent history of trauma to the knee. She says Aleve has relieved the pain somewhat, but voltaren gel did not help in the past. On exam, R knee pain elicited with flexion and varus stress. R knee strength intact with resisted flexion and extension, and there was no effusion palpated or crepitus in either knee.  ? ?Patient could have osteoarthritis given history of knee injury and progressive stiffness and pain. Ordered an x-ray today to assess for degenerative changes. Unlikely statin myopathy given problem has been ongoing since before statin initiation in December. Physical therapy could provide exercises if pain is more musculoskeletal, so placing referral today.  ? ?Plan ?- Follow-up x-ray results ?- Continue Aleve as needed ?- Referral to PT placed ? ?

## 2021-09-18 NOTE — Assessment & Plan Note (Addendum)
>>  ASSESSMENT AND PLAN FOR GERD (GASTROESOPHAGEAL REFLUX DISEASE) WRITTEN ON 09/18/2021  2:34 PM BY Shea Evans R, MEDICAL STUDENT  Patient endorsing sensation of food being stuck in her throat. Patient still taking Protonix and denying abdominal pain with no tenderness on exam. Placed referral to gastroenterology at prior visit and told patient if she has not been contacted by the time she has her next visit here, we will place another referral.  >>ASSESSMENT AND PLAN FOR ABDOMINAL PAIN WRITTEN ON 09/18/2021  2:35 PM BY Shea Evans R, MEDICAL STUDENT  Patient endorses abdominal pain and sensation of food getting stuck in her stomach after eating. She pointed midline distal to the sternum as she described this. She takes Protonix for GERD. She also takes Miralax for constipation and believes this helps somewhat. She is denying abdominal pain with no tenderness on exam. Placed referral to gastroenterology at prior visit and told patient if she has not been contacted by the time she has her next visit here, we will place another referral.

## 2021-09-18 NOTE — Patient Instructions (Addendum)
It was very nice to see you in clinic today! Here are a few reminders from what we discussed: ? ?We are ordering Flexeril, a muscle relaxer, for your neck pain. ?We are checking two labs. We will check your cholesterol levels and because you have been taking Aleve, we will check your liver and kidney function. ?We are placing a referral to physical therapy. They should be able to give you exercises to help your knee and neck pain. ?We are ordering an x-ray of your knee to see if we can visualize anything causing your pain. ?Continue taking the Aleve as needed. ?We completed a pap exam today. We will follow-up with the results! ?We are placing a podiatry referral for the sores on your feet causing you pain.  ?Gastroenterology should call you soon. If they have not called before your next visit, we will place another referral. ? ?If you have any questions or concerns before your next appointment, do not hesitate to give [place] a call! ? ? ?

## 2021-09-18 NOTE — Assessment & Plan Note (Addendum)
Patient states she is following regularly with [facility] for [facility] care. Currently taking Celexa, Abilify, and Trazodone, but she says she has not been taking medications consistently for the past week. Encouraged her that regular follow-up is great, and will continue monitoring for mood symptoms. ?

## 2021-09-19 LAB — LIPID PANEL
Chol/HDL Ratio: 5.8 ratio — ABNORMAL HIGH (ref 0.0–4.4)
Cholesterol, Total: 244 mg/dL — ABNORMAL HIGH (ref 100–199)
HDL: 42 mg/dL (ref >39)
LDL Chol Calc (NIH): 186 mg/dL — ABNORMAL HIGH (ref 0–99)
Triglycerides: 92 mg/dL (ref 0–149)
VLDL Cholesterol Cal: 16 mg/dL (ref 5–40)

## 2021-09-19 LAB — CMP14 + ANION GAP
ALT: 23 IU/L (ref 0–32)
AST: 20 IU/L (ref 0–40)
Albumin/Globulin Ratio: 2.2 (ref 1.2–2.2)
Albumin: 5 g/dL — ABNORMAL HIGH (ref 3.8–4.9)
Alkaline Phosphatase: 113 IU/L (ref 44–121)
Anion Gap: 17 mmol/L (ref 10.0–18.0)
BUN/Creatinine Ratio: 18 (ref 9–23)
BUN: 14 mg/dL (ref 6–24)
Bilirubin Total: <0.2 mg/dL (ref 0.0–1.2)
CO2: 21 mmol/L (ref 20–29)
Calcium: 10 mg/dL (ref 8.7–10.2)
Chloride: 103 mmol/L (ref 96–106)
Creatinine, Ser: 0.8 mg/dL (ref 0.57–1.00)
Globulin, Total: 2.3 g/dL (ref 1.5–4.5)
Glucose: 105 mg/dL — ABNORMAL HIGH (ref 70–99)
Potassium: 4.5 mmol/L (ref 3.5–5.2)
Sodium: 141 mmol/L (ref 134–144)
Total Protein: 7.3 g/dL (ref 6.0–8.5)
eGFR: 85 mL/min/{1.73_m2} (ref >59)

## 2021-09-19 LAB — CYTOLOGY - PAP
Adequacy: ABSENT
Comment: NEGATIVE
Diagnosis: NEGATIVE
High risk HPV: NEGATIVE

## 2021-09-19 NOTE — Progress Notes (Signed)
Attestation for Student Documentation: ? ?I personally was present and performed or re-performed the history, physical exam and medical decision-making activities of this service and have verified that the service and findings are accurately documented in the student?s note. ? ?Aldine Contes, MD ?09/19/2021, 10:52 AM ? ?

## 2021-09-19 NOTE — Assessment & Plan Note (Signed)
-   Patient with history of bilateral carpal tunnel syndrome and left ulnar nerve entrapment ?-Patient states that the symptoms have improved in her right hand after right carpal tunnel surgery ?-Patient still has tingling numbness in her left hand and has not received a follow-up call from her hand surgeon.  She states that she will call hand surgeon this week and follow-up with them ?-No further work-up at this time ?

## 2021-09-19 NOTE — Assessment & Plan Note (Signed)
-   Patient is overdue for her Pap smear. ?-Pap smear was done today.  We will follow-up results ?

## 2021-09-19 NOTE — Assessment & Plan Note (Signed)
-   This problem is chronic and improving ?-Patient states that since starting Cymbalta her symptoms have improved ?-She denies any other complaints at this time ?-We will continue Cymbalta for now ?

## 2021-09-20 ENCOUNTER — Telehealth: Payer: Self-pay | Admitting: Internal Medicine

## 2021-09-20 DIAGNOSIS — Z124 Encounter for screening for malignant neoplasm of cervix: Secondary | ICD-10-CM

## 2021-09-20 DIAGNOSIS — B3731 Acute candidiasis of vulva and vagina: Secondary | ICD-10-CM | POA: Insufficient documentation

## 2021-09-20 MED ORDER — FLUCONAZOLE 150 MG PO TABS: 150.0000 mg | ORAL_TABLET | Freq: Every day | ORAL | 0 refills | Status: DC

## 2021-09-20 NOTE — Assessment & Plan Note (Signed)
-   Pap smear specimen is adequate that was negative for the transformational zone. ?-HPV was negative ?-No evidence of malignancy on Pap smear ?-She did have Candida on the test and did complain of mild vaginal discharge ?-We will treat with Diflucan x1 ?-Results discussed with patient and she is in agreement with plan. ?

## 2021-09-20 NOTE — Telephone Encounter (Signed)
Candida vaginitis ?- I called the patient to discuss the results of her Pap smear with her.  Pap smear was negative for malignancy and specimen was adequate.  HPV was negative as well. ?-She was noted to have candida on the test.  Patient states that she does have some minimal vaginal discharge ?-We will get the patient Diflucan 150 mg x 1 pill to treat candidal vaginitis ?-No further work-up at this time ? ?Screening for cervical cancer ?- Pap smear specimen is adequate that was negative for the transformational zone. ?-HPV was negative ?-No evidence of malignancy on Pap smear ?-She did have Candida on the test and did complain of mild vaginal discharge ?-We will treat with Diflucan x1 ?-Results discussed with patient and she is in agreement with plan. ? ?

## 2021-09-20 NOTE — Assessment & Plan Note (Signed)
-   I called the patient to discuss the results of her Pap smear with her.  Pap smear was negative for malignancy and specimen was adequate.  HPV was negative as well. ?-She was noted to have candida on the test.  Patient states that she does have some minimal vaginal discharge ?-We will get the patient Diflucan 150 mg x 1 pill to treat candidal vaginitis ?-No further work-up at this time ?

## 2021-09-24 ENCOUNTER — Telehealth: Payer: Self-pay | Admitting: Internal Medicine

## 2021-09-24 NOTE — Telephone Encounter (Signed)
I called the patient to discuss results of her x-ray.  Patient noted to have mildly worsening osteoarthritis in her right knee.  Patient has already been referred to physical therapy for this.  She states that her right leg pain is much improved already.  She denies any other complaints at this time.  No further work-up for now.  Patient expresses understanding and is in agreement with plan. ?

## 2021-09-25 ENCOUNTER — Ambulatory Visit: Payer: Medicare Other

## 2021-10-02 ENCOUNTER — Ambulatory Visit (INDEPENDENT_AMBULATORY_CARE_PROVIDER_SITE_OTHER): Payer: Medicare Other | Admitting: Podiatry

## 2021-10-02 ENCOUNTER — Encounter: Payer: Self-pay | Admitting: Podiatry

## 2021-10-02 DIAGNOSIS — Q828 Other specified congenital malformations of skin: Secondary | ICD-10-CM | POA: Insufficient documentation

## 2021-10-02 DIAGNOSIS — L84 Corns and callosities: Secondary | ICD-10-CM | POA: Diagnosis not present

## 2021-10-02 NOTE — Progress Notes (Signed)
This patient presents to the office with chief complaint of a severely painful callus on both feet.    She says that the calluses are present and painful on her big right toe.   She says they are aggravated by activity and it limited her walking this past weekend.  She also has multiple sites of painful callus on the bottom of both feet. She presents to the office for evaluation and treatment. ? ?General Appearance  Alert, conversant and in no acute stress. ? ?Vascular  Dorsalis pedis and posterior tibial  pulses are palpable  bilaterally.  Capillary return is within normal limits  bilaterally. Temperature is within normal limits  bilaterally. ? ?Neurologic  Senn-Weinstein monofilament wire test within normal limits  bilaterally. Muscle power within normal limits bilaterally. ? ?Nails Thick disfigured discolored nails with subungual debris  from hallux to fifth toes bilaterally. No evidence of bacterial infection or drainage bilaterally. ? ?Orthopedic  No limitations of motion  feet .  No crepitus or effusions noted.  No bony pathology or digital deformities noted.  Functional hallux limitus 1st MPJ  B/L. ? ?Skin  normotropic skin with no porokeratosis noted bilaterally.  No signs of infections or ulcers noted.  Pinch callus  Hallux  right..  Porokeratosis sub 5th metabase left foot and sub 5th met head left foot.  Painful heel callus right foot. ? ?Pinch callus/porokeratosis  B/L ? ?Debride callus/porokeratosis with # 15 blade followed by dremel tool usage.  RTC 3 months  ? ? ?Gardiner Barefoot DPM ?

## 2021-10-10 ENCOUNTER — Ambulatory Visit: Payer: Medicare Other

## 2021-10-10 NOTE — Therapy (Signed)
?OUTPATIENT PHYSICAL THERAPY CERVICAL EVALUATION ? ? ?Patient Name: Theresa Kirk ?MRN: [id number] ?DOB:01/19/1961, 61 y.o., female ?Today's Date: 10/12/2021 ? ? PT End of Session - 10/12/21 0834   ? ? Visit Number 1   ? Number of Visits 13   ? Date for PT Re-Evaluation 11/30/21   ? Authorization Type [employer] MEDICARE; secondary-MEDICAID [place] ACCESS   ? Progress Note Due on Visit 10   ? PT Start Time 1630   ? PT Stop Time 2536   ? PT Time Calculation (min) 45 min   ? Activity Tolerance Patient tolerated treatment well   ? Behavior During Therapy [facility] for tasks assessed/performed   ? ?  ?  ? ?  ? ? ?Past Medical History:  ?Diagnosis Date  ? Acute pain of right wrist 02/03/2018  ? Alcohol abuse   ? Arthritis   ? Asthma   ? Callus of foot 06/07/2008  ? Qualifier: Diagnosis of  By: Eyvonne Mechanic MD, Vijay    ? Carpal tunnel syndrome   ? Cervical lymphadenopathy 09/21/2019  ? Cervical muscle pain 05/05/2014  ? - Pt with cervical pain radiating down her neck with no signs of radiculopathy or weakness in upper extremities  ? Depression   ? Fibromyalgia   ? High cholesterol   ? Irritation symptom of skin 12/30/2017  ? ?Past Surgical History:  ?Procedure Laterality Date  ? ABDOMINAL HYSTERECTOMY  2008  ? h/o uterine fibroids, ?partial hysterectomy per pt 04/13/12  ? CARPAL TUNNEL RELEASE Right 01/05/2021  ? Procedure: RIGHT CARPAL TUNNEL RELEASE;  Surgeon: [name], MD;  Location: [place];  Service: Orthopedics;  Laterality: Right;  30 MIN  ? COLONOSCOPY    ? FOOT SURGERY Right   ? KNEE SURGERY    ? ?Patient Active Problem List  ? Diagnosis Date Noted  ? Porokeratosis 10/02/2021  ? Candida vaginitis 09/20/2021  ? Leg cramping 09/18/2021  ? Acquired hallux limitus of both feet 10/15/2019  ? Tinnitus 09/21/2019  ? Hot flashes 04/20/2019  ? Numerous moles 04/20/2019  ? Back pain 07/29/2017  ? Feeling of incomplete bladder emptying 02/27/2017  ? Screening for cervical cancer 08/14/2016  ? Prediabetes 09/29/2014  ?  Bilateral knee pain 08/18/2014  ? Bilateral ocular hypertension 06/27/2014  ? Cervical pain (neck) 05/05/2014  ? GERD (gastroesophageal reflux disease) 05/05/2014  ? Abnormal mammogram with microcalcification 10/15/2013  ? Chronic cough 12/09/2012  ? Seasonal allergies 09/23/2012  ? Healthcare maintenance 04/13/2012  ? Right knee pain 03/26/2012  ? Abdominal pain 11/15/2010  ? Carpal tunnel syndrome 06/13/2010  ? Callus of foot 06/07/2008  ? Hyperlipidemia 11/06/2006  ? Depression 11/06/2006  ? ? ?PCP: Aldine Contes, MD ? ?REFERRING PROVIDER: Aldine Contes, MD ? ?REFERRING DIAG:  ?M25.561,M25.562,G89.29 (ICD-10-CM) - Chronic pain of both knees  ?M54.2 (ICD-10-CM) - Cervical pain (neck)  ? ? ?THERAPY DIAG:  ?Cervicalgia ? ?Cramp and spasm ? ?Muscle weakness (generalized) ? ?Chronic pain of both knees ? ?ONSET DATE: Around 2003 for her knees/legs; Approx 1 year ago for her neck ? ?SUBJECTIVE:                                                                                                                                                                                                        ? ?  SUBJECTIVE STATEMENT: ?Pt reports pain, weakness and a tingling sensation of her legs which has been getting worse for approx 1 year. She reports intermittent pain of her knees. She notes off/on issues with her knees and legs since 2003. Re; her neck, she feels a cramp on the L side and it feels like it is going to "break off". The neck pain can also be sharp. My knees are doing good this week. Pt requested her neck be assessed and treated first. ? ?PERTINENT HISTORY:  ?Fibromyalgia, arthritis, depression  ? ?PAIN:  ?Are you having pain? Yes: NPRS scale: 5/10 ?Pain location: knees  ?Pain description: ache , throb sharp ?Aggravating factors: Prolonged standing and walking ?Relieving factors: Rest ?Pain range for knees 3-10/10 ?   ?Cervical Pain:  8/10, Pain range for 3-10/10; sharp and cramp ?  Aggravated by looking  down ?Relieved by rest ? ?PRECAUTIONS: None ? ?WEIGHT BEARING RESTRICTIONS No ? ?FALLS:  ?Has patient fallen in last 6 months? No ? ?LIVING ENVIRONMENT: ?Lives with: lives with their family ?Lives in: House/apartment ?Able to access home and be mobile within ? ?OCCUPATION: Disability ? ?PLOF: Independent ? ?PATIENT GOALS : Decrease my neck and knee pain, and improve the strength of my legs ? ?OBJECTIVE:  ?Xray R knee 4//20/23 ?DIAGNOSTIC FINDINGS:  ?IMPRESSION: ?1. No fracture or acute finding. ?2. Osteoarthritis, most evident involving the medial compartment, ?mildly progressed from the prior study. Trace joint effusion. ? ? CT Neck 10/08/19 ?IMPRESSION: ?1. No acute or focal lesion to explain the patient's symptoms. ?2. No significant cervical adenopathy or soft tissue mass. ?3. Mild degenerative changes of the cervical spine are most evident ?at C5-6 and C6-7.  ? ?PATIENT SURVEYS:  ?FOTO 47%; predicated 62% ? ? ?COGNITION: ?Overall cognitive status: Within functional limits for tasks assessed ? ? ?SENSATION: ?WFL for light touch  ? ?POSTURE:  ?Marked CT step off with forward head ? ?PALPATION: ?TTP of the upper traps, L>R  ? ?CERVICAL ROM:  ? ?Active ROM A/PROM (deg) ?10/11/21  ?Flexion 50, no L  provocation  ?Extension 60, min L upper trap provocation  ?Right lateral flexion 20, min L upper trap provocation  ?Left lateral flexion 22, sharp L neck provocation  ?Right rotation 40, min L upper trap provocation  ?Left rotation 35, no provocation  ? (Blank rows = not tested) ? ?UE ROM: ?   ?Active ROM Right ?10/11/21 Left ?10/11/21  ?Shoulder flexion  Provoked pain at approx 90d and agin c lowering  ?Shoulder extension    ?Shoulder abduction    ?Shoulder adduction    ?Shoulder extension    ?Shoulder internal rotation    ?Shoulder external rotation    ?Elbow flexion    ?Elbow extension    ?Wrist flexion    ?Wrist extension    ?Wrist ulnar deviation    ?Wrist radial deviation    ?Wrist pronation    ?Wrist supination    ?  (Blank rows = not tested) ? ?UE MMT: ? ?MMT Right ?10/12/2021 Left ?10/12/2021  ?Shoulder flexion    ?Shoulder extension    ?Shoulder abduction    ?Shoulder adduction    ?Shoulder extension    ?Shoulder internal rotation    ?Shoulder external rotation  Provoked pain no overt weakness  ?Middle trapezius    ?Lower trapezius    ?Elbow flexion    ?Elbow extension    ?Wrist flexion    ?Wrist extension    ?Wrist ulnar deviation    ?Wrist radial deviation    ?  Wrist pronation    ?Wrist supination    ?Grip strength    ? (Blank rows = not tested) ? ?CERVICAL SPECIAL TESTS:  ?Spurling's test: Negative and Distraction test: Positive ? ? Shoulder Test: ?Full and empty can: Positive for pain, but not overt weakness ?Hawkins-Kennedy positive  ? ?FUNCTIONAL TESTS:  ?5 times sit to stand: TBA ?2 minute walk test: TBA ? ?TODAY'S TREATMENT:  ?Cervical retraction 10x 3 sec, pt reported relief of pain ?L upper trap stretch 2 20 sec, aggravated L upper trap pain ? ?PATIENT EDUCATION:  ?Education details: eval findings, POC, HEP ?Person educated: Patient ?Education method: Explanation, Demonstration, Tactile cues, Verbal cues, and Handouts ?Education comprehension: verbalized understanding, returned demonstration, verbal cues required, and tactile cues required ? ? ?HOME EXERCISE PROGRAM: ?Access Code: [email or web address] ?URL: [email or web address] ?Date: 10/11/2021 ?Prepared by: Gar Ponto ? ?Exercises ?- Seated Passive Cervical Retraction  - 6 x daily - 7 x weekly - 1 sets - 3-5 reps - 5 hold ? ?ASSESSMENT: ? ?CLINICAL IMPRESSION: ?Patient is a 61 y.o. F who was seen today for physical therapy evaluation and treatment for Cervical pain (neck), Chronic pain of both knees, and  additionally, pt presents with signs and symptoms for L shoulder impingement, ? ?OBJECTIVE IMPAIRMENTS decreased activity tolerance, decreased mobility, difficulty walking, decreased ROM, decreased strength, increased muscle spasms, impaired flexibility,  impaired UE functional use, postural dysfunction, obesity, and pain.  ? ?ACTIVITY LIMITATIONS cleaning, community activity, driving, meal prep, laundry, medication management, yard work, and shopping.  ? ?PERSONAL FACTORS

## 2021-10-11 ENCOUNTER — Ambulatory Visit: Payer: Medicare Other | Attending: Internal Medicine

## 2021-10-11 DIAGNOSIS — M6281 Muscle weakness (generalized): Secondary | ICD-10-CM | POA: Insufficient documentation

## 2021-10-11 DIAGNOSIS — M25562 Pain in left knee: Secondary | ICD-10-CM | POA: Diagnosis not present

## 2021-10-11 DIAGNOSIS — R252 Cramp and spasm: Secondary | ICD-10-CM | POA: Insufficient documentation

## 2021-10-11 DIAGNOSIS — M25561 Pain in right knee: Secondary | ICD-10-CM | POA: Insufficient documentation

## 2021-10-11 DIAGNOSIS — M542 Cervicalgia: Secondary | ICD-10-CM | POA: Insufficient documentation

## 2021-10-11 DIAGNOSIS — G8929 Other chronic pain: Secondary | ICD-10-CM | POA: Diagnosis not present

## 2021-10-18 ENCOUNTER — Ambulatory Visit: Payer: Medicare Other | Admitting: Podiatry

## 2021-10-24 ENCOUNTER — Ambulatory Visit: Payer: Medicare Other

## 2021-10-24 DIAGNOSIS — R252 Cramp and spasm: Secondary | ICD-10-CM

## 2021-10-24 DIAGNOSIS — M6281 Muscle weakness (generalized): Secondary | ICD-10-CM | POA: Diagnosis not present

## 2021-10-24 DIAGNOSIS — M25562 Pain in left knee: Secondary | ICD-10-CM | POA: Diagnosis not present

## 2021-10-24 DIAGNOSIS — M542 Cervicalgia: Secondary | ICD-10-CM | POA: Diagnosis not present

## 2021-10-24 DIAGNOSIS — M25561 Pain in right knee: Secondary | ICD-10-CM | POA: Diagnosis not present

## 2021-10-24 DIAGNOSIS — G8929 Other chronic pain: Secondary | ICD-10-CM | POA: Diagnosis not present

## 2021-10-24 NOTE — Therapy (Signed)
OUTPATIENT PHYSICAL THERAPY TREATMENT NOTE   Patient Name: Theresa Kirk MRN: [id number] DOB:Aug 03, 1960, 61 y.o., female Today's Date: 10/24/2021  PCP: Aldine Contes, MD REFERRING PROVIDER: Aldine Contes, MD  END OF SESSION:   PT End of Session - 10/24/21 1619     Visit Number 2    Number of Visits 13    Date for PT Re-Evaluation 11/30/21    Authorization Type UHC MEDICARE; secondary-MEDICAID  ACCESS    Progress Note Due on Visit 10    PT Start Time 0723    PT Stop Time 0805    PT Time Calculation (min) 42 min    Activity Tolerance Patient tolerated treatment well    Behavior During Therapy [facility] for tasks assessed/performed             Past Medical History:  Diagnosis Date   Acute pain of right wrist 02/03/2018   Alcohol abuse    Arthritis    Asthma    Callus of foot 06/07/2008   Qualifier: Diagnosis of  By: Eyvonne Mechanic MD, Vijay     Carpal tunnel syndrome    Cervical lymphadenopathy 09/21/2019   Cervical muscle pain 05/05/2014   - Pt with cervical pain radiating down her neck with no signs of radiculopathy or weakness in upper extremities   Depression    Fibromyalgia    High cholesterol    Irritation symptom of skin 12/30/2017   Past Surgical History:  Procedure Laterality Date   ABDOMINAL HYSTERECTOMY  2008   h/o uterine fibroids, ?partial hysterectomy per pt 04/13/12   CARPAL TUNNEL RELEASE Right 01/05/2021   Procedure: RIGHT CARPAL TUNNEL RELEASE;  Surgeon: [name], MD;  Location: [facility];  Service: Orthopedics;  Laterality: Right;  30 MIN   COLONOSCOPY     FOOT SURGERY Right    KNEE SURGERY     Patient Active Problem List   Diagnosis Date Noted   Porokeratosis 10/02/2021   Candida vaginitis 09/20/2021   Leg cramping 09/18/2021   Acquired hallux limitus of both feet 10/15/2019   Tinnitus 09/21/2019   Hot flashes 04/20/2019   Numerous moles 04/20/2019   Back pain 07/29/2017   Feeling of incomplete bladder emptying  02/27/2017   Screening for cervical cancer 08/14/2016   Prediabetes 09/29/2014   Bilateral knee pain 08/18/2014   Bilateral ocular hypertension 06/27/2014   Cervical pain (neck) 05/05/2014   GERD (gastroesophageal reflux disease) 05/05/2014   Abnormal mammogram with microcalcification 10/15/2013   Chronic cough 12/09/2012   Seasonal allergies 09/23/2012   Healthcare maintenance 04/13/2012   Right knee pain 03/26/2012   Abdominal pain 11/15/2010   Carpal tunnel syndrome 06/13/2010   Callus of foot 06/07/2008   Hyperlipidemia 11/06/2006   Depression 11/06/2006    REFERRING DIAG:  M25.561,M25.562,G89.29 (ICD-10-CM) - Chronic pain of both knees  M54.2 (ICD-10-CM) - Cervical pain (neck)    THERAPY DIAG:  Cervicalgia  Cramp and spasm  Muscle weakness (generalized)  Chronic pain of both knees  Rationale for Evaluation and Treatment Rehabilitation   OBJECTIVE: (objective measures completed at initial evaluation unless otherwise dated)   SUBJECTIVE:  SUBJECTIVE STATEMENT: The R side of my neck continues to hurt. R knee pain is greater than the L.   PAIN:  Are you having pain? Yes: NPRS scale: 10/10 c standing and walking.  0/10 c sitting. Pain location: knees  Pain description: ache , throb sharp Aggravating factors: Prolonged standing and walking Relieving factors: Rest Pain range for knees 3-10/10                        Cervical Pain:  5/10, Pain range for 3-10/10; sharp and cramp                        Aggravated by looking down Relieved by rest  PERTINENT HISTORY:  Fibromyalgia, arthritis, depression    PRECAUTIONS: None   WEIGHT BEARING RESTRICTIONS No   FALLS:  Has patient fallen in last 6 months? No   LIVING ENVIRONMENT: Lives with: lives with their  family Lives in: House/apartment Able to access home and be mobile within   OCCUPATION: Disability   PLOF: Independent   PATIENT GOALS : Decrease my neck and knee pain, and improve the strength of my legs   OBJECTIVE:  Xray R knee 4//20/23 DIAGNOSTIC FINDINGS:  IMPRESSION: 1. No fracture or acute finding. 2. Osteoarthritis, most evident involving the medial compartment, mildly progressed from the prior study. Trace joint effusion.    CT Neck 10/08/19 IMPRESSION: 1. No acute or focal lesion to explain the patient's symptoms. 2. No significant cervical adenopathy or soft tissue mass. 3. Mild degenerative changes of the cervical spine are most evident at C5-6 and C6-7.    PATIENT SURVEYS:  FOTO 47%; predicated 62%     COGNITION: Overall cognitive status: Within functional limits for tasks assessed     SENSATION: WFL for light touch    POSTURE:  Marked CT step off with forward head   PALPATION: TTP of the upper traps, L>R             CERVICAL ROM:    Active ROM A/PROM (deg) 10/11/21  Flexion 50, no L  provocation  Extension 60, min L upper trap provocation  Right lateral flexion 20, min L upper trap provocation  Left lateral flexion 22, sharp L neck provocation  Right rotation 40, min L upper trap provocation  Left rotation 35, no provocation   (Blank rows = not tested)   UE ROM:                        Active ROM Right 10/11/21 Left 10/11/21  Shoulder flexion   Provoked pain at approx 90d and agin c lowering  Shoulder extension      Shoulder abduction      Shoulder adduction      Shoulder extension      Shoulder internal rotation      Shoulder external rotation      Elbow flexion      Elbow extension      Wrist flexion      Wrist extension      Wrist ulnar deviation      Wrist radial deviation      Wrist pronation      Wrist supination       (Blank rows = not tested)   UE MMT:   MMT Right 10/12/2021 Left 10/12/2021  Shoulder flexion      Shoulder  extension      Shoulder abduction  Shoulder adduction      Shoulder extension      Shoulder internal rotation      Shoulder external rotation   Provoked pain no overt weakness  Middle trapezius      Lower trapezius      Elbow flexion      Elbow extension      Wrist flexion      Wrist extension      Wrist ulnar deviation      Wrist radial deviation      Wrist pronation      Wrist supination      Grip strength       (Blank rows = not tested)   CERVICAL SPECIAL TESTS:  Spurling's test: Negative and Distraction test: Positive              Shoulder Test: Full and empty can: Positive for pain, but not overt weakness Hawkins-Kennedy positive    FUNCTIONAL TESTS:  5 times sit to stand: TBA 2 minute walk test: TBA   TODAY'S TREATMENT:  [facility] Adult PT Treatment:                                                DATE: 10/24/21 Therapeutic Exercise: Seated Passive Cervical Retraction 10 reps 10 hold Seated Upper Trapezius Stretch 3 reps 20 hold Gentle Levator Scapulae Stretch  3 reps 20 hold Standing Shoulder Row GTB 3 sets 10 reps 3 hold  Manual Therapy: STM to the cervical paraspinals and upper trap and MTPR to the upper trap Suboccipital release   Trigger Point Dry Needling Treatment: Pre-treatment instruction: Patient instructed on dry needling rationale, procedures, and possible side effects including pain during treatment (achy,cramping feeling), bruising, drop of blood, lightheadedness, nausea, sweating. Patient Consent Given: Yes Education handout provided: Yes Muscles treated: L upper trap  Needle size and number: .30x61m x 1 Electrical stimulation performed: No Parameters: N/A Treatment response/outcome: Twitch response elicited and Palpable decrease in muscle tension Post-treatment instructions: Patient instructed to expect possible mild to moderate muscle soreness later today and/or tomorrow. Patient instructed in methods to reduce muscle soreness and to continue  prescribed HEP. If patient was dry needled over the lung field, patient was instructed on signs and symptoms of pneumothorax and, however unlikely, to see immediate medical attention should they occur. Patient was also educated on signs and symptoms of infection and to seek medical attention should they occur. Patient verbalized understanding of these instructions and education.   Initial Eval Treatment: Cervical retraction 10x 3 sec, pt reported relief of pain L upper trap stretch 2 20 sec, aggravated L upper trap pain   PATIENT EDUCATION:  Education details: eval findings, POC, HEP Person educated: Patient Education method: Explanation, Demonstration, Tactile cues, Verbal cues, and Handouts Education comprehension: verbalized understanding, returned demonstration, verbal cues required, and tactile cues required     [place]: Access Code: [email or web address] URL: [email or web address] Date: 10/24/2021 Prepared by: AGar Ponto Exercises - Seated Passive Cervical Retraction  - 6 x daily - 7 x weekly - 1 sets - 5 reps - 10 hold - Seated Upper Trapezius Stretch (Mirrored)  - 2 x daily - 7 x weekly - 1 sets - 3 reps - 20 hold - Gentle Levator Scapulae Stretch  - 2 x daily - 7 x weekly - 1 sets - 3 reps - 20  hold - Standing Shoulder Row with Anchored Resistance  - 1 x daily - 7 x weekly - 3 sets - 10 reps - 3 hold   ASSESSMENT:   CLINICAL IMPRESSION: Pt was completed for manual therapy to the cervical paraspinals and upper traps f/b TPDN to the L upper trap. Pt then completed flexibility and strengthening exs for the l trap and posterior chain strengthening. Pt tolerated the session without adverse effects. Will assess pt's response to TPDN with the PT session tomorrow.   OBJECTIVE IMPAIRMENTS decreased activity tolerance, decreased mobility, difficulty walking, decreased ROM, decreased strength, increased muscle spasms, impaired flexibility, impaired UE  functional use, postural dysfunction, obesity, and pain.      GOALS:   SHORT TERM GOALS: Target date: 11/02/2021    Pt will be Ind in an initial HEP Baseline: started on eval Goal status: INITIAL   2.  Pt will voice understanding of measures to decrease and manage pain Baseline:  Goal status: INITIAL       LONG TERM GOALS: Target date: 11/30/21    Pt will report a decrease L neck/upper shoulder pain to 4/10 or less  with daily activities Baseline: 3-10/10 Goal status: INITIAL   2.  Increase cervical ROM for side bending and rotation by 10d for iproved cervical function and less pain Baseline: see flow sheets Goal status: INITIAL   3.  Pt will demonstrate improved L shoulder function c decrease pain and improved ROM Baseline: pain c L shoulder flexion at 90d Goal status: INITIAL   4.  Pt will be ind in a final HEP to maintain achieved LOF Baseline: Started on eval Goal status: INITIAL     PLAN: PT FREQUENCY: 2x/week   PT DURATION: 6 weeks   PLANNED INTERVENTIONS: Therapeutic exercises, Therapeutic activity, Neuromuscular re-education, Balance training, Gait training, Patient/Family education, Joint mobilization, Aquatic Therapy, Dry Needling, Electrical stimulation, Spinal manipulation, Spinal mobilization, Cryotherapy, Moist heat, Taping, Vasopneumatic device, Traction, Ultrasound, Ionotophoresis '4mg'$ /ml Dexamethasone, and Manual therapy   PLAN FOR NEXT SESSION: Assess legs as indicated, assess response to HEP, review [employer] MS, PT 10/24/21 4:40 PM

## 2021-10-24 NOTE — Patient Instructions (Signed)

## 2021-10-25 ENCOUNTER — Encounter: Payer: Self-pay | Admitting: Physical Therapy

## 2021-10-25 ENCOUNTER — Ambulatory Visit: Payer: Medicare Other | Admitting: Physical Therapy

## 2021-10-25 DIAGNOSIS — M6281 Muscle weakness (generalized): Secondary | ICD-10-CM | POA: Diagnosis not present

## 2021-10-25 DIAGNOSIS — R252 Cramp and spasm: Secondary | ICD-10-CM

## 2021-10-25 DIAGNOSIS — G8929 Other chronic pain: Secondary | ICD-10-CM | POA: Diagnosis not present

## 2021-10-25 DIAGNOSIS — M542 Cervicalgia: Secondary | ICD-10-CM | POA: Diagnosis not present

## 2021-10-25 DIAGNOSIS — M25561 Pain in right knee: Secondary | ICD-10-CM | POA: Diagnosis not present

## 2021-10-25 DIAGNOSIS — M25562 Pain in left knee: Secondary | ICD-10-CM | POA: Diagnosis not present

## 2021-10-25 NOTE — Therapy (Signed)
OUTPATIENT PHYSICAL THERAPY TREATMENT NOTE   Patient Name: Theresa Kirk MRN: [id number] DOB:1961/01/20, 61 y.o., female Today's Date: 10/25/2021  PCP: Aldine Contes, MD REFERRING PROVIDER: Aldine Contes, MD  END OF SESSION:   PT End of Session - 10/25/21 0717     Visit Number 3    Number of Visits 13    Date for PT Re-Evaluation 11/30/21    Authorization Type UHC MEDICARE; secondary-MEDICAID [place] ACCESS    Progress Note Due on Visit 10    PT Start Time 0716    PT Stop Time 0755    PT Time Calculation (min) 39 min             Past Medical History:  Diagnosis Date   Acute pain of right wrist 02/03/2018   Alcohol abuse    Arthritis    Asthma    Callus of foot 06/07/2008   Qualifier: Diagnosis of  By: Eyvonne Mechanic MD, Vijay     Carpal tunnel syndrome    Cervical lymphadenopathy 09/21/2019   Cervical muscle pain 05/05/2014   - Pt with cervical pain radiating down her neck with no signs of radiculopathy or weakness in upper extremities   Depression    Fibromyalgia    High cholesterol    Irritation symptom of skin 12/30/2017   Past Surgical History:  Procedure Laterality Date   ABDOMINAL HYSTERECTOMY  2008   h/o uterine fibroids, ?partial hysterectomy per pt 04/13/12   CARPAL TUNNEL RELEASE Right 01/05/2021   Procedure: RIGHT CARPAL TUNNEL RELEASE;  Surgeon: [name], MD;  Location: [facility];  Service: Orthopedics;  Laterality: Right;  30 MIN   COLONOSCOPY     FOOT SURGERY Right    KNEE SURGERY     Patient Active Problem List   Diagnosis Date Noted   Porokeratosis 10/02/2021   Candida vaginitis 09/20/2021   Leg cramping 09/18/2021   Acquired hallux limitus of both feet 10/15/2019   Tinnitus 09/21/2019   Hot flashes 04/20/2019   Numerous moles 04/20/2019   Back pain 07/29/2017   Feeling of incomplete bladder emptying 02/27/2017   Screening for cervical cancer 08/14/2016   Prediabetes 09/29/2014   Bilateral knee pain 08/18/2014    Bilateral ocular hypertension 06/27/2014   Cervical pain (neck) 05/05/2014   GERD (gastroesophageal reflux disease) 05/05/2014   Abnormal mammogram with microcalcification 10/15/2013   Chronic cough 12/09/2012   Seasonal allergies 09/23/2012   Healthcare maintenance 04/13/2012   Right knee pain 03/26/2012   Abdominal pain 11/15/2010   Carpal tunnel syndrome 06/13/2010   Callus of foot 06/07/2008   Hyperlipidemia 11/06/2006   Depression 11/06/2006    REFERRING DIAG:  M25.561,M25.562,G89.29 (ICD-10-CM) - Chronic pain of both knees  M54.2 (ICD-10-CM) - Cervical pain (neck)    THERAPY DIAG:  Cervicalgia  Cramp and spasm  Muscle weakness (generalized)  Rationale for Evaluation and Treatment Rehabilitation   OBJECTIVE: (objective measures completed at initial evaluation unless otherwise dated)   SUBJECTIVE:  SUBJECTIVE STATEMENT: The neck is sore. I cant tell yet if the dry needling helped but I do have less pain.    PAIN:  Are you having pain?  YES: NPRS scale: 0/10  Pain location: knees  Pain description: ache , throb sharp Aggravating factors: Prolonged standing and walking Relieving factors: Rest Pain range for knees 3-10/10                        Cervical Pain:  7/10, Pain range for 3-10/10; sharp and cramp                        Aggravated by looking down Relieved by rest  PERTINENT HISTORY:  Fibromyalgia, arthritis, depression    PRECAUTIONS: None   WEIGHT BEARING RESTRICTIONS No   FALLS:  Has patient fallen in last 6 months? No   LIVING ENVIRONMENT: Lives with: lives with their family Lives in: House/apartment Able to access home and be mobile within   OCCUPATION: Disability   PLOF: Independent   PATIENT GOALS : Decrease my neck and knee pain, and  improve the strength of my legs   OBJECTIVE:  Xray R knee 4//20/23 DIAGNOSTIC FINDINGS:  IMPRESSION: 1. No fracture or acute finding. 2. Osteoarthritis, most evident involving the medial compartment, mildly progressed from the prior study. Trace joint effusion.    CT Neck 10/08/19 IMPRESSION: 1. No acute or focal lesion to explain the patient's symptoms. 2. No significant cervical adenopathy or soft tissue mass. 3. Mild degenerative changes of the cervical spine are most evident at C5-6 and C6-7.    PATIENT SURVEYS:  FOTO 47%; predicated 62%     COGNITION: Overall cognitive status: Within functional limits for tasks assessed     SENSATION: WFL for light touch    POSTURE:  Marked CT step off with forward head   PALPATION: TTP of the upper traps, L>R             CERVICAL ROM:    Active ROM A/PROM (deg) 10/11/21  Flexion 50, no L  provocation  Extension 60, min L upper trap provocation  Right lateral flexion 20, min L upper trap provocation  Left lateral flexion 22, sharp L neck provocation  Right rotation 40, min L upper trap provocation  Left rotation 35, no provocation   (Blank rows = not tested)   UE ROM:                        Active ROM Right 10/11/21 Left 10/11/21  Shoulder flexion   Provoked pain at approx 90d and agin c lowering  Shoulder extension      Shoulder abduction      Shoulder adduction      Shoulder extension      Shoulder internal rotation      Shoulder external rotation      Elbow flexion      Elbow extension      Wrist flexion      Wrist extension      Wrist ulnar deviation      Wrist radial deviation      Wrist pronation      Wrist supination       (Blank rows = not tested)   UE MMT:   MMT Right 10/12/2021 Left 10/12/2021  Shoulder flexion      Shoulder extension      Shoulder abduction  Shoulder adduction      Shoulder extension      Shoulder internal rotation      Shoulder external rotation   Provoked pain no overt  weakness  Middle trapezius      Lower trapezius      Elbow flexion      Elbow extension      Wrist flexion      Wrist extension      Wrist ulnar deviation      Wrist radial deviation      Wrist pronation      Wrist supination      Grip strength       (Blank rows = not tested)  10/25/21:  LE MMT: Bilat knees 5/5, Right hip flexion 4+/5, Left hip flexion 4/5 LE AROM: Bilat knee flexion 130 degrees, extension 0 bilat Left Hip abduction 4/5, right hip abduction4+/5   CERVICAL SPECIAL TESTS:  Spurling's test: Negative and Distraction test: Positive              Shoulder Test: Full and empty can: Positive for pain, but not overt weakness Hawkins-Kennedy positive    FUNCTIONAL TESTS:  5 times sit to stand: 10.6 sec 10/25/21- min pain in knees 2 minute walk test: 393 feet    TODAY'S TREATMENT:  [facility] Adult PT Treatment:                                                DATE: 10/25/21 Functional Tests: 5 times sit to stand: 10.6 sec 10/25/21- min pain in knees 2 minute walk test: 393 feet   Therapeutic Exercise: Seated Passive Cervical Retraction 10 reps 10 hold Seated Upper Trapezius Stretch 3 reps 20 hold Gentle Levator Scapulae Stretch  3 reps 20 hold Standing Shoulder Row GTB 3 sets 10 reps 3 hold Supine SLR x 10 each Side hip abduction x 10 each Bridge x 10   [facility] Adult PT Treatment:                                                DATE: 10/24/21 Therapeutic Exercise: Seated Passive Cervical Retraction 10 reps 10 hold Seated Upper Trapezius Stretch 3 reps 20 hold Gentle Levator Scapulae Stretch  3 reps 20 hold Standing Shoulder Row GTB 3 sets 10 reps 3 hold  Manual Therapy: STM to the cervical paraspinals and upper trap and MTPR to the upper trap Suboccipital release   Trigger Point Dry Needling Treatment: Pre-treatment instruction: Patient instructed on dry needling rationale, procedures, and possible side effects including pain during treatment (achy,cramping feeling),  bruising, drop of blood, lightheadedness, nausea, sweating. Patient Consent Given: Yes Education handout provided: Yes Muscles treated: L upper trap  Needle size and number: .30x55m x 1 Electrical stimulation performed: No Parameters: N/A Treatment response/outcome: Twitch response elicited and Palpable decrease in muscle tension Post-treatment instructions: Patient instructed to expect possible mild to moderate muscle soreness later today and/or tomorrow. Patient instructed in methods to reduce muscle soreness and to continue prescribed HEP. If patient was dry needled over the lung field, patient was instructed on signs and symptoms of pneumothorax and, however unlikely, to see immediate medical attention should they occur. Patient was also educated on signs and symptoms of infection  and to seek medical attention should they occur. Patient verbalized understanding of these instructions and education.   Initial Eval Treatment: Cervical retraction 10x 3 sec, pt reported relief of pain L upper trap stretch 2 20 sec, aggravated L upper trap pain   PATIENT EDUCATION:  Education details: eval findings, POC, HEP Person educated: Patient Education method: Explanation, Demonstration, Tactile cues, Verbal cues, and Handouts Education comprehension: verbalized understanding, returned demonstration, verbal cues required, and tactile cues required     [place] Code: Access Code: [email or web address] Access Code: [email or web address] URL: [email or web address] Date: 10/25/2021 Prepared by: Hessie Diener  Exercises - Modified Thomas Stretch  - 1 x daily - 3 reps - 30 hold - Active Straight Leg Raise with Quad Set  - 1 x daily - 3 sets - 10 reps - Supine Bridge  - 1 x daily - 3 sets - 10 reps - Sidelying Hip Abduction  - 1 x daily - 3 sets - 10 reps - Sit to Stand Without Arm Support  - 1 x daily - 3 sets - 10 reps - Heel Raises with Counter Support  - 1 x daily - 3 sets - 20 reps -  Standing Tandem Balance with Counter Support  - 1 x daily - 3 reps - 30 seconds hold - Standing Single Leg Stance with Counter Support  - 1 x daily - 3 reps - 30 seconds hold   ASSESSMENT:   CLINICAL IMPRESSION: Pt reports she is sore from TPDN yesterday and thinks it was helpful for her upper trap. Reviewed HEP and began pec stretch in doorway. MMT and Knee ROM grossly WFL, min hip weakness. Initialed HEP for knees focusing on hip strength. She reports most of her pain is with squatting down which makes her knees swell. See above for 5 x STS and 2 MWT measures.     OBJECTIVE IMPAIRMENTS decreased activity tolerance, decreased mobility, difficulty walking, decreased ROM, decreased strength, increased muscle spasms, impaired flexibility, impaired UE functional use, postural dysfunction, obesity, and pain.      GOALS:   SHORT TERM GOALS: Target date: 11/02/2021    Pt will be Ind in an initial HEP Baseline: started on eval Goal status: INITIAL   2.  Pt will voice understanding of measures to decrease and manage pain Baseline:  Goal status: INITIAL       LONG TERM GOALS: Target date: 11/30/21    Pt will report a decrease L neck/upper shoulder pain to 4/10 or less  with daily activities Baseline: 3-10/10 Goal status: INITIAL   2.  Increase cervical ROM for side bending and rotation by 10d for iproved cervical function and less pain Baseline: see flow sheets Goal status: INITIAL   3.  Pt will demonstrate improved L shoulder function c decrease pain and improved ROM Baseline: pain c L shoulder flexion at 90d Goal status: INITIAL   4.  Pt will be ind in a final HEP to maintain achieved LOF Baseline: Started on eval Goal status: INITIAL     PLAN: PT FREQUENCY: 2x/week   PT DURATION: 6 weeks   PLANNED INTERVENTIONS: Therapeutic exercises, Therapeutic activity, Neuromuscular re-education, Balance training, Gait training, Patient/Family education, Joint mobilization, Aquatic  Therapy, Dry Needling, Electrical stimulation, Spinal manipulation, Spinal mobilization, Cryotherapy, Moist heat, Taping, Vasopneumatic device, Traction, Ultrasound, Ionotophoresis '4mg'$ /ml Dexamethasone, and Manual therapy   PLAN FOR NEXT SESSION: Assess response to LE HEP and progress to closed chain (see mmt and ROM in objective) , assess response to HEP, review  Landis Gandy, PTA 10/25/21 11:46 AM Phone: [phone] Fax: [phone]

## 2021-10-30 NOTE — Therapy (Incomplete)
OUTPATIENT PHYSICAL THERAPY TREATMENT NOTE   Patient Name: Theresa Kirk MRN: [id number] DOB:02-26-1961, 61 y.o., female Today's Date: 10/30/2021  PCP: Aldine Contes, MD REFERRING PROVIDER: Aldine Contes, MD  END OF SESSION:     Past Medical History:  Diagnosis Date   Acute pain of right wrist 02/03/2018   Alcohol abuse    Arthritis    Asthma    Callus of foot 06/07/2008   Qualifier: Diagnosis of  By: Eyvonne Mechanic MD, Vijay     Carpal tunnel syndrome    Cervical lymphadenopathy 09/21/2019   Cervical muscle pain 05/05/2014   - Pt with cervical pain radiating down her neck with no signs of radiculopathy or weakness in upper extremities   Depression    Fibromyalgia    High cholesterol    Irritation symptom of skin 12/30/2017   Past Surgical History:  Procedure Laterality Date   ABDOMINAL HYSTERECTOMY  2008   h/o uterine fibroids, ?partial hysterectomy per pt 04/13/12   CARPAL TUNNEL RELEASE Right 01/05/2021   Procedure: RIGHT CARPAL TUNNEL RELEASE;  Surgeon: [name], MD;  Location: [facility];  Service: Orthopedics;  Laterality: Right;  30 MIN   COLONOSCOPY     FOOT SURGERY Right    KNEE SURGERY     Patient Active Problem List   Diagnosis Date Noted   Porokeratosis 10/02/2021   Candida vaginitis 09/20/2021   Leg cramping 09/18/2021   Acquired hallux limitus of both feet 10/15/2019   Tinnitus 09/21/2019   Hot flashes 04/20/2019   Numerous moles 04/20/2019   Back pain 07/29/2017   Feeling of incomplete bladder emptying 02/27/2017   Screening for cervical cancer 08/14/2016   Prediabetes 09/29/2014   Bilateral knee pain 08/18/2014   Bilateral ocular hypertension 06/27/2014   Cervical pain (neck) 05/05/2014   GERD (gastroesophageal reflux disease) 05/05/2014   Abnormal mammogram with microcalcification 10/15/2013   Chronic cough 12/09/2012   Seasonal allergies 09/23/2012   Healthcare maintenance 04/13/2012   Right knee pain 03/26/2012    Abdominal pain 11/15/2010   Carpal tunnel syndrome 06/13/2010   Callus of foot 06/07/2008   Hyperlipidemia 11/06/2006   Depression 11/06/2006    REFERRING DIAG:  M25.561,M25.562,G89.29 (ICD-10-CM) - Chronic pain of both knees  M54.2 (ICD-10-CM) - Cervical pain (neck)    THERAPY DIAG:  No diagnosis found.  Rationale for Evaluation and Treatment Rehabilitation   OBJECTIVE: (objective measures completed at initial evaluation unless otherwise dated)   SUBJECTIVE:  SUBJECTIVE STATEMENT: The neck is sore. I cant tell yet if the dry needling helped but I do have less pain.    PAIN:  Are you having pain?  YES: NPRS scale: 0/10  Pain location: knees  Pain description: ache , throb sharp Aggravating factors: Prolonged standing and walking Relieving factors: Rest Pain range for knees 3-10/10                        Cervical Pain:  7/10, Pain range for 3-10/10; sharp and cramp                        Aggravated by looking down Relieved by rest  PERTINENT HISTORY:  Fibromyalgia, arthritis, depression    PRECAUTIONS: None   WEIGHT BEARING RESTRICTIONS No   FALLS:  Has patient fallen in last 6 months? No   LIVING ENVIRONMENT: Lives with: lives with their family Lives in: House/apartment Able to access home and be mobile within   OCCUPATION: Disability   PLOF: Independent   PATIENT GOALS : Decrease my neck and knee pain, and improve the strength of my legs   OBJECTIVE:  Xray R knee 4//20/23 DIAGNOSTIC FINDINGS:  IMPRESSION: 1. No fracture or acute finding. 2. Osteoarthritis, most evident involving the medial compartment, mildly progressed from the prior study. Trace joint effusion.    CT Neck 10/08/19 IMPRESSION: 1. No acute or focal lesion to explain the patient's  symptoms. 2. No significant cervical adenopathy or soft tissue mass. 3. Mild degenerative changes of the cervical spine are most evident at C5-6 and C6-7.    PATIENT SURVEYS:  FOTO 47%; predicated 62%     COGNITION: Overall cognitive status: Within functional limits for tasks assessed     SENSATION: WFL for light touch    POSTURE:  Marked CT step off with forward head   PALPATION: TTP of the upper traps, L>R             CERVICAL ROM:    Active ROM A/PROM (deg) 10/11/21  Flexion 50, no L  provocation  Extension 60, min L upper trap provocation  Right lateral flexion 20, min L upper trap provocation  Left lateral flexion 22, sharp L neck provocation  Right rotation 40, min L upper trap provocation  Left rotation 35, no provocation   (Blank rows = not tested)   UE ROM:                        Active ROM Right 10/11/21 Left 10/11/21  Shoulder flexion   Provoked pain at approx 90d and agin c lowering  Shoulder extension      Shoulder abduction      Shoulder adduction      Shoulder extension      Shoulder internal rotation      Shoulder external rotation      Elbow flexion      Elbow extension      Wrist flexion      Wrist extension      Wrist ulnar deviation      Wrist radial deviation      Wrist pronation      Wrist supination       (Blank rows = not tested)   UE MMT:   MMT Right 10/12/2021 Left 10/12/2021  Shoulder flexion      Shoulder extension      Shoulder abduction  Shoulder adduction      Shoulder extension      Shoulder internal rotation      Shoulder external rotation   Provoked pain no overt weakness  Middle trapezius      Lower trapezius      Elbow flexion      Elbow extension      Wrist flexion      Wrist extension      Wrist ulnar deviation      Wrist radial deviation      Wrist pronation      Wrist supination      Grip strength       (Blank rows = not tested)  10/25/21:  LE MMT: Bilat knees 5/5, Right hip flexion 4+/5, Left hip  flexion 4/5 LE AROM: Bilat knee flexion 130 degrees, extension 0 bilat Left Hip abduction 4/5, right hip abduction4+/5   CERVICAL SPECIAL TESTS:  Spurling's test: Negative and Distraction test: Positive              Shoulder Test: Full and empty can: Positive for pain, but not overt weakness Hawkins-Kennedy positive    FUNCTIONAL TESTS:  5 times sit to stand: 10.6 sec 10/25/21- min pain in knees 2 minute walk test: 393 feet    TODAY'S TREATMENT:  [facility] Adult PT Treatment:                                                DATE: 10/31/21 Therapeutic Exercise: *** Manual Therapy: *** Neuromuscular re-ed: *** Therapeutic Activity: *** Modalities: *** Self Care: ***  [facility] Adult PT Treatment:                                                DATE: 10/25/21 Functional Tests: 5 times sit to stand: 10.6 sec 10/25/21- min pain in knees 2 minute walk test: 393 feet   Therapeutic Exercise: Seated Passive Cervical Retraction 10 reps 10 hold Seated Upper Trapezius Stretch 3 reps 20 hold Gentle Levator Scapulae Stretch  3 reps 20 hold Standing Shoulder Row GTB 3 sets 10 reps 3 hold Supine SLR x 10 each Side hip abduction x 10 each Bridge x 10   [facility] Adult PT Treatment:                                                DATE: 10/24/21 Therapeutic Exercise: Seated Passive Cervical Retraction 10 reps 10 hold Seated Upper Trapezius Stretch 3 reps 20 hold Gentle Levator Scapulae Stretch  3 reps 20 hold Standing Shoulder Row GTB 3 sets 10 reps 3 hold  Manual Therapy: STM to the cervical paraspinals and upper trap and MTPR to the upper trap Suboccipital release   Trigger Point Dry Needling Treatment: Pre-treatment instruction: Patient instructed on dry needling rationale, procedures, and possible side effects including pain during treatment (achy,cramping feeling), bruising, drop of blood, lightheadedness, nausea, sweating. Patient Consent Given: Yes Education handout provided: Yes Muscles  treated: L upper trap  Needle size and number: .30x24m x 1 Electrical stimulation performed: No Parameters: N/A Treatment response/outcome: Twitch response elicited and Palpable decrease  in muscle tension Post-treatment instructions: Patient instructed to expect possible mild to moderate muscle soreness later today and/or tomorrow. Patient instructed in methods to reduce muscle soreness and to continue prescribed HEP. If patient was dry needled over the lung field, patient was instructed on signs and symptoms of pneumothorax and, however unlikely, to see immediate medical attention should they occur. Patient was also educated on signs and symptoms of infection and to seek medical attention should they occur. Patient verbalized understanding of these instructions and education.   Initial Eval Treatment: Cervical retraction 10x 3 sec, pt reported relief of pain L upper trap stretch 2 20 sec, aggravated L upper trap pain   PATIENT EDUCATION:  Education details: eval findings, POC, HEP Person educated: Patient Education method: Explanation, Demonstration, Tactile cues, Verbal cues, and Handouts Education comprehension: verbalized understanding, returned demonstration, verbal cues required, and tactile cues required     [place] Code: Access Code: [email or web address] Access Code: [email or web address] URL: [email or web address] Date: 10/25/2021 Prepared by: Hessie Diener  Exercises - Modified Thomas Stretch  - 1 x daily - 3 reps - 30 hold - Active Straight Leg Raise with Quad Set  - 1 x daily - 3 sets - 10 reps - Supine Bridge  - 1 x daily - 3 sets - 10 reps - Sidelying Hip Abduction  - 1 x daily - 3 sets - 10 reps - Sit to Stand Without Arm Support  - 1 x daily - 3 sets - 10 reps - Heel Raises with Counter Support  - 1 x daily - 3 sets - 20 reps - Standing Tandem Balance with Counter Support  - 1 x daily - 3 reps - 30 seconds hold - Standing Single Leg Stance with Counter  Support  - 1 x daily - 3 reps - 30 seconds hold   ASSESSMENT:   CLINICAL IMPRESSION: Pt reports she is sore from TPDN yesterday and thinks it was helpful for her upper trap. Reviewed HEP and began pec stretch in doorway. MMT and Knee ROM grossly WFL, min hip weakness. Initialed HEP for knees focusing on hip strength. She reports most of her pain is with squatting down which makes her knees swell. See above for 5 x STS and 2 MWT measures.     OBJECTIVE IMPAIRMENTS decreased activity tolerance, decreased mobility, difficulty walking, decreased ROM, decreased strength, increased muscle spasms, impaired flexibility, impaired UE functional use, postural dysfunction, obesity, and pain.      GOALS:   SHORT TERM GOALS: Target date: 11/02/2021    Pt will be Ind in an initial HEP Baseline: started on eval Goal status: INITIAL   2.  Pt will voice understanding of measures to decrease and manage pain Baseline:  Goal status: INITIAL       LONG TERM GOALS: Target date: 11/30/21    Pt will report a decrease L neck/upper shoulder pain to 4/10 or less  with daily activities Baseline: 3-10/10 Goal status: INITIAL   2.  Increase cervical ROM for side bending and rotation by 10d for iproved cervical function and less pain Baseline: see flow sheets Goal status: INITIAL   3.  Pt will demonstrate improved L shoulder function c decrease pain and improved ROM Baseline: pain c L shoulder flexion at 90d Goal status: INITIAL   4.  Pt will be ind in a final HEP to maintain achieved LOF Baseline: Started on eval Goal status: INITIAL     PLAN: PT FREQUENCY: 2x/week   PT DURATION: 6 weeks  PLANNED INTERVENTIONS: Therapeutic exercises, Therapeutic activity, Neuromuscular re-education, Balance training, Gait training, Patient/Family education, Joint mobilization, Aquatic Therapy, Dry Needling, Electrical stimulation, Spinal manipulation, Spinal mobilization, Cryotherapy, Moist heat, Taping,  Vasopneumatic device, Traction, Ultrasound, Ionotophoresis '4mg'$ /ml Dexamethasone, and Manual therapy   PLAN FOR NEXT SESSION: Assess response to LE HEP and progress to closed chain (see mmt and ROM in objective) , assess response to HEP, review Landis Gandy, PTA 10/30/21 10:07 PM Phone: [phone] Fax: [phone]

## 2021-10-31 ENCOUNTER — Ambulatory Visit: Payer: Medicare Other

## 2021-11-02 ENCOUNTER — Encounter: Payer: Self-pay | Admitting: Physical Therapy

## 2021-11-02 ENCOUNTER — Ambulatory Visit: Payer: Medicare Other | Attending: Internal Medicine | Admitting: Physical Therapy

## 2021-11-02 DIAGNOSIS — M6281 Muscle weakness (generalized): Secondary | ICD-10-CM | POA: Insufficient documentation

## 2021-11-02 DIAGNOSIS — G8929 Other chronic pain: Secondary | ICD-10-CM | POA: Insufficient documentation

## 2021-11-02 DIAGNOSIS — M25561 Pain in right knee: Secondary | ICD-10-CM | POA: Diagnosis not present

## 2021-11-02 DIAGNOSIS — M542 Cervicalgia: Secondary | ICD-10-CM | POA: Insufficient documentation

## 2021-11-02 DIAGNOSIS — M25562 Pain in left knee: Secondary | ICD-10-CM | POA: Insufficient documentation

## 2021-11-02 DIAGNOSIS — R252 Cramp and spasm: Secondary | ICD-10-CM | POA: Diagnosis not present

## 2021-11-02 NOTE — Therapy (Addendum)
OUTPATIENT PHYSICAL THERAPY TREATMENT NOTE/Discharge   Patient Name: Theresa Kirk MRN: [id number] DOB:[birth year], 61 y.o., female Today's Date: 11/02/2021  PCP: Aldine Contes, MD REFERRING PROVIDER: Aldine Contes, MD  END OF SESSION:   PT End of Session - 11/02/21 0720     Visit Number 4    Number of Visits 13    Date for PT Re-Evaluation 11/30/21    Authorization Type [employer] MEDICARE; secondary-MEDICAID [place] ACCESS    Progress Note Due on Visit 10    PT Start Time 0716    PT Stop Time 0755    PT Time Calculation (min) 39 min             Past Medical History:  Diagnosis Date   Acute pain of right wrist 02/03/2018   Alcohol abuse    Arthritis    Asthma    Callus of foot 06/07/2008   Qualifier: Diagnosis of  By: Eyvonne Mechanic MD, Vijay     Carpal tunnel syndrome    Cervical lymphadenopathy 09/21/2019   Cervical muscle pain 05/05/2014   - Pt with cervical pain radiating down her neck with no signs of radiculopathy or weakness in upper extremities   Depression    Fibromyalgia    High cholesterol    Irritation symptom of skin 12/30/2017   Past Surgical History:  Procedure Laterality Date   ABDOMINAL HYSTERECTOMY  2008   h/o uterine fibroids, ?partial hysterectomy per pt 04/13/12   CARPAL TUNNEL RELEASE Right 01/05/2021   Procedure: RIGHT CARPAL TUNNEL RELEASE;  Surgeon: [name], MD;  Location: [facility];  Service: Orthopedics;  Laterality: Right;  30 MIN   COLONOSCOPY     FOOT SURGERY Right    KNEE SURGERY     Patient Active Problem List   Diagnosis Date Noted   Porokeratosis 10/02/2021   Candida vaginitis 09/20/2021   Leg cramping 09/18/2021   Acquired hallux limitus of both feet 10/15/2019   Tinnitus 09/21/2019   Hot flashes 04/20/2019   Numerous moles 04/20/2019   Back pain 07/29/2017   Feeling of incomplete bladder emptying 02/27/2017   Screening for cervical cancer 08/14/2016   Prediabetes 09/29/2014   Bilateral knee pain  08/18/2014   Bilateral ocular hypertension 06/27/2014   Cervical pain (neck) 05/05/2014   GERD (gastroesophageal reflux disease) 05/05/2014   Abnormal mammogram with microcalcification 10/15/2013   Chronic cough 12/09/2012   Seasonal allergies 09/23/2012   Healthcare maintenance 04/13/2012   Right knee pain 03/26/2012   Abdominal pain 11/15/2010   Carpal tunnel syndrome 06/13/2010   Callus of foot 06/07/2008   Hyperlipidemia 11/06/2006   Depression 11/06/2006    REFERRING DIAG:  M25.561,M25.562,G89.29 (ICD-10-CM) - Chronic pain of both knees  M54.2 (ICD-10-CM) - Cervical pain (neck)    THERAPY DIAG:  Cervicalgia  Cramp and spasm  Muscle weakness (generalized)  Chronic pain of both knees  Rationale for Evaluation and Treatment Rehabilitation   OBJECTIVE: (objective measures completed at initial evaluation unless otherwise dated)   SUBJECTIVE:  SUBJECTIVE STATEMENT: The neck feels a lot better than it did.  I just have a little pain on the left side of my neck.  PAIN:  Are you having pain?  YES: NPRS scale: 3/10  Pain location: R knees  Pain description: ache , throb sharp Aggravating factors: Prolonged standing and walking Relieving factors: Rest Pain range for knees 3-10/10                        Cervical Pain:  5/10, Pain range for 3-10/10; sharp and cramp                        Aggravated by looking down, right rotation Relieved by rest  PERTINENT HISTORY:  Fibromyalgia, arthritis, depression    PRECAUTIONS: None   WEIGHT BEARING RESTRICTIONS No   FALLS:  Has patient fallen in last 6 months? No   LIVING ENVIRONMENT: Lives with: lives with their family Lives in: House/apartment Able to access home and be mobile within   OCCUPATION: Disability   PLOF:  Independent   PATIENT GOALS : Decrease my neck and knee pain, and improve the strength of my legs   OBJECTIVE:  Xray R knee 4//20/23 DIAGNOSTIC FINDINGS:  IMPRESSION: 1. No fracture or acute finding. 2. Osteoarthritis, most evident involving the medial compartment, mildly progressed from the prior study. Trace joint effusion.    CT Neck 10/08/19 IMPRESSION: 1. No acute or focal lesion to explain the patient's symptoms. 2. No significant cervical adenopathy or soft tissue mass. 3. Mild degenerative changes of the cervical spine are most evident at C5-6 and C6-7.    PATIENT SURVEYS:  FOTO 47%; predicated 62%     COGNITION: Overall cognitive status: Within functional limits for tasks assessed     SENSATION: WFL for light touch    POSTURE:  Marked CT step off with forward head   PALPATION: TTP of the upper traps, L>R             CERVICAL ROM:    Active ROM A/PROM (deg) 10/11/21 AROM 11/02/21  Flexion 50, no L  provocation   Extension 60, min L upper trap provocation   Right lateral flexion 20, min L upper trap provocation   Left lateral flexion 22, sharp L neck provocation   Right rotation 40, min L upper trap provocation 60 slight left neck pain  Left rotation 35, no provocation 60   (Blank rows = not tested)   UE ROM:                        Active ROM Right 10/11/21 Left 10/11/21  Shoulder flexion   Provoked pain at approx 90d and agin c lowering  Shoulder extension      Shoulder abduction      Shoulder adduction      Shoulder extension      Shoulder internal rotation      Shoulder external rotation      Elbow flexion      Elbow extension      Wrist flexion      Wrist extension      Wrist ulnar deviation      Wrist radial deviation      Wrist pronation      Wrist supination       (Blank rows = not tested)   UE MMT:   MMT Right 10/12/2021 Left 10/12/2021  Shoulder flexion  Shoulder extension      Shoulder abduction      Shoulder adduction       Shoulder extension      Shoulder internal rotation      Shoulder external rotation   Provoked pain no overt weakness  Middle trapezius      Lower trapezius      Elbow flexion      Elbow extension      Wrist flexion      Wrist extension      Wrist ulnar deviation      Wrist radial deviation      Wrist pronation      Wrist supination      Grip strength       (Blank rows = not tested)  10/25/21:  LE MMT: Bilat knees 5/5, Right hip flexion 4+/5, Left hip flexion 4/5 LE AROM: Bilat knee flexion 130 degrees, extension 0 bilat Left Hip abduction 4/5, right hip abduction4+/5   CERVICAL SPECIAL TESTS:  Spurling's test: Negative and Distraction test: Positive              Shoulder Test: Full and empty can: Positive for pain, but not overt weakness Hawkins-Kennedy positive    FUNCTIONAL TESTS:  5 times sit to stand: 10.6 sec 10/25/21- min pain in knees 2 minute walk test: 393 feet    TODAY'S TREATMENT:  [facility] Adult PT Treatment:                                                DATE: 11/02/21   Therapeutic Exercise: Bridge x 10 5 sec Blue band Clam x 20 Banded bridge x 10 5 sec SLR 10 x 2 each Side hip abduction 10 x 2 each Sit-stand from standard chair x 10  Seated Passive Cervical Retraction 10 reps 10 hold Seated Upper Trapezius Stretch 3 reps 20 hold Gentle Levator Scapulae Stretch  3 reps 20 hold Standing Shoulder Row Black TB 2 sets 10 reps 3 hold Standing green band shoulder ext 10 x 2  Supine chin tuck over towel Roll 5 sec x 10  Pec stretch in doorway  x 4   [facility] Adult PT Treatment:                                                DATE: 10/25/21 Functional Tests: 5 times sit to stand: 10.6 sec 10/25/21- min pain in knees 2 minute walk test: 393 feet   Therapeutic Exercise: Seated Passive Cervical Retraction 10 reps 10 hold Seated Upper Trapezius Stretch 3 reps 20 hold Gentle Levator Scapulae Stretch  3 reps 20 hold Standing Shoulder Row GTB 3 sets 10 reps 3  hold Supine SLR x 10 each Side hip abduction x 10 each Bridge x 10   [facility] Adult PT Treatment:                                                DATE: 10/24/21 Therapeutic Exercise: Seated Passive Cervical Retraction 10 reps 10 hold Seated Upper Trapezius Stretch 3 reps 20 hold Gentle Levator Scapulae Stretch  3  reps 20 hold Standing Shoulder Row GTB 3 sets 10 reps 3 hold  Manual Therapy: STM to the cervical paraspinals and upper trap and MTPR to the upper trap Suboccipital release   Trigger Point Dry Needling Treatment: Pre-treatment instruction: Patient instructed on dry needling rationale, procedures, and possible side effects including pain during treatment (achy,cramping feeling), bruising, drop of blood, lightheadedness, nausea, sweating. Patient Consent Given: Yes Education handout provided: Yes Muscles treated: L upper trap  Needle size and number: .30x86m x 1 Electrical stimulation performed: No Parameters: N/A Treatment response/outcome: Twitch response elicited and Palpable decrease in muscle tension Post-treatment instructions: Patient instructed to expect possible mild to moderate muscle soreness later today and/or tomorrow. Patient instructed in methods to reduce muscle soreness and to continue prescribed HEP. If patient was dry needled over the lung field, patient was instructed on signs and symptoms of pneumothorax and, however unlikely, to see immediate medical attention should they occur. Patient was also educated on signs and symptoms of infection and to seek medical attention should they occur. Patient verbalized understanding of these instructions and education.   Initial Eval Treatment: Cervical retraction 10x 3 sec, pt reported relief of pain L upper trap stretch 2 20 sec, aggravated L upper trap pain   PATIENT EDUCATION:  Education details: eval findings, POC, HEP Person educated: Patient Education method: Explanation, Demonstration, Tactile cues, Verbal cues,  and Handouts Education comprehension: verbalized understanding, returned demonstration, verbal cues required, and tactile cues required     HLynchCode: Access Code: [email or web address] URL: [email or web address] Date: 11/02/2021 Prepared by: JHessie Diener Exercises - Seated Passive Cervical Retraction  - 6 x daily - 7 x weekly - 1 sets - 5 reps - 10 hold - Seated Upper Trapezius Stretch (Mirrored)  - 2 x daily - 7 x weekly - 1 sets - 3 reps - 20 hold - Gentle Levator Scapulae Stretch  - 2 x daily - 7 x weekly - 1 sets - 3 reps - 20 hold - Standing Shoulder Row with Anchored Resistance  - 1 x daily - 7 x weekly - 3 sets - 10 reps - 3 hold - Supine Active Straight Leg Raise  - 1 x daily - 7 x weekly - 2 sets - 10 reps - Supine Bridge  - 1 x daily - 7 x weekly - 2 sets - 10 reps - Sidelying Hip Abduction  - 1 x daily - 7 x weekly - 2 sets - 10 reps - Doorway Pec Stretch at 90 Degrees Abduction  - 1 x daily - 7 x weekly - 1 sets - 3 reps - 15-20 hold     ASSESSMENT:   CLINICAL IMPRESSION: Pt reports difficulty with HEP compliance due to rainy weather and deaths in her family this week. Her pain in neck is rated at 5-6/10 and her right knee pain is 3/10 with ambulation.  Reviewed HEP for neck and back with progression of stabilization for neck and hips. Initiated closed chain LE strength with good tolerance. Her neck AROM has improved and she notes decreased pain with neck ROM. STGs not yet met.     OBJECTIVE IMPAIRMENTS decreased activity tolerance, decreased mobility, difficulty walking, decreased ROM, decreased strength, increased muscle spasms, impaired flexibility, impaired UE functional use, postural dysfunction, obesity, and pain.      GOALS:   SHORT TERM GOALS: Target date: 11/02/2021    Pt will be Ind in an initial HEP Baseline: started on eval Goal status: ONGOING  2.  Pt will voice understanding of measures to decrease and manage pain Baseline:   Goal status: ONGOING       LONG TERM GOALS: Target date: 11/30/21    Pt will report a decrease L neck/upper shoulder pain to 4/10 or less  with daily activities Baseline: 3-10/10 Goal status: INITIAL   2.  Increase cervical ROM for side bending and rotation by 10d for iproved cervical function and less pain Baseline: see flow sheets Goal status: INITIAL   3.  Pt will demonstrate improved L shoulder function c decrease pain and improved ROM Baseline: pain c L shoulder flexion at 90d Goal status: INITIAL   4.  Pt will be ind in a final HEP to maintain achieved LOF Baseline: Started on eval Goal status: INITIAL     PLAN: PT FREQUENCY: 2x/week   PT DURATION: 6 weeks   PLANNED INTERVENTIONS: Therapeutic exercises, Therapeutic activity, Neuromuscular re-education, Balance training, Gait training, Patient/Family education, Joint mobilization, Aquatic Therapy, Dry Needling, Electrical stimulation, Spinal manipulation, Spinal mobilization, Cryotherapy, Moist heat, Taping, Vasopneumatic device, Traction, Ultrasound, Ionotophoresis 60m/ml Dexamethasone, and Manual therapy   PLAN FOR NEXT SESSION: Assess response to LE HEP and progress to closed chain (see mmt and ROM in objective) , assess response to HEP, review FOTO, formally add Sit-stand to [employer] P[employer] 11/02/21 7:58 AM Phone: [phone]: [phone]  PHYSICAL THERAPY DISCHARGE SUMMARY  Visits from Start of Care: 4  Current functional level related to goals / functional outcomes: Unknown, pt stopped attending PT   Remaining deficits: Unknown, pt stopped attending PT   Education / Equipment: HEP   Patient agrees to discharge. Patient goals were not met. Patient is being discharged due to not returning since the last visit.  AGar PontoMS, PT

## 2021-11-06 ENCOUNTER — Ambulatory Visit: Payer: Medicare Other

## 2021-11-06 ENCOUNTER — Other Ambulatory Visit: Payer: Self-pay | Admitting: Internal Medicine

## 2021-11-06 DIAGNOSIS — E785 Hyperlipidemia, unspecified: Secondary | ICD-10-CM

## 2021-11-07 NOTE — Therapy (Deleted)
OUTPATIENT PHYSICAL THERAPY TREATMENT NOTE   Patient Name: Theresa Kirk MRN: [id number] DOB:[birth year], 61 y.o., female Today's Date: 11/07/2021  PCP: Aldine Contes, MD REFERRING PROVIDER: Aldine Contes, MD  END OF SESSION:     Past Medical History:  Diagnosis Date   Acute pain of right wrist 02/03/2018   Alcohol abuse    Arthritis    Asthma    Callus of foot 06/07/2008   Qualifier: Diagnosis of  By: Eyvonne Mechanic MD, Vijay     Carpal tunnel syndrome    Cervical lymphadenopathy 09/21/2019   Cervical muscle pain 05/05/2014   - Pt with cervical pain radiating down her neck with no signs of radiculopathy or weakness in upper extremities   Depression    Fibromyalgia    High cholesterol    Irritation symptom of skin 12/30/2017   Past Surgical History:  Procedure Laterality Date   ABDOMINAL HYSTERECTOMY  2008   h/o uterine fibroids, ?partial hysterectomy per pt 04/13/12   CARPAL TUNNEL RELEASE Right 01/05/2021   Procedure: RIGHT CARPAL TUNNEL RELEASE;  Surgeon: [name], MD;  Location: [facility];  Service: Orthopedics;  Laterality: Right;  30 MIN   COLONOSCOPY     FOOT SURGERY Right    KNEE SURGERY     Patient Active Problem List   Diagnosis Date Noted   Porokeratosis 10/02/2021   Candida vaginitis 09/20/2021   Leg cramping 09/18/2021   Acquired hallux limitus of both feet 10/15/2019   Tinnitus 09/21/2019   Hot flashes 04/20/2019   Numerous moles 04/20/2019   Back pain 07/29/2017   Feeling of incomplete bladder emptying 02/27/2017   Screening for cervical cancer 08/14/2016   Prediabetes 09/29/2014   Bilateral knee pain 08/18/2014   Bilateral ocular hypertension 06/27/2014   Cervical pain (neck) 05/05/2014   GERD (gastroesophageal reflux disease) 05/05/2014   Abnormal mammogram with microcalcification 10/15/2013   Chronic cough 12/09/2012   Seasonal allergies 09/23/2012   Healthcare maintenance 04/13/2012   Right knee pain 03/26/2012    Abdominal pain 11/15/2010   Carpal tunnel syndrome 06/13/2010   Callus of foot 06/07/2008   Hyperlipidemia 11/06/2006   Depression 11/06/2006    REFERRING DIAG:  M25.561,M25.562,G89.29 (ICD-10-CM) - Chronic pain of both knees  M54.2 (ICD-10-CM) - Cervical pain (neck)    THERAPY DIAG:  No diagnosis found.  Rationale for Evaluation and Treatment Rehabilitation   OBJECTIVE: (objective measures completed at initial evaluation unless otherwise dated)   SUBJECTIVE:  SUBJECTIVE STATEMENT: The neck feels a lot better than it did.  I just have a little pain on the left side of my neck.  PAIN:  Are you having pain?  YES: NPRS scale: 3/10  Pain location: R knees  Pain description: ache , throb sharp Aggravating factors: Prolonged standing and walking Relieving factors: Rest Pain range for knees 3-10/10                        Cervical Pain:  5/10, Pain range for 3-10/10; sharp and cramp                        Aggravated by looking down, right rotation Relieved by rest  PERTINENT HISTORY:  Fibromyalgia, arthritis, depression    PRECAUTIONS: None   WEIGHT BEARING RESTRICTIONS No   FALLS:  Has patient fallen in last 6 months? No   LIVING ENVIRONMENT: Lives with: lives with their family Lives in: House/apartment Able to access home and be mobile within   OCCUPATION: Disability   PLOF: Independent   PATIENT GOALS : Decrease my neck and knee pain, and improve the strength of my legs   OBJECTIVE:  Xray R knee 4//20/23 DIAGNOSTIC FINDINGS:  IMPRESSION: 1. No fracture or acute finding. 2. Osteoarthritis, most evident involving the medial compartment, mildly progressed from the prior study. Trace joint effusion.    CT Neck 10/08/19 IMPRESSION: 1. No acute or focal lesion to  explain the patient's symptoms. 2. No significant cervical adenopathy or soft tissue mass. 3. Mild degenerative changes of the cervical spine are most evident at C5-6 and C6-7.    PATIENT SURVEYS:  FOTO 47%; predicated 62%     COGNITION: Overall cognitive status: Within functional limits for tasks assessed     SENSATION: WFL for light touch    POSTURE:  Marked CT step off with forward head   PALPATION: TTP of the upper traps, L>R             CERVICAL ROM:    Active ROM A/PROM (deg) 10/11/21 AROM 11/02/21  Flexion 50, no L  provocation   Extension 60, min L upper trap provocation   Right lateral flexion 20, min L upper trap provocation   Left lateral flexion 22, sharp L neck provocation   Right rotation 40, min L upper trap provocation 60 slight left neck pain  Left rotation 35, no provocation 60   (Blank rows = not tested)   UE ROM:                        Active ROM Right 10/11/21 Left 10/11/21  Shoulder flexion   Provoked pain at approx 90d and agin c lowering  Shoulder extension      Shoulder abduction      Shoulder adduction      Shoulder extension      Shoulder internal rotation      Shoulder external rotation      Elbow flexion      Elbow extension      Wrist flexion      Wrist extension      Wrist ulnar deviation      Wrist radial deviation      Wrist pronation      Wrist supination       (Blank rows = not tested)   UE MMT:   MMT Right 10/12/2021 Left 10/12/2021  Shoulder flexion  Shoulder extension      Shoulder abduction      Shoulder adduction      Shoulder extension      Shoulder internal rotation      Shoulder external rotation   Provoked pain no overt weakness  Middle trapezius      Lower trapezius      Elbow flexion      Elbow extension      Wrist flexion      Wrist extension      Wrist ulnar deviation      Wrist radial deviation      Wrist pronation      Wrist supination      Grip strength       (Blank rows = not  tested)  10/25/21:  LE MMT: Bilat knees 5/5, Right hip flexion 4+/5, Left hip flexion 4/5 LE AROM: Bilat knee flexion 130 degrees, extension 0 bilat Left Hip abduction 4/5, right hip abduction4+/5   CERVICAL SPECIAL TESTS:  Spurling's test: Negative and Distraction test: Positive              Shoulder Test: Full and empty can: Positive for pain, but not overt weakness Hawkins-Kennedy positive    FUNCTIONAL TESTS:  5 times sit to stand: 10.6 sec 10/25/21- min pain in knees 2 minute walk test: 393 feet    TODAY'S TREATMENT:   [facility] Adult PT Treatment:                                                DATE: 11/08/21 Therapeutic Exercise: *** Manual Therapy: *** Neuromuscular re-ed: *** Therapeutic Activity: *** Modalities: *** Self Care: ***    [facility] Adult PT Treatment:                                                DATE: 11/02/21   Therapeutic Exercise: Bridge x 10 5 sec Blue band Clam x 20 Banded bridge x 10 5 sec SLR 10 x 2 each Side hip abduction 10 x 2 each Sit-stand from standard chair x 10  Seated Passive Cervical Retraction 10 reps 10 hold Seated Upper Trapezius Stretch 3 reps 20 hold Gentle Levator Scapulae Stretch  3 reps 20 hold Standing Shoulder Row Black TB 2 sets 10 reps 3 hold Standing green band shoulder ext 10 x 2  Supine chin tuck over towel Roll 5 sec x 10  Pec stretch in doorway  x 4   [facility] Adult PT Treatment:                                                DATE: 10/25/21 Functional Tests: 5 times sit to stand: 10.6 sec 10/25/21- min pain in knees 2 minute walk test: 393 feet   Therapeutic Exercise: Seated Passive Cervical Retraction 10 reps 10 hold Seated Upper Trapezius Stretch 3 reps 20 hold Gentle Levator Scapulae Stretch  3 reps 20 hold Standing Shoulder Row GTB 3 sets 10 reps 3 hold Supine SLR x 10 each Side hip abduction x 10 each Bridge x 10   [facility] Adult PT Treatment:  DATE:  10/24/21 Therapeutic Exercise: Seated Passive Cervical Retraction 10 reps 10 hold Seated Upper Trapezius Stretch 3 reps 20 hold Gentle Levator Scapulae Stretch  3 reps 20 hold Standing Shoulder Row GTB 3 sets 10 reps 3 hold  Manual Therapy: STM to the cervical paraspinals and upper trap and MTPR to the upper trap Suboccipital release   Trigger Point Dry Needling Treatment: Pre-treatment instruction: Patient instructed on dry needling rationale, procedures, and possible side effects including pain during treatment (achy,cramping feeling), bruising, drop of blood, lightheadedness, nausea, sweating. Patient Consent Given: Yes Education handout provided: Yes Muscles treated: L upper trap  Needle size and number: .30x22m x 1 Electrical stimulation performed: No Parameters: N/A Treatment response/outcome: Twitch response elicited and Palpable decrease in muscle tension Post-treatment instructions: Patient instructed to expect possible mild to moderate muscle soreness later today and/or tomorrow. Patient instructed in methods to reduce muscle soreness and to continue prescribed HEP. If patient was dry needled over the lung field, patient was instructed on signs and symptoms of pneumothorax and, however unlikely, to see immediate medical attention should they occur. Patient was also educated on signs and symptoms of infection and to seek medical attention should they occur. Patient verbalized understanding of these instructions and education.   Initial Eval Treatment: Cervical retraction 10x 3 sec, pt reported relief of pain L upper trap stretch 2 20 sec, aggravated L upper trap pain   PATIENT EDUCATION:  Education details: eval findings, POC, HEP Person educated: Patient Education method: Explanation, Demonstration, Tactile cues, Verbal cues, and Handouts Education comprehension: verbalized understanding, returned demonstration, verbal cues required, and tactile cues required     HWilmerdingCode: Access Code: [email or web address] URL: [email or web address] Date: 11/02/2021 Prepared by: JHessie Diener Exercises - Seated Passive Cervical Retraction  - 6 x daily - 7 x weekly - 1 sets - 5 reps - 10 hold - Seated Upper Trapezius Stretch (Mirrored)  - 2 x daily - 7 x weekly - 1 sets - 3 reps - 20 hold - Gentle Levator Scapulae Stretch  - 2 x daily - 7 x weekly - 1 sets - 3 reps - 20 hold - Standing Shoulder Row with Anchored Resistance  - 1 x daily - 7 x weekly - 3 sets - 10 reps - 3 hold - Supine Active Straight Leg Raise  - 1 x daily - 7 x weekly - 2 sets - 10 reps - Supine Bridge  - 1 x daily - 7 x weekly - 2 sets - 10 reps - Sidelying Hip Abduction  - 1 x daily - 7 x weekly - 2 sets - 10 reps - Doorway Pec Stretch at 90 Degrees Abduction  - 1 x daily - 7 x weekly - 1 sets - 3 reps - 15-20 hold     ASSESSMENT:   CLINICAL IMPRESSION: Pt reports difficulty with HEP compliance due to rainy weather and deaths in her family this week. Her pain in neck is rated at 5-6/10 and her right knee pain is 3/10 with ambulation.  Reviewed HEP for neck and back with progression of stabilization for neck and hips. Initiated closed chain LE strength with good tolerance. Her neck AROM has improved and she notes decreased pain with neck ROM. STGs not yet met.     OBJECTIVE IMPAIRMENTS decreased activity tolerance, decreased mobility, difficulty walking, decreased ROM, decreased strength, increased muscle spasms, impaired flexibility, impaired UE functional use, postural dysfunction, obesity, and pain.      GOALS:  SHORT TERM GOALS: Target date: 11/02/2021    Pt will be Ind in an initial HEP Baseline: started on eval Goal status: ONGOING   2.  Pt will voice understanding of measures to decrease and manage pain Baseline:  Goal status: ONGOING       LONG TERM GOALS: Target date: 11/30/21    Pt will report a decrease L neck/upper shoulder pain to 4/10 or less   with daily activities Baseline: 3-10/10 Goal status: INITIAL   2.  Increase cervical ROM for side bending and rotation by 10d for iproved cervical function and less pain Baseline: see flow sheets Goal status: INITIAL   3.  Pt will demonstrate improved L shoulder function c decrease pain and improved ROM Baseline: pain c L shoulder flexion at 90d Goal status: INITIAL   4.  Pt will be ind in a final HEP to maintain achieved LOF Baseline: Started on eval Goal status: INITIAL     PLAN: PT FREQUENCY: 2x/week   PT DURATION: 6 weeks   PLANNED INTERVENTIONS: Therapeutic exercises, Therapeutic activity, Neuromuscular re-education, Balance training, Gait training, Patient/Family education, Joint mobilization, Aquatic Therapy, Dry Needling, Electrical stimulation, Spinal manipulation, Spinal mobilization, Cryotherapy, Moist heat, Taping, Vasopneumatic device, Traction, Ultrasound, Ionotophoresis 61m/ml Dexamethasone, and Manual therapy   PLAN FOR NEXT SESSION: Assess response to LE HEP and progress to closed chain (see mmt and ROM in objective) , assess response to HEP, review FOTO, formally add Sit-stand to [employer] PTA 11/07/21 4:07 PM Phone: [phone]: [phone]

## 2021-11-08 ENCOUNTER — Ambulatory Visit: Payer: Medicare Other | Admitting: Physical Therapy

## 2021-11-08 ENCOUNTER — Telehealth: Payer: Self-pay | Admitting: Physical Therapy

## 2021-11-08 NOTE — Telephone Encounter (Signed)
Patient missed her appt today , called regarding this and was unable to leave a voicemail.     Raeford Razor, PT 11/08/21 4:16 PM Phone: [phone] Fax: [phone]

## 2021-11-13 ENCOUNTER — Ambulatory Visit: Payer: Medicare Other

## 2021-11-15 ENCOUNTER — Ambulatory Visit: Payer: Medicare Other

## 2021-11-20 ENCOUNTER — Ambulatory Visit: Payer: Medicare Other

## 2022-01-08 ENCOUNTER — Ambulatory Visit: Payer: Medicare Other | Admitting: Podiatry

## 2022-05-22 ENCOUNTER — Ambulatory Visit (INDEPENDENT_AMBULATORY_CARE_PROVIDER_SITE_OTHER): Payer: Medicare Other | Admitting: Student

## 2022-05-22 ENCOUNTER — Encounter: Payer: Self-pay | Admitting: *Deleted

## 2022-05-22 VITALS — BP 124/73 | HR 78 | Wt 203.5 lb

## 2022-05-22 DIAGNOSIS — R053 Chronic cough: Secondary | ICD-10-CM

## 2022-05-22 DIAGNOSIS — R232 Flushing: Secondary | ICD-10-CM

## 2022-05-22 DIAGNOSIS — F32A Depression, unspecified: Secondary | ICD-10-CM | POA: Diagnosis not present

## 2022-05-22 MED ORDER — PANTOPRAZOLE SODIUM 40 MG PO TBEC: DELAYED_RELEASE_TABLET | ORAL | 1 refills | Status: DC

## 2022-05-22 MED ORDER — CETIRIZINE HCL 10 MG PO CAPS: 10.0000 mg | ORAL_CAPSULE | Freq: Every day | ORAL | 0 refills | Status: DC

## 2022-05-22 NOTE — Assessment & Plan Note (Signed)
Assessment: Continues to have symptoms and has not been taking her celexa. On chart review appears symptoms improved with celexa.   Plan: - follow up with [facility] for depression/anxiety medication managment

## 2022-05-22 NOTE — Patient Instructions (Signed)
Thank you, Ms.Ramiya Boehne for allowing [place] to provide your care today. Today we discussed.  Cough Please continue to take your acid reflux medicine protonix and restart your cetirizine. If you continue to have the cough over the next 2-4 weeks and/or you start noticing new symptoms please give [place] a call. I would also recommend you stop/decrease your marijuana use.   Anxiety/depression Please follow up with your provider at [facility] to refill your medications to help with the above.  I believe better controlling these symptoms will also help with your marijuana use.   I have ordered the following labs for you:  Lab Orders  No laboratory test(s) ordered today     Referrals ordered today:   Referral Orders  No referral(s) requested today     I have ordered the following medication/changed the following medications:   Stop the following medications: Medications Discontinued During This Encounter  Medication Reason   pantoprazole (PROTONIX) 40 MG tablet Reorder   Cetirizine HCl 10 MG CAPS Reorder     Start the following medications: Meds ordered this encounter  Medications   Cetirizine HCl 10 MG CAPS    Sig: Take 1 capsule (10 mg total) by mouth daily.    Dispense:  60 capsule    Refill:  0   pantoprazole (PROTONIX) 40 MG tablet    Sig: TAKE 1 TABLET BY MOUTH EVERY DAY(TAKE 30 MINS BEFORE MEALS)    Dispense:  90 tablet    Refill:  1     Follow up:  1 month  if needed  Should you have any questions or concerns please call the internal medicine clinic at [phone].    Sanjuana Letters, D.O. [place]

## 2022-05-22 NOTE — Assessment & Plan Note (Signed)
Assessment: Has not followed up with [facility] in some time. Encouraged her to reach out to have her medications refilled. I believe her symptoms of depression/anxiety are contributing to her frequent marijuana use  Plan: -follow up with [facility] for medication managment

## 2022-05-22 NOTE — Assessment & Plan Note (Signed)
Assessment: She presents with exacerbation of her chronic cough for the past month after URI symptoms. She denies improvement with her protonix and has not taken anything over the counter to help. She denies shortness of breath. On exam her lungs are clear to auscultation.  I believe she has multiple reasons for exacerbation of her chronic cough, including continued nasal drainage, frequent marijuana use, and her recent URI.   Plan: -refill cetirizine and protonix -decrease marijuana use -if no improvement return in 2-4 weeks and consider ICS inhaler for post infectious reactive airway disease

## 2022-05-22 NOTE — Progress Notes (Signed)
CC: Exacerbation of chronic cough  HPI:  Theresa Kirk is a 61 y.o. female living with a history stated below and presents today for exacerbation of chronic cough. Please see problem based assessment and plan for additional details.  Past Medical History:  Diagnosis Date   Acute pain of right wrist 02/03/2018   Alcohol abuse    Arthritis    Asthma    Callus of foot 06/07/2008   Qualifier: Diagnosis of  By: Eyvonne Mechanic MD, Vijay     Carpal tunnel syndrome    Cervical lymphadenopathy 09/21/2019   Cervical muscle pain 05/05/2014   - Pt with cervical pain radiating down her neck with no signs of radiculopathy or weakness in upper extremities   Depression    Fibromyalgia    High cholesterol    Irritation symptom of skin 12/30/2017    Current Outpatient Medications on File Prior to Visit  Medication Sig Dispense Refill   albuterol (VENTOLIN HFA) 108 (90 Base) MCG/ACT inhaler Inhale 1-2 puffs into the lungs every 6 (six) hours as needed for wheezing or shortness of breath. 18 g 0   ARIPiprazole (ABILIFY) 10 MG tablet Take 1 tablet (10 mg total) by mouth daily. 30 tablet 0   buPROPion (WELLBUTRIN) 75 MG tablet Take 1 tablet (75 mg total) by mouth daily. 30 tablet 0   citalopram (CELEXA) 20 MG tablet TAKE 1 TABLET(20 MG) BY MOUTH DAILY 30 tablet 2   cloNIDine (CATAPRES) 0.2 MG tablet Take 1 tablet (0.2 mg total) by mouth 2 (two) times daily. 60 tablet 0   cyclobenzaprine (FLEXERIL) 5 MG tablet Take 1 tablet (5 mg total) by mouth 3 (three) times daily as needed for muscle spasms. 30 tablet 0   fluconazole (DIFLUCAN) 150 MG tablet Take 1 tablet (150 mg total) by mouth daily. 1 tablet 0   MYRBETRIQ 50 MG TB24 tablet Take 1 tablet (50 mg total) by mouth daily. 30 tablet 3   rOPINIRole (REQUIP) 0.5 MG tablet TAKE 1 TABLET(0.5 MG) BY MOUTH AT BEDTIME 90 tablet 0   rosuvastatin (CRESTOR) 20 MG tablet TAKE 1 TABLET(20 MG) BY MOUTH DAILY 90 tablet 1   No current facility-administered medications  on file prior to visit.    Family History  Problem Relation Age of Onset   Diabetes Mother    Hypertension Mother    Stroke Mother    Diabetes Sister    Thyroid disease Sister    Hypertension Sister    Cancer Maternal Aunt        unknown   Breast cancer Maternal Aunt     Social History   Socioeconomic History   Marital status: Single    Spouse name: Not on file   Number of children: Not on file   Years of education: Not on file   Highest education level: Not on file  Occupational History   Not on file  Tobacco Use   Smoking status: Former    Types: Cigarettes    Quit date: 06/03/2000    Years since quitting: 21.9   Smokeless tobacco: Never  Substance and Sexual Activity   Alcohol use: Yes    Comment: former heavy drinker; but not current   Drug use: Yes    Types: Marijuana    Comment: 01/04/2021   Sexual activity: Not on file  Other Topics Concern   Not on file  Social History Narrative   Lives with mother in [place].   Currently unemployed.   Used to work at [employer]  in [place].   ETOH use: weekend, 1-2 beers from [employer]: used to drink everyday   Former smoker   Current THC use   H/o cocaine use: quit in 2001.   Social Determinants of Health   Financial Resource Strain: Medium Risk (08/24/2021)   Overall Financial Resource Strain (CARDIA)    Difficulty of Paying Living Expenses: Somewhat hard  Food Insecurity: No Food Insecurity (08/24/2021)   Hunger Vital Sign    Worried About Running Out of Food in the Last Year: Never true    Ran Out of Food in the Last Year: Never true  Transportation Needs: No Transportation Needs (08/24/2021)   PRAPARE - Hydrologist (Medical): No    Lack of Transportation (Non-Medical): No  Physical Activity: Inactive (08/24/2021)   Exercise Vital Sign    Days of Exercise per Week: 0 days    Minutes of Exercise per Session: 0 min  Stress: Stress Concern Present (08/24/2021)   [place]    Feeling of Stress : Very much  Social Connections: Moderately Isolated (08/24/2021)   Social Connection and Isolation Panel [NHANES]    Frequency of Communication with Friends and Family: More than three times a week    Frequency of Social Gatherings with Friends and Family: Once a week    Attends Religious Services: Never    Marine scientist or Organizations: No    Attends Archivist Meetings: Never    Marital Status: Married  Human resources officer Violence: Not At Risk (08/24/2021)   Humiliation, Afraid, Rape, and Kick questionnaire    Fear of Current or Ex-Partner: No    Emotionally Abused: No    Physically Abused: No    Sexually Abused: No    Review of Systems: ROS negative except for what is noted on the assessment and plan.  Vitals:   05/22/22 0910  BP: 124/73  Pulse: 78  SpO2: 100%  Weight: 203 lb 8 oz (92.3 kg)    Physical Exam: Constitutional: no acute distress HENT: normocephalic atraumatic, mucous membranes moist. Nasal turbinates erythematous Eyes: conjunctiva non-erythematous Neck: supple Cardiovascular: regular rate Pulmonary/Chest: normal work of breathing on room air, lungs clear to auscultation bilaterally Abdominal: soft, non-tender, non-distended MSK: normal bulk and tone Skin: warm and dry Psych: normal mood  Assessment & Plan:   Chronic cough Assessment: She presents with exacerbation of her chronic cough for the past month after URI symptoms. She denies improvement with her protonix and has not taken anything over the counter to help. She denies shortness of breath. On exam her lungs are clear to auscultation.  I believe she has multiple reasons for exacerbation of her chronic cough, including continued nasal drainage, frequent marijuana use, and her recent URI.   Plan: -refill cetirizine and protonix -decrease marijuana use -if no improvement return in 2-4 weeks and consider  ICS inhaler for post infectious reactive airway disease   Depression Assessment: Has not followed up with [facility] in some time. Encouraged her to reach out to have her medications refilled. I believe her symptoms of depression/anxiety are contributing to her frequent marijuana use  Plan: -follow up with [facility] for medication managment  Hot flashes Assessment: Continues to have symptoms and has not been taking her celexa. On chart review appears symptoms improved with celexa.   Plan: - follow up with [facility] for depression/anxiety medication managment  Patient discussed with Dr. Laurena Slimmer, D.O. [place]  Internal Medicine, PGY-3 Phone: [phone] Date 05/22/2022 Time 9:49 AM

## 2022-05-23 NOTE — Progress Notes (Signed)
Internal Medicine Clinic Attending  Case discussed with Dr. Katsadouros  At the time of the visit.  We reviewed the resident's history and exam and pertinent patient test results.  I agree with the assessment, diagnosis, and plan of care documented in the resident's note.  

## 2022-08-12 ENCOUNTER — Encounter: Payer: Self-pay | Admitting: Internal Medicine

## 2022-08-12 ENCOUNTER — Ambulatory Visit (INDEPENDENT_AMBULATORY_CARE_PROVIDER_SITE_OTHER): Payer: 59 | Admitting: Internal Medicine

## 2022-08-12 VITALS — BP 120/74 | HR 73 | Temp 97.8°F | Ht 65.0 in | Wt 205.6 lb

## 2022-08-12 DIAGNOSIS — M79672 Pain in left foot: Secondary | ICD-10-CM

## 2022-08-12 DIAGNOSIS — G2581 Restless legs syndrome: Secondary | ICD-10-CM | POA: Insufficient documentation

## 2022-08-12 DIAGNOSIS — G8929 Other chronic pain: Secondary | ICD-10-CM

## 2022-08-12 DIAGNOSIS — Z1231 Encounter for screening mammogram for malignant neoplasm of breast: Secondary | ICD-10-CM | POA: Insufficient documentation

## 2022-08-12 DIAGNOSIS — G5603 Carpal tunnel syndrome, bilateral upper limbs: Secondary | ICD-10-CM

## 2022-08-12 DIAGNOSIS — F32A Depression, unspecified: Secondary | ICD-10-CM

## 2022-08-12 DIAGNOSIS — G629 Polyneuropathy, unspecified: Secondary | ICD-10-CM | POA: Diagnosis not present

## 2022-08-12 DIAGNOSIS — J302 Other seasonal allergic rhinitis: Secondary | ICD-10-CM | POA: Diagnosis not present

## 2022-08-12 DIAGNOSIS — R1310 Dysphagia, unspecified: Secondary | ICD-10-CM

## 2022-08-12 DIAGNOSIS — K219 Gastro-esophageal reflux disease without esophagitis: Secondary | ICD-10-CM | POA: Diagnosis not present

## 2022-08-12 DIAGNOSIS — R7303 Prediabetes: Secondary | ICD-10-CM

## 2022-08-12 DIAGNOSIS — E785 Hyperlipidemia, unspecified: Secondary | ICD-10-CM | POA: Diagnosis not present

## 2022-08-12 DIAGNOSIS — M545 Low back pain, unspecified: Secondary | ICD-10-CM

## 2022-08-12 DIAGNOSIS — F331 Major depressive disorder, recurrent, moderate: Secondary | ICD-10-CM

## 2022-08-12 DIAGNOSIS — R3914 Feeling of incomplete bladder emptying: Secondary | ICD-10-CM

## 2022-08-12 HISTORY — DX: Encounter for screening mammogram for malignant neoplasm of breast: Z12.31

## 2022-08-12 HISTORY — DX: Pain in left foot: M79.672

## 2022-08-12 MED ORDER — ROPINIROLE HCL 0.5 MG PO TABS: 0.5000 mg | ORAL_TABLET | Freq: Every day | ORAL | 0 refills | Status: DC

## 2022-08-12 MED ORDER — CITALOPRAM HYDROBROMIDE 20 MG PO TABS: ORAL_TABLET | ORAL | 2 refills | Status: DC

## 2022-08-12 MED ORDER — GABAPENTIN 100 MG PO CAPS: 100.0000 mg | ORAL_CAPSULE | Freq: Three times a day (TID) | ORAL | 0 refills | Status: DC

## 2022-08-12 NOTE — Assessment & Plan Note (Signed)
History of RLS, previously treated with ropinirole 0.5 mg qhs. She has been off this for some time and notes nightly feeling of restless legs interrupting sleep.  Plan -Start ropinirole 0.5 mg qhs -Iron studies

## 2022-08-12 NOTE — Assessment & Plan Note (Signed)
History of prediabetes with A1c 6.1 05/2021. Will check A1c today for progression.

## 2022-08-12 NOTE — Assessment & Plan Note (Signed)
Using cetirizine as needed for seasonal allergies. Continue current regimen.

## 2022-08-12 NOTE — Assessment & Plan Note (Signed)
Elevated LDL on past lipid panels. Previously prescribed high-intensity statin, however Theresa Kirk has not been taking this as she has changed her diet and reports prior provider told her it was okay to stop taking this. We will recheck lipid panel today and discuss resuming statin if needed.  Plan -Lipid panel

## 2022-08-12 NOTE — Progress Notes (Signed)
Established Patient Office Visit  Subjective   Patient ID: Theresa Kirk, female    DOB: Apr 18, 1961  Age: 62 y.o. MRN: [id number]  Chief Complaint  Patient presents with   Foot Pain    Left foot pain with walking X 43mh    Numbness    C/o numbness/tingling sensation bilateral lower ext     Ms. HTainterreturns to clinic today for follow-up of chronic conditions as well as discussion of lower extremity symptoms and left foot pain. Please see assessment/plan in problem-based charting for further details of today's visit.     Patient Active Problem List   Diagnosis Date Noted   Restless leg syndrome 08/12/2022   Screening mammogram for breast cancer 08/12/2022   Peripheral polyneuropathy 08/12/2022   Foot pain, left 08/12/2022   Porokeratosis 10/02/2021   Leg cramping 09/18/2021   Sensorineural hearing loss (SNHL) of both ears 01/25/2020   Tinnitus 09/21/2019   Hot flashes 04/20/2019   Numerous moles 04/20/2019   Back pain 07/29/2017   Feeling of incomplete bladder emptying 02/27/2017   Prediabetes 09/29/2014   Bilateral ocular hypertension 06/27/2014   Cervical pain (neck) 05/05/2014   Chronic cough 12/09/2012   Seasonal allergies 09/23/2012   Bilateral knee pain 03/26/2012   GERD (gastroesophageal reflux disease) 11/15/2010   Carpal tunnel syndrome 06/13/2010   Callus of foot 06/07/2008   Hyperlipidemia 11/06/2006   Depression 11/06/2006    Objective:     BP 120/74 (BP Location: Left Arm, Patient Position: Sitting, Cuff Size: Normal)   Pulse 73   Temp 97.8 F (36.6 C) (Oral)   Ht '5\' 5"'$  (1.651 m)   Wt 205 lb 9.6 oz (93.3 kg)   LMP 09/14/2006   SpO2 99%   BMI 34.21 kg/m  BP Readings from Last 3 Encounters:  08/12/22 120/74  05/22/22 124/73  09/18/21 125/78   Wt Readings from Last 3 Encounters:  08/12/22 205 lb 9.6 oz (93.3 kg)  05/22/22 203 lb 8 oz (92.3 kg)  09/18/21 198 lb 14.4 oz (90.2 kg)     Physical Exam Vitals reviewed.  Constitutional:       Appearance: Normal appearance. She is obese. She is not ill-appearing.  Cardiovascular:     Rate and Rhythm: Normal rate and regular rhythm.     Comments: 2+ DP pulses Pulmonary:     Effort: Pulmonary effort is normal.  Musculoskeletal:        General: No swelling.     Comments: TTP over the 2-4 metatarsal heads, subcutaneous firmness most notable over the 2nd MT; TTP over lower lumbar spine; negative straight leg bilaterally; no muscle tenderness to palpation  Neurological:     General: No focal deficit present.     Mental Status: She is alert.     Motor: No weakness.     Gait: Gait normal.  Psychiatric:        Mood and Affect: Mood normal.        Behavior: Behavior normal.      Assessment & Plan:   Problem List Items Addressed This Visit       Digestive   GERD (gastroesophageal reflux disease)    Continues to have dysphagia with sensation of solids and liquids getting stuck in her throat. She has been taking pantoprazole as needed for reflux symptoms. Previously referred to GI, however I do not see that she was evaluated. I have resent a referral for evaluation of dysphagia, GERD, and need for screening colonoscopy.  Nervous and Auditory   Carpal tunnel syndrome    History of bilateral carpal tunnel and left ulnar nerve entrapment. S/p right carpal tunnel surgery. She is having some worsening left upper extremity symptoms and would like to follow-up with her hand surgeon, Dr. Fredna Dow. She will call his office to make an appointment.      Relevant Medications   gabapentin (NEURONTIN) 100 MG capsule   citalopram (CELEXA) 20 MG tablet   rOPINIRole (REQUIP) 0.5 MG tablet   Peripheral polyneuropathy    Patient describing bilateral lower extremity symptoms of numbness, tingling, and aching. Reports this has been an issue since at least 2008, intermittently worsens and now worse over the past month or so. Previously evaluated for leg cramping although symptoms are not  entirely consistent with this. No focal deficits of the legs. Negative straight leg bilaterally. Midline lower back pain, TTP. 2+ DP pulses, feet are warm. Pain over left plantar forefoot, described elsewhere. Patient does have RLS but additional symptoms as described are not confined to nighttime. Symptoms are worse at rest, not consistent with claudication. Suspicious for peripheral neuropathy, differential includes vitamin/mineral deficiency, diabetes, lumbar radiculopathy. Lower suspicion for radiculopathy based on examination, however I have ordered XR given midline tenderness. Will assess for these and continue w/u as needed. Previously on gabapentin for neuropathic pain which she found helpful.  Plan -Start gabapentin 100 mg tid -B12, iron, A1c -Lumbar spine XR      Relevant Medications   gabapentin (NEURONTIN) 100 MG capsule   citalopram (CELEXA) 20 MG tablet   rOPINIRole (REQUIP) 0.5 MG tablet   Other Relevant Orders   Vitamin B12   Hemoglobin A1c   DG Lumbar Spine Complete     Other   Hyperlipidemia    Elevated LDL on past lipid panels. Previously prescribed high-intensity statin, however Ms. Shoots has not been taking this as she has changed her diet and reports prior provider told her it was okay to stop taking this. We will recheck lipid panel today and discuss resuming statin if needed.  Plan -Lipid panel      Relevant Orders   Lipid Profile   Depression    PHQ9 of 16 today, up from 9 at last visit. Ms. Capoccia previously followed with [facility] but has not seen them in a while. She has been off medications for mood. We discussed resuming citalopram today to which she agreed. No SI/HI.  Plan -Start citalopram 20 mg daily -Pt to make an appointment with [facility]       Relevant Medications   citalopram (CELEXA) 20 MG tablet   Seasonal allergies    Using cetirizine as needed for seasonal allergies. Continue current regimen.      Prediabetes    History of prediabetes  with A1c 6.1 05/2021. Will check A1c today for progression.      Feeling of incomplete bladder emptying    We were not able to address this chronic issue today. Ms. Spelman notes she is no longer taking Myrbetriq. Previously followed with [facility].       Back pain    History of chronic lumbar back pain. Midline tenderness today over lower spine, no paraspinal tenderness. Straight leg negative bilaterally. With symptoms of lower extremity neuropathy, we will obtain updated lumbar XR to assess for significant bony changes.  Plan -Patient to return for lumbar XR      Restless leg syndrome    History of RLS, previously treated with ropinirole 0.5 mg qhs. She has  been off this for some time and notes nightly feeling of restless legs interrupting sleep.  Plan -Start ropinirole 0.5 mg qhs -Iron studies      Relevant Medications   rOPINIRole (REQUIP) 0.5 MG tablet   Other Relevant Orders   Iron, TIBC and Ferritin Panel   Screening mammogram for breast cancer    Due for annual mammogram. Ordered placed today.      Relevant Orders   MM Digital Screening   Foot pain, left - Primary    Two months of left forefoot pain over the 2-4 metatarsal heads, concerning for metatarsalgia vs Morton neuroma. Worst with walking. Ms. Lombardozzi has been taking Aleve as needed for pain which has helped some. Referral placed to podiatry for further management.      Relevant Orders   Ambulatory referral to Podiatry   Other Visit Diagnoses     Dysphagia, unspecified type       Relevant Orders   Ambulatory referral to Gastroenterology        Return in about 4 weeks (around 09/09/2022) for fu mood.    Charise Killian, MD

## 2022-08-12 NOTE — Assessment & Plan Note (Signed)
Two months of left forefoot pain over the 2-4 metatarsal heads, concerning for metatarsalgia vs Morton neuroma. Worst with walking. Ms. Aud has been taking Aleve as needed for pain which has helped some. Referral placed to podiatry for further management.

## 2022-08-12 NOTE — Assessment & Plan Note (Signed)
Due for annual mammogram. Ordered placed today.

## 2022-08-12 NOTE — Assessment & Plan Note (Signed)
PHQ9 of 16 today, up from 9 at last visit. Theresa Kirk previously followed with [facility] but has not seen them in a while. She has been off medications for mood. We discussed resuming citalopram today to which she agreed. No SI/HI.  Plan -Start citalopram 20 mg daily -Pt to make an appointment with [facility]

## 2022-08-12 NOTE — Patient Instructions (Addendum)
It was wonderful to see you today!  Please call [facility] to set up a follow-up appointment for mood. We have restarted your citalopram (Celexa) for mood symptoms.  I have placed a referral to the GI doctors for colonoscopy and discussion of the difficulty you are having with swallowing food and liquids. Continue taking pantoprazole for reflux symptoms.  We have started gabapentin 100 mg three times a day for nerve pain of your legs. This may make you a little drowsy so be careful to avoid tasks like driving or other things that require concentration until you know how it affects you. We have also restarted ropinirole (Requip) at bedtime for restless legs.  You will be called to set up an appointment with the foot doctor for your left foot pain. You may continue Aleve 2 tablets twice daily with food as needed for severe pain.  We are checking labs for blood sugar, cholesterol, and vitamins/iron. I will call you with results. I have put an order in for mammogram. Please come back to have the x-ray of your back completed.

## 2022-08-12 NOTE — Assessment & Plan Note (Signed)
History of chronic lumbar back pain. Midline tenderness today over lower spine, no paraspinal tenderness. Straight leg negative bilaterally. With symptoms of lower extremity neuropathy, we will obtain updated lumbar XR to assess for significant bony changes.  Plan -Patient to return for lumbar XR

## 2022-08-12 NOTE — Assessment & Plan Note (Signed)
Continues to have dysphagia with sensation of solids and liquids getting stuck in her throat. She has been taking pantoprazole as needed for reflux symptoms. Previously referred to GI, however I do not see that she was evaluated. I have resent a referral for evaluation of dysphagia, GERD, and need for screening colonoscopy.

## 2022-08-12 NOTE — Assessment & Plan Note (Addendum)
Patient describing bilateral lower extremity symptoms of numbness, tingling, and aching. Reports this has been an issue since at least 2008, intermittently worsens and now worse over the past month or so. Previously evaluated for leg cramping although symptoms are not entirely consistent with this. No focal deficits of the legs. Negative straight leg bilaterally. Midline lower back pain, TTP. 2+ DP pulses, feet are warm. Pain over left plantar forefoot, described elsewhere. Patient does have RLS but additional symptoms as described are not confined to nighttime. Symptoms are worse at rest, not consistent with claudication. Suspicious for peripheral neuropathy, differential includes vitamin/mineral deficiency, diabetes, lumbar radiculopathy. Lower suspicion for radiculopathy based on examination, however I have ordered XR given midline tenderness. Will assess for these and continue w/u as needed. Previously on gabapentin for neuropathic pain which she found helpful.  Plan -Start gabapentin 100 mg tid -B12, iron, A1c -Lumbar spine XR

## 2022-08-12 NOTE — Assessment & Plan Note (Signed)
We were not able to address this chronic issue today. Theresa Kirk notes she is no longer taking Myrbetriq. Previously followed with [facility].

## 2022-08-12 NOTE — Assessment & Plan Note (Signed)
History of bilateral carpal tunnel and left ulnar nerve entrapment. S/p right carpal tunnel surgery. She is having some worsening left upper extremity symptoms and would like to follow-up with her hand surgeon, Dr. Fredna Dow. She will call his office to make an appointment.

## 2022-08-14 LAB — HEMOGLOBIN A1C
Est. average glucose Bld gHb Est-mCnc: 140 mg/dL
Hgb A1c MFr Bld: 6.5 % — ABNORMAL HIGH (ref 4.8–5.6)

## 2022-08-14 LAB — LIPID PANEL
Chol/HDL Ratio: 3.9 ratio (ref 0.0–4.4)
Cholesterol, Total: 249 mg/dL — ABNORMAL HIGH (ref 100–199)
HDL: 64 mg/dL (ref >39)
LDL Chol Calc (NIH): 162 mg/dL — ABNORMAL HIGH (ref 0–99)
Triglycerides: 128 mg/dL (ref 0–149)
VLDL Cholesterol Cal: 23 mg/dL (ref 5–40)

## 2022-08-14 LAB — IRON,TIBC AND FERRITIN PANEL
Ferritin: 355 ng/mL — ABNORMAL HIGH (ref 15–150)
Iron Saturation: 26 % (ref 15–55)
Iron: 86 ug/dL (ref 27–159)
Total Iron Binding Capacity: 331 ug/dL (ref 250–450)
UIBC: 245 ug/dL (ref 131–425)

## 2022-08-14 LAB — VITAMIN B12: Vitamin B-12: 479 pg/mL (ref 232–1245)

## 2022-08-15 MED ORDER — ROSUVASTATIN CALCIUM 20 MG PO TABS: ORAL_TABLET | ORAL | 3 refills | Status: DC

## 2022-08-15 NOTE — Progress Notes (Signed)
Normal B12

## 2022-08-15 NOTE — Addendum Note (Signed)
Addended by: Charise Killian on: 08/15/2022 08:45 AM   Modules accepted: Orders

## 2022-08-15 NOTE — Progress Notes (Signed)
Iron studies not consistent with iron deficiency.

## 2022-08-15 NOTE — Progress Notes (Signed)
A1c now in diabetes range. Will repeat at one month f/u to confirm. Will not start any medications yet. LDL elevated off statin therapy. Will resume rosuvastatin. I have discussed these results and plan with Ms. Shuping 08/15/22.

## 2022-08-27 ENCOUNTER — Ambulatory Visit (INDEPENDENT_AMBULATORY_CARE_PROVIDER_SITE_OTHER): Payer: 59 | Admitting: Podiatry

## 2022-08-27 ENCOUNTER — Ambulatory Visit: Payer: 59

## 2022-08-27 DIAGNOSIS — M7752 Other enthesopathy of left foot: Secondary | ICD-10-CM | POA: Diagnosis not present

## 2022-08-27 DIAGNOSIS — M79672 Pain in left foot: Secondary | ICD-10-CM

## 2022-08-27 DIAGNOSIS — R52 Pain, unspecified: Secondary | ICD-10-CM | POA: Diagnosis not present

## 2022-08-27 MED ORDER — BETAMETHASONE SOD PHOS & ACET 6 (3-3) MG/ML IJ SUSP
3.0000 mg | Freq: Once | INTRAMUSCULAR | Status: AC
  Administered 2022-08-27: 3 mg via INTRA_ARTICULAR

## 2022-08-27 MED ORDER — MELOXICAM 15 MG PO TABS: 15.0000 mg | ORAL_TABLET | Freq: Every day | ORAL | 1 refills | Status: DC

## 2022-08-27 NOTE — Progress Notes (Signed)
   Chief Complaint  Patient presents with   Foot Pain    Patient came in today for left foot forefoot pain, started 2 months ago, rate of pain 7 out of 10, throbbing, X-Rays done today    HPI: 62 y.o. female presenting today for new complaint of pain and tenderness associated to the left forefoot that has been ongoing for about 2 months now.  Denies a history of injury.  She has not done anything for treatment.  Past Medical History:  Diagnosis Date   Abnormal mammogram with microcalcification 10/15/2013   Acquired hallux limitus of both feet 10/15/2019   Acute pain of right wrist 02/03/2018   Alcohol abuse    Arthritis    Asthma    Callus of foot 06/07/2008   Qualifier: Diagnosis of  By: Eyvonne Mechanic MD, Vijay     Carpal tunnel syndrome    Cervical lymphadenopathy 09/21/2019   Cervical muscle pain 05/05/2014   - Pt with cervical pain radiating down her neck with no signs of radiculopathy or weakness in upper extremities   Depression    Fibromyalgia    Healthcare maintenance 04/13/2012   High cholesterol    Irritation symptom of skin 12/30/2017    Past Surgical History:  Procedure Laterality Date   ABDOMINAL HYSTERECTOMY  2008   h/o uterine fibroids, ?partial hysterectomy per pt 04/13/12   CARPAL TUNNEL RELEASE Right 01/05/2021   Procedure: RIGHT CARPAL TUNNEL RELEASE;  Surgeon: [name], MD;  Location: [facility];  Service: Orthopedics;  Laterality: Right;  30 MIN   COLONOSCOPY     FOOT SURGERY Right    KNEE SURGERY      Allergies  Allergen Reactions   Iohexol Anaphylaxis    24 hr pre meds (did fine) Swelling    Ibuprofen Nausea Only     Physical Exam: General: The patient is alert and oriented x3 in no acute distress.  Dermatology: Skin is warm, dry and supple bilateral lower extremities. Negative for open lesions or macerations.  Vascular: Palpable pedal pulses bilaterally. Capillary refill within normal limits.  Negative for any significant edema  or erythema  Neurological: Light touch and protective threshold grossly intact  Musculoskeletal Exam: No pedal deformities noted.  There is pain on palpation range of motion specifically to the second MTP of the left foot  Radiographic Exam LT foot 08/27/2022:  Normal osseous mineralization. Joint spaces preserved. No fracture/dislocation/boney destruction.    Assessment: 1.  Second MTP capsulitis left  -Patient evaluated.  X-rays reviewed -Injection of 0.5 cc Celestone Soluspan injection of the second MTP left -Prescription for meloxicam 15 mg daily as needed -Advised against going barefoot.  Recommend good supportive shoes and sneakers -Return to clinic as needed      Edrick Kins, DPM [facility]  Dr. Edrick Kins, DPM    [address], [place] [address]                Office [phone]  Fax [phone]

## 2022-09-09 ENCOUNTER — Other Ambulatory Visit: Payer: Self-pay

## 2022-09-09 DIAGNOSIS — G629 Polyneuropathy, unspecified: Secondary | ICD-10-CM

## 2022-09-09 MED ORDER — GABAPENTIN 100 MG PO CAPS: 100.0000 mg | ORAL_CAPSULE | Freq: Three times a day (TID) | ORAL | 0 refills | Status: DC

## 2022-09-11 ENCOUNTER — Encounter: Payer: 59 | Admitting: Student

## 2022-09-25 ENCOUNTER — Ambulatory Visit
Admission: RE | Admit: 2022-09-25 | Discharge: 2022-09-25 | Disposition: A | Discharge status: Home / Self Care | Payer: 59 | Source: Ambulatory Visit | Attending: Internal Medicine | Admitting: Internal Medicine

## 2022-09-25 DIAGNOSIS — Z1231 Encounter for screening mammogram for malignant neoplasm of breast: Secondary | ICD-10-CM | POA: Diagnosis not present

## 2022-11-08 ENCOUNTER — Other Ambulatory Visit: Payer: Self-pay | Admitting: Internal Medicine

## 2022-11-08 DIAGNOSIS — G2581 Restless legs syndrome: Secondary | ICD-10-CM

## 2022-12-18 ENCOUNTER — Ambulatory Visit (INDEPENDENT_AMBULATORY_CARE_PROVIDER_SITE_OTHER): Payer: 59

## 2022-12-18 VITALS — BP 125/74 | HR 75 | Ht 64.5 in | Wt 202.1 lb

## 2022-12-18 DIAGNOSIS — Z0000 Encounter for general adult medical examination without abnormal findings: Secondary | ICD-10-CM | POA: Diagnosis not present

## 2022-12-18 DIAGNOSIS — Z1211 Encounter for screening for malignant neoplasm of colon: Secondary | ICD-10-CM

## 2022-12-18 NOTE — Progress Notes (Signed)
Subjective:   Theresa Kirk is a 62 y.o. female who presents for Medicare Annual (Subsequent) preventive examination.  Visit Complete: In person    Review of Systems    Cardiac Risk Factors include: advanced age (>48men, >101 women);dyslipidemia;obesity (BMI >30kg/m2)     Objective:    Today's Vitals   12/18/22 1022 12/18/22 1026  BP: 125/74   Pulse: 75   SpO2: 99%   Weight: 202 lb 1.6 oz (91.7 kg)   Height: 5' 4.5" (1.638 m)   PainSc:  7    Body mass index is 34.15 kg/m.     12/18/2022   10:40 AM 05/22/2022    9:20 AM 10/11/2021    4:35 PM 09/18/2021    8:44 AM 05/15/2021    9:13 AM 01/05/2021   12:26 PM 12/25/2020   11:46 AM  Advanced Directives  Does Patient Have a Medical Advance Directive? No No No No No No No  Would patient like information on creating a medical advance directive? No - Patient declined No - Patient declined No - Patient declined No - Patient declined No - Patient declined No - Patient declined No - Patient declined    Current Medications (verified) Outpatient Encounter Medications as of 12/18/2022  Medication Sig   Cetirizine HCl 10 MG CAPS Take 1 capsule (10 mg total) by mouth daily.   gabapentin (NEURONTIN) 100 MG capsule Take 1 capsule (100 mg total) by mouth 3 (three) times daily.   pantoprazole (PROTONIX) 40 MG tablet TAKE 1 TABLET BY MOUTH EVERY DAY(TAKE 30 MINS BEFORE MEALS)   albuterol (VENTOLIN HFA) 108 (90 Base) MCG/ACT inhaler Inhale 1-2 puffs into the lungs every 6 (six) hours as needed for wheezing or shortness of breath. (Patient not taking: Reported on 12/18/2022)   citalopram (CELEXA) 20 MG tablet TAKE 1 TABLET(20 MG) BY MOUTH DAILY (Patient not taking: Reported on 12/18/2022)   meloxicam (MOBIC) 15 MG tablet Take 1 tablet (15 mg total) by mouth daily. (Patient not taking: Reported on 12/18/2022)   rOPINIRole (REQUIP) 0.5 MG tablet TAKE 1 TABLET(0.5 MG) BY MOUTH AT BEDTIME (Patient not taking: Reported on 12/18/2022)   rosuvastatin  (CRESTOR) 20 MG tablet TAKE 1 TABLET(20 MG) BY MOUTH DAILY (Patient not taking: Reported on 12/18/2022)   No facility-administered encounter medications on file as of 12/18/2022.    Allergies (verified) Iohexol and Ibuprofen   History: Past Medical History:  Diagnosis Date   Abnormal mammogram with microcalcification 10/15/2013   Acquired hallux limitus of both feet 10/15/2019   Acute pain of right wrist 02/03/2018   Alcohol abuse    Arthritis    Asthma    Callus of foot 06/07/2008   Qualifier: Diagnosis of  By: Comer Locket MD, Vijay     Carpal tunnel syndrome    Cervical lymphadenopathy 09/21/2019   Cervical muscle pain 05/05/2014   - Pt with cervical pain radiating down her neck with no signs of radiculopathy or weakness in upper extremities   Depression    Fibromyalgia    Healthcare maintenance 04/13/2012   High cholesterol    Irritation symptom of skin 12/30/2017   Past Surgical History:  Procedure Laterality Date   ABDOMINAL HYSTERECTOMY  2008   h/o uterine fibroids, ?partial hysterectomy per pt 04/13/12   CARPAL TUNNEL RELEASE Right 01/05/2021   Procedure: RIGHT CARPAL TUNNEL RELEASE;  Surgeon: Betha Loa, MD;  Location: [facility];  Service: Orthopedics;  Laterality: Right;  30 MIN   COLONOSCOPY     FOOT  SURGERY Right    KNEE SURGERY     Family History  Problem Relation Age of Onset   Diabetes Mother    Hypertension Mother    Stroke Mother    Diabetes Sister    Thyroid disease Sister    Hypertension Sister    Cancer Maternal Aunt        unknown   Breast cancer Maternal Aunt    Social History   Socioeconomic History   Marital status: Single    Spouse name: Not on file   Number of children: Not on file   Years of education: Not on file   Highest education level: Not on file  Occupational History   Not on file  Tobacco Use   Smoking status: Former    Current packs/day: 0.00    Types: Cigarettes    Quit date: 06/03/2000    Years since  quitting: 22.5   Smokeless tobacco: Never  Vaping Use   Vaping status: Never Used  Substance and Sexual Activity   Alcohol use: Not Currently    Alcohol/week: 1.0 - 2.0 standard drink of alcohol    Types: 1 - 2 Cans of beer per week    Comment: former heavy drinker; but not current   Drug use: Yes    Types: Marijuana    Comment: 01/04/2021   Sexual activity: Not on file  Other Topics Concern   Not on file  Social History Narrative   Lives with mother in [place].   Currently unemployed.   Used to work at [employer] in [place].   ETOH use: weekend, 1-2 beers from [employer]: used to drink everyday   Former smoker   Current THC use   H/o cocaine use: quit in 2001.   Social Determinants of Health   Financial Resource Strain: Low Risk  (12/18/2022)   Overall Financial Resource Strain (CARDIA)    Difficulty of Paying Living Expenses: Not very hard  Food Insecurity: Food Insecurity Present (12/18/2022)   Hunger Vital Sign    Worried About Running Out of Food in the Last Year: Sometimes true    Ran Out of Food in the Last Year: Never true  Transportation Needs: No Transportation Needs (12/18/2022)   PRAPARE - Administrator, Civil Service (Medical): No    Lack of Transportation (Non-Medical): No  Physical Activity: Insufficiently Active (12/18/2022)   Exercise Vital Sign    Days of Exercise per Week: 2 days    Minutes of Exercise per Session: 10 min  Stress: Stress Concern Present (12/18/2022)   [employer] of Occupational Health - Occupational Stress Questionnaire    Feeling of Stress : Very much  Social Connections: Moderately Integrated (12/18/2022)   Social Connection and Isolation Panel [NHANES]    Frequency of Communication with Friends and Family: Twice a week    Frequency of Social Gatherings with Friends and Family: Twice a week    Attends Religious Services: 1 to 4 times per year    Active Member of [employer] or Organizations: No    Attends Hospital doctor: Never    Marital Status: Married    Tobacco Counseling Counseling given: Not Answered   Clinical Intake:  Pre-visit preparation completed: Yes  Pain : 0-10 Pain Score: 7  (Rt leg and lt arm) Pain Type: Acute pain Pain Location: Leg Pain Orientation: Right Pain Radiating Towards: Per patient, pain switches out from lt leg to right leg Pain Descriptors / Indicators: Dull, Discomfort, Constant Pain Onset:  More than a month ago Pain Frequency: Intermittent Pain Relieving Factors: Gabapentin 100 mg (Not helping much.) Effect of Pain on Daily Activities: Effects walking sometimes has to use a cane  Pain Relieving Factors: Gabapentin 100 mg (Not helping much.)  BMI - recorded: 34.15 Nutritional Status: BMI > 30  Obese Nutritional Risks: None Diabetes: No  How often do you need to have someone help you when you read instructions, pamphlets, or other written materials from your doctor or pharmacy?: 1 - Never  Interpreter Needed?: No  Information entered by :: Halona Amstutz, RMA   Activities of Daily Living    12/18/2022   10:33 AM 08/12/2022    8:34 AM  In your present state of health, do you have any difficulty performing the following activities:  Hearing? 0 0  Vision? 1 0  Comment Both eyes have been jumping-per patient   Difficulty concentrating or making decisions? 1 1  Comment per patient-focusing   Walking or climbing stairs? 1 1  Comment  foot pain  Dressing or bathing? 0 0  Doing errands, shopping? 0 0  Preparing Food and eating ? N   Using the Toilet? N   In the past six months, have you accidently leaked urine? N   Do you have problems with loss of bowel control? N   Managing your Medications? N   Managing your Finances? N   Housekeeping or managing your Housekeeping? N     Patient Care Team: Dickie La, MD as PCP - General (Internal Medicine)  Indicate any recent Medical Services you may have received from other than [facility] providers in the  past year (date may be approximate).     Assessment:   This is a routine wellness examination for Kezia.  Hearing/Vision screen Hearing Screening - Comments:: Denies hearing difficulties    Dietary issues and exercise activities discussed:     Goals Addressed   None   Depression Screen    12/18/2022   10:51 AM 08/12/2022    9:05 AM 05/22/2022    9:20 AM 09/18/2021    8:51 AM 08/24/2021    2:06 PM 05/15/2021    9:22 AM 08/14/2020    8:56 AM  PHQ 2/9 Scores  PHQ - 2 Score 1 5 1  0 2 0 0  PHQ- 9 Score 9 16 9  0 11 0 0    Fall Risk    12/18/2022   10:41 AM 08/12/2022    8:34 AM 05/22/2022    9:09 AM 09/18/2021    8:40 AM 08/24/2021    2:01 PM  Fall Risk   Falls in the past year? 0 0 0 0 0  Number falls in past yr: 0 0 0 0 0  Injury with Fall? 0 0 0 0 0  Risk for fall due to : No Fall Risks No Fall Risks No Fall Risks  No Fall Risks  Follow up Falls prevention discussed Falls evaluation completed Falls evaluation completed Falls evaluation completed     MEDICARE RISK AT HOME:  Medicare Risk at Home - 12/18/22 1041     Any stairs in or around the home? No    Home free of loose throw rugs in walkways, pet beds, electrical cords, etc? Yes    Adequate lighting in your home to reduce risk of falls? Yes    Life alert? No    Use of a cane, walker or w/c? Yes   Cane sometimes.   Grab bars in the bathroom? No  Shower chair or bench in shower? Yes    Elevated toilet seat or a handicapped toilet? No             TIMED UP AND GO:  Was the test performed?  Yes  Length of time to ambulate 10 feet: 10 sec Gait slow and steady without use of assistive device    Cognitive Function:        12/18/2022   10:41 AM 08/24/2021    3:03 PM  6CIT Screen  What Year? 0 points 0 points  What month? 0 points 0 points  What time? 0 points 0 points  Count back from 20 0 points 0 points  Months in reverse 2 points 0 points  Repeat phrase 8 points 0 points  Total Score 10 points 0  points    Immunizations Immunization History  Administered Date(s) Administered   Influenza Split 03/07/2011, 04/13/2012   Influenza Whole 04/02/2007, 03/16/2008, 01/30/2010   Influenza,inj,Quad PF,6+ Mos 05/05/2014, 03/03/2015, 05/21/2016, 02/27/2017, 02/03/2018, 02/16/2019   PFIZER(Purple Top)SARS-COV-2 Vaccination 09/02/2019, 09/28/2019   Td 04/30/2010   Tdap 05/15/2021    TDAP status: Up to date  Flu Vaccine status: Due, Education has been provided regarding the importance of this vaccine. Advised may receive this vaccine at local pharmacy or Health Dept. Aware to provide a copy of the vaccination record if obtained from local pharmacy or Health Dept. Verbalized acceptance and understanding.  Pneumococcal vaccine status: Due, Education has been provided regarding the importance of this vaccine. Advised may receive this vaccine at local pharmacy or Health Dept. Aware to provide a copy of the vaccination record if obtained from local pharmacy or Health Dept. Verbalized acceptance and understanding.  Covid-19 vaccine status: Completed vaccines  Qualifies for Shingles Vaccine? Yes   Zostavax completed No   Shingrix Completed?: No.    Education has been provided regarding the importance of this vaccine. Patient has been advised to call insurance company to determine out of pocket expense if they have not yet received this vaccine. Advised may also receive vaccine at local pharmacy or Health Dept. Verbalized acceptance and understanding.  Screening Tests Health Maintenance  Topic Date Due   Zoster Vaccines- Shingrix (1 of 2) Never done   COVID-19 Vaccine (3 - Pfizer risk series) 10/26/2019   Colonoscopy  04/16/2022   INFLUENZA VACCINE  01/02/2023   Medicare Annual Wellness (AWV)  12/18/2023   MAMMOGRAM  09/24/2024   PAP SMEAR-Modifier  09/19/2026   DTaP/Tdap/Td (3 - Td or Tdap) 05/16/2031   Hepatitis C Screening  Completed   HIV Screening  Completed   HPV VACCINES  Aged Out     Health Maintenance  Health Maintenance Due  Topic Date Due   Zoster Vaccines- Shingrix (1 of 2) Never done   COVID-19 Vaccine (3 - Pfizer risk series) 10/26/2019   Colonoscopy  04/16/2022    Colorectal cancer screening: Referral to GI placed 12/18/2022. Pt aware the office will call re: appt.  Mammogram status: Completed 09/25/2022. Repeat every year   Lung Cancer Screening: (Low Dose CT Chest recommended if Age 72-80 years, 20 pack-year currently smoking OR have quit w/in 15years.) does not qualify.   Lung Cancer Screening Referral: N/a  Additional Screening:  Hepatitis C Screening: does qualify; Completed 02/27/2017  Vision Screening: Recommended annual ophthalmology exams for early detection of glaucoma and other disorders of the eye. Is the patient up to date with their annual eye exam?  No  Who is the provider or what is the  name of the office in which the patient attends annual eye exams? Patient is looking to herself for eye care provider. If pt is not established with a provider, would they like to be referred to a provider to establish care? Yes .   Dental Screening: Recommended annual dental exams for proper oral hygiene   Community Resource Referral / Chronic Care Management: CRR required this visit?  No   CCM required this visit?  No     Plan:     I have personally reviewed and noted the following in the patient's chart:   Medical and social history Use of alcohol, tobacco or illicit drugs  Current medications and supplements including opioid prescriptions. Patient is not currently taking opioid prescriptions. Functional ability and status Nutritional status Physical activity Advanced directives List of other physicians Hospitalizations, surgeries, and ER visits in previous 12 months Vitals Screenings to include cognitive, depression, and falls Referrals and appointments  In addition, I have reviewed and discussed with patient certain preventive  protocols, quality metrics, and best practice recommendations. A written personalized care plan for preventive services as well as general preventive health recommendations were provided to patient.     Klinton Candelas L Elham Fini, CMA   12/18/2022   After Visit Summary: (Mail) Due to this being a telephonic visit, the after visit summary with patients personalized plan was offered to patient via mail Patient was seen in office today but still would like her AVS to be mailed to her.  Nurse Notes: Patient need a referral for a colonoscopy, as it was over due in 2023.  Patient also would like to establish eye care.  She said that she had an eye doctor in mind and will check on it today.

## 2022-12-18 NOTE — Patient Instructions (Signed)
[name] , Thank you for taking time to come for your Medicare Wellness Visit. I appreciate your ongoing commitment to your health goals. Please review the following plan we discussed and let me know if I can assist you in the future.   These are the goals we discussed:  Goals   None     This is a list of the screening recommended for you and due dates:  Health Maintenance  Topic Date Due   Zoster (Shingles) Vaccine (1 of 2) Never done   COVID-19 Vaccine (3 - Pfizer risk series) 10/26/2019   Colon Cancer Screening  04/16/2022   Flu Shot  01/02/2023   Medicare Annual Wellness Visit  12/18/2023   Mammogram  09/24/2024   Pap Smear  09/19/2026   DTaP/Tdap/Td vaccine (3 - Td or Tdap) 05/16/2031   Hepatitis C Screening  Completed   HIV Screening  Completed   HPV Vaccine  Aged Out    Advanced directives: Advance directive discussed with you today. Even though you declined this today, please call our office should you change your mind, and we can give you the proper paperwork for you to fill out.   Conditions/risks identified: Thank you for coming in today. Remember to get updated Flu, Shingrix and Pneumonia  vaccines documentation from your pharmacy so we can update in your chart.  Aim for 30 minutes of exercise or brisk walking, 6-8 glasses of water, and 5 servings of fruits and vegetables each day.   Next appointment: Follow up in one year for your annual wellness visit.   Preventive Care 40-64 Years, Female Preventive care refers to lifestyle choices and visits with your health care provider that can promote health and wellness. What does preventive care include? A yearly physical exam. This is also called an annual well check. Dental exams once or twice a year. Routine eye exams. Ask your health care provider how often you should have your eyes checked. Personal lifestyle choices, including: Daily care of your teeth and gums. Regular physical activity. Eating a healthy  diet. Avoiding tobacco and drug use. Limiting alcohol use. Practicing safe sex. Taking low-dose aspirin daily starting at age 35. Taking vitamin and mineral supplements as recommended by your health care provider. What happens during an annual well check? The services and screenings done by your health care provider during your annual well check will depend on your age, overall health, lifestyle risk factors, and family history of disease. Counseling  Your health care provider may ask you questions about your: Alcohol use. Tobacco use. Drug use. Emotional well-being. Home and relationship well-being. Sexual activity. Eating habits. Work and work Astronomer. Method of birth control. Menstrual cycle. Pregnancy history. Screening  You may have the following tests or measurements: Height, weight, and BMI. Blood pressure. Lipid and cholesterol levels. These may be checked every 5 years, or more frequently if you are over 26 years old. Skin check. Lung cancer screening. You may have this screening every year starting at age 64 if you have a 30-pack-year history of smoking and currently smoke or have quit within the past 15 years. Fecal occult blood test (FOBT) of the stool. You may have this test every year starting at age 36. Flexible sigmoidoscopy or colonoscopy. You may have a sigmoidoscopy every 5 years or a colonoscopy every 10 years starting at age 71. Hepatitis C blood test. Hepatitis B blood test. Sexually transmitted disease (STD) testing. Diabetes screening. This is done by checking your blood sugar (glucose) after  you have not eaten for a while (fasting). You may have this done every 1-3 years. Mammogram. This may be done every 1-2 years. Talk to your health care provider about when you should start having regular mammograms. This may depend on whether you have a family history of breast cancer. BRCA-related cancer screening. This may be done if you have a family history of  breast, ovarian, tubal, or peritoneal cancers. Pelvic exam and Pap test. This may be done every 3 years starting at age 79. Starting at age 30, this may be done every 5 years if you have a Pap test in combination with an HPV test. Bone density scan. This is done to screen for osteoporosis. You may have this scan if you are at high risk for osteoporosis. Discuss your test results, treatment options, and if necessary, the need for more tests with your health care provider. Vaccines  Your health care provider may recommend certain vaccines, such as: Influenza vaccine. This is recommended every year. Tetanus, diphtheria, and acellular pertussis (Tdap, Td) vaccine. You may need a Td booster every 10 years. Zoster vaccine. You may need this after age 39. Pneumococcal 13-valent conjugate (PCV13) vaccine. You may need this if you have certain conditions and were not previously vaccinated. Pneumococcal polysaccharide (PPSV23) vaccine. You may need one or two doses if you smoke cigarettes or if you have certain conditions. Talk to your health care provider about which screenings and vaccines you need and how often you need them. This information is not intended to replace advice given to you by your health care provider. Make sure you discuss any questions you have with your health care provider. Document Released: 06/16/2015 Document Revised: 02/07/2016 Document Reviewed: 03/21/2015 Elsevier Interactive Patient Education  2017 [employer].    Fall Prevention in the Home Falls can cause injuries. They can happen to people of all ages. There are many things you can do to make your home safe and to help prevent falls. What can I do on the outside of my home? Regularly fix the edges of walkways and driveways and fix any cracks. Remove anything that might make you trip as you walk through a door, such as a raised step or threshold. Trim any bushes or trees on the path to your home. Use bright outdoor  lighting. Clear any walking paths of anything that might make someone trip, such as rocks or tools. Regularly check to see if handrails are loose or broken. Make sure that both sides of any steps have handrails. Any raised decks and porches should have guardrails on the edges. Have any leaves, snow, or ice cleared regularly. Use sand or salt on walking paths during winter. Clean up any spills in your garage right away. This includes oil or grease spills. What can I do in the bathroom? Use night lights. Install grab bars by the toilet and in the tub and shower. Do not use towel bars as grab bars. Use non-skid mats or decals in the tub or shower. If you need to sit down in the shower, use a plastic, non-slip stool. Keep the floor dry. Clean up any water that spills on the floor as soon as it happens. Remove soap buildup in the tub or shower regularly. Attach bath mats securely with double-sided non-slip rug tape. Do not have throw rugs and other things on the floor that can make you trip. What can I do in the bedroom? Use night lights. Make sure that you have a light  by your bed that is easy to reach. Do not use any sheets or blankets that are too big for your bed. They should not hang down onto the floor. Have a firm chair that has side arms. You can use this for support while you get dressed. Do not have throw rugs and other things on the floor that can make you trip. What can I do in the kitchen? Clean up any spills right away. Avoid walking on wet floors. Keep items that you use a lot in easy-to-reach places. If you need to reach something above you, use a strong step stool that has a grab bar. Keep electrical cords out of the way. Do not use floor polish or wax that makes floors slippery. If you must use wax, use non-skid floor wax. Do not have throw rugs and other things on the floor that can make you trip. What can I do with my stairs? Do not leave any items on the stairs. Make  sure that there are handrails on both sides of the stairs and use them. Fix handrails that are broken or loose. Make sure that handrails are as long as the stairways. Check any carpeting to make sure that it is firmly attached to the stairs. Fix any carpet that is loose or worn. Avoid having throw rugs at the top or bottom of the stairs. If you do have throw rugs, attach them to the floor with carpet tape. Make sure that you have a light switch at the top of the stairs and the bottom of the stairs. If you do not have them, ask someone to add them for you. What else can I do to help prevent falls? Wear shoes that: Do not have high heels. Have rubber bottoms. Are comfortable and fit you well. Are closed at the toe. Do not wear sandals. If you use a stepladder: Make sure that it is fully opened. Do not climb a closed stepladder. Make sure that both sides of the stepladder are locked into place. Ask someone to hold it for you, if possible. Clearly mark and make sure that you can see: Any grab bars or handrails. First and last steps. Where the edge of each step is. Use tools that help you move around (mobility aids) if they are needed. These include: Canes. Walkers. Scooters. Crutches. Turn on the lights when you go into a dark area. Replace any light bulbs as soon as they burn out. Set up your furniture so you have a clear path. Avoid moving your furniture around. If any of your floors are uneven, fix them. If there are any pets around you, be aware of where they are. Review your medicines with your doctor. Some medicines can make you feel dizzy. This can increase your chance of falling. Ask your doctor what other things that you can do to help prevent falls. This information is not intended to replace advice given to you by your health care provider. Make sure you discuss any questions you have with your health care provider. Document Released: 03/16/2009 Document Revised: 10/26/2015  Document Reviewed: 06/24/2014 Elsevier Interactive Patient Education  2017 [employer].

## 2023-02-06 ENCOUNTER — Other Ambulatory Visit: Payer: Self-pay | Admitting: Internal Medicine

## 2023-02-06 DIAGNOSIS — G2581 Restless legs syndrome: Secondary | ICD-10-CM

## 2023-03-04 ENCOUNTER — Encounter: Payer: Self-pay | Admitting: Internal Medicine

## 2023-03-04 ENCOUNTER — Ambulatory Visit: Payer: 59 | Admitting: Internal Medicine

## 2023-03-04 ENCOUNTER — Telehealth: Payer: Self-pay | Admitting: *Deleted

## 2023-03-04 ENCOUNTER — Other Ambulatory Visit: Payer: Self-pay

## 2023-03-04 VITALS — BP 117/66 | HR 80 | Temp 98.3°F | Ht 65.0 in | Wt 200.3 lb

## 2023-03-04 DIAGNOSIS — G2581 Restless legs syndrome: Secondary | ICD-10-CM

## 2023-03-04 DIAGNOSIS — F332 Major depressive disorder, recurrent severe without psychotic features: Secondary | ICD-10-CM | POA: Diagnosis not present

## 2023-03-04 DIAGNOSIS — E785 Hyperlipidemia, unspecified: Secondary | ICD-10-CM

## 2023-03-04 DIAGNOSIS — Z23 Encounter for immunization: Secondary | ICD-10-CM

## 2023-03-04 DIAGNOSIS — R1310 Dysphagia, unspecified: Secondary | ICD-10-CM

## 2023-03-04 DIAGNOSIS — Z0000 Encounter for general adult medical examination without abnormal findings: Secondary | ICD-10-CM

## 2023-03-04 DIAGNOSIS — K219 Gastro-esophageal reflux disease without esophagitis: Secondary | ICD-10-CM | POA: Diagnosis not present

## 2023-03-04 DIAGNOSIS — R7303 Prediabetes: Secondary | ICD-10-CM

## 2023-03-04 DIAGNOSIS — H2513 Age-related nuclear cataract, bilateral: Secondary | ICD-10-CM | POA: Diagnosis not present

## 2023-03-04 DIAGNOSIS — H538 Other visual disturbances: Secondary | ICD-10-CM

## 2023-03-04 LAB — POCT GLYCOSYLATED HEMOGLOBIN (HGB A1C): Hemoglobin A1C: 6.3 % — AB (ref 4.0–5.6)

## 2023-03-04 LAB — GLUCOSE, CAPILLARY: Glucose-Capillary: 123 mg/dL — ABNORMAL HIGH (ref 70–99)

## 2023-03-04 MED ORDER — ROSUVASTATIN CALCIUM 20 MG PO TABS
ORAL_TABLET | ORAL | 3 refills | Status: DC
Start: 2023-03-04 — End: 2023-03-05

## 2023-03-04 MED ORDER — SERTRALINE HCL 25 MG PO TABS: 25.0000 mg | ORAL_TABLET | Freq: Every day | ORAL | 1 refills | Status: DC

## 2023-03-04 NOTE — Assessment & Plan Note (Addendum)
Due for colonoscopy, referral provided. A polyp has been found in the past.  Due for pap in 2028

## 2023-03-04 NOTE — Assessment & Plan Note (Signed)
Pt has consistently been in the pre diabetic range for 5+ years. A1c is 6.3 today down from 6.5 in 08/12/2022. Interested in nutrition and diabetes program which is covered by her insurance.  Assessment/Plan  - Pre diabetic  - Refer to [facility] Diabetes Prevention Program

## 2023-03-04 NOTE — Progress Notes (Unsigned)
Subjective:   Patient ID: Theresa Kirk female   DOB: Sep 30, 1960 62 y.o.   MRN: [id number]  HPI: Theresa Kirk is a 62 y.o. female with PMH of GERD, hyperlipidemia, and depression presenting to clinic for routine follow up. Theresa Gibbon reports the only medication she is currently taking is pantoprazole. She has not been taking her gabapentin in 2 weeks, she feels it causes her muscle cramping and her polyneuropathy has not bothered her to do everyday activities for now.  She reports she does not like being on a lot of medications but is struggling with her mood. She has some socioeconomic barriers in seeing other specialists, medication adherence, and paying co-pays.  She has been referred to community care coordination to manage this. Please see problem list assessment and plan for details of the visit.    Past Medical History:  Diagnosis Date   Abnormal mammogram with microcalcification 10/15/2013   Acquired hallux limitus of both feet 10/15/2019   Acute pain of right wrist 02/03/2018   Alcohol abuse    Arthritis    Asthma    Callus of foot 06/07/2008   Qualifier: Diagnosis of  By: Comer Locket MD, Vijay     Carpal tunnel syndrome    Cervical lymphadenopathy 09/21/2019   Cervical muscle pain 05/05/2014   - Pt with cervical pain radiating down her neck with no signs of radiculopathy or weakness in upper extremities   Depression    Fibromyalgia    Healthcare maintenance 04/13/2012   High cholesterol    Irritation symptom of skin 12/30/2017   Current Outpatient Medications  Medication Sig Dispense Refill   sertraline (ZOLOFT) 25 MG tablet Take 1 tablet (25 mg total) by mouth daily. 30 tablet 1   albuterol (VENTOLIN HFA) 108 (90 Base) MCG/ACT inhaler Inhale 1-2 puffs into the lungs every 6 (six) hours as needed for wheezing or shortness of breath. (Patient not taking: Reported on 12/18/2022) 18 g 0   Cetirizine HCl 10 MG CAPS Take 1 capsule (10 mg total) by mouth daily. 60 capsule 0    gabapentin (NEURONTIN) 100 MG capsule Take 1 capsule (100 mg total) by mouth 3 (three) times daily. 90 capsule 0   meloxicam (MOBIC) 15 MG tablet Take 1 tablet (15 mg total) by mouth daily. (Patient not taking: Reported on 12/18/2022) 30 tablet 1   pantoprazole (PROTONIX) 40 MG tablet TAKE 1 TABLET BY MOUTH EVERY DAY(TAKE 30 MINS BEFORE MEALS) 90 tablet 1   rosuvastatin (CRESTOR) 20 MG tablet TAKE 1 TABLET(20 MG) BY MOUTH DAILY 90 tablet 3   No current facility-administered medications for this visit.   Family History  Problem Relation Age of Onset   Diabetes Mother    Hypertension Mother    Stroke Mother    Diabetes Sister    Thyroid disease Sister    Hypertension Sister    Cancer Maternal Aunt        unknown   Breast cancer Maternal Aunt    Social History   Socioeconomic History   Marital status: Single    Spouse name: Not on file   Number of children: Not on file   Years of education: Not on file   Highest education level: Not on file  Occupational History   Not on file  Tobacco Use   Smoking status: Former    Current packs/day: 0.00    Types: Cigarettes    Quit date: 06/03/2000    Years since quitting: 22.7   Smokeless tobacco: Never  Vaping  Use   Vaping status: Never Used  Substance and Sexual Activity   Alcohol use: Not Currently   Drug use: Yes    Types: Marijuana   Sexual activity: Not on file  Other Topics Concern   Not on file  Social History Narrative   Lives with mother in [place].   Currently unemployed.   Used to work at [employer] in [place].   ETOH use: weekend, 1-2 beers from [employer]: used to drink everyday   Former smoker   Current THC use   H/o cocaine use: quit in 2001.   Social Determinants of Health   Financial Resource Strain: Low Risk  (12/18/2022)   Overall Financial Resource Strain (CARDIA)    Difficulty of Paying Living Expenses: Not very hard  Food Insecurity: Food Insecurity Present (12/18/2022)   Hunger Vital Sign    Worried About  Running Out of Food in the Last Year: Sometimes true    Ran Out of Food in the Last Year: Never true  Transportation Needs: No Transportation Needs (12/18/2022)   PRAPARE - Administrator, Civil Service (Medical): No    Lack of Transportation (Non-Medical): No  Physical Activity: Insufficiently Active (12/18/2022)   Exercise Vital Sign    Days of Exercise per Week: 2 days    Minutes of Exercise per Session: 10 min  Stress: Stress Concern Present (12/18/2022)   [employer] of Occupational Health - Occupational Stress Questionnaire    Feeling of Stress : Very much  Social Connections: Moderately Integrated (12/18/2022)   Social Connection and Isolation Panel [NHANES]    Frequency of Communication with Friends and Family: Twice a week    Frequency of Social Gatherings with Friends and Family: Twice a week    Attends Religious Services: 1 to 4 times per year    Active Member of [employer] or Organizations: No    Attends Banker Meetings: Never    Marital Status: Married   Review of Systems: ROS negative except for what is noted on the assessment and plan.  Objective:  Physical Exam: Vitals:   03/04/23 0833  BP: 117/66  Pulse: 80  Temp: 98.3 F (36.8 C)  TempSrc: Oral  SpO2: 99%  Weight: 200 lb 4.8 oz (90.9 kg)  Height: 5\' 5"  (1.651 m)    General appearance: not ill-appearing  Eyes:  tracking appropriately Neck: no JVD Lungs: CTAB, no crackles, no wheeze, with normal respiratory effort and no intercostal retractions CV: RRR, S1, S2, no MRGs  Abdomen: Soft, non-tender; non-distended, BS present  Skin: Normal temperature, turgor and texture; no rash Psych: Appropriate affect Neuro: Alert and oriented to person and place, no focal deficit   Assessment & Plan:  GERD (gastroesophageal reflux disease) Theresa Kirk continues to have dysphagia with solid>>liquid, it usually gets worse when she has no bowel movements. She has heartburn along with nausea. Feels  like something is stuck on her throat. Denies vomiting, diarrhea, blood in stool or any weight loss. She does not have a bowel movement everyday but eating fiber helps. She drinks one 12oz bottle of water per day on average. Pantoprazole usually helps and she takes them everyday. She has been referred to GI before but has not seen them due to co-pay she owes them.   Assessment  Chronic problem. Ddx includes peptic stricture, achalasia or other structural causes. Dehydration less likely.  Plan  - Send referral for a different GI to obtain EGD  - Increase water and fiber intake  -  Continue Pantoprazole 40 mg daily 30 min before meals   Depression Feels down and depressed.  Is not able to sleep properly at night, getting up and moving around causes her to be more tired. Denies constant restlessness, or increased energy. PHQ 9 has increased from 16 to 19 since the last visit. Used to follow [facility] but has not been there recently. Did not find it very helpful. She used to take Citalopram which she thinks caused nose bleeds and stopped couple of months ago. No nose bleeds after stopping citalopram. She reports she would like to try something else and wants to do something about it. She has had thoughts of harming herself previously but has never acted on it. Has tried Zoloft with no side effects experienced but stopped. Open to trying it again and behavioral counseling. Educated on using it every day for at least 4-6 weeks to notice any difference and she might experience some irritability the first week before it gets better.   Assessment  - Chronic depression; no BPD suspected   Plan  - start Zoloft/Sertraline 20 mg daily  - F/u in 1 month  - Referral to bx counseling with Christen Butter   Prediabetes Pt has consistently been in the pre diabetic range for 5+ years. A1c is 6.3 today down from 6.5 in 08/12/2022. Interested in nutrition and diabetes program which is covered by her  insurance.  Assessment/Plan  - Pre diabetic  - Refer to Medicare Diabetes Prevention Program   Cataract, nuclear sclerotic, both eyes Per 2019 visit at [facility], Theresa. Doolan has cataract of both eyes, borderline glaucoma, dry eyes, and primary iridocyclitis of R eye.  She reports she has been to the eye doctor after that in 2023 but the place has moved. Records have not been updated. She reports of slight blurry vision and 'things jumping" but can still see. She has headaches occasionally but attributes it to her GERD sx. Not using any topical eye drops.   Assessment  - Chronic problem  Plan  - Refer to opthalmology  Hyperlipidemia Last lab showed LDL of 162 and total cholesterol of 249. She was prescribed Crestor/Rosuvastatin last visit but has not been taking them as she does not like being on any medications. Educated on importance of taking them to reduce future CV events.   Assessment  - HLD and needs pharmacotherapy   Plan  - Referral to Diabetes Prevention program, for both diabetes and HLD management  - start Rosuvastatin 20 mg daily  Healthcare maintenance Due for colonoscopy, referral provided. A polyp has been found in the past.  Due for pap in 2028 Patient discussed with Dr. Sol Blazing

## 2023-03-04 NOTE — Progress Notes (Deleted)
     .  thn

## 2023-03-04 NOTE — Assessment & Plan Note (Signed)
Last lab showed LDL of 162 and total cholesterol of 249. She was prescribed Crestor/Rosuvastatin last visit but has not been taking them as she does not like being on any medications. Educated on importance of taking them to reduce future CV events.   Assessment  - HLD and needs pharmacotherapy   Plan  - Referral to Diabetes Prevention program, for both diabetes and HLD management  - start Rosuvastatin 20 mg daily

## 2023-03-04 NOTE — Assessment & Plan Note (Addendum)
Theresa Kirk continues to have dysphagia with solid>>liquid, it usually gets worse when she has no bowel movements. She has heartburn along with nausea. Feels like something is stuck on her throat. Denies vomiting, diarrhea, blood in stool or any weight loss. She does not have a bowel movement everyday but eating fiber helps. She drinks one 12oz bottle of water per day on average. Pantoprazole usually helps and she takes them everyday. She has been referred to GI before but has not seen them due to co-pay she owes them.   Assessment  Chronic problem. Ddx includes peptic stricture, achalasia or other structural causes. Dehydration less likely.  Plan  - Send referral for a different GI to obtain EGD  - Increase water and fiber intake  - Continue Pantoprazole 40 mg daily 30 min before meals

## 2023-03-04 NOTE — Patient Instructions (Addendum)
  New medicine: Zoloft (sertraline) - It will help with anxiety and your mood. Please take Zoloft 25 mg 1 tablet daily. It may take 4-6 weeks for you to see any changes. Please let us know if you have any side effects.  Crestor - this is for your cholesterol. It is important for you to take it to lower stroke and heart attack risks.  Referrals  Behavioral counseling - they will call you to set up an appointment  GI -It might take some time but you will get a call.  Eye doctor - You will get a call from them as well.  The Medicare Diabetes Prevention Program helps those at risk of developing diabetes learn lifestyle changes to help decrease this risk. Participants work with a Lifestyle Coach and a small group of other members to develop knowledge and skills to promote sustainable lifestyle changes.   This program includes 16 weekly sessions focusing on approaches to lifestyle change and their effect on preventing diabetes. After this time, you will have at least six lessons that occur monthly for continued support.  This program is FREE under Medicare. Please let me know if you are interested in a referral to get started with preventing diabetes and improving your health!   4. Please follow up with [place] in 1 month.

## 2023-03-04 NOTE — Assessment & Plan Note (Signed)
Per 2019 visit at [facility], Ms. Conti has cataract of both eyes, borderline glaucoma, dry eyes, and primary iridocyclitis of R eye.  She reports she has been to the eye doctor after that in 2023 but the place has moved. Records have not been updated. She reports of slight blurry vision and 'things jumping" but can still see. She has headaches occasionally but attributes it to her GERD sx. Not using any topical eye drops.   Assessment  - Chronic problem  Plan  - Refer to opthalmology

## 2023-03-04 NOTE — Assessment & Plan Note (Addendum)
Feels down and depressed.  Is not able to sleep properly at night, getting up and moving around causes her to be more tired. Denies constant restlessness, or increased energy. PHQ 9 has increased from 16 to 19 since the last visit. Used to follow [facility] but has not been there recently. Did not find it very helpful. She used to take Citalopram which she thinks caused nose bleeds and stopped couple of months ago. No nose bleeds after stopping citalopram. She reports she would like to try something else and wants to do something about it. She has had thoughts of harming herself previously but has never acted on it. No active SI. Has tried Zoloft with no side effects experienced but stopped. Open to trying it again and behavioral counseling. Educated on using the medication every day for at least 4-6 weeks to notice any difference and she might experience some irritability the first week before it gets better.   Assessment  - Chronic depression; no BPD suspected   Plan  - start Zoloft/Sertraline 25 mg daily  - F/u in 1 month  - Referral to bx counseling via [facility]

## 2023-03-04 NOTE — Progress Notes (Signed)
  Care Coordination   Note   03/04/2023 Name: Theresa Kirk MRN: [id number] DOB: 02/06/1961  Theresa Kirk is a 62 y.o. year old female who sees Dickie La, MD for primary care. I reached out to [employer] by phone today to offer care coordination services.  [name] was given information about Care Coordination services today including:   The Care Coordination services include support from the care team which includes your Nurse Coordinator, Clinical Social Worker, or Pharmacist.  The Care Coordination team is here to help remove barriers to the health concerns and goals most important to you. Care Coordination services are voluntary, and the patient may decline or stop services at any time by request to their care team member.   Care Coordination Consent Status: Patient agreed to services and verbal consent obtained.   Follow up plan:  Telephone appointment with care coordination team member scheduled for:  03/11/23  Encounter Outcome:  Patient Scheduled  [facility] Coordination Care Guide  Direct Dial: [phone]

## 2023-03-05 NOTE — Progress Notes (Signed)
Called to update on plan for lipid management 10/2.

## 2023-03-05 NOTE — Assessment & Plan Note (Signed)
Patient reports not taking ropinirole nightly. Continuing to have poor sleep. Will start with depression treatment and consider alternative therapy if RLS if sleep issues persist.

## 2023-03-07 ENCOUNTER — Other Ambulatory Visit: Payer: Self-pay | Admitting: Internal Medicine

## 2023-03-07 DIAGNOSIS — Z1211 Encounter for screening for malignant neoplasm of colon: Secondary | ICD-10-CM

## 2023-03-07 DIAGNOSIS — Z1212 Encounter for screening for malignant neoplasm of rectum: Secondary | ICD-10-CM

## 2023-03-11 ENCOUNTER — Encounter: Payer: Self-pay | Admitting: *Deleted

## 2023-03-11 ENCOUNTER — Ambulatory Visit: Payer: Self-pay | Admitting: *Deleted

## 2023-03-11 NOTE — Patient Outreach (Addendum)
  Care Coordination   Initial Visit Note   03/11/2023 Name: Theresa Kirk MRN: [id number] DOB: 11-02-1960  Theresa Kirk is a 62 y.o. year old female who sees Theresa La, MD for primary care. I spoke with  Theresa Kirk by phone today.  What matters to the patients health and wellness today?  Want to get connected with counseling support.    Goals Addressed               This Visit's Progress     Improve mental health (pt-stated)        Activities and task to complete in order to accomplish goals.   Call your insurance provider for more information about your Enhanced Benefits  Follow up with housing resources discussed [facility] [employer] to apply for Section 8 housing [phone] [employer] (home repairs. Must own your own home and meet income requirements) - [phone] www.nchousingsearch.org Greater Dietitian (select "find help") - Find Food  [place], [place] (ghpfa.org) or download the free app to your smart phone Call 988 for mental health support/crisis line 24/7 Call 911 for urgent/emergent needs Review provider info being emailed to you for counseling options and call to schedule your counseling appointment. Start / continue relaxed breathing 3 times daily Keep all upcoming appointment discussed today Continue with compliance of taking medication prescribed by Doctor         SDOH assessments and interventions completed:  Yes  SDOH Interventions Today    Flowsheet Row Most Recent Value  SDOH Interventions   Housing Interventions Intervention Not Indicated  Transportation Interventions Intervention Not Indicated  Utilities Interventions Intervention Not Indicated  Depression Interventions/Treatment  Medication, Counseling  Physical Activity Interventions Other (Comments)  [not a people person]  Social Connections Interventions Intervention Not Indicated        Care Coordination  Interventions:  Yes, provided  Interventions Today    Flowsheet Row Most Recent Value  Chronic Disease   Chronic disease during today's visit Other  [depression]  General Interventions   General Interventions Discussed/Reviewed General Interventions Discussed, General Interventions Reviewed, [employer] community resources- housing, Sr Ctrs, etc for pt to consider- pt reports "not a people person" and keeps to self a lot]  Exercise Interventions   Exercise Discussed/Reviewed Exercise Discussed  [suggested Sr Ctr, [employer] programs to consider]  Education Interventions   Education Provided Provided Therapist, sports, Provided Education  Provided Engineer, petroleum On Mental Health/Coping with Illness, Programmer, applications, Development worker, community  Mental Health Interventions   Mental Health Discussed/Reviewed Mental Health Discussed, Mental Health Reviewed, Coping Strategies, Anxiety, Depression  [Pt shares history of depression- has been on RX and had some diffculties with these- PCP helping to address. Pt was going to [place] for counseling years ago and wants to pursue counseling support again. Pt requests in-network providers info be emailed]  Pharmacy Interventions   Pharmacy Dicussed/Reviewed Pharmacy Topics Discussed  [Pt shared she has had problems with some of the RX for her mental health and PCP is working with her on this]  Safety Interventions   Safety Discussed/Reviewed Safety Discussed  [CSW alerted pt to "988" to call if needs mental health support 24/7]       Follow up plan: Follow up call scheduled for 03/20/23    Encounter Outcome:  Patient Visit Completed

## 2023-03-11 NOTE — Patient Instructions (Signed)
Visit Information  Thank you for taking time to visit with me today. Please don't hesitate to contact me if I can be of assistance to you.   Following are the goals we discussed today:   Goals Addressed               This Visit's Progress     Improve mental health (pt-stated)        Activities and task to complete in order to accomplish goals.   Call your insurance provider for more information about your Enhanced Benefits  Follow up with housing resources discussed [facility] [employer] to apply for Section 8 housing [phone] [employer] (home repairs. Must own your own home and meet income requirements) - [phone] www.nchousingsearch.org Greater Dietitian (select "find help") - Find Food  [place], [place] (ghpfa.org) or download the free app to your smart phone Call 988 for mental health support/crisis line 24/7 Call 911 for urgent/emergent needs Review provider info being emailed to you for counseling options and call to schedule your counseling appointment. Start / continue relaxed breathing 3 times daily Keep all upcoming appointment discussed today Continue with compliance of taking medication prescribed by Doctor         Our next appointment is by telephone on 03/20/23  Please call the care guide team at [phone] if you need to cancel or reschedule your appointment.   If you are experiencing a Mental Health or Behavioral Health Crisis or need someone to talk to, please call the Suicide and Crisis Lifeline: 988 call 911   The patient verbalized understanding of instructions, educational materials, and care plan provided today and DECLINED offer to receive copy of patient instructions, educational materials, and care plan.   Telephone follow up appointment with care management team member scheduled for:03/20/23  Reece Levy, MSW, LCSW Clinical Social Worker [phone]

## 2023-03-20 ENCOUNTER — Ambulatory Visit: Payer: Self-pay | Admitting: *Deleted

## 2023-03-20 NOTE — Patient Outreach (Signed)
  Care Coordination   Follow Up Visit Note   03/20/2023 Name: Theresa Kirk MRN: [id number] DOB: [birth year]  Theresa Kirk is a 62 y.o. year old female who sees Dickie La, MD for primary care. I spoke with  Nikka Kuk by phone today.  What matters to the patients health and wellness today?  Pt reports having a good birthday earlier this week.    Goals Addressed               This Visit's Progress     Improve mental health (pt-stated)        Activities and task to complete in order to accomplish goals.   Look for email I resent to you today Call your insurance provider for more information about your Enhanced Benefits  Follow up with housing resources discussed [facility] [employer] to apply for Section 8 housing [phone] [employer] (home repairs. Must own your own home and meet income requirements) - [phone] www.nchousingsearch.org Greater Dietitian (select "find help") - Find Food  [place], [place] (ghpfa.org) or download the free app to your smart phone Call 988 for mental health support/crisis line 24/7 Call 911 for urgent/emergent needs Review provider info being emailed to you for counseling options and call to schedule your counseling appointment. Start / continue relaxed breathing 3 times daily Keep all upcoming appointment discussed today Continue with compliance of taking medication prescribed by Doctor         SDOH assessments and interventions completed:  Yes     Care Coordination Interventions:  Yes, provided  Interventions Today    Flowsheet Row Most Recent Value  Chronic Disease   Chronic disease during today's visit Other  [depression]  General Interventions   General Interventions Discussed/Reviewed [employer]  [provided resources to pt via email and in Pt Instructions]  Education Interventions   Provided Verbal Education On Mental Health/Coping with Illness,  Programmer, applications  Mental Health Interventions   Mental Health Discussed/Reviewed Mental Health Discussed, Depression  [Pt indicates she has not had chance to review material sent for Training and development officer. Encouraged pt to review email and links provided for selection]       Follow up plan: Follow up call scheduled for 04/17/23    Encounter Outcome:  Patient Visit Completed

## 2023-03-20 NOTE — Patient Instructions (Signed)
Visit Information  Thank you for taking time to visit with me today. Please don't hesitate to contact me if I can be of assistance to you.   Following are the goals we discussed today:   Goals Addressed               This Visit's Progress     Improve mental health (pt-stated)        Activities and task to complete in order to accomplish goals.   Look for email I resent to you today Call your insurance provider for more information about your Enhanced Benefits  Follow up with housing resources discussed [facility] [employer] to apply for Section 8 housing [phone] [employer] (home repairs. Must own your own home and meet income requirements) - [phone] www.nchousingsearch.org Greater Dietitian (select "find help") - Find Food  [place], [place] (ghpfa.org) or download the free app to your smart phone Call 988 for mental health support/crisis line 24/7 Call 911 for urgent/emergent needs Review provider info being emailed to you for counseling options and call to schedule your counseling appointment. Start / continue relaxed breathing 3 times daily Keep all upcoming appointment discussed today Continue with compliance of taking medication prescribed by Doctor         Our next appointment is by telephone on 04/17/23   Please call the care guide team at [phone] if you need to cancel or reschedule your appointment.   If you are experiencing a Mental Health or Behavioral Health Crisis or need someone to talk to, please call the Suicide and Crisis Lifeline: 988 call 911   The patient verbalized understanding of instructions, educational materials, and care plan provided today and DECLINED offer to receive copy of patient instructions, educational materials, and care plan.   Telephone follow up appointment with care management team member scheduled for: 04/17/23  Reece Levy, MSW, LCSW Clinical Social  Worker [phone]

## 2023-04-17 ENCOUNTER — Ambulatory Visit: Payer: Self-pay | Admitting: *Deleted

## 2023-04-17 NOTE — Patient Instructions (Signed)
Visit Information  Thank you for taking time to visit with me today. Please don't hesitate to contact me if I can be of assistance to you.   Following are the goals we discussed today:   Goals Addressed               This Visit's Progress     Improve mental health (pt-stated)        Activities and task to complete in order to accomplish goals.   Call to cancel and reschedule your PCP visit  Look for email I resent to you today Call your insurance provider for more information about your Enhanced Benefits  Follow up with housing resources discussed [facility] [employer] to apply for Section 8 housing [phone] [employer] (home repairs. Must own your own home and meet income requirements) - [phone] www.nchousingsearch.org Greater Dietitian (select "find help") - Find Food  [place], [place] (ghpfa.org) or download the free app to your smart phone Call 988 for mental health support/crisis line 24/7 Call 911 for urgent/emergent needs Review provider info being emailed to you for counseling options and call to schedule your counseling appointment. Start / continue relaxed breathing 3 times daily Keep all upcoming appointment discussed today Continue with compliance of taking medication prescribed by Doctor         Our next appointment is by telephone on 05/15/23  Please call the care guide team at [phone] if you need to cancel or reschedule your appointment.   If you are experiencing a Mental Health or Behavioral Health Crisis or need someone to talk to, please call the Suicide and Crisis Lifeline: 988 call 911   The patient verbalized understanding of instructions, educational materials, and care plan provided today and DECLINED offer to receive copy of patient instructions, educational materials, and care plan.   Telephone follow up appointment with care management team member scheduled for:05/15/23 Reece Levy, MSW, LCSW [facility]  [facility] Licensed Clinical Social Worker Care Coordinator  [phone]

## 2023-04-17 NOTE — Patient Outreach (Signed)
  Care Coordination   Follow Up Visit Note   04/17/2023 Name: Theresa Kirk MRN: [id number] DOB: 06-23-1960  Theresa Kirk is a 62 y.o. year old female who sees Dickie La, MD for primary care. I spoke with  Theresa Kirk by phone today.  What matters to the patients health and wellness today?  Denies any concerns-    Goals Addressed               This Visit's Progress     Improve mental health (pt-stated)        Activities and task to complete in order to accomplish goals.   Call to cancel and reschedule your PCP visit  Look for email I resent to you today Call your insurance provider for more information about your Enhanced Benefits  Follow up with housing resources discussed [facility] [employer] to apply for Section 8 housing [phone] [employer] (home repairs. Must own your own home and meet income requirements) - [phone] www.nchousingsearch.org Greater Dietitian (select "find help") - Find Food  [place], [place] (ghpfa.org) or download the free app to your smart phone Call 988 for mental health support/crisis line 24/7 Call 911 for urgent/emergent needs Review provider info being emailed to you for counseling options and call to schedule your counseling appointment. Start / continue relaxed breathing 3 times daily Keep all upcoming appointment discussed today Continue with compliance of taking medication prescribed by Doctor         SDOH assessments and interventions completed:  Yes     Care Coordination Interventions:  Yes, provided  Interventions Today    Flowsheet Row Most Recent Value  Chronic Disease   Chronic disease during today's visit Other  [depression]  General Interventions   General Interventions Discussed/Reviewed General Interventions Discussed, [facility], Doctor Visits  [Pt has not reviewed material sent to her- encouraged pt to review and consider the resources  and support]  Doctor Visits Discussed/Reviewed PCP  [Pt reports she will be out of town on11/26 and needs to reschedule her PCP visit- CSW encouraged her to call today]  Mental Health Interventions   Mental Health Discussed/Reviewed Mental Health Discussed, Coping Strategies  [Pt looking forward to going out of town for Thanksgiving- being with family. 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 Pt has not reviewed counseling material sent for seeking counseling support yet.]       Follow up plan: Follow up call scheduled for 05/15/23    Encounter Outcome:  Patient Visit Completed

## 2023-04-22 DIAGNOSIS — Z1211 Encounter for screening for malignant neoplasm of colon: Secondary | ICD-10-CM | POA: Diagnosis not present

## 2023-04-22 DIAGNOSIS — R1906 Epigastric swelling, mass or lump: Secondary | ICD-10-CM | POA: Diagnosis not present

## 2023-04-22 DIAGNOSIS — R1319 Other dysphagia: Secondary | ICD-10-CM | POA: Diagnosis not present

## 2023-04-22 DIAGNOSIS — K5909 Other constipation: Secondary | ICD-10-CM | POA: Diagnosis not present

## 2023-04-22 DIAGNOSIS — K219 Gastro-esophageal reflux disease without esophagitis: Secondary | ICD-10-CM | POA: Diagnosis not present

## 2023-04-22 DIAGNOSIS — Z01818 Encounter for other preprocedural examination: Secondary | ICD-10-CM | POA: Diagnosis not present

## 2023-04-29 ENCOUNTER — Encounter: Payer: 59 | Admitting: Internal Medicine

## 2023-05-06 DIAGNOSIS — K200 Eosinophilic esophagitis: Secondary | ICD-10-CM | POA: Diagnosis not present

## 2023-05-06 DIAGNOSIS — R1319 Other dysphagia: Secondary | ICD-10-CM | POA: Diagnosis not present

## 2023-05-06 DIAGNOSIS — Z1211 Encounter for screening for malignant neoplasm of colon: Secondary | ICD-10-CM | POA: Diagnosis not present

## 2023-05-15 ENCOUNTER — Ambulatory Visit: Payer: Self-pay | Admitting: Licensed Clinical Social Worker

## 2023-05-15 ENCOUNTER — Encounter: Payer: Self-pay | Admitting: *Deleted

## 2023-05-15 NOTE — Patient Instructions (Signed)
Visit Information  Thank you for taking time to visit with me today. Please don't hesitate to contact me if I can be of assistance to you.   Following are the goals we discussed today:   Goals Addressed               This Visit's Progress     COMPLETED: Improve mental health (pt-stated)        Activities and task to complete in order to accomplish goals.   Call to cancel and reschedule your PCP visit  Look for email I resent to you today Call your insurance provider for more information about your Enhanced Benefits  Follow up with housing resources discussed [facility] [employer] to apply for Section 8 housing [phone] [employer] (home repairs. Must own your own home and meet income requirements) - [phone] www.nchousingsearch.org Greater Dietitian (select "find help") - Find Food  [place], [place] (ghpfa.org) or download the free app to your smart phone Call 988 for mental health support/crisis line 24/7 Call 911 for urgent/emergent needs Review provider info being emailed to you for counseling options and call to schedule your counseling appointment. Start / continue relaxed breathing 3 times daily Keep all upcoming appointment discussed today Continue with compliance of taking medication prescribed by Doctor            Please call the care guide team at [phone] if you need to cancel or reschedule your appointment.   If you are experiencing a Mental Health or Behavioral Health Crisis or need someone to talk to, please call the Suicide and Crisis Lifeline: 988  Patient verbalizes understanding of instructions and care plan provided today and agrees to view in MyChart. Active MyChart status and patient understanding of how to access instructions and care plan via MyChart confirmed with patient.     No further follow up required: please contact care guide to reconnect if needed.

## 2023-05-15 NOTE — Patient Outreach (Signed)
  Care Coordination   Follow Up Visit Note   05/15/2023 Name: Theresa Kirk MRN: [id number] DOB: 09-Oct-1960  Theresa Kirk is a 62 y.o. year old female who sees Dickie La, MD for primary care. I spoke with  Sabah Velazquez by phone today.  What matters to the patients health and wellness today?  My anxiety is ok for now, please mail a list of therapist for use later.     Goals Addressed               This Visit's Progress     COMPLETED: Improve mental health (pt-stated)        Activities and task to complete in order to accomplish goals.   Call to cancel and reschedule your PCP visit  Look for email I resent to you today Call your insurance provider for more information about your Enhanced Benefits  Follow up with housing resources discussed [facility] [employer] to apply for Section 8 housing [phone] [employer] (home repairs. Must own your own home and meet income requirements) - [phone] www.nchousingsearch.org Greater Dietitian (select "find help") - Find Food  [place], [place] (ghpfa.org) or download the free app to your smart phone Call 988 for mental health support/crisis line 24/7 Call 911 for urgent/emergent needs Review provider info being emailed to you for counseling options and call to schedule your counseling appointment. Start / continue relaxed breathing 3 times daily Keep all upcoming appointment discussed today Continue with compliance of taking medication prescribed by Doctor          SDOH assessments and interventions completed:  Yes  SDOH Interventions Today    Flowsheet Row Most Recent Value  SDOH Interventions   Food Insecurity Interventions Intervention Not Indicated  Housing Interventions Intervention Not Indicated  Transportation Interventions Intervention Not Indicated  Utilities Interventions Intervention Not Indicated        Care Coordination Interventions:  Yes,  provided   Interventions Today    Flowsheet Row Most Recent Value  Chronic Disease   Chronic disease during today's visit Other  [Anxiety]  General Interventions   General Interventions Discussed/Reviewed [facility], General Interventions Discussed  [We discussed patient's food insecurity, pt reported that she was doing well affording food at this time and confirmed having resources for food banks if this changes.]  Mental Health Interventions   Mental Health Discussed/Reviewed Mental Health Discussed, Coping Strategies, Anxiety  [We discussed patient's anxiety levels with upcoming holidays and home stressors. Patient reported that she was coping well and denied needing to see a therapist at this time. Patient would like list of therapist mailed to her in case this changes.]        Follow up plan: No further intervention required.   Encounter Outcome:  Patient Visit Completed

## 2023-06-09 DIAGNOSIS — K635 Polyp of colon: Secondary | ICD-10-CM | POA: Diagnosis not present

## 2023-06-09 DIAGNOSIS — Z1211 Encounter for screening for malignant neoplasm of colon: Secondary | ICD-10-CM | POA: Diagnosis not present

## 2023-06-11 LAB — HM COLONOSCOPY

## 2023-07-03 ENCOUNTER — Emergency Department (HOSPITAL_COMMUNITY)
Admission: EM | Admit: 2023-07-03 | Discharge: 2023-07-03 | Disposition: A | Discharge status: Home / Self Care | Payer: 59 | Attending: Student | Admitting: Student

## 2023-07-03 ENCOUNTER — Emergency Department (HOSPITAL_COMMUNITY): Payer: 59

## 2023-07-03 ENCOUNTER — Other Ambulatory Visit: Payer: Self-pay

## 2023-07-03 ENCOUNTER — Encounter (HOSPITAL_COMMUNITY): Payer: Self-pay | Admitting: Emergency Medicine

## 2023-07-03 DIAGNOSIS — M7732 Calcaneal spur, left foot: Secondary | ICD-10-CM | POA: Diagnosis not present

## 2023-07-03 DIAGNOSIS — L03039 Cellulitis of unspecified toe: Secondary | ICD-10-CM | POA: Insufficient documentation

## 2023-07-03 DIAGNOSIS — D72829 Elevated white blood cell count, unspecified: Secondary | ICD-10-CM | POA: Insufficient documentation

## 2023-07-03 DIAGNOSIS — M19072 Primary osteoarthritis, left ankle and foot: Secondary | ICD-10-CM | POA: Diagnosis not present

## 2023-07-03 DIAGNOSIS — L03032 Cellulitis of left toe: Secondary | ICD-10-CM | POA: Diagnosis not present

## 2023-07-03 DIAGNOSIS — M79675 Pain in left toe(s): Secondary | ICD-10-CM | POA: Diagnosis present

## 2023-07-03 DIAGNOSIS — I10 Essential (primary) hypertension: Secondary | ICD-10-CM | POA: Diagnosis not present

## 2023-07-03 DIAGNOSIS — M7989 Other specified soft tissue disorders: Secondary | ICD-10-CM | POA: Diagnosis not present

## 2023-07-03 LAB — COMPREHENSIVE METABOLIC PANEL
ALT: 24 U/L (ref 0–44)
AST: 20 U/L (ref 15–41)
Albumin: 4.1 g/dL (ref 3.5–5.0)
Alkaline Phosphatase: 88 U/L (ref 38–126)
Anion gap: 8 (ref 5–15)
BUN: 19 mg/dL (ref 8–23)
CO2: 25 mmol/L (ref 22–32)
Calcium: 9.1 mg/dL (ref 8.9–10.3)
Chloride: 105 mmol/L (ref 98–111)
Creatinine, Ser: 0.95 mg/dL (ref 0.44–1.00)
GFR, Estimated: >60 mL/min (ref >60)
Glucose, Bld: 126 mg/dL — ABNORMAL HIGH (ref 70–99)
Potassium: 4 mmol/L (ref 3.5–5.1)
Sodium: 138 mmol/L (ref 135–145)
Total Bilirubin: 0.4 mg/dL (ref 0.0–1.2)
Total Protein: 7.6 g/dL (ref 6.5–8.1)

## 2023-07-03 LAB — CBC WITH DIFFERENTIAL/PLATELET
Abs Immature Granulocytes: 0.03 10*3/uL (ref 0.00–0.07)
Basophils Absolute: 0.1 10*3/uL (ref 0.0–0.1)
Basophils Relative: 0 %
Eosinophils Absolute: 0.3 10*3/uL (ref 0.0–0.5)
Eosinophils Relative: 3 %
HCT: 39.9 % (ref 36.0–46.0)
Hemoglobin: 12.5 g/dL (ref 12.0–15.0)
Immature Granulocytes: 0 %
Lymphocytes Relative: 51 %
Lymphs Abs: 6.1 10*3/uL — ABNORMAL HIGH (ref 0.7–4.0)
MCH: 29.5 pg (ref 26.0–34.0)
MCHC: 31.3 g/dL (ref 30.0–36.0)
MCV: 94.1 fL (ref 80.0–100.0)
Monocytes Absolute: 1 10*3/uL (ref 0.1–1.0)
Monocytes Relative: 9 %
Neutro Abs: 4.5 10*3/uL (ref 1.7–7.7)
Neutrophils Relative %: 37 %
Platelets: 310 10*3/uL (ref 150–400)
RBC: 4.24 MIL/uL (ref 3.87–5.11)
RDW: 13.7 % (ref 11.5–15.5)
WBC: 12 10*3/uL — ABNORMAL HIGH (ref 4.0–10.5)
nRBC: 0 % (ref 0.0–0.2)

## 2023-07-03 LAB — I-STAT CG4 LACTIC ACID, ED: Lactic Acid, Venous: 1 mmol/L (ref 0.5–1.9)

## 2023-07-03 MED ORDER — DOXYCYCLINE HYCLATE 100 MG PO CAPS: 100.0000 mg | ORAL_CAPSULE | Freq: Two times a day (BID) | ORAL | 0 refills | Status: AC

## 2023-07-03 MED ORDER — DOXYCYCLINE HYCLATE 100 MG PO TABS
100.0000 mg | ORAL_TABLET | Freq: Once | ORAL | Status: AC
  Administered 2023-07-03: 100 mg via ORAL
  Filled 2023-07-03: qty 1

## 2023-07-03 NOTE — Discharge Instructions (Addendum)
The x-ray today showed that you have some swelling in your left pinky toe, but no signs of infection extending further down to the bone.  Your blood work today shows an elevated white blood cell count.  This elevates when the body is fighting off infection or has inflammation.  This is expected with your toe infection.  You are being treated for a toe infection with an antibiotic called doxycycline.  Please take this every 12 hours with food for the next 7 days.  Please take the whole course of this antibiotic even if you start feeling better.  Please follow-up with your PCP if your toe swelling has not completely resolved by the end of the antibiotic course.  Your blood pressure was elevated today at 159/90.  Please follow-up with your PCP within the next month to discuss further blood pressure control.  Return the ER immediately if you have any fevers, red streaking up your foot, worsening swelling, any other new or concerning symptoms.

## 2023-07-03 NOTE — ED Provider Triage Note (Signed)
Emergency Medicine Provider Triage Evaluation Note  Theresa Kirk , a 63 y.o. female  was evaluated in triage.  Pt complains of left pinky toe pain, swelling, and pus drainage.  States the pain and swelling started about 3 days ago when she felt a sharp pain at night and thought she had a bug bite.  Pain and swelling has worsened since then, spreading further up the foot.  Review of Systems  Positive: As above Negative: As above  Physical Exam  BP (!) 159/90   Pulse 75   Temp 98 F (36.7 C) (Oral)   Resp 20   Wt 91.6 kg   LMP 09/14/2006   SpO2 100%   BMI 33.61 kg/m  Gen:   Awake, no distress   Resp:  Normal effort  MSK:   Moves extremities without difficulty   Left pinky toe erythematous and edematous, edema spreading up to the mid dorsum of the foot.  No red streaking.  No pus drainage noted currently.  Medical Decision Making  Medically screening exam initiated at 9:13 PM.  Appropriate orders placed.  Mathew Roemmich was informed that the remainder of the evaluation will be completed by another provider, this initial triage assessment does not replace that evaluation, and the importance of remaining in the ED until their evaluation is complete.     Arabella Merles, PA-C 07/03/23 2114

## 2023-07-03 NOTE — ED Provider Notes (Signed)
[facility] DE[facility]MENT AT [facility] Provider Note   CSN: [id number] Arrival date & time: 07/03/23  2031     History  Chief Complaint  Patient presents with   Toe Pain    Theresa Kirk is a 63 y.o. female with history of hypertension, prediabetes, presents with concern for left pinky toe pain, swelling, and pus drainage.  States she fell a sharp pain at night 3 days ago and so she thought she had gotten bitten by a bug.  Pain and swelling has worsened since then, spreading further up the foot.  Denies any fever or chills.   Toe Pain       Home Medications Prior to Admission medications   Medication Sig Start Date End Date Taking? Authorizing Provider  doxycycline (VIBRAMYCIN) 100 MG capsule Take 1 capsule (100 mg total) by mouth 2 (two) times daily for 7 days. 07/03/23 07/10/23 Yes Arabella Merles, PA-C  albuterol (VENTOLIN HFA) 108 (90 Base) MCG/ACT inhaler Inhale 1-2 puffs into the lungs every 6 (six) hours as needed for wheezing or shortness of breath. Patient not taking: Reported on 12/18/2022 05/16/21   Earl Lagos, MD  Cetirizine HCl 10 MG CAPS Take 1 capsule (10 mg total) by mouth daily. 05/22/22   Katsadouros, Vasilios, MD  gabapentin (NEURONTIN) 100 MG capsule Take 1 capsule (100 mg total) by mouth 3 (three) times daily. 09/09/22   Dickie La, MD  meloxicam (MOBIC) 15 MG tablet Take 1 tablet (15 mg total) by mouth daily. Patient not taking: Reported on 12/18/2022 08/27/22   Felecia Shelling, DPM  pantoprazole (PROTONIX) 40 MG tablet TAKE 1 TABLET BY MOUTH EVERY DAY(TAKE 30 MINS BEFORE MEALS) 05/22/22   Katsadouros, Vasilios, MD  sertraline (ZOLOFT) 25 MG tablet Take 1 tablet (25 mg total) by mouth daily. 03/04/23   Dickie La, MD      Allergies    Iohexol and Ibuprofen    Review of Systems   Review of Systems  Constitutional:  Negative for fever.  Skin:        Swelling of left pinky toe    Physical Exam Updated Vital Signs BP (!) 155/79  (BP Location: Left Arm)   Pulse 72   Temp 97.8 F (36.6 C) (Oral)   Resp 19   Wt 91.6 kg   LMP 09/14/2006   SpO2 99%   BMI 33.61 kg/m  Physical Exam Vitals and nursing note reviewed.  Constitutional:      Appearance: Normal appearance.  HENT:     Head: Atraumatic.  Cardiovascular:     Rate and Rhythm: Normal rate and regular rhythm.     Comments: 2+ dorsalis pedis pulse of the left foot Pulmonary:     Effort: Pulmonary effort is normal.  Musculoskeletal:     Comments: Left pinky toe with edema and slight erythema.  Slight edema spreading up into about the middle of the left foot.  No pus drainage from the toe.  No open wounds, but there is a scabbed over circular area at the top of the left pinky toe. No red streaking up the foot  Able to wiggle 1st through 5th toes.  Neurological:     General: No focal deficit present.     Mental Status: She is alert.     Comments: Able to feel sensation on the left pinky toe    Psychiatric:        Mood and Affect: Mood normal.        Behavior: Behavior  normal.     ED Results / Procedures / Treatments   Labs (all labs ordered are listed, but only abnormal results are displayed) Labs Reviewed  COMPREHENSIVE METABOLIC PANEL - Abnormal; Notable for the following components:      Result Value   Glucose, Bld 126 (*)    All other components within normal limits  CBC WITH DIFFERENTIAL/PLATELET - Abnormal; Notable for the following components:   WBC 12.0 (*)    Lymphs Abs 6.1 (*)    All other components within normal limits  I-STAT CG4 LACTIC ACID, ED    EKG None  Radiology DG Foot Complete Left Result Date: 07/03/2023 CLINICAL DATA:  Fifth toe pain with swelling and drainage, initial encounter EXAM: LEFT FOOT - COMPLETE 3+ VIEW COMPARISON:  None Available. FINDINGS: Mild calcaneal spurring and tarsal degenerative changes seen. No acute fracture or dislocation is noted. Mild soft tissue swelling is noted without evidence of  subcutaneous air or bony erosive changes. IMPRESSION: Mild soft tissue swelling in the fifth digit without underlying bony abnormality. Electronically Signed   By: Alcide Clever M.D.   On: 07/03/2023 22:02    Procedures Procedures    Medications Ordered in ED Medications  doxycycline (VIBRA-TABS) tablet 100 mg (100 mg Oral Given 07/03/23 2237)    ED Course/ Medical Decision Making/ A&P                                 Medical Decision Making Amount and/or Complexity of Data Reviewed Labs: ordered. Radiology: ordered.     Differential diagnosis includes but is not limited to cellulitis, soft tissue infection, osteomyelitis  ED Course:  Patient well-appearing, stable vital signs aside from an elevated blood pressure of 159/90 here today.  No fever, no tachycardia. Has edema and slight erythema of the left pinky toe.  Scabbed over abrasion to the top of the left pinky toe.  Some edema extending up into the middle of the left foot.  No pus drainage from the left pinky toe, no streaking.  Able to wiggle left toe without difficulty.  X-ray without any sign of osteomyelitis.  No concern for systemic infection given no vital sign abnormalities, edema and erythema localized to the left pinky toe.  Patient's presentation consistent with cellulitis of the left pinky toe.  Was given first dose of doxycycline here today. I Ordered, and personally interpreted labs.  The pertinent results include:   CBC with leukocytosis of 12 CMP unremarkable Lactic acid within normal limits   Impression: Left pinky toe cellulitis  Disposition:  The patient was discharged home with instructions to take 7-day course of doxycycline as prescribed.  Follow-up with PCP if symptoms not improved by the end of the antibiotic course. Return precautions given.  Imaging Studies ordered: I ordered imaging studies including x-ray left foot I independently visualized the imaging with scope of interpretation limited to  determining acute life threatening conditions related to emergency care. Imaging showed mild soft tissue swelling in the fifth digit of the left foot without bony abnormality I agree with the radiologist interpretation              Final Clinical Impression(s) / ED Diagnoses Final diagnoses:  Cellulitis of fifth toe    Rx / DC Orders ED Discharge Orders          Ordered    doxycycline (VIBRAMYCIN) 100 MG capsule  2 times daily  07/03/23 2227              Arabella Merles, PA-C 07/03/23 2240    Glendora Score, MD 07/04/23 1244

## 2023-07-03 NOTE — ED Triage Notes (Signed)
Pt in with reported L 5th toe pain, swelling and "pus drainage". Pt states 3 nights ago, she was asleep and a sharp pain to the toe woke her up, and she thinks maybe a bug bit her. Pain and swelling worsened since

## 2023-07-07 ENCOUNTER — Telehealth: Payer: Self-pay | Admitting: Internal Medicine

## 2023-07-07 NOTE — Telephone Encounter (Signed)
-----   Message from Dickie La sent at 07/04/2023  8:57 AM EST ----- Regarding: ED f/u Patient will need an appointment in 1-2 weeks for ED f/u of foot cellulitis. Thank you.

## 2023-07-07 NOTE — Telephone Encounter (Signed)
Please refer to message below.  Patient was contacted at the request of Dr. Dickie La to schedule an ED f/u appointment.  Appointment has been scheduled Monday 07/14/23 at 8:45 am with Dr Kathleen Lime.

## 2023-07-08 DIAGNOSIS — K5909 Other constipation: Secondary | ICD-10-CM | POA: Diagnosis not present

## 2023-07-08 DIAGNOSIS — Z860100 Personal history of colon polyps, unspecified: Secondary | ICD-10-CM | POA: Diagnosis not present

## 2023-07-08 DIAGNOSIS — K219 Gastro-esophageal reflux disease without esophagitis: Secondary | ICD-10-CM | POA: Diagnosis not present

## 2023-07-08 DIAGNOSIS — K648 Other hemorrhoids: Secondary | ICD-10-CM | POA: Diagnosis not present

## 2023-07-08 DIAGNOSIS — R1319 Other dysphagia: Secondary | ICD-10-CM | POA: Diagnosis not present

## 2023-07-08 DIAGNOSIS — K449 Diaphragmatic hernia without obstruction or gangrene: Secondary | ICD-10-CM | POA: Diagnosis not present

## 2023-07-14 ENCOUNTER — Telehealth: Payer: Self-pay | Admitting: Internal Medicine

## 2023-07-14 ENCOUNTER — Encounter: Payer: Self-pay | Admitting: Student

## 2023-07-14 ENCOUNTER — Ambulatory Visit (INDEPENDENT_AMBULATORY_CARE_PROVIDER_SITE_OTHER): Payer: 59 | Admitting: Student

## 2023-07-14 VITALS — BP 122/64 | HR 79 | Temp 97.8°F | Ht 65.0 in | Wt 207.5 lb

## 2023-07-14 DIAGNOSIS — F332 Major depressive disorder, recurrent severe without psychotic features: Secondary | ICD-10-CM

## 2023-07-14 DIAGNOSIS — F321 Major depressive disorder, single episode, moderate: Secondary | ICD-10-CM

## 2023-07-14 DIAGNOSIS — B353 Tinea pedis: Secondary | ICD-10-CM | POA: Insufficient documentation

## 2023-07-14 DIAGNOSIS — L03032 Cellulitis of left toe: Secondary | ICD-10-CM

## 2023-07-14 DIAGNOSIS — Z1231 Encounter for screening mammogram for malignant neoplasm of breast: Secondary | ICD-10-CM

## 2023-07-14 DIAGNOSIS — F329 Major depressive disorder, single episode, unspecified: Secondary | ICD-10-CM

## 2023-07-14 DIAGNOSIS — R7303 Prediabetes: Secondary | ICD-10-CM

## 2023-07-14 DIAGNOSIS — F331 Major depressive disorder, recurrent, moderate: Secondary | ICD-10-CM | POA: Insufficient documentation

## 2023-07-14 DIAGNOSIS — L84 Corns and callosities: Secondary | ICD-10-CM

## 2023-07-14 LAB — POCT GLYCOSYLATED HEMOGLOBIN (HGB A1C): Hemoglobin A1C: 6.3 % — AB (ref 4.0–5.6)

## 2023-07-14 LAB — GLUCOSE, CAPILLARY: Glucose-Capillary: 136 mg/dL — ABNORMAL HIGH (ref 70–99)

## 2023-07-14 MED ORDER — SERTRALINE HCL 25 MG PO TABS: 25.0000 mg | ORAL_TABLET | Freq: Every day | ORAL | 2 refills | Status: DC

## 2023-07-14 NOTE — Assessment & Plan Note (Signed)
 Patient has a history of prediabetes.  Last hemoglobin A1c 4 months ago was 6.3.  Patient elected lifestyle modification during her last visit.  Will repeat hemoglobin A1c in office today.  If hemoglobin A1c is stable at 6.3 or has gotten better, will  continue to counsel patient on lifestyle modification.  If hemoglobin A1c comes back higher than 6.3, I will call patient  to discuss available medical therapies. - Repeat hemoglobin A1c -Follow-up in 6 months

## 2023-07-14 NOTE — Patient Instructions (Addendum)
 Thank you, Ms.Layney Gause for allowing us  to provide your care today. Today we discussed discussed your recent ED visit for your left foot cellulitis.  So discussed your blood sugars and your other chronic medical conditions. Today I would like to do some blood work to ensure your infection has fully cleared.  I would also repeat your blood sugar to make sure that they are stable or getting better.  If the labs comes back and your blood sugar is elevated I will call you to discuss available therapies.  I will also refill your depression medication Zoloft .  I have ordered the following labs for you:  Lab Orders         CBC no Diff         Glucose, capillary         POC Hbg A1C       Tests ordered today:    Referrals ordered today:   Referral Orders  No referral(s) requested today     I have ordered the following medication/changed the following medications:   Stop the following medications: Medications Discontinued During This Encounter  Medication Reason   sertraline  (ZOLOFT ) 25 MG tablet Duplicate     Start the following medications: Meds ordered this encounter  Medications   sertraline  (ZOLOFT ) 25 MG tablet    Sig: Take 1 tablet (25 mg total) by mouth daily.    Dispense:  30 tablet    Refill:  2     Follow up: 6 months   Remember:   Should you have any questions or concerns please call the internal medicine clinic at [phone].    Fay Hoop, M.D [facility]

## 2023-07-14 NOTE — Telephone Encounter (Signed)
 Order placed. Thank you.

## 2023-07-14 NOTE — Progress Notes (Signed)
 CC: ED visit F/U  HPI:  [name] is a 63 y.o. female living with a history stated below and presents today for ED visit follow-up for which the patient was seen and treated for cellulitis of the left 5th great toe.  Patient was discharged with 7 days course of doxycycline .  Patient denies any fever or chills.  Please see problem based assessment and plan for additional details.  Past Medical History:  Diagnosis Date   Abnormal mammogram with microcalcification 10/15/2013   Acquired hallux limitus of both feet 10/15/2019   Acute pain of right wrist 02/03/2018   Alcohol abuse    Arthritis    Asthma    Callus of foot 06/07/2008   Qualifier: Diagnosis of  By: Derald Flattery MD, Vijay     Carpal tunnel syndrome    Cervical lymphadenopathy 09/21/2019   Cervical muscle pain 05/05/2014   - Pt with cervical pain radiating down her neck with no signs of radiculopathy or weakness in upper extremities   Depression    Fibromyalgia    Healthcare maintenance 04/13/2012   High cholesterol    Irritation symptom of skin 12/30/2017    Current Outpatient Medications on File Prior to Visit  Medication Sig Dispense Refill   albuterol  (VENTOLIN  HFA) 108 (90 Base) MCG/ACT inhaler Inhale 1-2 puffs into the lungs every 6 (six) hours as needed for wheezing or shortness of breath. (Patient not taking: Reported on 12/18/2022) 18 g 0   Cetirizine  HCl 10 MG CAPS Take 1 capsule (10 mg total) by mouth daily. 60 capsule 0   gabapentin  (NEURONTIN ) 100 MG capsule Take 1 capsule (100 mg total) by mouth 3 (three) times daily. 90 capsule 0   meloxicam  (MOBIC ) 15 MG tablet Take 1 tablet (15 mg total) by mouth daily. (Patient not taking: Reported on 12/18/2022) 30 tablet 1   pantoprazole  (PROTONIX ) 40 MG tablet TAKE 1 TABLET BY MOUTH EVERY DAY(TAKE 30 MINS BEFORE MEALS) 90 tablet 1   No current facility-administered medications on file prior to visit.    Family History  Problem Relation Age of Onset   Diabetes  Mother    Hypertension Mother    Stroke Mother    Diabetes Sister    Thyroid  disease Sister    Hypertension Sister    Cancer Maternal Aunt        unknown   Breast cancer Maternal Aunt     Social History   Socioeconomic History   Marital status: Single    Spouse name: Not on file   Number of children: Not on file   Years of education: Not on file   Highest education level: Not on file  Occupational History   Not on file  Tobacco Use   Smoking status: Former    Current packs/day: 0.00    Types: Cigarettes    Quit date: 06/03/2000    Years since quitting: 23.1   Smokeless tobacco: Never  Vaping Use   Vaping status: Never Used  Substance and Sexual Activity   Alcohol use: Not Currently   Drug use: Yes    Types: Marijuana   Sexual activity: Not on file  Other Topics Concern   Not on file  Social History Narrative   Lives with mother in [place].   Currently unemployed.   Used to work at [employer] in [place].   ETOH use: weekend, 1-2 beers from [employer]: used to drink everyday   Former smoker   Current THC use   H/o cocaine use: quit  in 2001.   Social Drivers of Corporate investment banker Strain: Low Risk  (12/18/2022)   Overall Financial Resource Strain (CARDIA)    Difficulty of Paying Living Expenses: Not very hard  Food Insecurity: Food Insecurity Present (05/15/2023)   Hunger Vital Sign    Worried About Running Out of Food in the Last Year: Sometimes true    Ran Out of Food in the Last Year: Never true  Transportation Needs: No Transportation Needs (05/15/2023)   PRAPARE - Administrator, Civil Service (Medical): No    Lack of Transportation (Non-Medical): No  Physical Activity: Insufficiently Active (03/11/2023)   Exercise Vital Sign    Days of Exercise per Week: 2 days    Minutes of Exercise per Session: 10 min  Stress: Stress Concern Present (12/18/2022)   [employer] of Occupational Health - Occupational Stress Questionnaire    Feeling  of Stress : Very much  Social Connections: Socially Integrated (03/11/2023)   Social Connection and Isolation Panel [NHANES]    Frequency of Communication with Friends and Family: More than three times a week    Frequency of Social Gatherings with Friends and Family: Twice a week    Attends Religious Services: More than 4 times per year    Active Member of [employer] or Organizations: Yes    Attends Banker Meetings: Never    Marital Status: Married  Catering manager Violence: Not At Risk (05/15/2023)   Humiliation, Afraid, Rape, and Kick questionnaire    Fear of Current or Ex-Partner: No    Emotionally Abused: No    Physically Abused: No    Sexually Abused: No    Review of Systems: ROS negative except for what is noted on the assessment and plan.  Vitals:   07/14/23 0830  BP: 122/64  Pulse: 79  Temp: 97.8 F (36.6 C)  TempSrc: Oral  SpO2: 100%  Weight: 207 lb 8 oz (94.1 kg)  Height: 5\' 5"  (1.651 m)    Physical Exam: Constitutional: well-appearing woman, sitting in chair , in no acute distress Eyes: conjunctiva non-erythematous Cardiovascular: regular rate and rhythm, no m/r/g Pulmonary/Chest: normal work of breathing on room air, lungs clear to auscultation bilaterally MSK: normal bulk and tone Neurological: alert & oriented x 3, no focal deficit Skin: warm and dry, cellulitis site of the left fifth toe looks clean dry and intact.  No signs of erythema and healing appropriately  Psych: normal mood and behavior  Assessment & Plan:   MDD (major depressive disorder) History of major depressive disorder.  Patient is currently not on any medication.  She denies any mood symptoms at this time, and has no suicidal ideation. -Will prescribe Zoloft  25 mg  Prediabetes Patient has a history of prediabetes.  Last hemoglobin A1c 4 months ago was 6.3.  Patient elected lifestyle modification during her last visit.  Will repeat hemoglobin A1c in office today.  If hemoglobin  A1c is stable at 6.3 or has gotten better, will  continue to counsel patient on lifestyle modification.  If hemoglobin A1c comes back higher than 6.3, I will call patient  to discuss available medical therapies. - Repeat hemoglobin A1c -Follow-up in 6 months  Cellulitis of toe of left foot Patient was recently seen in the ED and treated for cellulitis of 5th left great toe and discharged home with a 7-day course of doxycycline .  Patient endorses complete adherence to this regimen.  On exam ,the site looks clean dry and intact with  no signs of erythema.Please see image above . Patient denies any fever or chills and reports the cellulitis site has gotten much better compared to a week ago. Her CBC showed a mildly elevated white count of 12.0 - Repeat CBC   Patient discussed with Dr. Bettejane Brownie  Fay Hoop, M.D [facility] Phone: [phone] Date 07/14/2023 Time 9:20 AM

## 2023-07-14 NOTE — Assessment & Plan Note (Addendum)
 History of major depressive disorder.  Patient is currently not on any medication.  She denies any mood symptoms at this time, and has no suicidal ideation. -Will prescribe Zoloft  25 mg

## 2023-07-14 NOTE — Assessment & Plan Note (Addendum)
 Patient was recently seen in the ED and treated for cellulitis of 5th left great toe and discharged home with a 7-day course of doxycycline .  Patient endorses complete adherence to this regimen.  On exam ,the site looks clean dry and intact with no signs of erythema.Please see image above . Patient denies any fever or chills and reports the cellulitis site has gotten much better compared to a week ago. Her CBC showed a mildly elevated white count of 12.0 - Repeat CBC

## 2023-07-14 NOTE — Telephone Encounter (Signed)
 This patient is due for a Yearly mammogram and would like to sch an appointment but no order has been placed with the [facility].  Last mammogram-MM 3D SCREENING MAMMOGRAM BILATERAL BREAST (Accession [id number]) (Order [id number]) Imaging Date: 09/25/2022 Department: [facility] Released By: Saralee Cummins Authorizing: Bevelyn Bryant, MD   Can a new order be placed as she will be due in April 2025?

## 2023-07-15 ENCOUNTER — Telehealth: Payer: Self-pay | Admitting: *Deleted

## 2023-07-15 LAB — CBC
Hematocrit: 38.8 % (ref 34.0–46.6)
Hemoglobin: 12.7 g/dL (ref 11.1–15.9)
MCH: 29.7 pg (ref 26.6–33.0)
MCHC: 32.7 g/dL (ref 31.5–35.7)
MCV: 91 fL (ref 79–97)
Platelets: 317 10*3/uL (ref 150–450)
RBC: 4.28 x10E6/uL (ref 3.77–5.28)
RDW: 13.1 % (ref 11.7–15.4)
WBC: 8.5 10*3/uL (ref 3.4–10.8)

## 2023-07-15 NOTE — Telephone Encounter (Signed)
Waiting call back from patient needing information on hx so I can get her mammogram scheduled.

## 2023-07-16 ENCOUNTER — Telehealth: Payer: Self-pay | Admitting: *Deleted

## 2023-07-16 NOTE — Telephone Encounter (Signed)
Patient contacted with mammogram appointment (April 28.2025 @ 7:00 am) patient is aware of the 75.00 no show fee. Patient instructed to contact their office to cancel or reschedule appointment.

## 2023-07-17 NOTE — Addendum Note (Signed)
Addended by: Kathleen Lime on: 07/17/2023 09:09 AM   Modules accepted: Level of Service

## 2023-07-17 NOTE — Progress Notes (Signed)
Internal Medicine Clinic Attending  Case discussed with the resident at the time of the visit.  We reviewed the resident's history and exam and pertinent patient test results.  I agree with the assessment, diagnosis, and plan of care documented in the resident's note.

## 2023-07-28 ENCOUNTER — Emergency Department (HOSPITAL_COMMUNITY)
Admission: EM | Admit: 2023-07-28 | Discharge: 2023-07-28 | Discharge status: Against Medical Advice | Payer: 59 | Attending: Emergency Medicine | Admitting: Emergency Medicine

## 2023-07-28 ENCOUNTER — Emergency Department (HOSPITAL_COMMUNITY): Payer: 59

## 2023-07-28 ENCOUNTER — Encounter (HOSPITAL_COMMUNITY): Payer: Self-pay | Admitting: Emergency Medicine

## 2023-07-28 ENCOUNTER — Other Ambulatory Visit: Payer: Self-pay

## 2023-07-28 DIAGNOSIS — M7989 Other specified soft tissue disorders: Secondary | ICD-10-CM | POA: Diagnosis not present

## 2023-07-28 DIAGNOSIS — M79675 Pain in left toe(s): Secondary | ICD-10-CM | POA: Insufficient documentation

## 2023-07-28 DIAGNOSIS — M7732 Calcaneal spur, left foot: Secondary | ICD-10-CM | POA: Diagnosis not present

## 2023-07-28 DIAGNOSIS — Z5321 Procedure and treatment not carried out due to patient leaving prior to being seen by health care provider: Secondary | ICD-10-CM | POA: Diagnosis not present

## 2023-07-28 DIAGNOSIS — M79672 Pain in left foot: Secondary | ICD-10-CM | POA: Diagnosis not present

## 2023-07-28 NOTE — ED Triage Notes (Signed)
 Pt in with L 5th toe pain, states she was bitten by a bug in January and took abx for cellulitis most recently 2/10. Pt states the pain and swelling is worse

## 2023-07-28 NOTE — ED Notes (Signed)
Pt left AMA due to wait 

## 2023-07-29 ENCOUNTER — Ambulatory Visit (INDEPENDENT_AMBULATORY_CARE_PROVIDER_SITE_OTHER): Payer: 59 | Admitting: Internal Medicine

## 2023-07-29 VITALS — BP 121/70 | HR 79 | Temp 98.0°F | Ht 65.0 in | Wt 203.6 lb

## 2023-07-29 DIAGNOSIS — L03032 Cellulitis of left toe: Secondary | ICD-10-CM

## 2023-07-29 DIAGNOSIS — E785 Hyperlipidemia, unspecified: Secondary | ICD-10-CM | POA: Diagnosis not present

## 2023-07-29 MED ORDER — CEPHALEXIN 500 MG PO CAPS: 500.0000 mg | ORAL_CAPSULE | Freq: Four times a day (QID) | ORAL | 0 refills | Status: DC

## 2023-07-29 NOTE — Progress Notes (Signed)
 Established Patient Office Visit  Subjective   Patient ID: Theresa Kirk, female    DOB: 1960-09-12  Age: 63 y.o. MRN: [id number]  Chief Complaint  Patient presents with   Toe Pain    Pt states two weeks ago something bite her on the left toe. Pain are moving toe to toe.     Theresa Kirk comes to clinic today for acute visit to f/u left toe infection. Please see assessment/plan in problem-based charting for further details of today's visit.    Patient Active Problem List   Diagnosis Date Noted   MDD (major depressive disorder) 07/14/2023   Cellulitis of toe of left foot 07/14/2023   Cataract, nuclear sclerotic, both eyes 03/04/2023   Restless leg syndrome 08/12/2022   Screening mammogram for breast cancer 08/12/2022   Peripheral polyneuropathy 08/12/2022   Foot pain, left 08/12/2022   Porokeratosis 10/02/2021   Leg cramping 09/18/2021   Sensorineural hearing loss (SNHL) of both ears 01/25/2020   Tinnitus 09/21/2019   Hot flashes 04/20/2019   Numerous moles 04/20/2019   Back pain 07/29/2017   Feeling of incomplete bladder emptying 02/27/2017   Prediabetes 09/29/2014   Bilateral ocular hypertension 06/27/2014   Cervical pain (neck) 05/05/2014   Chronic cough 12/09/2012   Seasonal allergies 09/23/2012   Healthcare maintenance 04/13/2012   Bilateral knee pain 03/26/2012   Carpal tunnel syndrome 06/13/2010   Callus of foot 06/07/2008   Hyperlipidemia 11/06/2006      Objective:     BP 121/70 (BP Location: Right Arm, Patient Position: Sitting, Cuff Size: Normal)   Pulse 79   Temp 98 F (36.7 C) (Oral)   Ht 5\' 5"  (1.651 m)   Wt 203 lb 9.6 oz (92.4 kg)   LMP 09/14/2006   SpO2 99%   BMI 33.88 kg/m  BP Readings from Last 3 Encounters:  07/29/23 121/70  07/28/23 130/83  07/14/23 122/64   Wt Readings from Last 3 Encounters:  07/29/23 203 lb 9.6 oz (92.4 kg)  07/28/23 207 lb 8 oz (94.1 kg)  07/14/23 207 lb 8 oz (94.1 kg)     Physical Exam Constitutional:       Appearance: Normal appearance. She is not ill-appearing or toxic-appearing.  Pulmonary:     Effort: Pulmonary effort is normal.  Skin:    General: Skin is warm.     Comments: See picture below  Neurological:     General: No focal deficit present.     Mental Status: She is alert.  Psychiatric:        Mood and Affect: Mood normal.        Behavior: Behavior normal.         Assessment & Plan:   Problem List Items Addressed This Visit       Other   Hyperlipidemia - Primary   Elevated LDL on past lipid panels. Previously prescribed high-intensity statin, however Theresa Kirk was not taking this as she changed her diet and reported prior provider told her it was okay to stop taking this. Last ASCVD risk score was low, statin not resumed. We will recheck lipid panel today and discuss resuming statin if needed.  Plan -Lipid panel       Relevant Orders   Lipid Profile   Cellulitis of toe of left foot   Seen in the ED 1/30 for left toe cellulitis following what she thinks was an insect bite. She woke up with a sharp pain in the left fifth toe followed by swelling, pain,  and drainage. Treated with 7 days of doxycycline, improved some but Theresa Kirk has since noticed swelling, pain, and drainage from between the left fourth/fifth toes.   On exam, she does have swelling and tenderness of the left fourth toe. There is an area of skin breakdown and skin tear between the toes, no active drainage. Skin peeling and irritation between the fourth/fifth toes and at the ball of the foot. Overall appears to be mild infection. Discussed 5 day course of cephalexin with warm soaks. Keep the area dry but avoid skin irritants such as alcohol or creams for now. Return precautions provided.  Plan -Cephalexin 500 mg qid for 5 days -Local skin care -Return if worsening or no improvement      Relevant Medications   cephALEXin (KEFLEX) 500 MG capsule    Return in about 3 months (around 10/26/2023).     Dickie La, MD

## 2023-07-29 NOTE — Assessment & Plan Note (Signed)
 Elevated LDL on past lipid panels. Previously prescribed high-intensity statin, however Theresa Kirk was not taking this as she changed her diet and reported prior provider told her it was okay to stop taking this. Last ASCVD risk score was low, statin not resumed. We will recheck lipid panel today and discuss resuming statin if needed.  Plan -Lipid panel

## 2023-07-29 NOTE — Assessment & Plan Note (Signed)
 Seen in the ED 1/30 for left toe cellulitis following what she thinks was an insect bite. She woke up with a sharp pain in the left fifth toe followed by swelling, pain, and drainage. Treated with 7 days of doxycycline, improved some but Theresa Kirk has since noticed swelling, pain, and drainage from between the left fourth/fifth toes.   On exam, she does have swelling and tenderness of the left fourth toe. There is an area of skin breakdown and skin tear between the toes, no active drainage. Skin peeling and irritation between the fourth/fifth toes and at the ball of the foot. Overall appears to be mild infection. Discussed 5 day course of cephalexin with warm soaks. Keep the area dry but avoid skin irritants such as alcohol or creams for now. Return precautions provided.  Plan -Cephalexin 500 mg qid for 5 days -Local skin care -Return if worsening or no improvement

## 2023-07-30 LAB — LIPID PANEL
Chol/HDL Ratio: 5 {ratio} — ABNORMAL HIGH (ref 0.0–4.4)
Cholesterol, Total: 231 mg/dL — ABNORMAL HIGH (ref 100–199)
HDL: 46 mg/dL (ref >39)
LDL Chol Calc (NIH): 167 mg/dL — ABNORMAL HIGH (ref 0–99)
Triglycerides: 103 mg/dL (ref 0–149)
VLDL Cholesterol Cal: 18 mg/dL (ref 5–40)

## 2023-07-30 MED ORDER — ROSUVASTATIN CALCIUM 10 MG PO TABS: 10.0000 mg | ORAL_TABLET | Freq: Every day | ORAL | 3 refills | Status: AC

## 2023-07-30 NOTE — Progress Notes (Signed)
 The 10-year ASCVD risk score (Arnett DK, et al., 2019) is: 6.1%. LDL remains >160, recommend starting moderate intensity statin. Rosuvastatin 10 mg sent to patient pharmacy, discussed with Ms.  Craigie 2/26.

## 2023-07-30 NOTE — Addendum Note (Signed)
 Addended by: Dickie La on: 07/30/2023 12:55 PM   Modules accepted: Orders

## 2023-08-01 ENCOUNTER — Telehealth: Payer: Self-pay | Admitting: Internal Medicine

## 2023-08-01 ENCOUNTER — Ambulatory Visit: Payer: 59 | Admitting: Student

## 2023-08-01 DIAGNOSIS — L03119 Cellulitis of unspecified part of limb: Secondary | ICD-10-CM

## 2023-08-01 DIAGNOSIS — T50905A Adverse effect of unspecified drugs, medicaments and biological substances, initial encounter: Secondary | ICD-10-CM | POA: Diagnosis not present

## 2023-08-01 DIAGNOSIS — L03032 Cellulitis of left toe: Secondary | ICD-10-CM

## 2023-08-01 MED ORDER — LINEZOLID 600 MG PO TABS: 600.0000 mg | ORAL_TABLET | Freq: Two times a day (BID) | ORAL | 0 refills | Status: DC

## 2023-08-01 NOTE — Progress Notes (Addendum)
  [facility] Internal Medicine Residency Telephone Encounter Continuity Care Appointment  HPI:  This telephone encounter was created for [name] on 08/01/2023 for the following purpose/cc allergy to cephalexin.   Started taking Cefalexin as prescribed but then started developing, "little bity bumps", erythematous and pus-filled. They itch. She took benadryl and it helped but the itching is still there. She did not take any more cephalexin, the last time she took it was at 1:30pm 1/27. Denies any tongue swelling, any difficulty breathing, wheezing, any diarrhea, cramping, heart racing or lower extremity edema.      Past Medical History:  Past Medical History:  Diagnosis Date   Abnormal mammogram with microcalcification 10/15/2013   Acquired hallux limitus of both feet 10/15/2019   Acute pain of right wrist 02/03/2018   Alcohol abuse    Arthritis    Asthma    Callus of foot 06/07/2008   Qualifier: Diagnosis of  By: Comer Locket MD, Vijay     Carpal tunnel syndrome    Cervical lymphadenopathy 09/21/2019   Cervical muscle pain 05/05/2014   - Pt with cervical pain radiating down her neck with no signs of radiculopathy or weakness in upper extremities   Depression    Fibromyalgia    Healthcare maintenance 04/13/2012   High cholesterol    Irritation symptom of skin 12/30/2017     ROS:  Negative unless stated in the HPI   Assessment / Plan / Recommendations:  Adverse effect of drug Likely allergic due to itching, but could also be AGEP given the timing of her taking Cephalexin and now having pustules that are itchy. Instructed to stop the drug, but she still had LE cellulitis. Will switch to 5 of linezolid.   As always, pt is advised that if symptoms worsen or new symptoms arise, they should go to an urgent care facility or to to ER for further evaluation.   Consent and Medical Decision Making:  Patient discussed with Dr. Cleda Daub This is a telephone encounter between  [name] and Manuela Neptune ,MD on 08/01/2023 for drug reaction. The visit was conducted with the patient located at home and [place] ,MD at [facility]. The patient's identity was confirmed using their DOB and current address. The patient has consented to being evaluated through a telephone encounter and understands the associated risks (an examination cannot be done and the patient may need to come in for an appointment) / benefits (allows the patient to remain at home, decreasing exposure to coronavirus). I personally spent 15 minutes on medical discussion.

## 2023-08-01 NOTE — Telephone Encounter (Signed)
 Received operator notification that patient is requesting a return call. She explains that the bumps discussed during her telephone visit today have increased and number and are very itchy. She has taken benadryl once, around 10A. She has not had any lip, tongue, or face swelling, no trouble swallowing or feeling like her throat is closing. She has not taken the offending antibiotic since yesterday.  I advised that she take benadryl again and that she may need additional dose at bedtime. I explained that her body may need more time to clear the offending medication from her body. She has been advised to go straight to the ED if she develops any face, tongue, or lip swelling or trouble breathing, which she agrees to.  Champ Mungo, DO Internal Medicine PGY-3

## 2023-08-02 ENCOUNTER — Encounter (HOSPITAL_COMMUNITY): Payer: Self-pay

## 2023-08-02 ENCOUNTER — Ambulatory Visit (HOSPITAL_COMMUNITY)
Admission: EM | Admit: 2023-08-02 | Discharge: 2023-08-02 | Disposition: A | Discharge status: Home / Self Care | Attending: Emergency Medicine | Admitting: Emergency Medicine

## 2023-08-02 DIAGNOSIS — T7840XA Allergy, unspecified, initial encounter: Secondary | ICD-10-CM | POA: Diagnosis not present

## 2023-08-02 DIAGNOSIS — R21 Rash and other nonspecific skin eruption: Secondary | ICD-10-CM | POA: Diagnosis not present

## 2023-08-02 MED ORDER — PREDNISONE 10 MG PO TABS
ORAL_TABLET | ORAL | 0 refills | Status: DC
Start: 2023-08-02 — End: 2023-09-23

## 2023-08-02 NOTE — ED Provider Notes (Signed)
 [facility]    CSN: [id number] Arrival date & time: 08/02/23  1713      History   Chief Complaint Chief Complaint  Patient presents with   Rash    HPI Theresa Kirk is a 63 y.o. female.   Patient presents with rash all over body after taking Keflex earlier this week. Patient states that she has since stopped taking the Keflex, but the rash persists. Patient reports she has been taking Benadryl with relief of itching.    Rash   Past Medical History:  Diagnosis Date   Abnormal mammogram with microcalcification 10/15/2013   Acquired hallux limitus of both feet 10/15/2019   Acute pain of right wrist 02/03/2018   Alcohol abuse    Arthritis    Asthma    Callus of foot 06/07/2008   Qualifier: Diagnosis of  By: Comer Locket MD, Vijay     Carpal tunnel syndrome    Cervical lymphadenopathy 09/21/2019   Cervical muscle pain 05/05/2014   - Pt with cervical pain radiating down her neck with no signs of radiculopathy or weakness in upper extremities   Depression    Fibromyalgia    Healthcare maintenance 04/13/2012   High cholesterol    Irritation symptom of skin 12/30/2017    Patient Active Problem List   Diagnosis Date Noted   MDD (major depressive disorder) 07/14/2023   Cellulitis of toe of left foot 07/14/2023   Cataract, nuclear sclerotic, both eyes 03/04/2023   Restless leg syndrome 08/12/2022   Screening mammogram for breast cancer 08/12/2022   Peripheral polyneuropathy 08/12/2022   Foot pain, left 08/12/2022   Porokeratosis 10/02/2021   Leg cramping 09/18/2021   Sensorineural hearing loss (SNHL) of both ears 01/25/2020   Tinnitus 09/21/2019   Hot flashes 04/20/2019   Numerous moles 04/20/2019   Back pain 07/29/2017   Feeling of incomplete bladder emptying 02/27/2017   Prediabetes 09/29/2014   Bilateral ocular hypertension 06/27/2014   Cervical pain (neck) 05/05/2014   Chronic cough 12/09/2012   Seasonal allergies 09/23/2012   Healthcare maintenance  04/13/2012   Bilateral knee pain 03/26/2012   Carpal tunnel syndrome 06/13/2010   Callus of foot 06/07/2008   Hyperlipidemia 11/06/2006    Past Surgical History:  Procedure Laterality Date   ABDOMINAL HYSTERECTOMY  2008   h/o uterine fibroids, ?partial hysterectomy per pt 04/13/12   CARPAL TUNNEL RELEASE Right 01/05/2021   Procedure: RIGHT CARPAL TUNNEL RELEASE;  Surgeon: Betha Loa, MD;  Location: [facility];  Service: Orthopedics;  Laterality: Right;  30 MIN   COLONOSCOPY     FOOT SURGERY Right    KNEE SURGERY      OB History     Gravida  4   Para  3   Term  3   Preterm  0   AB  1   Living  3      SAB  0   IAB  1   Ectopic  0   Multiple  0   Live Births               Home Medications    Prior to Admission medications   Medication Sig Start Date End Date Taking? Authorizing Provider  predniSONE (DELTASONE) 10 MG tablet Take 4 tabs by mouth daily for 3 days, then 3 tabs for 3 days, then 2 tabs for 2 days, then 1 tab for 2 days 08/02/23  Yes Wynonia Lawman A, NP  albuterol (VENTOLIN HFA) 108 (90 Base) MCG/ACT inhaler  Inhale 1-2 puffs into the lungs every 6 (six) hours as needed for wheezing or shortness of breath. 05/16/21   Earl Lagos, MD  Cetirizine HCl 10 MG CAPS Take 1 capsule (10 mg total) by mouth daily. 05/22/22   Katsadouros, Vasilios, MD  gabapentin (NEURONTIN) 100 MG capsule Take 1 capsule (100 mg total) by mouth 3 (three) times daily. 09/09/22   Dickie La, MD  linezolid (ZYVOX) 600 MG tablet Take 1 tablet (600 mg total) by mouth 2 (two) times daily. 08/01/23   Alexander-Savino, [place], MD  meloxicam (MOBIC) 15 MG tablet Take 1 tablet (15 mg total) by mouth daily. 08/27/22   Felecia Shelling, DPM  pantoprazole (PROTONIX) 40 MG tablet TAKE 1 TABLET BY MOUTH EVERY DAY(TAKE 30 MINS BEFORE MEALS) 05/22/22   Katsadouros, Vasilios, MD  rosuvastatin (CRESTOR) 10 MG tablet Take 1 tablet (10 mg total) by mouth daily. 07/30/23 07/29/24   Dickie La, MD  sertraline (ZOLOFT) 25 MG tablet Take 1 tablet (25 mg total) by mouth daily. 07/14/23 07/13/24  Kathleen Lime, MD    Family History Family History  Problem Relation Age of Onset   Diabetes Mother    Hypertension Mother    Stroke Mother    Diabetes Sister    Thyroid disease Sister    Hypertension Sister    Cancer Maternal Aunt        unknown   Breast cancer Maternal Aunt     Social History Social History   Tobacco Use   Smoking status: Former    Current packs/day: 0.00    Types: Cigarettes    Quit date: 06/03/2000    Years since quitting: 23.1   Smokeless tobacco: Never  Vaping Use   Vaping status: Never Used  Substance Use Topics   Alcohol use: Not Currently   Drug use: Yes    Types: Marijuana     Allergies   Iohexol, Ibuprofen, and Cephalexin   Review of Systems Review of Systems  Skin:  Positive for rash.   Per HPI  Physical Exam Triage Vital Signs ED Triage Vitals  Encounter Vitals Group     BP 08/02/23 1736 116/76     Systolic BP Percentile --      Diastolic BP Percentile --      Pulse Rate 08/02/23 1736 92     Resp 08/02/23 1736 16     Temp 08/02/23 1736 98.5 F (36.9 C)     Temp Source 08/02/23 1736 Oral     SpO2 08/02/23 1736 95 %     Weight 08/02/23 1731 203 lb (92.1 kg)     Height 08/02/23 1731 5\' 5"  (1.651 m)     Head Circumference --      Peak Flow --      Pain Score 08/02/23 1731 0     Pain Loc --      Pain Education --      Exclude from Growth Chart --    No data found.  Updated Vital Signs BP 116/76 (BP Location: Left Arm)   Pulse 92   Temp 98.5 F (36.9 C) (Oral)   Resp 16   Ht 5\' 5"  (1.651 m)   Wt 203 lb (92.1 kg)   LMP 09/14/2006   SpO2 95%   BMI 33.78 kg/m   Visual Acuity Right Eye Distance:   Left Eye Distance:   Bilateral Distance:    Right Eye Near:   Left Eye Near:    Bilateral Near:  Physical Exam Vitals and nursing note reviewed.  Constitutional:      General: She is awake. She is  not in acute distress.    Appearance: Normal appearance. She is well-developed and well-groomed. She is not ill-appearing.  Skin:    Findings: Rash present. Rash is macular and papular.  Neurological:     Mental Status: She is alert.  Psychiatric:        Behavior: Behavior is cooperative.      UC Treatments / Results  Labs (all labs ordered are listed, but only abnormal results are displayed) Labs Reviewed - No data to display  EKG   Radiology No results found.  Procedures Procedures (including critical care time)  Medications Ordered in UC Medications - No data to display  Initial Impression / Assessment and Plan / UC Course  I have reviewed the triage vital signs and the nursing notes.  Pertinent labs & imaging results that were available during my care of the patient were reviewed by me and considered in my medical decision making (see chart for details).     Upon assessment maculopapular rash noted to bilateral arms, legs, and trunk. No other significant findings.  Prescribed prednisone for rash. Discussed taking OTC antihistamines to help with itching. Discussed follow-up and return precautions.  Final Clinical Impressions(s) / UC Diagnoses   Final diagnoses:  Rash and nonspecific skin eruption  Allergic reaction, initial encounter     Discharge Instructions      Sart taking prednisone as prescribed to help with rash. Otherwise you can take daily allergy medication such as Zyrtec or Claritin as needed for rash and itching. You can also take Benadryl as needed for itching. This can make you drowsy so do not work, drive, or drink while taking. Return here or follow-up with primary care provider if symptoms persist.    ED Prescriptions     Medication Sig Dispense Auth. Provider   predniSONE (DELTASONE) 10 MG tablet Take 4 tabs by mouth daily for 3 days, then 3 tabs for 3 days, then 2 tabs for 2 days, then 1 tab for 2 days 28 tablet Wynonia Lawman A, NP       PDMP not reviewed this encounter.   Wynonia Lawman A, NP 08/02/23 1820

## 2023-08-02 NOTE — Discharge Instructions (Signed)
 Sart taking prednisone as prescribed to help with rash. Otherwise you can take daily allergy medication such as Zyrtec or Claritin as needed for rash and itching. You can also take Benadryl as needed for itching. This can make you drowsy so do not work, drive, or drink while taking. Return here or follow-up with primary care provider if symptoms persist.

## 2023-08-02 NOTE — ED Triage Notes (Signed)
 Patient here today with c/o itchy rash all over her body since Monday or Tuesday after taking Cephalexin. Patient stopped taking the medication but still has the rash.

## 2023-08-03 DIAGNOSIS — T7840XA Allergy, unspecified, initial encounter: Secondary | ICD-10-CM | POA: Insufficient documentation

## 2023-08-03 DIAGNOSIS — T50905A Adverse effect of unspecified drugs, medicaments and biological substances, initial encounter: Secondary | ICD-10-CM | POA: Insufficient documentation

## 2023-08-03 HISTORY — DX: Allergy, unspecified, initial encounter: T78.40XA

## 2023-08-03 HISTORY — DX: Adverse effect of unspecified drugs, medicaments and biological substances, initial encounter: T50.905A

## 2023-08-03 NOTE — Assessment & Plan Note (Signed)
 Likely allergic due to itching, but could also be AGEP given the timing of her taking Cephalexin and now having pustules that are itchy. Instructed to stop the drug, but she still had LE cellulitis. Will switch to 5 of linezolid.

## 2023-08-05 ENCOUNTER — Telehealth: Payer: Self-pay | Admitting: *Deleted

## 2023-08-05 NOTE — Telephone Encounter (Signed)
 Call from pt who stated she's still itching and has "bumps" on her body. She was prescribed Cephalexin on 2/25; developed rash,bumps,itching. Had a telehealth visit on 2/28, abx changed to Linezolid. She continued to itch, went to UC on 3/1 - received rx for Prednisone in which she has not started taking yet. Stated she's unsure if the reaction is from the 2nd abx or the first one. She wants to know why she's still itching and has the bumpy rash. Afternoon appt given to pt tomorrow 3/4 with Dr Annie Paras @ 1415PM.

## 2023-08-06 ENCOUNTER — Ambulatory Visit (INDEPENDENT_AMBULATORY_CARE_PROVIDER_SITE_OTHER): Admitting: Student

## 2023-08-06 VITALS — BP 123/73 | HR 64 | Temp 97.7°F | Ht 65.0 in | Wt 205.7 lb

## 2023-08-06 DIAGNOSIS — L03032 Cellulitis of left toe: Secondary | ICD-10-CM | POA: Diagnosis not present

## 2023-08-06 DIAGNOSIS — T7840XA Allergy, unspecified, initial encounter: Secondary | ICD-10-CM | POA: Diagnosis not present

## 2023-08-06 NOTE — Progress Notes (Unsigned)
 CC: Follow-up  HPI:  [name] is a 63 y.o. female living with a history stated below and presents today for follow-up. Please see problem based assessment and plan for additional details.  Past Medical History:  Diagnosis Date   Abnormal mammogram with microcalcification 10/15/2013   Acquired hallux limitus of both feet 10/15/2019   Acute pain of right wrist 02/03/2018   Alcohol abuse    Arthritis    Asthma    Callus of foot 06/07/2008   Qualifier: Diagnosis of  By: Theresa Locket MD, Theresa Kirk     Carpal tunnel syndrome    Cervical lymphadenopathy 09/21/2019   Cervical muscle pain 05/05/2014   - Pt with cervical pain radiating down her neck with no signs of radiculopathy or weakness in upper extremities   Depression    Fibromyalgia    Healthcare maintenance 04/13/2012   High cholesterol    Irritation symptom of skin 12/30/2017    Current Outpatient Medications on File Prior to Visit  Medication Sig Dispense Refill   albuterol (VENTOLIN HFA) 108 (90 Base) MCG/ACT inhaler Inhale 1-2 puffs into the lungs every 6 (six) hours as needed for wheezing or shortness of breath. 18 g 0   Cetirizine HCl 10 MG CAPS Take 1 capsule (10 mg total) by mouth daily. 60 capsule 0   gabapentin (NEURONTIN) 100 MG capsule Take 1 capsule (100 mg total) by mouth 3 (three) times daily. 90 capsule 0   linezolid (ZYVOX) 600 MG tablet Take 1 tablet (600 mg total) by mouth 2 (two) times daily. 10 tablet 0   meloxicam (MOBIC) 15 MG tablet Take 1 tablet (15 mg total) by mouth daily. 30 tablet 1   pantoprazole (PROTONIX) 40 MG tablet TAKE 1 TABLET BY MOUTH EVERY DAY(TAKE 30 MINS BEFORE MEALS) 90 tablet 1   predniSONE (DELTASONE) 10 MG tablet Take 4 tabs by mouth daily for 3 days, then 3 tabs for 3 days, then 2 tabs for 2 days, then 1 tab for 2 days 28 tablet 0   rosuvastatin (CRESTOR) 10 MG tablet Take 1 tablet (10 mg total) by mouth daily. 90 tablet 3   sertraline (ZOLOFT) 25 MG tablet Take 1 tablet (25 mg  total) by mouth daily. 30 tablet 2   No current facility-administered medications on file prior to visit.    Family History  Problem Relation Age of Onset   Diabetes Mother    Hypertension Mother    Stroke Mother    Diabetes Sister    Thyroid disease Sister    Hypertension Sister    Cancer Maternal Aunt        unknown   Breast cancer Maternal Aunt     Social History   Socioeconomic History   Marital status: Single    Spouse name: Not on file   Number of children: Not on file   Years of education: Not on file   Highest education level: Not on file  Occupational History   Not on file  Tobacco Use   Smoking status: Former    Current packs/day: 0.00    Types: Cigarettes    Quit date: 06/03/2000    Years since quitting: 23.1   Smokeless tobacco: Never  Vaping Use   Vaping status: Never Used  Substance and Sexual Activity   Alcohol use: Not Currently   Drug use: Yes    Types: Marijuana   Sexual activity: Not on file  Other Topics Concern   Not on file  Social History Narrative  Lives with mother in [place].   Currently unemployed.   Used to work at [employer] in [place].   ETOH use: weekend, 1-2 beers from [employer]: used to drink everyday   Former smoker   Current THC use   H/o cocaine use: quit in 2001.   Social Drivers of Corporate investment banker Strain: Low Risk  (12/18/2022)   Overall Financial Resource Strain (CARDIA)    Difficulty of Paying Living Expenses: Not very hard  Food Insecurity: Food Insecurity Present (05/15/2023)   Hunger Vital Sign    Worried About Running Out of Food in the Last Year: Sometimes true    Ran Out of Food in the Last Year: Never true  Transportation Needs: No Transportation Needs (05/15/2023)   PRAPARE - Administrator, Civil Service (Medical): No    Lack of Transportation (Non-Medical): No  Physical Activity: Insufficiently Active (03/11/2023)   Exercise Vital Sign    Days of Exercise per Week: 2 days     Minutes of Exercise per Session: 10 min  Stress: Stress Concern Present (12/18/2022)   [employer] of Occupational Health - Occupational Stress Questionnaire    Feeling of Stress : Very much  Social Connections: Socially Integrated (03/11/2023)   Social Connection and Isolation Panel [NHANES]    Frequency of Communication with Friends and Family: More than three times a week    Frequency of Social Gatherings with Friends and Family: Twice a week    Attends Religious Services: More than 4 times per year    Active Member of [employer] or Organizations: Yes    Attends Banker Meetings: Never    Marital Status: Married  Catering manager Violence: Not At Risk (05/15/2023)   Humiliation, Afraid, Rape, and Kick questionnaire    Fear of Current or Ex-Partner: No    Emotionally Abused: No    Physically Abused: No    Sexually Abused: No    Review of Systems: ROS negative except for what is noted on the assessment and plan.  Vitals:   08/06/23 1403  BP: 123/73  Pulse: 64  Temp: 97.7 F (36.5 C)  TempSrc: Oral  SpO2: 100%  Weight: 205 lb 11.2 oz (93.3 kg)  Height: 5\' 5"  (1.651 m)    Physical Exam: Constitutional: well-appearing, sitting in chair, in no acute distress Cardiovascular: regular rate and rhythm, no m/r/g Pulmonary/Chest: normal work of breathing on room air, lungs clear to auscultation bilaterally Abdominal: soft, non-tender, non-distended MSK: normal bulk and tone Skin: warm and dry Psych: normal mood and behavior  Assessment & Plan:     Patient {GC/[id number]::"discussed with","seen with"} Dr. {[id number]::"OZDGUYQI","H. Hoffman","Mullen","Narendra","Vincent","Guilloud","Lau","Machen"}  No problem-specific Assessment & Plan notes found for this encounter.   Theresa Kirk, D.O. [facility] Internal Medicine, PGY-1 Phone: [phone] Date 08/06/2023 Time 2:38 PM

## 2023-08-07 NOTE — Assessment & Plan Note (Signed)
 Seen in the ED 1/30 for left toe cellulitis following what she thinks was an insect bite.  Per above was transition to linezolid and she has 3 days remaining.  Denies fever, chills, and states that symptoms have improved significantly.  Return precautions given.

## 2023-08-07 NOTE — Assessment & Plan Note (Addendum)
 Here for follow-up for diffuse rash that began a few hours after taking Cephalexin for LE cellulitis. She had a telehealth appointment 2/28 in which she was instructed to discontinue cephalexin and was prescribed linezolid.  Since then the rash has improved but she continues to have pruritus.  She was seen in the ED for this 3/1 and was prescribed prednisone but she has not started this.  She has used oral Benadryl and numerous topical agents including calamine lotion and topical Benadryl.  I instructed her to pick up the prednisone today and she will call the clinic if symptoms do not improve by next week.

## 2023-08-12 NOTE — Progress Notes (Signed)
 Internal Medicine Clinic Attending  Case discussed with the resident at the time of the visit.  We reviewed the resident's history and exam and pertinent patient test results.  I agree with the assessment, diagnosis, and plan of care documented in the resident's note.

## 2023-08-17 DIAGNOSIS — L0291 Cutaneous abscess, unspecified: Secondary | ICD-10-CM | POA: Diagnosis not present

## 2023-08-17 DIAGNOSIS — R300 Dysuria: Secondary | ICD-10-CM | POA: Diagnosis not present

## 2023-08-17 DIAGNOSIS — N3001 Acute cystitis with hematuria: Secondary | ICD-10-CM | POA: Diagnosis not present

## 2023-08-21 ENCOUNTER — Ambulatory Visit (HOSPITAL_COMMUNITY)
Admission: RE | Admit: 2023-08-21 | Discharge: 2023-08-21 | Disposition: A | Discharge status: Home / Self Care | Source: Ambulatory Visit | Attending: Internal Medicine | Admitting: Internal Medicine

## 2023-08-21 ENCOUNTER — Ambulatory Visit: Admitting: Student

## 2023-08-21 VITALS — BP 130/68 | HR 82 | Temp 98.1°F | Wt 203.8 lb

## 2023-08-21 DIAGNOSIS — B353 Tinea pedis: Secondary | ICD-10-CM | POA: Diagnosis not present

## 2023-08-21 DIAGNOSIS — L03032 Cellulitis of left toe: Secondary | ICD-10-CM

## 2023-08-21 DIAGNOSIS — M7732 Calcaneal spur, left foot: Secondary | ICD-10-CM | POA: Diagnosis not present

## 2023-08-21 MED ORDER — CLOTRIMAZOLE 1 % EX CREA: 1.0000 | TOPICAL_CREAM | Freq: Two times a day (BID) | CUTANEOUS | 1 refills | Status: DC

## 2023-08-21 NOTE — Assessment & Plan Note (Addendum)
 This is a 63 year old female with multiple presentations for left foot cellulitis.  She first presented to [facility] on 07/03/2023 with the symptoms.  She has been on various antibiotic regimens including doxycycline, cephalosporin regiment for which she had allergic reaction, linezolid, and now on Bactrim.  She recent presented to outside urgent care with concerns of UTI, gluteal boil, and ongoing cellulitis.  She was given mupirocin to place in her nares for presumed MRSA colonization, and was placed on 10-day course of Bactrim to treat her multiple infections.  Per chart review, she should have finished her linezolid on 08/09/2023.  She started having increased swelling and pain at that site following this.  She was then placed on Bactrim at the urgent care, after which she had improvement of her pain and swelling.  She does endorse purulent discharge from between the toes.    On physical exam there is a lesion which may represent a sinus tract between the toes.  Slight tender to palpation of the fifth digit, she is able to move the fourth and fifth digits without significant pain.  There is some swelling of the fourth and fifth digit.  Given the chronicity of her symptoms, continued purulent drainage, and exam findings today.  I do have concern for ongoing infection or deep infection.  She did have plain films at time of original presentation in January which were negative for osteomyelitis.  I do think it be worthwhile to repeat these and would consider MR imaging should these be negative.  I also think she would benefit from seeing podiatry.  I will extend her antibiotic course to 14 days for additional coverage.

## 2023-08-21 NOTE — Progress Notes (Unsigned)
 Subjective:  CC: Toe problems  HPI:  Theresa Kirk is a 63 y.o. person with a past medical history stated below and presents today for the stated chief complaint. Please see problem based assessment and plan for additional details.  Past Medical History:  Diagnosis Date   Abnormal mammogram with microcalcification 10/15/2013   Acquired hallux limitus of both feet 10/15/2019   Acute pain of right wrist 02/03/2018   Alcohol abuse    Arthritis    Asthma    Callus of foot 06/07/2008   Qualifier: Diagnosis of  By: Comer Locket MD, Vijay     Carpal tunnel syndrome    Cervical lymphadenopathy 09/21/2019   Cervical muscle pain 05/05/2014   - Pt with cervical pain radiating down her neck with no signs of radiculopathy or weakness in upper extremities   Depression    Fibromyalgia    Healthcare maintenance 04/13/2012   High cholesterol    Irritation symptom of skin 12/30/2017    Current Outpatient Medications on File Prior to Visit  Medication Sig Dispense Refill   albuterol (VENTOLIN HFA) 108 (90 Base) MCG/ACT inhaler Inhale 1-2 puffs into the lungs every 6 (six) hours as needed for wheezing or shortness of breath. 18 g 0   Cetirizine HCl 10 MG CAPS Take 1 capsule (10 mg total) by mouth daily. 60 capsule 0   gabapentin (NEURONTIN) 100 MG capsule Take 1 capsule (100 mg total) by mouth 3 (three) times daily. 90 capsule 0   linezolid (ZYVOX) 600 MG tablet Take 1 tablet (600 mg total) by mouth 2 (two) times daily. 10 tablet 0   meloxicam (MOBIC) 15 MG tablet Take 1 tablet (15 mg total) by mouth daily. 30 tablet 1   pantoprazole (PROTONIX) 40 MG tablet TAKE 1 TABLET BY MOUTH EVERY DAY(TAKE 30 MINS BEFORE MEALS) 90 tablet 1   predniSONE (DELTASONE) 10 MG tablet Take 4 tabs by mouth daily for 3 days, then 3 tabs for 3 days, then 2 tabs for 2 days, then 1 tab for 2 days 28 tablet 0   rosuvastatin (CRESTOR) 10 MG tablet Take 1 tablet (10 mg total) by mouth daily. 90 tablet 3   sertraline  (ZOLOFT) 25 MG tablet Take 1 tablet (25 mg total) by mouth daily. 30 tablet 2   No current facility-administered medications on file prior to visit.    Review of Systems: Please see assessment and plan for pertinent positives and negatives.  Objective:   Vitals:   08/21/23 1413  BP: 130/68  Pulse: 82  Temp: 98.1 F (36.7 C)  TempSrc: Oral  SpO2: 97%  Weight: 203 lb 12.8 oz (92.4 kg)    Physical Exam: Constitutional: Well-appearing Cardiovascular: Regular rate and rhythm Pulmonary/Chest: lungs clear to auscultation bilaterally Extremities: Kindly see media tab for photos.  Maceration between the fourth and fifth toe.  Some edema of the fifth toe.  Mild tenderness to palpation of the fifth toe, otherwise no pain with motion, erythema, or frank purulence appreciated. Psych: Pleasant affect Thought process is linear and is goal-directed.     Assessment & Plan:  Cellulitis of toe of left foot This is a 63 year old female with multiple presentations for left foot cellulitis.  She first presented to [facility] on 07/03/2023 with the symptoms.  She has been on various antibiotic regimens including doxycycline, cephalosporin regiment for which she had allergic reaction, linezolid, and now on Bactrim.  She recent presented to outside urgent care with concerns of UTI, gluteal boil,  and ongoing cellulitis.  She was given mupirocin to place in her nares for presumed MRSA colonization, and was placed on 10-day course of Bactrim to treat her multiple infections.  Per chart review, she should have finished her linezolid on 08/09/2023.  She started having increased swelling and pain at that site following this.  She was then placed on Bactrim at the urgent care, after which she had improvement of her pain and swelling.  She does endorse purulent discharge from between the toes earlier this week, and also endorses a great deal of itching.  On physical exam there is maceration between  the fourth and fifth toes.  There is a skin defect, wonder if this may represent a sinus tract.  Slight tender to palpation of the fifth digit, she is able to move the fourth and fifth digits without significant pain.  There is some swelling of the fourth and fifth digit.  Given the chronicity of her symptoms, continued purulent drainage, and exam findings today.  I do have some concern for ongoing infection or deep infection.  She did have plain films at time of original presentation in January which were negative for osteomyelitis.  I do think it be worthwhile to repeat these today.   I believe that the patient likely has tinea pedis and interdigital tinea infection which may be the reason why problem has been resistant to antibiotics.  Plan: Clotrimazole topical application 4 weeks X-ray left foot Continue antibiotic regiment Referral to podiatry for evaluation    Patient discussed with Dr. Alver Sorrow MD [facility]  PGY-1 Pager: [phone]  Phone: [phone] Date 08/22/2023  Time 8:42 AM

## 2023-08-21 NOTE — Patient Instructions (Signed)
 Thank you, [name] for allowing [place] to provide your care today.  Referrals ordered today:    Referral Orders         Ambulatory referral to Podiatry      I have ordered the following medication/changed the following medications:    Start the following medications: Meds ordered this encounter  Medications   clotrimazole (LOTRIMIN) 1 % cream    Sig: Apply 1 Application topically 2 (two) times daily.    Dispense:  113 g    Refill:  1   Use for four weeks   Follow up:  1 month  for follow up on foot  Please remember: Please let us know if you do not hear back from the foot doctor. You will use the topical ointment on your foot for four weeks.    We look forward to seeing you next time. Please call our clinic at [phone] if you have any questions or concerns. The best time to call is Monday-Friday from 9am-4pm, but there is someone available 24/7. If after hours or the weekend, call the main hospital number and ask for the Internal Medicine Resident On-Call. If you need medication refills, please notify your pharmacy one week in advance and they will send [place] a request.   Thank you for trusting me with your care. Wishing you the best!  Lovie Macadamia MD [facility]

## 2023-08-25 ENCOUNTER — Encounter: Payer: Self-pay | Admitting: Podiatry

## 2023-08-25 ENCOUNTER — Ambulatory Visit (INDEPENDENT_AMBULATORY_CARE_PROVIDER_SITE_OTHER): Admitting: Podiatry

## 2023-08-25 DIAGNOSIS — B353 Tinea pedis: Secondary | ICD-10-CM

## 2023-08-25 NOTE — Progress Notes (Signed)
  Subjective:  Patient ID: Theresa Kirk, female    DOB: [birth year],  MRN: [id number]  Chief Complaint  Patient presents with   Tinea Pedis    Rm 6:*urgent referral* cellulitis of 4th & 5th L toes; tinea pedis of L foot also-non req-Dr. Reymundo Poll refer. Pt states that the area between 4th and 5th toe has been itching and irritated for about a month. She as been on ABT therapy and is now on clotrimazole cream. She said that seems to be helping.    Discussed the use of AI scribe software for clinical note transcription with the patient, who gave verbal consent to proceed.  History of Present Illness Theresa Kirk is a 63 year old female who presents with a left foot skin infection.  She initially experienced a stinging sensation in her left foot, which progressed to small bumps that developed pus by the following morning. She attempted to treat the condition with peroxide, but it was ineffective.  Two days after the onset of symptoms, she visited the emergency room and was prescribed antibiotics. The initial antibiotic provided some relief, but symptoms recurred after completing the course. A second antibiotic led to an allergic reaction, necessitating discontinuation after two days. A third antibiotic was prescribed, but symptoms persisted.  She suspects the last antibiotic may have caused a urinary tract infection. She started using a topical cream on Monday, which has improved the condition, though itching persists. She is making efforts to keep the area dry.  She believes the infection may have been contracted from her stepson after using the same shower, despite cleaning it. She notes her feet were fine before this incident.      Objective:    Physical Exam VASCULAR: DP and PT pulse palpable. Foot is warm and well-perfused. Capillary fill time is brisk. DERMATOLOGIC: Tinea pedis with interdigital maceration on left foot, fourth inner space, dry scaling, peeling, itchy skin.   NEUROLOGIC: Normal sensation to light touch and pressure. No paresthesias on examination. ORTHOPEDIC: Smooth pain-free range of motion of all examined joints. No ecchymosis or bruising. No gross deformity. No pain to palpation.   No images are attached to the encounter.    Results     Assessment:   1. Tinea pedis, left      Plan:  Patient was evaluated and treated and all questions answered.  Assessment and Plan Assessment & Plan Tinea pedis Tinea pedis with interdigital maceration on the left foot, specifically in the fourth inner space, characterized by dry scaling, peeling, and pruritus. Recurrent symptoms have persisted despite multiple antibiotic courses due to initial misdiagnosis. Current treatment with a topical antifungal cream has shown improvement. The condition is likely exacerbated by a moist environment, potentially contracted from a shared shower surface. Maintaining dryness is crucial as the fungal infection thrives in dark, damp environments. Oral antifungal medication will be considered if the condition does not improve with current treatment. - Continue topical antifungal clotrimazole for one month. - Maintain dryness of the affected area, especially post-bathing. - Apply cream after thorough drying of the area. - Use antifungal spray on all shoes to prevent reinfection. - Clean shower floor with bleach weekly to eliminate fungal spores. - Contact provider for potential oral antifungal prescription if symptoms persist by late April (lamisil 30 day)      No follow-ups on file.

## 2023-09-04 NOTE — Progress Notes (Signed)
 Internal Medicine Clinic Attending  Case discussed with the resident at the time of the visit.  We reviewed the resident's history and exam and pertinent patient test results.  I agree with the assessment, diagnosis, and plan of care documented in the resident's note.

## 2023-09-23 ENCOUNTER — Ambulatory Visit (HOSPITAL_COMMUNITY)
Admission: RE | Admit: 2023-09-23 | Discharge: 2023-09-23 | Disposition: A | Discharge status: Home / Self Care | Source: Ambulatory Visit | Attending: Internal Medicine | Admitting: Internal Medicine

## 2023-09-23 ENCOUNTER — Encounter: Payer: Self-pay | Admitting: Internal Medicine

## 2023-09-23 ENCOUNTER — Ambulatory Visit: Admitting: Internal Medicine

## 2023-09-23 VITALS — BP 115/68 | HR 72 | Temp 98.1°F | Ht 65.0 in | Wt 206.4 lb

## 2023-09-23 DIAGNOSIS — M1711 Unilateral primary osteoarthritis, right knee: Secondary | ICD-10-CM | POA: Insufficient documentation

## 2023-09-23 DIAGNOSIS — M25561 Pain in right knee: Secondary | ICD-10-CM | POA: Diagnosis not present

## 2023-09-23 DIAGNOSIS — E669 Obesity, unspecified: Secondary | ICD-10-CM

## 2023-09-23 DIAGNOSIS — F331 Major depressive disorder, recurrent, moderate: Secondary | ICD-10-CM

## 2023-09-23 DIAGNOSIS — B353 Tinea pedis: Secondary | ICD-10-CM | POA: Diagnosis not present

## 2023-09-23 DIAGNOSIS — E785 Hyperlipidemia, unspecified: Secondary | ICD-10-CM

## 2023-09-23 DIAGNOSIS — R7303 Prediabetes: Secondary | ICD-10-CM | POA: Diagnosis not present

## 2023-09-23 DIAGNOSIS — E66811 Obesity, class 1: Secondary | ICD-10-CM | POA: Insufficient documentation

## 2023-09-23 DIAGNOSIS — R053 Chronic cough: Secondary | ICD-10-CM | POA: Diagnosis not present

## 2023-09-23 DIAGNOSIS — Z6834 Body mass index (BMI) 34.0-34.9, adult: Secondary | ICD-10-CM

## 2023-09-23 DIAGNOSIS — M25461 Effusion, right knee: Secondary | ICD-10-CM | POA: Diagnosis not present

## 2023-09-23 MED ORDER — DICLOFENAC SODIUM 1 % EX GEL: 4.0000 g | Freq: Four times a day (QID) | CUTANEOUS | 1 refills | Status: DC

## 2023-09-23 MED ORDER — ALBUTEROL SULFATE HFA 108 (90 BASE) MCG/ACT IN AERS: 1.0000 | INHALATION_SPRAY | Freq: Four times a day (QID) | RESPIRATORY_TRACT | 0 refills | Status: DC | PRN

## 2023-09-23 MED ORDER — CLOTRIMAZOLE 1 % EX CREA: 1.0000 | TOPICAL_CREAM | Freq: Two times a day (BID) | CUTANEOUS | 1 refills | Status: DC

## 2023-09-23 NOTE — Assessment & Plan Note (Signed)
 History of major depressive disorder. PHQ9 14 today consistent with moderate depression. Denies SI. Started on sertraline  at prior visit but has not taken regularly. Patient states she does not want to take daily medication at this time. Interested in counseling.  Plan -Referral to [facility] for mental health counseling

## 2023-09-23 NOTE — Progress Notes (Signed)
 Established Patient Office Visit  Subjective   Patient ID: Theresa Kirk, female    DOB: 27-May-1961  Age: 63 y.o. MRN: [id number]  Chief Complaint  Patient presents with   Follow-up    Left foot - blister/itching.   Handicap sticker    Theresa Kirk returns to clinic today for f/u of left foot infection and to discuss right knee pain. Please see assessment/plan in problem-based charting for further details of today's visit.   Patient Active Problem List   Diagnosis Date Noted   Obesity (BMI 30.0-34.9) 09/23/2023   Moderate recurrent major depression (HCC) 07/14/2023   Tinea pedis of left foot 07/14/2023   Cataract, nuclear sclerotic, both eyes 03/04/2023   Restless leg syndrome 08/12/2022   Peripheral polyneuropathy 08/12/2022   Leg cramping 09/18/2021   Sensorineural hearing loss (SNHL) of both ears 01/25/2020   Tinnitus 09/21/2019   Hot flashes 04/20/2019   Numerous moles 04/20/2019   Back pain 07/29/2017   Feeling of incomplete bladder emptying 02/27/2017   Prediabetes 09/29/2014   Bilateral ocular hypertension 06/27/2014   Cervical pain (neck) 05/05/2014   Chronic cough 12/09/2012   Seasonal allergies 09/23/2012   Arthritis of right knee 03/26/2012   Carpal tunnel syndrome 06/13/2010   Hyperlipidemia 11/06/2006      Objective:     BP 115/68 (BP Location: Right Arm, Patient Position: Sitting, Cuff Size: Normal)   Pulse 72   Temp 98.1 F (36.7 C) (Oral)   Ht 5\' 5"  (1.651 m)   Wt 206 lb 6.4 oz (93.6 kg)   LMP 09/14/2006   SpO2 97% Comment: RA  BMI 34.35 kg/m  BP Readings from Last 3 Encounters:  09/23/23 115/68  08/21/23 130/68  08/06/23 123/73   Wt Readings from Last 3 Encounters:  09/23/23 206 lb 6.4 oz (93.6 kg)  08/21/23 203 lb 12.8 oz (92.4 kg)  08/06/23 205 lb 11.2 oz (93.3 kg)      Physical Exam Vitals reviewed.  Constitutional:      General: She is not in acute distress.    Appearance: Normal appearance. She is not ill-appearing.   Musculoskeletal:        General: Normal range of motion.     Comments: Right knee without deformity, swelling, or TTP. Negative [place].   Skin:    General: Skin is warm and dry.     Comments: Healing of skin between fourth and fifth left toe. Continues to be peeling over the plantar foot near toes 4/5. No warmth, erythema or swelling.  Neurological:     General: No focal deficit present.     Mental Status: She is alert.     Gait: Gait abnormal (antalgic; cane assist).  Psychiatric:        Mood and Affect: Mood normal.        Behavior: Behavior normal.       Assessment & Plan:   Problem List Items Addressed This Visit       Musculoskeletal and Integument   Tinea pedis of left foot   Follow-up for tinea pedis of the left foot. She has been using clotrimazole  cream with some improvement in symptoms. Recently she noted worsening itching and a blister over the plantar surface of the foot. She popped this with a needle and it continues to itch.  On exam, maceration and peeling between the fourth and fifth toes has resolved. She does have peeling of the plantar foot. No findings consistent with bacterial infection. Refilled clotrimazole  and recommended podiatry f/u.  Plan -Topical clotrimazole  -F/u with podiatry       Relevant Medications   clotrimazole  (LOTRIMIN ) 1 % cream   Arthritis of right knee - Primary   Chronic right knee pain. Previous XR in 2023 consistent with OA, mostly medial. Describes pain with walking and occasionally gives out. Using a cane for assistance and stability. Not currently using anything for pain. Exam without evidence of instability or concern for meniscal injury. Suspect progressive OA.  Plan -Referral to PT -Acetaminophen  and Voltaren  gel for pain -Update R knee XR      Relevant Medications   diclofenac  Sodium (VOLTAREN ) 1 % GEL   Other Relevant Orders   Ambulatory referral to Physical Therapy   DG Knee Complete 4 Views Right (Completed)      Other   Hyperlipidemia   Moderate intensity statin started after last visit given LDL persistently >160. Theresa Kirk reports intermittent use since last visit, has not taken in the last week. She is willing to resume and we will plan to repeat labs at future visit with consistent use.  Plan -Rosuvastatin  10 mg daily -Lipid panel at next visit      Chronic cough   Relevant Medications   albuterol  (VENTOLIN  HFA) 108 (90 Base) MCG/ACT inhaler   Prediabetes   A1c remains in prediabetes range on last check, 6.3% 07/2023. Discussed referral for Medicare DPP today, not interested at this time. Recommend healthy nutrition and activity for diabetes prevention.  Plan -Repeat A1c 07/2024 -Lifestyle modification      Moderate recurrent major depression (HCC)   History of major depressive disorder. PHQ9 14 today consistent with moderate depression. Denies SI. Started on sertraline  at prior visit but has not taken regularly. Patient states she does not want to take daily medication at this time. Interested in counseling.  Plan -Referral to [facility] for mental health counseling      Relevant Orders   AMB Referral [facility] Care Management   Obesity (BMI 30.0-34.9)   BMI 34.35. Discussed referral to [facility] today but patient declined. Continue to encourage lifestyle modification.       Return in about 3 months (around 12/23/2023).    Bevelyn Bryant, MD

## 2023-09-23 NOTE — Assessment & Plan Note (Signed)
 Moderate intensity statin started after last visit given LDL persistently >160. Theresa Kirk reports intermittent use since last visit, has not taken in the last week. She is willing to resume and we will plan to repeat labs at future visit with consistent use.  Plan -Rosuvastatin  10 mg daily -Lipid panel at next visit

## 2023-09-23 NOTE — Assessment & Plan Note (Signed)
 Chronic right knee pain. Previous XR in 2023 consistent with OA, mostly medial. Describes pain with walking and occasionally gives out. Using a cane for assistance and stability. Not currently using anything for pain. Exam without evidence of instability or concern for meniscal injury. Suspect progressive OA.  Plan -Referral to PT -Acetaminophen  and Voltaren  gel for pain -Update R knee XR

## 2023-09-23 NOTE — Assessment & Plan Note (Signed)
 Follow-up for tinea pedis of the left foot. She has been using clotrimazole  cream with some improvement in symptoms. Recently she noted worsening itching and a blister over the plantar surface of the foot. She popped this with a needle and it continues to itch.  On exam, maceration and peeling between the fourth and fifth toes has resolved. She does have peeling of the plantar foot. No findings consistent with bacterial infection. Refilled clotrimazole  and recommended podiatry f/u.  Plan -Topical clotrimazole  -F/u with podiatry

## 2023-09-23 NOTE — Assessment & Plan Note (Signed)
 BMI 34.35. Discussed referral to [facility] today but patient declined. Continue to encourage lifestyle modification.

## 2023-09-23 NOTE — Patient Instructions (Addendum)
 It was wonderful to see you today!  *For exercise classes (in-person and online), group activities, and more* (50+)            [email or web address]  Please contact your pharmacy about scheduling the shingles (Shingrix) vaccine.   For knee pain: -Voltaren  gel four times daily -Acetaminophen  (Tylenol ) 650 mg four times daily as needed  I have sent a referral to physical therapy to help strengthen your knee.  Please contact podiatry for an office visit to f/u on your foot. Continue to use the clotrimazole  ointment. Do NOT stick needles or anything else in that area, we don't want it to get infected!

## 2023-09-23 NOTE — Assessment & Plan Note (Signed)
 A1c remains in prediabetes range on last check, 6.3% 07/2023. Discussed referral for Medicare DPP today, not interested at this time. Recommend healthy nutrition and activity for diabetes prevention.  Plan -Repeat A1c 07/2024 -Lifestyle modification

## 2023-09-24 ENCOUNTER — Other Ambulatory Visit: Payer: Self-pay | Admitting: Internal Medicine

## 2023-09-24 DIAGNOSIS — B353 Tinea pedis: Secondary | ICD-10-CM

## 2023-09-24 DIAGNOSIS — R053 Chronic cough: Secondary | ICD-10-CM

## 2023-09-24 DIAGNOSIS — M1711 Unilateral primary osteoarthritis, right knee: Secondary | ICD-10-CM

## 2023-09-24 MED ORDER — CLOTRIMAZOLE 1 % EX CREA: 1.0000 | TOPICAL_CREAM | Freq: Two times a day (BID) | CUTANEOUS | 1 refills | Status: DC

## 2023-09-24 MED ORDER — DICLOFENAC SODIUM 1 % EX GEL
4.0000 g | Freq: Four times a day (QID) | CUTANEOUS | 1 refills | Status: AC
Start: 2023-09-24 — End: ?

## 2023-09-24 MED ORDER — ALBUTEROL SULFATE HFA 108 (90 BASE) MCG/ACT IN AERS: 1.0000 | INHALATION_SPRAY | Freq: Four times a day (QID) | RESPIRATORY_TRACT | 0 refills | Status: DC | PRN

## 2023-09-24 NOTE — Telephone Encounter (Signed)
 Patient requesting prescriptions sent to another pharmacy due to closure. Pharmacy updated in med orders   Copied from CRM [id number]. Topic: Clinical - Prescription Issue >> Sep 24, 2023  1:22 PM Corin V wrote: Reason for CRM: Patient stated that the [facility] meds were [facility] to yesterday is closed and she can't get them filled. She asked that they be sent to the [facility] at [address] [place], [place], [place] [address]. The prescriptions are for albuterol , clotrimazole , and diclofenac  gel.

## 2023-09-24 NOTE — Telephone Encounter (Signed)
 [facility] on [address] is currently closed b/c they do not have a Teacher, early years/pre (per [employer] on [address]).

## 2023-09-25 ENCOUNTER — Telehealth: Payer: Self-pay | Admitting: Internal Medicine

## 2023-09-25 NOTE — Telephone Encounter (Signed)
 Copied from CRM [id number]. Topic: Clinical - Prescription Issue >> Sep 25, 2023  9:42 AM Wynona Hedger wrote: Reason for CRM: Patient received 2 of clotrimazole  (LOTRIMIN ) 1 % cream she did not receive the diclofenac  Sodium (VOLTAREN ) 1 % GEL

## 2023-09-25 NOTE — Progress Notes (Signed)
 Reviewed with Ms. Revere 4/24.

## 2023-09-25 NOTE — Telephone Encounter (Signed)
 Call to pharmacy.  Voltaren  gel is OTC.  Patient's insurance will not pay for the Diclofenac  and is not being ordered by her Pharmacy.  Patient to call pharmacy near her home to see if they carry the Diclofenac  Gel and request a transfer of the prescription.

## 2023-09-26 ENCOUNTER — Encounter: Payer: Self-pay | Admitting: Internal Medicine

## 2023-09-26 DIAGNOSIS — D126 Benign neoplasm of colon, unspecified: Secondary | ICD-10-CM | POA: Insufficient documentation

## 2023-09-26 DIAGNOSIS — K222 Esophageal obstruction: Secondary | ICD-10-CM | POA: Insufficient documentation

## 2023-09-26 DIAGNOSIS — K449 Diaphragmatic hernia without obstruction or gangrene: Secondary | ICD-10-CM | POA: Insufficient documentation

## 2023-09-29 ENCOUNTER — Ambulatory Visit
Admission: RE | Admit: 2023-09-29 | Discharge: 2023-09-29 | Disposition: A | Discharge status: Home / Self Care | Payer: 59 | Source: Ambulatory Visit | Attending: Internal Medicine | Admitting: Internal Medicine

## 2023-09-29 DIAGNOSIS — Z1231 Encounter for screening mammogram for malignant neoplasm of breast: Secondary | ICD-10-CM

## 2023-10-02 ENCOUNTER — Telehealth: Payer: Self-pay | Admitting: *Deleted

## 2023-10-02 NOTE — Therapy (Signed)
 OUTPATIENT PHYSICAL THERAPY LOWER EXTREMITY EVALUATION   Patient Name: Theresa Kirk MRN: [id number] DOB:1960-10-20, 63 y.o., female Today's Date: 10/07/2023  END OF SESSION:  PT End of Session - 10/07/23 0751     Visit Number 1    Number of Visits 12    Date for PT Re-Evaluation 11/20/23    Authorization Type U[employer] MCR/MCD Dual Complete (LEFS)    Progress Note Due on Visit 10    PT Start Time 0755    PT Stop Time 0840    PT Time Calculation (min) 45 min    Activity Tolerance Patient limited by pain;Patient tolerated treatment well    Behavior During Therapy [facility] for tasks assessed/performed             Past Medical History:  Diagnosis Date   Abnormal mammogram with microcalcification 10/15/2013   Acquired hallux limitus of both feet 10/15/2019   Acute pain of right wrist 02/03/2018   Adverse effect of drug 08/03/2023   Alcohol abuse    Allergic reaction 08/03/2023   Arthritis    Asthma    Callus of foot 06/07/2008   Qualifier: Diagnosis of  By: Derald Flattery MD, Vijay     Callus of foot 06/07/2008   Carpal tunnel syndrome    Cervical lymphadenopathy 09/21/2019   Cervical muscle pain 05/05/2014   - Pt with cervical pain radiating down her neck with no signs of radiculopathy or weakness in upper extremities   Depression    Fibromyalgia    Foot pain, left 08/12/2022   Healthcare maintenance 04/13/2012   Healthcare maintenance 04/13/2012   - Last Pap/ HPV testing in 2023      High cholesterol    Irritation symptom of skin 12/30/2017   Screening mammogram for breast cancer 08/12/2022   Past Surgical History:  Procedure Laterality Date   ABDOMINAL HYSTERECTOMY  2008   h/o uterine fibroids, ?partial hysterectomy per pt 04/13/12   CARPAL TUNNEL RELEASE Right 01/05/2021   Procedure: RIGHT CARPAL TUNNEL RELEASE;  Surgeon: Brunilda Capra, MD;  Location: [facility];  Service: Orthopedics;  Laterality: Right;  30 MIN   COLONOSCOPY     FOOT SURGERY Right    KNEE  SURGERY     Patient Active Problem List   Diagnosis Date Noted   Schatzki's ring 09/26/2023   Hiatal hernia 09/26/2023   Tubular adenoma of colon 09/26/2023   Obesity (BMI 30.0-34.9) 09/23/2023   Moderate recurrent major depression (HCC) 07/14/2023   Tinea pedis of left foot 07/14/2023   Cataract, nuclear sclerotic, both eyes 03/04/2023   Restless leg syndrome 08/12/2022   Peripheral polyneuropathy 08/12/2022   Leg cramping 09/18/2021   Sensorineural hearing loss (SNHL) of both ears 01/25/2020   Tinnitus 09/21/2019   Hot flashes 04/20/2019   Numerous moles 04/20/2019   Back pain 07/29/2017   Feeling of incomplete bladder emptying 02/27/2017   Prediabetes 09/29/2014   Bilateral ocular hypertension 06/27/2014   Cervical pain (neck) 05/05/2014   Chronic cough 12/09/2012   Seasonal allergies 09/23/2012   Arthritis of right knee 03/26/2012   Carpal tunnel syndrome 06/13/2010   Hyperlipidemia 11/06/2006    PCP: Bevelyn Bryant, MD   REFERRING PROVIDER: Bevelyn Bryant, MD   REFERRING DIAG: M17.11 (ICD-10-CM) - Arthritis of right knee   THERAPY DIAG:  Chronic pain of right knee  Difficulty in walking, not elsewhere classified  Muscle weakness (generalized)  Rationale for Evaluation and Treatment: Rehabilitation  ONSET DATE: Intermittent pain over past 5 years  SUBJECTIVE:  SUBJECTIVE STATEMENT: I have off and on pain for my right knee for the past 5 years. I have been taking medicine but it has been so much worse in the last week. I have been here before and I got better. Pt report takes care of 5 grandchildren with my husband and it has been more difficult to do it because of my knee pain.  I left my cane at home. I just started using it. Uses shower chair to keep from slipping.I help my cousin sometimes clean my cousin clean the bathrooms up high. I avoid bending down. I dont like to stand or walk for more than 5 minutes35/80 43.8%  PERTINENT HISTORY: Fibromyalgia, OA,  depression PAIN:  Are you having pain? Yes: NPRS scale: 8/10 now and10/10 Pain location: Right knee Pain description: nagging and when standing sharp and walking pain   Aggravating factors: Standing longer than , walking, driving sometimes, mopping sweeping Relieving factors: rest, medicine Tylenol  eases it  PRECAUTIONS: None  RED FLAGS: None   WEIGHT BEARING RESTRICTIONS: No  FALLS:  Has patient fallen in last 6 months? No  LIVING ENVIRONMENT: Lives with: lives with their family Lives in: House/apartment Stairs:  3 steps to enter and avoid steps and can't visit mom due to 3 flights of stairs Has following equipment at home: Single point cane and shower chair  OCCUPATION: On disability   PLOF: Independent  PATIENT GOALS: Decrease knee pain and improve knee leg strength  NEXT MD VISIT: TBD  OBJECTIVE:  Note: Objective measures were completed at Evaluation unless otherwise noted.  DIAGNOSTIC FINDINGS: 09/23/2023 09:29:00 AM   COMPARISON: None available.   CLINICAL HISTORY: Right knee pain, ho arthritis. Right anterior knee pain x 2 months, stated when she walks it gives way, swelling, no known injury.   FINDINGS:   BONES AND JOINTS: Mild tricompartmental degenerative changes, most prominent in the patellofemoral compartment. Moderate suprapatellar knee joint effusion. No fracture is seen.   SOFT TISSUES: The soft tissues are unremarkable.   IMPRESSION: 1. Mild degenerative changes. 2. Moderate suprapatellar knee joint effusion.   Electronically signed by: Zadie Herter MD 09/23/2023 10:16 AM EDT RP Workstation: [id number]  PATIENT SURVEYS:  LEFS 35/80 43.8%  COGNITION: Overall cognitive status: Within functional limits for tasks assessed     SENSATION: WFL  EDEMA:  No swelling of knee  MUSCLE LENGTH: Hamstrings: Bil tightness    POSTURE: rounded shoulders, forward head, and obesity  PALPATION: Global tendernss over Right  knee  LOWER EXTREMITY ROM:  Active ROM Right eval Left eval  Hip flexion    Hip extension    Hip abduction    Hip adduction    Hip internal rotation    Hip external rotation    Knee flexion 117/P125 126/P 133  Knee extension 0 0  Ankle dorsiflexion    Ankle plantarflexion    Ankle inversion    Ankle eversion     (Blank rows = not tested)  LOWER EXTREMITY MMT:  MMT Right eval Left eval  Hip flexion 4+ 4+  Hip extension 4 4+  Hip abduction 4 4  Hip adduction    Hip internal rotation    Hip external rotation    Knee flexion 4 5  Knee extension 4 5  Ankle dorsiflexion    Ankle plantarflexion 5/25 12/25  Ankle inversion    Ankle eversion     (Blank rows = not tested)  LOWER EXTREMITY SPECIAL TESTS:  NT FUNCTIONAL TESTS:  5 times sit  to stand: 23.16 seconds 2 minute walk test: 349.7 (546 ftnorm)  Squat  flex/knee/hip about 60 degrees and then too painful GAIT: Distance walked: 349.7 ft  Assistive device utilized: None Level of assistance: Complete Independence Comments: Pt with slow cadence and slight antalgic gait                                                                                                                                TREATMENT DATE: EVAL and Issue HEP and demo    PATIENT EDUCATION:  Education details: POC Explanation of findings issue HEP  Person educated: Patient Education method: Explanation, Demonstration, Tactile cues, Verbal cues, and Handouts Education comprehension: verbalized understanding, returned demonstration, verbal cues required, tactile cues required, and needs further education  HOME EXERCISE PROGRAM: Access Code: [email or web address] URL: [email or web address] Date: 10/07/2023 Prepared by: Sharlet Dawson  Exercises - Supine Active Straight Leg Raise  - 1 x daily - 7 x weekly - 3 sets - 10 reps - Sidelying Hip Adduction  - 1 x daily - 7 x weekly - 3 sets - 10 reps - Seated Long Arc Quad with Ankle Weight  - 1  x daily - 7 x weekly - 3 sets - 10 reps - Sit to stand with sink support Movement snack  - 1 x daily - 7 x weekly - 3 sets - 10 reps - Heel raise with counter support and towel under toes  - 1 x daily - 7 x weekly - 3 sets - 15 reps  ASSESSMENT:  CLINICAL IMPRESSION: Patient is a 63 y.o. female who was seen today for physical therapy evaluation and treatment for right knee pain. Pt demonstrates slight antalgic gait and limitations in standing and walking due to pain.  Pt cares for grandchildren and needs to be more mobile. Pt will benefit from skilled PT to address impairments and return to a more active and healthier lifestyle  OBJECTIVE IMPAIRMENTS: decreased activity tolerance, decreased balance, decreased mobility, difficulty walking, decreased ROM, decreased strength, hypomobility, postural dysfunction, obesity, and pain.   ACTIVITY LIMITATIONS: carrying, standing, squatting, stairs, locomotion level, and caring for others  PARTICIPATION LIMITATIONS: meal prep, cleaning, laundry, driving, community activity, and caring for grandchildren  PERSONAL FACTORS: Fibromyalgia, OA, depression are also affecting patient's functional outcome.   REHAB POTENTIAL: Good  CLINICAL DECISION MAKING: Evolving/moderate complexity  EVALUATION COMPLEXITY: Moderate   GOALS: Goals reviewed with patient? Yes  SHORT TERM GOALS: Target date: 10-30-23 Pt will be able to  perform 5 x STS in 15 sec or less Baseline:23.16 sec Goal status: INITIAL  2.  Pt will independent with initial HEP Baseline: limited knowledge Goal status: INITIAL  3.  PT will be educated on walking program for routine exercise and movement Baseline: Pt with no routine exercise Goal status: INITIAL    LONG TERM GOALS: Target date: 11-20-23  Pt will be able to perform advanced HEP I Baseline: limited knowledge Goal status: INITIAL  2.  Pt will be able to stand for 30 minutes to be able to complete household chores with 3/10  pain or less Baseline: Pt reports 8/10 to 10/10 pain Goal status: INITIAL  3.  Pt will be able to walk at least 3 x week for at least 25 minutes without stopping Baseline: no current exercise program limiting walking Goal status: INITIAL  4.  Pt will be able to perform single leg heel raise on Right knee 25 x Baseline:  5 x at eval with pain on R Goal status: INITIAL  5.  Pt will be able to perform 6 MWT within nom Baseline: 349;7 ft Goal status: INITIAL  6.  LEFS will improve to at least 57% Baseline: eval 35/80  43.8% Goal status: INITIAL   PLAN: Progress HEP and strength  PT FREQUENCY: 1-2x/week  PT DURATION: 6 weeks  PLANNED INTERVENTIONS: 97164- PT Re-evaluation, 97750- Physical Performance Testing, 97110-Therapeutic exercises, 97530- Therapeutic activity, [id number]- Neuromuscular re-education, 97535- Self Care, 16109- Manual therapy, [id number]- Gait training, [phone]- Aquatic Therapy, [phone]- Electrical stimulation (manual), Patient/Family education, Balance training, Stair training, Taping, Dry Needling, Joint mobilization, Cryotherapy, and Moist heat  PLAN FOR NEXT SESSION: Progress HEP and taping as needed.  Education on OA  Sharlet Dawson, PT, [facility] Certified Exercise Expert for the Aging Adult  10/07/23 2:40 PM Phone: [phone] Fax: [phone]

## 2023-10-02 NOTE — Progress Notes (Signed)
 Complex Care Management Note  Care Guide Note 10/02/2023 Name: Theresa Kirk MRN: [id number] DOB: [birth year]  Theresa Kirk is a 63 y.o. year old female who sees Bevelyn Bryant, MD for primary care. I reached out to [employer] by phone today to offer complex care management services.  [name] was given information about Complex Care Management services today including:   The Complex Care Management services include support from the care team which includes your Nurse Care Manager, Clinical Social Worker, or Pharmacist.  The Complex Care Management team is here to help remove barriers to the health concerns and goals most important to you. Complex Care Management services are voluntary, and the patient may decline or stop services at any time by request to their care team member.   Complex Care Management Consent Status: Patient agreed to services and verbal consent obtained.   Follow up plan:  Telephone appointment with complex care management team member scheduled for:  5/20  Encounter Outcome:  Patient Scheduled  Barnie Bora  [facility]  [facility], [facility] Guide  Direct Dial: [phone]  Fax [phone]

## 2023-10-07 ENCOUNTER — Ambulatory Visit: Attending: Internal Medicine | Admitting: Physical Therapy

## 2023-10-07 DIAGNOSIS — M1711 Unilateral primary osteoarthritis, right knee: Secondary | ICD-10-CM | POA: Insufficient documentation

## 2023-10-07 DIAGNOSIS — G8929 Other chronic pain: Secondary | ICD-10-CM | POA: Insufficient documentation

## 2023-10-07 DIAGNOSIS — R262 Difficulty in walking, not elsewhere classified: Secondary | ICD-10-CM | POA: Diagnosis not present

## 2023-10-07 DIAGNOSIS — M25561 Pain in right knee: Secondary | ICD-10-CM | POA: Insufficient documentation

## 2023-10-07 DIAGNOSIS — M6281 Muscle weakness (generalized): Secondary | ICD-10-CM | POA: Diagnosis not present

## 2023-10-15 ENCOUNTER — Ambulatory Visit: Admitting: Physical Therapy

## 2023-10-15 ENCOUNTER — Encounter: Payer: Self-pay | Admitting: Physical Therapy

## 2023-10-15 DIAGNOSIS — R262 Difficulty in walking, not elsewhere classified: Secondary | ICD-10-CM | POA: Diagnosis not present

## 2023-10-15 DIAGNOSIS — M6281 Muscle weakness (generalized): Secondary | ICD-10-CM

## 2023-10-15 DIAGNOSIS — G8929 Other chronic pain: Secondary | ICD-10-CM

## 2023-10-15 DIAGNOSIS — M25561 Pain in right knee: Secondary | ICD-10-CM | POA: Diagnosis not present

## 2023-10-15 DIAGNOSIS — M1711 Unilateral primary osteoarthritis, right knee: Secondary | ICD-10-CM | POA: Diagnosis not present

## 2023-10-15 NOTE — Therapy (Signed)
 OUTPATIENT PHYSICAL THERAPY DAILY VISIT   Patient Name: Theresa Kirk MRN: [id number] DOB:[birth year], 63 y.o., female Today's Date: 10/15/2023  END OF SESSION:  PT End of Session - 10/15/23 1537     Visit Number 2    Number of Visits 12    Date for PT Re-Evaluation 11/20/23    Authorization Type UHC MCR/MCD Dual Complete (LEFS)    Progress Note Due on Visit 10    PT Start Time 1537    PT Stop Time 1614    PT Time Calculation (min) 37 min    Activity Tolerance Patient limited by pain;Patient tolerated treatment well    Behavior During Therapy [facility] for tasks assessed/performed             Past Medical History:  Diagnosis Date   Abnormal mammogram with microcalcification 10/15/2013   Acquired hallux limitus of both feet 10/15/2019   Acute pain of right wrist 02/03/2018   Adverse effect of drug 08/03/2023   Alcohol abuse    Allergic reaction 08/03/2023   Arthritis    Asthma    Callus of foot 06/07/2008   Qualifier: Diagnosis of  By: Derald Flattery MD, Vijay     Callus of foot 06/07/2008   Carpal tunnel syndrome    Cervical lymphadenopathy 09/21/2019   Cervical muscle pain 05/05/2014   - Pt with cervical pain radiating down her neck with no signs of radiculopathy or weakness in upper extremities   Depression    Fibromyalgia    Foot pain, left 08/12/2022   Healthcare maintenance 04/13/2012   Healthcare maintenance 04/13/2012   - Last Pap/ HPV testing in 2023      High cholesterol    Irritation symptom of skin 12/30/2017   Screening mammogram for breast cancer 08/12/2022   Past Surgical History:  Procedure Laterality Date   ABDOMINAL HYSTERECTOMY  2008   h/o uterine fibroids, ?partial hysterectomy per pt 04/13/12   CARPAL TUNNEL RELEASE Right 01/05/2021   Procedure: RIGHT CARPAL TUNNEL RELEASE;  Surgeon: Brunilda Capra, MD;  Location: [facility];  Service: Orthopedics;  Laterality: Right;  30 MIN   COLONOSCOPY     FOOT SURGERY Right    KNEE SURGERY      Patient Active Problem List   Diagnosis Date Noted   Schatzki's ring 09/26/2023   Hiatal hernia 09/26/2023   Tubular adenoma of colon 09/26/2023   Obesity (BMI 30.0-34.9) 09/23/2023   Moderate recurrent major depression (HCC) 07/14/2023   Tinea pedis of left foot 07/14/2023   Cataract, nuclear sclerotic, both eyes 03/04/2023   Restless leg syndrome 08/12/2022   Peripheral polyneuropathy 08/12/2022   Leg cramping 09/18/2021   Sensorineural hearing loss (SNHL) of both ears 01/25/2020   Tinnitus 09/21/2019   Hot flashes 04/20/2019   Numerous moles 04/20/2019   Back pain 07/29/2017   Feeling of incomplete bladder emptying 02/27/2017   Prediabetes 09/29/2014   Bilateral ocular hypertension 06/27/2014   Cervical pain (neck) 05/05/2014   Chronic cough 12/09/2012   Seasonal allergies 09/23/2012   Arthritis of right knee 03/26/2012   Carpal tunnel syndrome 06/13/2010   Hyperlipidemia 11/06/2006    PCP: Bevelyn Bryant, MD   REFERRING PROVIDER: Bevelyn Bryant, MD   REFERRING DIAG: M17.11 (ICD-10-CM) - Arthritis of right knee   THERAPY DIAG:  Chronic pain of right knee  Difficulty in walking, not elsewhere classified  Muscle weakness (generalized)  Rationale for Evaluation and Treatment: Rehabilitation  ONSET DATE: Intermittent pain over past 5 years  SUBJECTIVE:  SUBJECTIVE STATEMENT:  10/15/2023: Pt reports no change in her pain since evaluation.  She has done her exercises "a little"  EVAL:  I have off and on pain for my right knee for the past 5 years. I have been taking medicine but it has been so much worse in the last week. I have been here before and I got better. Pt report takes care of 5 grandchildren with my husband and it has been more difficult to do it because of my knee pain.  I left my cane at home. I just started using it. Uses shower chair to keep from slipping.I help my cousin sometimes clean my cousin clean the bathrooms up high. I avoid bending down. I dont  like to stand or walk for more than 5 minutes.  PERTINENT HISTORY: Fibromyalgia, OA, depression PAIN:  Are you having pain? Yes: NPRS scale: 8/10 now and10/10 Pain location: Right knee Pain description: nagging and when standing sharp and walking pain   Aggravating factors: Standing longer than , walking, driving sometimes, mopping sweeping Relieving factors: rest, medicine Tylenol  eases it  PRECAUTIONS: None  RED FLAGS: None   WEIGHT BEARING RESTRICTIONS: No  FALLS:  Has patient fallen in last 6 months? No  LIVING ENVIRONMENT: Lives with: lives with their family Lives in: House/apartment Stairs: 3 steps to enter and avoid steps and can't visit mom due to 3 flights of stairs Has following equipment at home: Single point cane and shower chair  OCCUPATION: On disability   PLOF: Independent  PATIENT GOALS: Decrease knee pain and improve knee leg strength  NEXT MD VISIT: TBD  OBJECTIVE:  Note: Objective measures were completed at Evaluation unless otherwise noted.  DIAGNOSTIC FINDINGS: 09/23/2023 09:29:00 AM   COMPARISON: None available.   CLINICAL HISTORY: Right knee pain, ho arthritis. Right anterior knee pain x 2 months, stated when she walks it gives way, swelling, no known injury.   FINDINGS:   BONES AND JOINTS: Mild tricompartmental degenerative changes, most prominent in the patellofemoral compartment. Moderate suprapatellar knee joint effusion. No fracture is seen.   SOFT TISSUES: The soft tissues are unremarkable.   IMPRESSION: 1. Mild degenerative changes. 2. Moderate suprapatellar knee joint effusion.   Electronically signed by: Zadie Herter MD 09/23/2023 10:16 AM EDT RP Workstation: [id number]  PATIENT SURVEYS:  LEFS 35/80 43.8%  COGNITION: Overall cognitive status: Within functional limits for tasks assessed     SENSATION: WFL  EDEMA:  No swelling of knee  MUSCLE LENGTH: Hamstrings: Bil tightness    POSTURE: rounded  shoulders, forward head, and obesity  PALPATION: Global tendernss over Right knee  LOWER EXTREMITY ROM:  Active ROM Right eval Left eval  Hip flexion    Hip extension    Hip abduction    Hip adduction    Hip internal rotation    Hip external rotation    Knee flexion 117/P125 126/P 133  Knee extension 0 0  Ankle dorsiflexion    Ankle plantarflexion    Ankle inversion    Ankle eversion     (Blank rows = not tested)  LOWER EXTREMITY MMT:  MMT Right eval Left eval  Hip flexion 4+ 4+  Hip extension 4 4+  Hip abduction 4 4  Hip adduction    Hip internal rotation    Hip external rotation    Knee flexion 4 5  Knee extension 4 5  Ankle dorsiflexion    Ankle plantarflexion 5/25 12/25  Ankle inversion    Ankle eversion     (  Blank rows = not tested)  LOWER EXTREMITY SPECIAL TESTS:  NT FUNCTIONAL TESTS:  5 times sit to stand: 23.16 seconds 2 minute walk test: 349.7 (546 ftnorm)  Squat  flex/knee/hip about 60 degrees and then too painful GAIT: Distance walked: 349.7 ft  Assistive device utilized: None Level of assistance: Complete Independence Comments: Pt with slow cadence and slight antalgic gait                                                                                                                                TREATMENT DATE:   [facility] Adult PT Treatment  10/15/2023:  Therapeutic Exercise:  nu-step L5 59m while taking subjective and planning session with patient Quad set with towel under knee - 5'' hold - 2x10 SLR with quad set - 2x10 - small arc Bridge on small swiss ball at knees - 2x10 S/L clam - YTB - 2x10 ea Hip adduction squeeze - pilates ring - 3'' hold 2x10 LAQ - 5# - 2'' hold - 3x10 HS curl - Black TB Education regarding benefits of strengthening and continuing with activity    HOME EXERCISE PROGRAM: Access Code: [email or web address] URL: [email or web address] Date: 10/07/2023 Prepared by: Sharlet Dawson  Exercises - Supine  Active Straight Leg Raise  - 1 x daily - 7 x weekly - 3 sets - 10 reps - Sidelying Hip Adduction  - 1 x daily - 7 x weekly - 3 sets - 10 reps - Seated Long Arc Quad with Ankle Weight  - 1 x daily - 7 x weekly - 3 sets - 10 reps - Sit to stand with sink support Movement snack  - 1 x daily - 7 x weekly - 3 sets - 10 reps - Heel raise with counter support and towel under toes  - 1 x daily - 7 x weekly - 3 sets - 15 reps  ASSESSMENT:  CLINICAL IMPRESSION:  10/15/2023: Melenda tolerated session well with no adverse reaction.  She continues to report high pain levels bil but is able to complete all exercises with good form.  Discussed the need and benefit of progressive strengthening and activity.  Introduced Optometrist and reviewed HEP.    EVAL: Patient is a 63 y.o. female who was seen today for physical therapy evaluation and treatment for right knee pain. Pt demonstrates slight antalgic gait and limitations in standing and walking due to pain.  Pt cares for grandchildren and needs to be more mobile. Pt will benefit from skilled PT to address impairments and return to a more active and healthier lifestyle  OBJECTIVE IMPAIRMENTS: decreased activity tolerance, decreased balance, decreased mobility, difficulty walking, decreased ROM, decreased strength, hypomobility, postural dysfunction, obesity, and pain.   ACTIVITY LIMITATIONS: carrying, standing, squatting, stairs, locomotion level, and caring for others  PARTICIPATION LIMITATIONS: meal prep, cleaning, laundry, driving, community activity, and caring for grandchildren  PERSONAL FACTORS: Fibromyalgia, OA, depression are also affecting patient's functional outcome.   REHAB  POTENTIAL: Good  CLINICAL DECISION MAKING: Evolving/moderate complexity  EVALUATION COMPLEXITY: Moderate   GOALS: Goals reviewed with patient? Yes  SHORT TERM GOALS: Target date: 10-30-23 Pt will be able to  perform 5 x STS in 15 sec or less Baseline:23.16  sec Goal status: INITIAL  2.  Pt will independent with initial HEP Baseline: limited knowledge Goal status: INITIAL  3.  PT will be educated on walking program for routine exercise and movement Baseline: Pt with no routine exercise Goal status: INITIAL    LONG TERM GOALS: Target date: 11-20-23  Pt will be able to perform advanced HEP I Baseline: limited knowledge Goal status: INITIAL  2.  Pt will be able to stand for 30 minutes to be able to complete household chores with 3/10 pain or less Baseline: Pt reports 8/10 to 10/10 pain Goal status: INITIAL  3.  Pt will be able to walk at least 3 x week for at least 25 minutes without stopping Baseline: no current exercise program limiting walking Goal status: INITIAL  4.  Pt will be able to perform single leg heel raise on Right knee 25 x Baseline:  5 x at eval with pain on R Goal status: INITIAL  5.  Pt will be able to perform 6 MWT within nom Baseline: 349;7 ft Goal status: INITIAL  6.  LEFS will improve to at least 57% Baseline: eval 35/80  43.8% Goal status: INITIAL   PLAN: Progress HEP and strength  PT FREQUENCY: 1-2x/week  PT DURATION: 6 weeks  PLANNED INTERVENTIONS: 97164- PT Re-evaluation, 97750- Physical Performance Testing, 97110-Therapeutic exercises, 97530- Therapeutic activity, [id number]- Neuromuscular re-education, 97535- Self Care, 96045- Manual therapy, [id number]- Gait training, [phone]- Aquatic Therapy, [phone]- Electrical stimulation (manual), Patient/Family education, Balance training, Stair training, Taping, Dry Needling, Joint mobilization, Cryotherapy, and Moist heat  PLAN FOR NEXT SESSION: Progress HEP and taping as needed.  Education on [employer] PT 10/15/23 4:22 PM Phone: [phone] Fax: [phone]

## 2023-10-21 ENCOUNTER — Other Ambulatory Visit: Payer: Self-pay | Admitting: Licensed Clinical Social Worker

## 2023-10-21 ENCOUNTER — Ambulatory Visit: Admitting: Physical Therapy

## 2023-10-21 DIAGNOSIS — F331 Major depressive disorder, recurrent, moderate: Secondary | ICD-10-CM

## 2023-10-21 NOTE — Therapy (Signed)
 OUTPATIENT PHYSICAL THERAPY DAILY VISIT   Patient Name: Theresa Kirk MRN: [id number] DOB:11/01/1960, 63 y.o., female Today's Date: 10/23/2023  END OF SESSION:  PT End of Session - 10/23/23 0823     Visit Number 2    Number of Visits 12    Date for PT Re-Evaluation 11/20/23    Authorization Type [employer] MCR/MCD Dual Complete (LEFS)    Progress Note Due on Visit 10    PT Start Time 0800    PT Stop Time 0843    PT Time Calculation (min) 43 min    Activity Tolerance Patient limited by pain;Patient tolerated treatment well    Behavior During Therapy [facility] for tasks assessed/performed              Past Medical History:  Diagnosis Date   Abnormal mammogram with microcalcification 10/15/2013   Acquired hallux limitus of both feet 10/15/2019   Acute pain of right wrist 02/03/2018   Adverse effect of drug 08/03/2023   Alcohol abuse    Allergic reaction 08/03/2023   Arthritis    Asthma    Callus of foot 06/07/2008   Qualifier: Diagnosis of  By: Derald Flattery MD, Vijay     Callus of foot 06/07/2008   Carpal tunnel syndrome    Cervical lymphadenopathy 09/21/2019   Cervical muscle pain 05/05/2014   - Pt with cervical pain radiating down her neck with no signs of radiculopathy or weakness in upper extremities   Depression    Fibromyalgia    Foot pain, left 08/12/2022   Healthcare maintenance 04/13/2012   Healthcare maintenance 04/13/2012   - Last Pap/ HPV testing in 2023      High cholesterol    Irritation symptom of skin 12/30/2017   Screening mammogram for breast cancer 08/12/2022   Past Surgical History:  Procedure Laterality Date   ABDOMINAL HYSTERECTOMY  2008   h/o uterine fibroids, ?partial hysterectomy per pt 04/13/12   CARPAL TUNNEL RELEASE Right 01/05/2021   Procedure: RIGHT CARPAL TUNNEL RELEASE;  Surgeon: Brunilda Capra, MD;  Location: [facility];  Service: Orthopedics;  Laterality: Right;  30 MIN   COLONOSCOPY     FOOT SURGERY Right    KNEE SURGERY      Patient Active Problem List   Diagnosis Date Noted   Schatzki's ring 09/26/2023   Hiatal hernia 09/26/2023   Tubular adenoma of colon 09/26/2023   Obesity (BMI 30.0-34.9) 09/23/2023   Moderate recurrent major depression (HCC) 07/14/2023   Tinea pedis of left foot 07/14/2023   Cataract, nuclear sclerotic, both eyes 03/04/2023   Restless leg syndrome 08/12/2022   Peripheral polyneuropathy 08/12/2022   Leg cramping 09/18/2021   Sensorineural hearing loss (SNHL) of both ears 01/25/2020   Tinnitus 09/21/2019   Hot flashes 04/20/2019   Numerous moles 04/20/2019   Back pain 07/29/2017   Feeling of incomplete bladder emptying 02/27/2017   Prediabetes 09/29/2014   Bilateral ocular hypertension 06/27/2014   Cervical pain (neck) 05/05/2014   Chronic cough 12/09/2012   Seasonal allergies 09/23/2012   Arthritis of right knee 03/26/2012   Carpal tunnel syndrome 06/13/2010   Hyperlipidemia 11/06/2006    PCP: Bevelyn Bryant, MD   REFERRING PROVIDER: Bevelyn Bryant, MD   REFERRING DIAG: M17.11 (ICD-10-CM) - Arthritis of right knee   THERAPY DIAG:  Chronic pain of right knee  Difficulty in walking, not elsewhere classified  Muscle weakness (generalized)  Rationale for Evaluation and Treatment: Rehabilitation  ONSET DATE: Intermittent pain over past 5 years  SUBJECTIVE:  SUBJECTIVE STATEMENT:  10-23-23   I am about a 2-3/10 in Right knee today.  A nagging ache and pressure when I walk and I can't bend down without it feeling bad.   EVAL:  I have off and on pain for my right knee for the past 5 years. I have been taking medicine but it has been so much worse in the last week. I have been here before and I got better. Pt report takes care of 5 grandchildren with my husband and it has been more difficult to do it because of my knee pain.  I left my cane at home. I just started using it. Uses shower chair to keep from slipping.I help my cousin sometimes clean my cousin clean the bathrooms up  high. I avoid bending down. I dont like to stand or walk for more than 5 minutes.  PERTINENT HISTORY: Fibromyalgia, OA, depression PAIN:  Are you having pain? Yes: NPRS scale: 8/10 now and10/10 Pain location: Right knee Pain description: nagging and when standing sharp and walking pain   Aggravating factors: Standing longer than , walking, driving sometimes, mopping sweeping Relieving factors: rest, medicine Tylenol  eases it  PRECAUTIONS: None  RED FLAGS: None   WEIGHT BEARING RESTRICTIONS: No  FALLS:  Has patient fallen in last 6 months? No  LIVING ENVIRONMENT: Lives with: lives with their family Lives in: House/apartment Stairs: 3 steps to enter and avoid steps and can't visit mom due to 3 flights of stairs Has following equipment at home: Single point cane and shower chair  OCCUPATION: On disability   PLOF: Independent  PATIENT GOALS: Decrease knee pain and improve knee leg strength  NEXT MD VISIT: TBD  OBJECTIVE:  Note: Objective measures were completed at Evaluation unless otherwise noted.  DIAGNOSTIC FINDINGS: 09/23/2023 09:29:00 AM   COMPARISON: None available.   CLINICAL HISTORY: Right knee pain, ho arthritis. Right anterior knee pain x 2 months, stated when she walks it gives way, swelling, no known injury.   FINDINGS:   BONES AND JOINTS: Mild tricompartmental degenerative changes, most prominent in the patellofemoral compartment. Moderate suprapatellar knee joint effusion. No fracture is seen.   SOFT TISSUES: The soft tissues are unremarkable.   IMPRESSION: 1. Mild degenerative changes. 2. Moderate suprapatellar knee joint effusion.   Electronically signed by: Zadie Herter MD 09/23/2023 10:16 AM EDT RP Workstation: [id number]  PATIENT SURVEYS:  LEFS 35/80 43.8%  COGNITION: Overall cognitive status: Within functional limits for tasks assessed     SENSATION: WFL  EDEMA:  No swelling of knee  MUSCLE LENGTH: Hamstrings:  Bil tightness    POSTURE: rounded shoulders, forward head, and obesity  PALPATION: Global tendernss over Right knee  LOWER EXTREMITY ROM:  Active ROM Right eval Left eval  Hip flexion    Hip extension    Hip abduction    Hip adduction    Hip internal rotation    Hip external rotation    Knee flexion 117/P125 126/P 133  Knee extension 0 0  Ankle dorsiflexion    Ankle plantarflexion    Ankle inversion    Ankle eversion     (Blank rows = not tested)  LOWER EXTREMITY MMT:  MMT Right eval Left eval  Hip flexion 4+ 4+  Hip extension 4 4+  Hip abduction 4 4  Hip adduction    Hip internal rotation    Hip external rotation    Knee flexion 4 5  Knee extension 4 5  Ankle dorsiflexion    Ankle  plantarflexion 5/25 12/25  Ankle inversion    Ankle eversion     (Blank rows = not tested)  LOWER EXTREMITY SPECIAL TESTS:  NT FUNCTIONAL TESTS:  5 times sit to stand: 23.16 seconds 2 minute walk test: 349.7 (546 ftnorm)  Squat  flex/knee/hip about 60 degrees and then too painful GAIT: Distance walked: 349.7 ft  Assistive device utilized: None Level of assistance: Complete Independence Comments: Pt with slow cadence and slight antalgic gait                                                                                                                                TREATMENT DATE:  [facility] Adult PT Treatment:                                                DATE: 5--22-25 Therapeutic Exercise: Quad set with towel under knee - 5'' hold - 2x10 SLR with quad set - 2x10 - small arc Bridge on small swiss ball at knees - 2x10 S/L clam - YTB - 2x10 ea Seated Hip adduction squeeze with ball 2 x 10 3 " hold LAQ - 5# cuff weight - 3'' hold - 3x10 HS curl - Black TB  Neuromuscular re-ed:  SL balance on floor (max 5-8 sec) practice for 90 sec SL balance on Therapeutic Airex pad max 5 sec practice for 90 sec Tandem stance  Therapeutic Activity: 948.2 ft  pain in Right knee  2-3/10 (Norm for age 63-1937ft) STS with 15 # KB 2 x 8 Heel raisie bil x 15 Heel raise SL on r and L x 10 each   [facility] Adult PT Treatment  10/15/2023:  Therapeutic Exercise:  nu-step L5 74m while taking subjective and planning session with patient Quad set with towel under knee - 5'' hold - 2x10 SLR with quad set - 2x10 - small arc Bridge on small swiss ball at knees - 2x10 S/L clam - RTB - 2x10 each side Hip adduction squeeze - pilates ring - 3'' hold 2x10 LAQ - 5# - 2'' hold - 3x10 HS curl - Black TB Education regarding benefits of strengthening and continuing with activity    HOME EXERCISE PROGRAM: Access Code: [email or web address] URL: [email or web address] Date: 10/07/2023 Prepared by: Sharlet Dawson  Exercises - Supine Active Straight Leg Raise  - 1 x daily - 7 x weekly - 3 sets - 10 reps - Sidelying Hip Adduction  - 1 x daily - 7 x weekly - 3 sets - 10 reps - Seated Long Arc Quad with Ankle Weight  - 1 x daily - 7 x weekly - 3 sets - 10 reps - Sit to stand with sink support Movement snack  - 1 x daily - 7 x weekly - 3 sets - 10 reps - Heel raise with counter  support and towel under toes  - 1 x daily - 7 x weekly - 3 sets - 15 reps  ASSESSMENT:  CLINICAL IMPRESSION:  10-23-23: Jemina tolerated session well with no adverse reaction.  948.2 ft  pain in Right knee 2-3/10 (Norm for age 62-1960ft) She complains of 2-3/10 pain mostly when she is walking or standing on it.  She continues to report pain levels bil but is able to complete all exercises with good form. Pt challenged with balance exercises and heel raise to fatigue today.   EVAL: Patient is a 63 y.o. female who was seen today for physical therapy evaluation and treatment for right knee pain. Pt demonstrates slight antalgic gait and limitations in standing and walking due to pain.  Pt cares for grandchildren and needs to be more mobile. Pt will benefit from skilled PT to address impairments and return to a  more active and healthier lifestyle  OBJECTIVE IMPAIRMENTS: decreased activity tolerance, decreased balance, decreased mobility, difficulty walking, decreased ROM, decreased strength, hypomobility, postural dysfunction, obesity, and pain.   ACTIVITY LIMITATIONS: carrying, standing, squatting, stairs, locomotion level, and caring for others  PARTICIPATION LIMITATIONS: meal prep, cleaning, laundry, driving, community activity, and caring for grandchildren  PERSONAL FACTORS: Fibromyalgia, OA, depression are also affecting patient's functional outcome.   REHAB POTENTIAL: Good  CLINICAL DECISION MAKING: Evolving/moderate complexity  EVALUATION COMPLEXITY: Moderate   GOALS: Goals reviewed with patient? Yes  SHORT TERM GOALS: Target date: 10-30-23 Pt will be able to  perform 5 x STS in 15 sec or less Baseline:23.16 sec Goal status: INITIAL  2.  Pt will independent with initial HEP Baseline: limited knowledge Goal status: INITIAL  3.  PT will be educated on walking program for routine exercise and movement Baseline: Pt with no routine exercise Goal status: INITIAL    LONG TERM GOALS: Target date: 11-20-23  Pt will be able to perform advanced HEP I Baseline: limited knowledge Goal status: INITIAL  2.  Pt will be able to stand for 30 minutes to be able to complete household chores with 3/10 pain or less Baseline: Pt reports 8/10 to 10/10 pain Goal status: INITIAL  3.  Pt will be able to walk at least 3 x week for at least 25 minutes without stopping Baseline: no current exercise program limiting walking Goal status: INITIAL  4.  Pt will be able to perform single leg heel raise on Right knee 25 x Baseline:  5 x at eval with pain on R Goal status: INITIAL  5.  Pt will be able to perform 6 MWT within nom Baseline: 349;7 ft Goal status: INITIAL  6.  LEFS will improve to at least 57% Baseline: eval 35/80  43.8% Goal status: INITIAL   PLAN: Progress HEP and  strength  PT FREQUENCY: 1-2x/week  PT DURATION: 6 weeks  PLANNED INTERVENTIONS: 97164- PT Re-evaluation, 97750- Physical Performance Testing, 97110-Therapeutic exercises, 97530- Therapeutic activity, [id number]- Neuromuscular re-education, 97535- Self Care, 16109- Manual therapy, [id number]- Gait training, [id number]- Aquatic Therapy, [phone]- Electrical stimulation (manual), Patient/Family education, Balance training, Stair training, Taping, Dry Needling, Joint mobilization, Cryotherapy, and Moist heat  PLAN FOR NEXT SESSION: Progress HEP and taping as needed.  Education on OA  Sharlet Dawson, PT, [facility] Certified Exercise Expert for the Aging Adult  10/23/23 8:47 AM Phone: [phone] Fax: [phone]

## 2023-10-21 NOTE — Patient Instructions (Signed)
 Visit Information  Thank you for taking time to visit with me today. Please don't hesitate to contact me if I can be of assistance to you before our next scheduled appointment.  Our next appointment is by telephone on 06/17 at 3 PM Please call the care guide team at [phone] if you need to cancel or reschedule your appointment.   Following is a copy of your care plan:   Goals Addressed             This Visit's Progress    LCSW VBCI Social Work Care Plan   On track    Problems:   Disease Management support and education needs related to Depression: depressed mood  CSW Clinical Goal(s):   Over the next 60 days the Patient will attend all scheduled medical appointments as evidenced by patient report and care team review of appointment completion in electronic MEDICAL RECORD NUMBER  demonstrate a reduction in symptoms related to Depression: depressed mood .  Interventions:  Mental Health:  Evaluation of current treatment plan related to Depression: depressed mood Active listening / Reflection utilized Financial risk analyst / information provided Emotional Support Provided Problem Solving /Task Center strategies reviewed Provided general psycho-education for mental health needs  Patient Goals/Self-Care Activities:  Continue taking your medication as prescribed.   Increase coping skills and stress reduction  Plan:   Telephone follow up appointment with care management team member scheduled for:  2-4 weeks        Please call the Suicide and Crisis Lifeline: 988 go to [facility] [address] [place] [phone]) call 911 if you are experiencing a Mental Health or Behavioral Health Crisis or need someone to talk to.  The patient verbalized understanding of instructions, educational materials, and care plan provided today and DECLINED offer to receive copy of patient instructions, educational materials, and care plan.    Arlis Bent [facility]  [facility], [facility] Clinical Social Worker Direct Dial: [phone]  Fax: [phone] Website: [email or web address] 6:01 PM

## 2023-10-21 NOTE — Patient Outreach (Signed)
 Complex Care Management   Visit Note  10/21/2023  Name:  Theresa Kirk MRN: [id number] DOB: 01/23/1961  Situation: Referral received for Complex Care Management related to Mental/Behavioral Health diagnosis MDD I obtained verbal consent from Patient.  Visit completed with pt  on the phone  Background:   Past Medical History:  Diagnosis Date   Abnormal mammogram with microcalcification 10/15/2013   Acquired hallux limitus of both feet 10/15/2019   Acute pain of right wrist 02/03/2018   Adverse effect of drug 08/03/2023   Alcohol abuse    Allergic reaction 08/03/2023   Arthritis    Asthma    Callus of foot 06/07/2008   Qualifier: Diagnosis of  By: Derald Flattery MD, Vijay     Callus of foot 06/07/2008   Carpal tunnel syndrome    Cervical lymphadenopathy 09/21/2019   Cervical muscle pain 05/05/2014   - Pt with cervical pain radiating down her neck with no signs of radiculopathy or weakness in upper extremities   Depression    Fibromyalgia    Foot pain, left 08/12/2022   Healthcare maintenance 04/13/2012   Healthcare maintenance 04/13/2012   - Last Pap/ HPV testing in 2023      High cholesterol    Irritation symptom of skin 12/30/2017   Screening mammogram for breast cancer 08/12/2022    Assessment: Patient Reported Symptoms:  Cognitive Cognitive Status: Alert and oriented to person, place, and time, Normal speech and language skills Cognitive/Intellectual Conditions Management [RPT]: None reported or documented in medical history or problem list      Neurological Neurological Review of Symptoms: No symptoms reported    HEENT HEENT Symptoms Reported: No symptoms reported      Cardiovascular Cardiovascular Symptoms Reported: No symptoms reported    Respiratory Respiratory Symptoms Reported: No symptoms reported    Endocrine Patient reports the following symptoms related to hypoglycemia or hyperglycemia : No symptoms reported    Gastrointestinal Gastrointestinal Symptoms  Reported: No symptoms reported      Genitourinary Genitourinary Symptoms Reported: No symptoms reported    Integumentary Integumentary Symptoms Reported: No symptoms reported    Musculoskeletal Musculoskelatal Symptoms Reviewed: No symptoms reported        Psychosocial Psychosocial Symptoms Reported: Depression - if selected complete PHQ 2-9, Anxiety - if selected complete GAD Additional Psychological Details: Pt reports hx of MDD and Anxiety symptoms, stating "symptoms are about the same" as last year. Patient reports need for utility assistance to assist her with obtaining independent housing. AMB referral completed. Healthy coping skills discussed to assist with symptom management Behavioral Health Conditions: Depression Behavioral Management Strategies: Adequate rest, Community resources, Coping strategies Major Change/Loss/Stressor/Fears (CP): Medical condition, self, Resources Quality of Family Relationships: involved Do you feel physically threatened by others?: No      09/23/2023    8:16 AM  Depression screen PHQ 2/9  Decreased Interest 1  Down, Depressed, Hopeless 1  PHQ - 2 Score 2  Altered sleeping 3  Tired, decreased energy 2  Change in appetite 1  Feeling bad or failure about yourself  3  Trouble concentrating 3  Moving slowly or fidgety/restless 0  Suicidal thoughts 0  PHQ-9 Score 14  Difficult doing work/chores Somewhat difficult    There were no vitals filed for this visit.  Medications Reviewed Today     Reviewed by Adriana Albany, LCSW (Social Worker) on 10/21/23 at 1525  Med List Status: <None>   Medication Order Taking? Sig Documenting Provider Last Dose Status Informant  albuterol  (VENTOLIN  HFA)  108 (90 Base) MCG/ACT inhaler [id number] Yes Inhale 1-2 puffs into the lungs every 6 (six) hours as needed for wheezing or shortness of breath. Driscilla George, MD Taking Active   clotrimazole  (LOTRIMIN ) 1 % cream [id number] Yes Apply 1 Application topically 2  (two) times daily. Driscilla George, MD Taking Active   diclofenac  Sodium (VOLTAREN ) 1 % GEL [id number] Yes Apply 4 g topically 4 (four) times daily. Driscilla George, MD Taking Active   pantoprazole  (PROTONIX ) 40 MG tablet [id number] Yes TAKE 1 TABLET BY MOUTH EVERY DAY(TAKE 30 MINS BEFORE MEALS) Katsadouros, Vasilios, MD Taking Active   rosuvastatin  (CRESTOR ) 10 MG tablet [id number] Yes Take 1 tablet (10 mg total) by mouth daily. Bevelyn Bryant, MD Taking Active             Recommendation:   Continue utilizing strategies discussed to assist with symptom management  Follow Up Plan:   Telephone follow-up in 1 month  Alease Hunter, LCSW [facility]  [facility], [facility] Clinical Social Worker Direct Dial: [phone]  Fax: [phone] Website: [email or web address] 6:01 PM

## 2023-10-22 ENCOUNTER — Telehealth: Payer: Self-pay

## 2023-10-22 NOTE — Progress Notes (Signed)
   Telephone encounter was:  Successful.  Complex Care Management Note Care Guide Note  10/22/2023 Name: Melvie Paglia MRN: [id number] DOB: Jul 30, 1960  Roniqua Hogle is a 63 y.o. year old female who is a primary care patient of Bevelyn Bryant, MD . The community resource team was consulted for assistance with Financial Difficulties related to financial strain  SDOH screenings and interventions completed:  Yes  Social Drivers of Health From This Encounter   Financial Resource Strain: High Risk (10/22/2023)   Overall Financial Resource Strain (CARDIA)    Difficulty of Paying Living Expenses: Very hard  Utilities: At Risk (10/22/2023)   Utilities    Threatened with loss of utilities: Yes    SDOH Interventions Today    Flowsheet Row Most Recent Value  SDOH Interventions   Utilities Interventions Community Resources Provided, [id number] Referral  Financial Strain Interventions [id number] Referral, Community Resources Provided        Care guide performed the following interventions: Patient provided with information about care guide support team and interviewed to confirm resource needs. Pt needs assistance with utilities her power needs to be paid so she can find other housing. Pt requested I mail the resources to her. I did give her online resources over the phone.   Follow Up Plan:  No further follow up planned at this time. The patient has been provided with needed resources.  Encounter Outcome:  Patient Visit Completed    Azell Leopard [facility]  [facility] Guide, Phone: [phone] Fax: [phone] Website: [email or web address]

## 2023-10-23 ENCOUNTER — Encounter: Payer: Self-pay | Admitting: Physical Therapy

## 2023-10-23 ENCOUNTER — Ambulatory Visit: Admitting: Physical Therapy

## 2023-10-23 DIAGNOSIS — M6281 Muscle weakness (generalized): Secondary | ICD-10-CM

## 2023-10-23 DIAGNOSIS — M25561 Pain in right knee: Secondary | ICD-10-CM | POA: Diagnosis not present

## 2023-10-23 DIAGNOSIS — R262 Difficulty in walking, not elsewhere classified: Secondary | ICD-10-CM

## 2023-10-23 DIAGNOSIS — M1711 Unilateral primary osteoarthritis, right knee: Secondary | ICD-10-CM | POA: Diagnosis not present

## 2023-10-23 DIAGNOSIS — G8929 Other chronic pain: Secondary | ICD-10-CM | POA: Diagnosis not present

## 2023-10-28 ENCOUNTER — Ambulatory Visit: Admitting: Physical Therapy

## 2023-10-30 ENCOUNTER — Ambulatory Visit: Admitting: Physical Therapy

## 2023-10-30 ENCOUNTER — Encounter: Payer: Self-pay | Admitting: Physical Therapy

## 2023-10-30 DIAGNOSIS — M1711 Unilateral primary osteoarthritis, right knee: Secondary | ICD-10-CM | POA: Diagnosis not present

## 2023-10-30 DIAGNOSIS — R262 Difficulty in walking, not elsewhere classified: Secondary | ICD-10-CM | POA: Diagnosis not present

## 2023-10-30 DIAGNOSIS — G8929 Other chronic pain: Secondary | ICD-10-CM

## 2023-10-30 DIAGNOSIS — M6281 Muscle weakness (generalized): Secondary | ICD-10-CM | POA: Diagnosis not present

## 2023-10-30 DIAGNOSIS — M25561 Pain in right knee: Secondary | ICD-10-CM | POA: Diagnosis not present

## 2023-10-30 NOTE — Patient Instructions (Signed)
WALKING  Walking is a great form of exercise to increase your strength, endurance and overall fitness.  A walking program can help you start slowly and gradually build endurance as you go.  Everyone's ability is different, so each person's starting point will be different.  You do not have to follow them exactly.  The are just samples. You should simply find out what's right for you and stick to that program.   In the beginning, you'll start off walking 2-3 times a day for short distances.  As you get stronger, you'll be walking further at just 1-2 times per day.  A. You Can Walk For A Certain Length Of Time Each Day    Walk 5 minutes 3 times per day.  Increase 2 minutes every 2 days (3 times per day).  Work up to 25-30 minutes (1-2 times per day).   Example:   Day 1-2 5 minutes 3 times per day   Day 7-8 12 minutes 2-3 times per day   Day 13-14 25 minutes 1-2 times per day  B. You Can Walk For a Certain Distance Each Day     Distance can be substituted for time.    Example:   3 trips to mailbox (at road)   3 trips to corner of block   3 trips around the block  C. Go to local [employer] and use the track.    Walk for distance ____ around track  Or time ____ minutes  D. Walk ____ Jog ____ Run ___  Please only do the exercises that your therapist has initialed and dated  

## 2023-10-30 NOTE — Therapy (Signed)
 OUTPATIENT PHYSICAL THERAPY DAILY VISIT   Patient Name: Theresa Kirk MRN: [id number] DOB:11-11-1960, 63 y.o., female Today's Date: 10/30/2023  END OF SESSION:  PT End of Session - 10/30/23 1449     Visit Number 3    Number of Visits 12    Date for PT Re-Evaluation 11/20/23    Authorization Type [employer] MCR/MCD Dual Complete (LEFS)    Progress Note Due on Visit 10    PT Start Time 0248    PT Stop Time 0326    PT Time Calculation (min) 38 min              Past Medical History:  Diagnosis Date   Abnormal mammogram with microcalcification 10/15/2013   Acquired hallux limitus of both feet 10/15/2019   Acute pain of right wrist 02/03/2018   Adverse effect of drug 08/03/2023   Alcohol abuse    Allergic reaction 08/03/2023   Arthritis    Asthma    Callus of foot 06/07/2008   Qualifier: Diagnosis of  By: Derald Flattery MD, Vijay     Callus of foot 06/07/2008   Carpal tunnel syndrome    Cervical lymphadenopathy 09/21/2019   Cervical muscle pain 05/05/2014   - Pt with cervical pain radiating down her neck with no signs of radiculopathy or weakness in upper extremities   Depression    Fibromyalgia    Foot pain, left 08/12/2022   Healthcare maintenance 04/13/2012   Healthcare maintenance 04/13/2012   - Last Pap/ HPV testing in 2023      High cholesterol    Irritation symptom of skin 12/30/2017   Screening mammogram for breast cancer 08/12/2022   Past Surgical History:  Procedure Laterality Date   ABDOMINAL HYSTERECTOMY  2008   h/o uterine fibroids, ?partial hysterectomy per pt 04/13/12   CARPAL TUNNEL RELEASE Right 01/05/2021   Procedure: RIGHT CARPAL TUNNEL RELEASE;  Surgeon: Brunilda Capra, MD;  Location: [facility];  Service: Orthopedics;  Laterality: Right;  30 MIN   COLONOSCOPY     FOOT SURGERY Right    KNEE SURGERY     Patient Active Problem List   Diagnosis Date Noted   Schatzki's ring 09/26/2023   Hiatal hernia 09/26/2023   Tubular adenoma of colon  09/26/2023   Obesity (BMI 30.0-34.9) 09/23/2023   Moderate recurrent major depression (HCC) 07/14/2023   Tinea pedis of left foot 07/14/2023   Cataract, nuclear sclerotic, both eyes 03/04/2023   Restless leg syndrome 08/12/2022   Peripheral polyneuropathy 08/12/2022   Leg cramping 09/18/2021   Sensorineural hearing loss (SNHL) of both ears 01/25/2020   Tinnitus 09/21/2019   Hot flashes 04/20/2019   Numerous moles 04/20/2019   Back pain 07/29/2017   Feeling of incomplete bladder emptying 02/27/2017   Prediabetes 09/29/2014   Bilateral ocular hypertension 06/27/2014   Cervical pain (neck) 05/05/2014   Chronic cough 12/09/2012   Seasonal allergies 09/23/2012   Arthritis of right knee 03/26/2012   Carpal tunnel syndrome 06/13/2010   Hyperlipidemia 11/06/2006    PCP: Bevelyn Bryant, MD   REFERRING PROVIDER: Bevelyn Bryant, MD   REFERRING DIAG: M17.11 (ICD-10-CM) - Arthritis of right knee   THERAPY DIAG:  Chronic pain of right knee  Difficulty in walking, not elsewhere classified  Rationale for Evaluation and Treatment: Rehabilitation  ONSET DATE: Intermittent pain over past 5 years  SUBJECTIVE:   SUBJECTIVE STATEMENT: 10/30/23: I am just doing this so my doctor will stop asking me about it. It is not going to help. My leg  hurt all day after the last time I was here. PT never helps or helps a little and then the pain comes back. I have not done the exercises because I have been busy. Pain 4/10.   10-23-23   I am about a 2-3/10 in Right knee today.  A nagging ache and pressure when I walk and I can't bend down without it feeling bad.   EVAL:  I have off and on pain for my right knee for the past 5 years. I have been taking medicine but it has been so much worse in the last week. I have been here before and I got better. Pt report takes care of 5 grandchildren with my husband and it has been more difficult to do it because of my knee pain.  I left my cane at home. I just started using it.  Uses shower chair to keep from slipping.I help my cousin sometimes clean my cousin clean the bathrooms up high. I avoid bending down. I dont like to stand or walk for more than 5 minutes.  PERTINENT HISTORY: Fibromyalgia, OA, depression PAIN:  Are you having pain? Yes: NPRS scale: 8/10 now and10/10 Pain location: Right knee Pain description: nagging and when standing sharp and walking pain   Aggravating factors: Standing longer than , walking, driving sometimes, mopping sweeping Relieving factors: rest, medicine Tylenol  eases it  PRECAUTIONS: None  RED FLAGS: None   WEIGHT BEARING RESTRICTIONS: No  FALLS:  Has patient fallen in last 6 months? No  LIVING ENVIRONMENT: Lives with: lives with their family Lives in: House/apartment Stairs: 3 steps to enter and avoid steps and can't visit mom due to 3 flights of stairs Has following equipment at home: Single point cane and shower chair  OCCUPATION: On disability   PLOF: Independent  PATIENT GOALS: Decrease knee pain and improve knee leg strength  NEXT MD VISIT: TBD  OBJECTIVE:  Note: Objective measures were completed at Evaluation unless otherwise noted.  DIAGNOSTIC FINDINGS: 09/23/2023 09:29:00 AM   COMPARISON: None available.   CLINICAL HISTORY: Right knee pain, ho arthritis. Right anterior knee pain x 2 months, stated when she walks it gives way, swelling, no known injury.   FINDINGS:   BONES AND JOINTS: Mild tricompartmental degenerative changes, most prominent in the patellofemoral compartment. Moderate suprapatellar knee joint effusion. No fracture is seen.   SOFT TISSUES: The soft tissues are unremarkable.   IMPRESSION: 1. Mild degenerative changes. 2. Moderate suprapatellar knee joint effusion.   Electronically signed by: Zadie Herter MD 09/23/2023 10:16 AM EDT RP Workstation: [id number]  PATIENT SURVEYS:  LEFS 35/80 43.8%  COGNITION: Overall cognitive status: Within functional limits  for tasks assessed     SENSATION: WFL  EDEMA:  No swelling of knee  MUSCLE LENGTH: Hamstrings: Bil tightness    POSTURE: rounded shoulders, forward head, and obesity  PALPATION: Global tendernss over Right knee  LOWER EXTREMITY ROM:  Active ROM Right eval Left eval  Hip flexion    Hip extension    Hip abduction    Hip adduction    Hip internal rotation    Hip external rotation    Knee flexion 117/P125 126/P 133  Knee extension 0 0  Ankle dorsiflexion    Ankle plantarflexion    Ankle inversion    Ankle eversion     (Blank rows = not tested)  LOWER EXTREMITY MMT:  MMT Right eval Left eval  Hip flexion 4+ 4+  Hip extension 4 4+  Hip abduction 4  4  Hip adduction    Hip internal rotation    Hip external rotation    Knee flexion 4 5  Knee extension 4 5  Ankle dorsiflexion    Ankle plantarflexion 5/25 12/25  Ankle inversion    Ankle eversion     (Blank rows = not tested)  LOWER EXTREMITY SPECIAL TESTS:  NT FUNCTIONAL TESTS:  5 times sit to stand: 23.16 seconds: 10/30/23: 14.5 sec 2 minute walk test: 349.7 (546 ftnorm)  Squat  flex/knee/hip about 60 degrees and then too painful GAIT: Distance walked: 349.7 ft  Assistive device utilized: None Level of assistance: Complete Independence Comments: Pt with slow cadence and slight antalgic gait                                                                                                                                TREATMENT DATE:  [facility] Adult PT Treatment:                                                DATE: 10/30/23 Therapeutic Exercise: Nustep L5 UE/LE x 5 minutes  SLR 10 x 2  Hip abdct 10 x 2  Bridge 10 x 2  LAQ 5# 10 x 2  H/s curl Blue band 10 x 2  STS 15# 10 x 2  Bilateral heel raises x 15 SLS 10 sec  right  Tandem > 30 sec  eac  Self Care: Walking program reviewed and issued    [facility] Adult PT Treatment:                                                DATE: 10-23-23 Therapeutic  Exercise: Quad set with towel under knee - 5'' hold - 2x10 SLR with quad set - 2x10 - small arc Bridge on small swiss ball at knees - 2x10 S/L clam - YTB - 2x10 ea Seated Hip adduction squeeze with ball 2 x 10 3 " hold LAQ - 5# cuff weight - 3'' hold - 3x10 HS curl - Black TB  Neuromuscular re-ed:  SL balance on floor (max 5-8 sec) practice for 90 sec SL balance on Therapeutic Airex pad max 5 sec practice for 90 sec Tandem stance  Therapeutic Activity: 948.2 ft  pain in Right knee 2-3/10 (Norm for age 63-1939ft) STS with 15 # KB 2 x 8 Heel raisie bil x 15 Heel raise SL on r and L x 10 each   [facility] Adult PT Treatment  10/15/2023:  Therapeutic Exercise:  nu-step L5 63m while taking subjective and planning session with patient Quad set with towel under knee - 5'' hold - 2x10 SLR with quad set -  2x10 - small arc Bridge on small swiss ball at knees - 2x10 S/L clam - RTB - 2x10 each side Hip adduction squeeze - pilates ring - 3'' hold 2x10 LAQ - 5# - 2'' hold - 3x10 HS curl - Black TB Education regarding benefits of strengthening and continuing with activity    HOME EXERCISE PROGRAM: Access Code: [email or web address] URL: [email or web address] Date: 10/07/2023 Prepared by: Sharlet Dawson  Exercises - Supine Active Straight Leg Raise  - 1 x daily - 7 x weekly - 3 sets - 10 reps - Sidelying Hip Adduction  - 1 x daily - 7 x weekly - 3 sets - 10 reps - Seated Long Arc Quad with Ankle Weight  - 1 x daily - 7 x weekly - 3 sets - 10 reps - Sit to stand with sink support Movement snack  - 1 x daily - 7 x weekly - 3 sets - 10 reps - Heel raise with counter support and towel under toes  - 1 x daily - 7 x weekly - 3 sets - 15 reps  ASSESSMENT:  CLINICAL IMPRESSION: 5-29/25: increased pain after last session. Pt reports non-compliance with HEP. Has pain as soon as she starts walking. Does not walk for exercise. Given Handout with beginner walking program today. Tolerated  session well until closed chain exercise portion, which increased her pain. Pt is eager to finish PT in hopes to receive a cortisone injection. 5 x STS improved , STG #1, #3 met.   10-23-23: Eulala tolerated session well with no adverse reaction.  948.2 ft  pain in Right knee 2-3/10 (Norm for age 63-1941ft) She complains of 2-3/10 pain mostly when she is walking or standing on it.  She continues to report pain levels bil but is able to complete all exercises with good form. Pt challenged with balance exercises and heel raise to fatigue today.   EVAL: Patient is a 63 y.o. female who was seen today for physical therapy evaluation and treatment for right knee pain. Pt demonstrates slight antalgic gait and limitations in standing and walking due to pain.  Pt cares for grandchildren and needs to be more mobile. Pt will benefit from skilled PT to address impairments and return to a more active and healthier lifestyle  OBJECTIVE IMPAIRMENTS: decreased activity tolerance, decreased balance, decreased mobility, difficulty walking, decreased ROM, decreased strength, hypomobility, postural dysfunction, obesity, and pain.   ACTIVITY LIMITATIONS: carrying, standing, squatting, stairs, locomotion level, and caring for others  PARTICIPATION LIMITATIONS: meal prep, cleaning, laundry, driving, community activity, and caring for grandchildren  PERSONAL FACTORS: Fibromyalgia, OA, depression are also affecting patient's functional outcome.   REHAB POTENTIAL: Good  CLINICAL DECISION MAKING: Evolving/moderate complexity  EVALUATION COMPLEXITY: Moderate   GOALS: Goals reviewed with patient? Yes  SHORT TERM GOALS: Target date: 10-30-23 Pt will be able to  perform 5 x STS in 15 sec or less Baseline:23.16 sec 10/30/23: 14.5 sec Goal status: MET  2.  Pt will independent with initial HEP Baseline: limited knowledge 10/29/24: min to non compliance  Goal status: ONGOING  3.  PT will be educated on walking  program for routine exercise and movement Baseline: Pt with no routine exercise 10/30/23: given education Goal status: MET    LONG TERM GOALS: Target date: 11-20-23  Pt will be able to perform advanced HEP I Baseline: limited knowledge Goal status: INITIAL  2.  Pt will be able to stand for 30 minutes to be able to complete household chores with 3/10 pain  or less Baseline: Pt reports 8/10 to 10/10 pain Goal status: INITIAL  3.  Pt will be able to walk at least 3 x week for at least 25 minutes without stopping Baseline: no current exercise program limiting walking 10/30/23: given handout today Goal status: ONGOING   4.  Pt will be able to perform single leg heel raise on Right knee 25 x Baseline:  5 x at eval with pain on R Goal status: INITIAL  5.  Pt will be able to perform 6 MWT within nom Baseline: 349;7 ft Goal status: INITIAL  6.  LEFS will improve to at least 57% Baseline: eval 35/80  43.8% Goal status: INITIAL   PLAN: Progress HEP and strength  PT FREQUENCY: 1-2x/week  PT DURATION: 6 weeks  PLANNED INTERVENTIONS: [id number]- PT Re-evaluation, 97750- Physical Performance Testing, 97110-Therapeutic exercises, 97530- Therapeutic activity, [id number]- Neuromuscular re-education, 97535- Self Care, [id number]- Manual therapy, [id number]- Gait training, [phone]- Aquatic Therapy, [phone]- Electrical stimulation (manual), Patient/Family education, Balance training, Stair training, Taping, Dry Needling, Joint mobilization, Cryotherapy, and Moist heat  PLAN FOR NEXT SESSION: Progress HEP and taping as needed.  Education on [employer], [place] 10/30/23 3:28 PM Phone: [phone] Fax: [phone]

## 2023-11-04 ENCOUNTER — Ambulatory Visit: Attending: Internal Medicine | Admitting: Physical Therapy

## 2023-11-04 ENCOUNTER — Encounter: Payer: Self-pay | Admitting: Physical Therapy

## 2023-11-04 DIAGNOSIS — M25561 Pain in right knee: Secondary | ICD-10-CM | POA: Diagnosis not present

## 2023-11-04 DIAGNOSIS — G8929 Other chronic pain: Secondary | ICD-10-CM | POA: Diagnosis not present

## 2023-11-04 DIAGNOSIS — R262 Difficulty in walking, not elsewhere classified: Secondary | ICD-10-CM | POA: Insufficient documentation

## 2023-11-04 DIAGNOSIS — M6281 Muscle weakness (generalized): Secondary | ICD-10-CM | POA: Diagnosis not present

## 2023-11-04 NOTE — Therapy (Signed)
 OUTPATIENT PHYSICAL THERAPY DAILY VISIT   Patient Name: Theresa Kirk MRN: [id number] DOB:1960/06/06, 63 y.o., female Today's Date: 11/04/2023  END OF SESSION:  PT End of Session - 11/04/23 0803     Visit Number 4    Number of Visits 12    Date for PT Re-Evaluation 11/20/23    Authorization Type [employer] MCR/MCD Dual Complete (LEFS)    Progress Note Due on Visit 10    PT Start Time 0759    PT Stop Time 0837    PT Time Calculation (min) 38 min              Past Medical History:  Diagnosis Date   Abnormal mammogram with microcalcification 10/15/2013   Acquired hallux limitus of both feet 10/15/2019   Acute pain of right wrist 02/03/2018   Adverse effect of drug 08/03/2023   Alcohol abuse    Allergic reaction 08/03/2023   Arthritis    Asthma    Callus of foot 06/07/2008   Qualifier: Diagnosis of  By: Derald Flattery MD, Vijay     Callus of foot 06/07/2008   Carpal tunnel syndrome    Cervical lymphadenopathy 09/21/2019   Cervical muscle pain 05/05/2014   - Pt with cervical pain radiating down her neck with no signs of radiculopathy or weakness in upper extremities   Depression    Fibromyalgia    Foot pain, left 08/12/2022   Healthcare maintenance 04/13/2012   Healthcare maintenance 04/13/2012   - Last Pap/ HPV testing in 2023      High cholesterol    Irritation symptom of skin 12/30/2017   Screening mammogram for breast cancer 08/12/2022   Past Surgical History:  Procedure Laterality Date   ABDOMINAL HYSTERECTOMY  2008   h/o uterine fibroids, ?partial hysterectomy per pt 04/13/12   CARPAL TUNNEL RELEASE Right 01/05/2021   Procedure: RIGHT CARPAL TUNNEL RELEASE;  Surgeon: Brunilda Capra, MD;  Location: [facility];  Service: Orthopedics;  Laterality: Right;  30 MIN   COLONOSCOPY     FOOT SURGERY Right    KNEE SURGERY     Patient Active Problem List   Diagnosis Date Noted   Schatzki's ring 09/26/2023   Hiatal hernia 09/26/2023   Tubular adenoma of colon  09/26/2023   Obesity (BMI 30.0-34.9) 09/23/2023   Moderate recurrent major depression (HCC) 07/14/2023   Tinea pedis of left foot 07/14/2023   Cataract, nuclear sclerotic, both eyes 03/04/2023   Restless leg syndrome 08/12/2022   Peripheral polyneuropathy 08/12/2022   Leg cramping 09/18/2021   Sensorineural hearing loss (SNHL) of both ears 01/25/2020   Tinnitus 09/21/2019   Hot flashes 04/20/2019   Numerous moles 04/20/2019   Back pain 07/29/2017   Feeling of incomplete bladder emptying 02/27/2017   Prediabetes 09/29/2014   Bilateral ocular hypertension 06/27/2014   Cervical pain (neck) 05/05/2014   Chronic cough 12/09/2012   Seasonal allergies 09/23/2012   Arthritis of right knee 03/26/2012   Carpal tunnel syndrome 06/13/2010   Hyperlipidemia 11/06/2006    PCP: Bevelyn Bryant, MD   REFERRING PROVIDER: Bevelyn Bryant, MD   REFERRING DIAG: M17.11 (ICD-10-CM) - Arthritis of right knee   THERAPY DIAG:  Chronic pain of right knee  Difficulty in walking, not elsewhere classified  Muscle weakness (generalized)  Rationale for Evaluation and Treatment: Rehabilitation  ONSET DATE: Intermittent pain over past 5 years  SUBJECTIVE:   SUBJECTIVE STATEMENT: 11/04/23: No change. Pain is the same.    10/30/23: I am just doing this so my doctor  will stop asking me about it. It is not going to help. My leg hurt all day after the last time I was here. PT never helps or helps a little and then the pain comes back. I have not done the exercises because I have been busy. Pain 4/10.   EVAL:  I have off and on pain for my right knee for the past 5 years. I have been taking medicine but it has been so much worse in the last week. I have been here before and I got better. Pt report takes care of 5 grandchildren with my husband and it has been more difficult to do it because of my knee pain.  I left my cane at home. I just started using it. Uses shower chair to keep from slipping.I help my cousin  sometimes clean my cousin clean the bathrooms up high. I avoid bending down. I dont like to stand or walk for more than 5 minutes.  PERTINENT HISTORY: Fibromyalgia, OA, depression PAIN:  Are you having pain? Yes: NPRS scale: 8/10 now and10/10 Pain location: Right knee Pain description: nagging and when standing sharp and walking pain   Aggravating factors: Standing longer than , walking, driving sometimes, mopping sweeping Relieving factors: rest, medicine Tylenol  eases it  PRECAUTIONS: None  RED FLAGS: None   WEIGHT BEARING RESTRICTIONS: No  FALLS:  Has patient fallen in last 6 months? No  LIVING ENVIRONMENT: Lives with: lives with their family Lives in: House/apartment Stairs: 3 steps to enter and avoid steps and can't visit mom due to 3 flights of stairs Has following equipment at home: Single point cane and shower chair  OCCUPATION: On disability   PLOF: Independent  PATIENT GOALS: Decrease knee pain and improve knee leg strength  NEXT MD VISIT: TBD  OBJECTIVE:  Note: Objective measures were completed at Evaluation unless otherwise noted.  DIAGNOSTIC FINDINGS: 09/23/2023 09:29:00 AM   COMPARISON: None available.   CLINICAL HISTORY: Right knee pain, ho arthritis. Right anterior knee pain x 2 months, stated when she walks it gives way, swelling, no known injury.   FINDINGS:   BONES AND JOINTS: Mild tricompartmental degenerative changes, most prominent in the patellofemoral compartment. Moderate suprapatellar knee joint effusion. No fracture is seen.   SOFT TISSUES: The soft tissues are unremarkable.   IMPRESSION: 1. Mild degenerative changes. 2. Moderate suprapatellar knee joint effusion.   Electronically signed by: Zadie Herter MD 09/23/2023 10:16 AM EDT RP Workstation: [id number]  PATIENT SURVEYS:  LEFS 35/80 43.8%  COGNITION: Overall cognitive status: Within functional limits for tasks assessed     SENSATION: WFL  EDEMA:  No  swelling of knee  MUSCLE LENGTH: Hamstrings: Bil tightness    POSTURE: rounded shoulders, forward head, and obesity  PALPATION: Global tendernss over Right knee  LOWER EXTREMITY ROM:  Active ROM Right eval Left eval  Hip flexion    Hip extension    Hip abduction    Hip adduction    Hip internal rotation    Hip external rotation    Knee flexion 117/P125 126/P 133  Knee extension 0 0  Ankle dorsiflexion    Ankle plantarflexion    Ankle inversion    Ankle eversion     (Blank rows = not tested)  LOWER EXTREMITY MMT:  MMT Right eval Left eval Right 11/04/23  Hip flexion 4+ 4+   Hip extension 4 4+   Hip abduction 4 4   Hip adduction     Hip internal rotation  Hip external rotation     Knee flexion 4 5   Knee extension 4 5   Ankle dorsiflexion     Ankle plantarflexion 5/25 12/25 10/25  Ankle inversion     Ankle eversion      (Blank rows = not tested)  LOWER EXTREMITY SPECIAL TESTS:  NT FUNCTIONAL TESTS:  5 times sit to stand: 23.16 seconds: 10/30/23: 14.5 sec 2 minute walk test: 349.7 (546 ftnorm)  Squat  flex/knee/hip about 60 degrees and then too painful GAIT: Distance walked: 349.7 ft  Assistive device utilized: None Level of assistance: Complete Independence Comments: Pt with slow cadence and slight antalgic gait                                                                                                                                TREATMENT DATE:  [facility] Adult PT Treatment:                                                DATE: 11/04/23 Therapeutic Exercise: Nustep L5 Ue/LE x 5 minutes  Heel raise x 15 SL heel raise x 10 each  Slant board stretch SLS on AIREX 15 sec RT SLS on AIREX >40 sec LT  6# 10 x 2 LAQ RT Blue band H/S curl x 20  RT SAQ with ball squeeze RT x 12 Bridge with ball squeeze x 10 SLR 10 x 2 RT Supine clam Blue band x 20 Bridge with Blue band x 10 Side hip abdct 10 x 2 RT Hamstring stretch supine with strap  Hip flexor  stretch Supine EOM  STS 15# 10 x 2    [facility] Adult PT Treatment:                                                DATE: 10/30/23 Therapeutic Exercise: Nustep L5 UE/LE x 5 minutes  SLR 10 x 2  Hip abdct 10 x 2  Bridge 10 x 2  LAQ 5# 10 x 2  H/s curl Blue band 10 x 2  STS 15# 10 x 2  Bilateral heel raises x 15 SLS 10 sec  right  Tandem > 30 sec  eac  Self Care: Walking program reviewed and issued    [facility] Adult PT Treatment:                                                DATE: 10-23-23 Therapeutic Exercise: Quad set with towel under knee - 5'' hold - 2x10 SLR with quad set - 2x10 -  small arc Bridge on small swiss ball at knees - 2x10 S/L clam - YTB - 2x10 ea Seated Hip adduction squeeze with ball 2 x 10 3 " hold LAQ - 5# cuff weight - 3'' hold - 3x10 HS curl - Black TB  Neuromuscular re-ed:  SL balance on floor (max 5-8 sec) practice for 90 sec SL balance on Therapeutic Airex pad max 5 sec practice for 90 sec Tandem stance  Therapeutic Activity: 948.2 ft  pain in Right knee 2-3/10 (Norm for age 63-1926ft) STS with 15 # KB 2 x 8 Heel raisie bil x 15 Heel raise SL on r and L x 10 each   [facility] Adult PT Treatment  10/15/2023:  Therapeutic Exercise:  nu-step L5 68m while taking subjective and planning session with patient Quad set with towel under knee - 5'' hold - 2x10 SLR with quad set - 2x10 - small arc Bridge on small swiss ball at knees - 2x10 S/L clam - RTB - 2x10 each side Hip adduction squeeze - pilates ring - 3'' hold 2x10 LAQ - 5# - 2'' hold - 3x10 HS curl - Black TB Education regarding benefits of strengthening and continuing with activity    HOME EXERCISE PROGRAM: Access Code: [email or web address] URL: [email or web address] Date: 10/07/2023 Prepared by: Sharlet Dawson  Exercises - Supine Active Straight Leg Raise  - 1 x daily - 7 x weekly - 3 sets - 10 reps - Sidelying Hip Adduction  - 1 x daily - 7 x weekly - 3 sets - 10 reps - Seated Long Arc  Quad with Ankle Weight  - 1 x daily - 7 x weekly - 3 sets - 10 reps - Sit to stand with sink support Movement snack  - 1 x daily - 7 x weekly - 3 sets - 10 reps - Heel raise with counter support and towel under toes  - 1 x daily - 7 x weekly - 3 sets - 15 reps  ASSESSMENT:  CLINICAL IMPRESSION: 11/04/23: pt reports no change. She did a lot of walking over the weekend looking after grand babies, no formal walking program. Continued with Knee stability, strength and ROM with good tolerance. Improved single heel raise and SLS.  No complaints during session, other than muscle fatigue.    5-29/25: increased pain after last session. Pt reports non-compliance with HEP. Has pain as soon as she starts walking. Does not walk for exercise. Given Handout with beginner walking program today. Tolerated session well until closed chain exercise portion, which increased her pain. Pt is eager to finish PT in hopes to receive a cortisone injection. 5 x STS improved , STG #1, #3 met.    EVAL: Patient is a 63 y.o. female who was seen today for physical therapy evaluation and treatment for right knee pain. Pt demonstrates slight antalgic gait and limitations in standing and walking due to pain.  Pt cares for grandchildren and needs to be more mobile. Pt will benefit from skilled PT to address impairments and return to a more active and healthier lifestyle  OBJECTIVE IMPAIRMENTS: decreased activity tolerance, decreased balance, decreased mobility, difficulty walking, decreased ROM, decreased strength, hypomobility, postural dysfunction, obesity, and pain.   ACTIVITY LIMITATIONS: carrying, standing, squatting, stairs, locomotion level, and caring for others  PARTICIPATION LIMITATIONS: meal prep, cleaning, laundry, driving, community activity, and caring for grandchildren  PERSONAL FACTORS: Fibromyalgia, OA, depression are also affecting patient's functional outcome.   REHAB POTENTIAL: Good  CLINICAL DECISION MAKING:  Evolving/moderate complexity  EVALUATION COMPLEXITY: Moderate   GOALS: Goals reviewed with patient? Yes  SHORT TERM GOALS: Target date: 10-30-23 Pt will be able to  perform 5 x STS in 15 sec or less Baseline:23.16 sec 10/30/23: 14.5 sec Goal status: MET  2.  Pt will independent with initial HEP Baseline: limited knowledge 10/29/24: min to non compliance  Goal status: ONGOING  3.  PT will be educated on walking program for routine exercise and movement Baseline: Pt with no routine exercise 10/30/23: given education Goal status: MET    LONG TERM GOALS: Target date: 11-20-23  Pt will be able to perform advanced HEP I Baseline: limited knowledge Goal status: INITIAL  2.  Pt will be able to stand for 30 minutes to be able to complete household chores with 3/10 pain or less Baseline: Pt reports 8/10 to 10/10 pain Goal status: INITIAL  3.  Pt will be able to walk at least 3 x week for at least 25 minutes without stopping Baseline: no current exercise program limiting walking 10/30/23: given handout today Goal status: ONGOING   4.  Pt will be able to perform single leg heel raise on Right knee 25 x Baseline:  5 x at eval with pain on R 11/04/23: 10 x  Goal status: ONGOING  5.  Pt will be able to perform 6 MWT within nom Baseline: 349;7 ft Goal status: INITIAL  6.  LEFS will improve to at least 57% Baseline: eval 35/80  43.8% Goal status: INITIAL   PLAN: Progress HEP and strength  PT FREQUENCY: 1-2x/week  PT DURATION: 6 weeks  PLANNED INTERVENTIONS: [id number]- PT Re-evaluation, 97750- Physical Performance Testing, 97110-Therapeutic exercises, 97530- Therapeutic activity, [id number]- Neuromuscular re-education, 97535- Self Care, [id number]- Manual therapy, [id number]- Gait training, [phone]- Aquatic Therapy, [phone]- Electrical stimulation (manual), Patient/Family education, Balance training, Stair training, Taping, Dry Needling, Joint mobilization, Cryotherapy, and Moist heat  PLAN FOR  NEXT SESSION: Progress HEP and taping as needed.  Education on [employer], [place] 11/04/23 8:34 AM Phone: [phone] Fax: [phone]

## 2023-11-05 NOTE — Therapy (Addendum)
 OUTPATIENT PHYSICAL THERAPY DAILY VISIT/DISCHARGE NOTE PHYSICAL THERAPY DISCHARGE SUMMARY  Visits from Start of Care: 5  Current functional level related to goals / functional outcomes: As indicated below   Remaining deficits: Continuing knee pain seems to worsen after exercise or exertion   Education / Equipment: HEP   Patient agrees to discharge. Patient goals were Pt LTG # 1,2, 4 Partially met and # 3, 5 and 6   NOT Met. Patient is being discharged due to did not respond to therapy.     Patient Name: Theresa Kirk MRN: [id number] DOB:1960/10/20, 63 y.o., female Today's Date: 11/06/2023  END OF SESSION:  PT End of Session - 11/06/23 0756     Visit Number 5    Number of Visits 12    Date for PT Re-Evaluation 11/20/23    Authorization Type [employer] MCR/MCD Dual Complete (LEFS)    PT Start Time 0800    PT Stop Time 0844    PT Time Calculation (min) 44 min    Activity Tolerance Patient limited by pain;Patient tolerated treatment well    Behavior During Therapy [facility] for tasks assessed/performed               Past Medical History:  Diagnosis Date   Abnormal mammogram with microcalcification 10/15/2013   Acquired hallux limitus of both feet 10/15/2019   Acute pain of right wrist 02/03/2018   Adverse effect of drug 08/03/2023   Alcohol abuse    Allergic reaction 08/03/2023   Arthritis    Asthma    Callus of foot 06/07/2008   Qualifier: Diagnosis of  By: Loletta MD, Vijay     Callus of foot 06/07/2008   Carpal tunnel syndrome    Cervical lymphadenopathy 09/21/2019   Cervical muscle pain 05/05/2014   - Pt with cervical pain radiating down her neck with no signs of radiculopathy or weakness in upper extremities   Depression    Fibromyalgia    Foot pain, left 08/12/2022   Healthcare maintenance 04/13/2012   Healthcare maintenance 04/13/2012   - Last Pap/ HPV testing in 2023      High cholesterol    Irritation symptom of skin 12/30/2017   Screening mammogram for  breast cancer 08/12/2022   Past Surgical History:  Procedure Laterality Date   ABDOMINAL HYSTERECTOMY  2008   h/o uterine fibroids, ?partial hysterectomy per pt 04/13/12   CARPAL TUNNEL RELEASE Right 01/05/2021   Procedure: RIGHT CARPAL TUNNEL RELEASE;  Surgeon: Murrell Drivers, MD;  Location:  SURGERY CENTER;  Service: Orthopedics;  Laterality: Right;  30 MIN   COLONOSCOPY     FOOT SURGERY Right    KNEE SURGERY     Patient Active Problem List   Diagnosis Date Noted   Schatzki's ring 09/26/2023   Hiatal hernia 09/26/2023   Tubular adenoma of colon 09/26/2023   Obesity (BMI 30.0-34.9) 09/23/2023   Moderate recurrent major depression (HCC) 07/14/2023   Tinea pedis of left foot 07/14/2023   Cataract, nuclear sclerotic, both eyes 03/04/2023   Restless leg syndrome 08/12/2022   Peripheral polyneuropathy 08/12/2022   Leg cramping 09/18/2021   Sensorineural hearing loss (SNHL) of both ears 01/25/2020   Tinnitus 09/21/2019   Hot flashes 04/20/2019   Numerous moles 04/20/2019   Back pain 07/29/2017   Feeling of incomplete bladder emptying 02/27/2017   Prediabetes 09/29/2014   Bilateral ocular hypertension 06/27/2014   Cervical pain (neck) 05/05/2014   Chronic cough 12/09/2012   Seasonal allergies 09/23/2012   Arthritis of right knee  03/26/2012   Carpal tunnel syndrome 06/13/2010   Hyperlipidemia 11/06/2006    PCP: Karna Fellows, MD   REFERRING PROVIDER: Karna Fellows, MD   REFERRING DIAG: M17.11 (ICD-10-CM) - Arthritis of right knee   THERAPY DIAG:  Chronic pain of right knee  Difficulty in walking, not elsewhere classified  Muscle weakness (generalized)  Rationale for Evaluation and Treatment: Rehabilitation  ONSET DATE: Intermittent pain over past 5 years  SUBJECTIVE:   SUBJECTIVE STATEMENT:   11-06-23  No change Pain is the same 7/10  at worst 10/10.  I do some of the exercises but when I do them I hurt more later and it keeps me from doing my work.   I  have my  exercises to do but I am not always able to do them especially when I hurt more later.   11/04/23: No change. Pain is the same.    10/30/23: I am just doing this so my doctor will stop asking me about it. It is not going to help. My leg hurt all day after the last time I was here. PT never helps or helps a little and then the pain comes back. I have not done the exercises because I have been busy. Pain 4/10.   EVAL:  I have off and on pain for my right knee for the past 5 years. I have been taking medicine but it has been so much worse in the last week. I have been here before and I got better. Pt report takes care of 5 grandchildren with my husband and it has been more difficult to do it because of my knee pain.  I left my cane at home. I just started using it. Uses shower chair to keep from slipping.I help my cousin sometimes clean my cousin clean the bathrooms up high. I avoid bending down. I dont like to stand or walk for more than 5 minutes.  PERTINENT HISTORY: Fibromyalgia, OA, depression PAIN:  Are you having pain? Yes: NPRS scale: 8/10 now and10/10 Pain location: Right knee Pain description: nagging and when standing sharp and walking pain   Aggravating factors: Standing longer than , walking, driving sometimes, mopping sweeping Relieving factors: rest, medicine Tylenol  eases it  PRECAUTIONS: None  RED FLAGS: None   WEIGHT BEARING RESTRICTIONS: No  FALLS:  Has patient fallen in last 6 months? No  LIVING ENVIRONMENT: Lives with: lives with their family Lives in: House/apartment Stairs: 3 steps to enter and avoid steps and can't visit mom due to 3 flights of stairs Has following equipment at home: Single point cane and shower chair  OCCUPATION: On disability   PLOF: Independent  PATIENT GOALS: Decrease knee pain and improve knee leg strength  NEXT MD VISIT: TBD  OBJECTIVE:  Note: Objective measures were completed at Evaluation unless otherwise  noted.  DIAGNOSTIC FINDINGS: 09/23/2023 09:29:00 AM   COMPARISON: None available.   CLINICAL HISTORY: Right knee pain, ho arthritis. Right anterior knee pain x 2 months, stated when she walks it gives way, swelling, no known injury.   FINDINGS:   BONES AND JOINTS: Mild tricompartmental degenerative changes, most prominent in the patellofemoral compartment. Moderate suprapatellar knee joint effusion. No fracture is seen.   SOFT TISSUES: The soft tissues are unremarkable.   IMPRESSION: 1. Mild degenerative changes. 2. Moderate suprapatellar knee joint effusion.   Electronically signed by: Pinkie Pebbles MD 09/23/2023 10:16 AM EDT RP Workstation: [id number]  PATIENT SURVEYS:  LEFS 35/80 43.8% LEFS 11-06-23  42.5 %  COGNITION:  Overall cognitive status: Within functional limits for tasks assessed     SENSATION: WFL  EDEMA:  No swelling of knee  MUSCLE LENGTH: Hamstrings: Bil tightness    POSTURE: rounded shoulders, forward head, and obesity  PALPATION: Global tendernss over Right knee  LOWER EXTREMITY ROM:  Active ROM Right eval Left eval  Hip flexion    Hip extension    Hip abduction    Hip adduction    Hip internal rotation    Hip external rotation    Knee flexion 117/P125 126/P 133  Knee extension 0 0  Ankle dorsiflexion    Ankle plantarflexion    Ankle inversion    Ankle eversion     (Blank rows = not tested)  LOWER EXTREMITY MMT:  MMT Right eval Left eval Right 11/04/23 Right 11-06-23  Hip flexion 4+ 4+    Hip extension 4 4+    Hip abduction 4 4    Hip adduction      Hip internal rotation      Hip external rotation      Knee flexion 4 5    Knee extension 4 5    Ankle dorsiflexion      Ankle plantarflexion 5/25 12/25 10/25 15/25   Ankle inversion      Ankle eversion       (Blank rows = not tested)  LOWER EXTREMITY SPECIAL TESTS:  NT FUNCTIONAL TESTS:  5 times sit to stand: 23.16 seconds: 10/30/23: 14.5 sec 2 minute walk test:  349.7 (546 ftnorm)  Squat  flex/knee/hip about 60 degrees and then too painful 11-06-23 1215 ft ( norm 1635 -1963ft) 5xSTS 11:58 sec  GAIT: Distance walked: 349.7 ft  Assistive device utilized: None Level of assistance: Complete Independence Comments: Pt with slow cadence and slight antalgic gait                                                                                                                                TREATMENT DATE:  [facility] Adult PT Treatment:                                                DATE: 11-06-23 11-06-23  LEFS  42.5 % Therapeutic Exercise: Bil Heel raise x 15 SL heel raise x 10 each  Slant board stretch Prone knee flex with green strap 6# 10 x 2 LAQ RT Blue band H/S curl x 20  RT SAQ with ball squeeze RT x 12 Bridge with ball squeeze x 10 SLR 10 x 2 RT Supine clam Blue band x 20 Bridge with Blue band x 10 Side hip abd 10 x 2 RT Hamstring stretch supine with strap  STS 15# 10 x 2  Therapeutic Activity: 1215.1( norm 1635 -1925ft) Step ups forward 1 x 10 on left and right  [facility] Adult PT Treatment:                                                DATE: 11/04/23 Therapeutic Exercise: Nustep L5 Ue/LE x 5 minutes  Heel raise x 15 SL heel raise x 10 each  Slant board stretch SLS on AIREX 15 sec RT SLS on AIREX >40 sec LT  6# 10 x 2 LAQ RT Blue band H/S curl x 20  RT SAQ with ball squeeze RT x 12 Bridge with ball squeeze x 10 SLR 10 x 2 RT Supine clam Blue band x 20 Bridge with Blue band x 10 Side hip abdct 10 x 2 RT Hamstring stretch supine with strap  Hip flexor stretch Supine EOM  STS 15# 10 x 2    [facility] Adult PT Treatment:                                                DATE: 10/30/23 Therapeutic Exercise: Nustep L5 UE/LE x 5 minutes  SLR 10 x 2  Hip abdct 10 x 2  Bridge 10 x 2  LAQ 5# 10 x 2  H/s curl Blue band 10 x 2  STS 15# 10 x 2  Bilateral heel raises x 15 SLS 10 sec  right  Tandem > 30 sec  eac  Self Care: Walking  program reviewed and issued    [facility] Adult PT Treatment:                                                DATE: 10-23-23 Therapeutic Exercise: Quad set with towel under knee - 5'' hold - 2x10 SLR with quad set - 2x10 - small arc Bridge on small swiss ball at knees - 2x10 S/L clam - YTB - 2x10 ea Seated Hip adduction squeeze with ball 2 x 10 3  hold LAQ - 5# cuff weight - 3'' hold - 3x10 HS curl - Black TB  Neuromuscular re-ed:  SL balance on floor (max 5-8 sec) practice for 90 sec SL balance on Therapeutic Airex pad max 5 sec practice for 90 sec Tandem stance  Therapeutic Activity: 948.2 ft  pain in Right knee 2-3/10 (Norm for age 62-1962ft) STS with 15 # KB 2 x 8 Heel raisie bil x 15 Heel raise SL on r and L x 10 each   [facility] Adult PT Treatment  10/15/2023:  Therapeutic Exercise:  nu-step L5 57m while taking subjective and planning session with patient Quad set with towel under knee - 5'' hold - 2x10 SLR with quad set - 2x10 - small arc Bridge on small swiss ball at knees - 2x10 S/L clam - RTB - 2x10 each side Hip adduction squeeze - pilates ring - 3'' hold 2x10 LAQ - 5# - 2'' hold - 3x10 HS curl - Black TB Education regarding benefits of strengthening and continuing with activity    HOME EXERCISE PROGRAM: Access Code: [email or web address] updated 11-06-23 URL: [email or web address] Date: 11/06/2023 Prepared by: Graydon Dingwall  Exercises - Prone Quadriceps Stretch with Strap  - 1 x daily - 7  x weekly - 1 sets - 2-3 reps - 30-60 sec hold - Gastroc Stretch on Wall  - 1 x daily - 7 x weekly - 1 sets - 3 reps - 30 sec hold - Supine Active Straight Leg Raise  - 1 x daily - 7 x weekly - 3 sets - 10 reps - Sidelying Hip Adduction  - 1 x daily - 7 x weekly - 3 sets - 10 reps - Sit to stand with sink support Movement snack  - 1 x daily - 7 x weekly - 3 sets - 10 reps - Heel raise with counter support and towel under toes  - 1 x daily - 7 x weekly - 3 sets - 15 reps -  Seated Knee Extension with Resistance  - 1 x daily - 7 x weekly - 3 sets - 10 reps - Forward Step Up with Counter Support  - 1 x daily - 7 x weekly - 3 sets - 10 reps  ASSESSMENT:  CLINICAL IMPRESSION:  11-06-23  Pt reports no change although able to do better but not within normal range.  Pt states she is not able to do all her exercises due to pain. She does exercises well in clinic and HEP updated today to increase challenge but pt states she has more pain later and cannot complete tasks for her day.  Pt agrees to DC and to return to MD for further evaluation and possible injection for pain.  Pt states she is able to do her HEP at home and prefers to DC and complete her exercise at home. LEFS today 1% worse than on eval. 5xSTS 11:58 sec improved.  Pt agrees with DC since she is not responding to therapy and she is I with HEP   11/04/23: pt reports no change. She did a lot of walking over the weekend looking after grand babies, no formal walking program. Continued with Knee stability, strength and ROM with good tolerance. Improved single heel raise and SLS.  No complaints during session, other than muscle fatigue.    5-29/25: increased pain after last session. Pt reports non-compliance with HEP. Has pain as soon as she starts walking. Does not walk for exercise. Given Handout with beginner walking program today. Tolerated session well until closed chain exercise portion, which increased her pain. Pt is eager to finish PT in hopes to receive a cortisone injection. 5 x STS improved , STG #1, #3 met.    EVAL: Patient is a 63 y.o. female who was seen today for physical therapy evaluation and treatment for right knee pain. Pt demonstrates slight antalgic gait and limitations in standing and walking due to pain.  Pt cares for grandchildren and needs to be more mobile. Pt will benefit from skilled PT to address impairments and return to a more active and healthier lifestyle  OBJECTIVE IMPAIRMENTS:  decreased activity tolerance, decreased balance, decreased mobility, difficulty walking, decreased ROM, decreased strength, hypomobility, postural dysfunction, obesity, and pain.   ACTIVITY LIMITATIONS: carrying, standing, squatting, stairs, locomotion level, and caring for others  PARTICIPATION LIMITATIONS: meal prep, cleaning, laundry, driving, community activity, and caring for grandchildren  PERSONAL FACTORS: Fibromyalgia, OA, depression are also affecting patient's functional outcome.   REHAB POTENTIAL: Good  CLINICAL DECISION MAKING: Evolving/moderate complexity  EVALUATION COMPLEXITY: Moderate   GOALS: Goals reviewed with patient? Yes  SHORT TERM GOALS: Target date: 10-30-23 Pt will be able to  perform 5 x STS in 15 sec or less Baseline:23.16 sec 10/30/23: 14.5  sec Goal status: MET  2.  Pt will independent with initial HEP Baseline: limited knowledge 10/29/24: min to non compliance  Goal status: MET 11-06-23  3.  PT will be educated on walking program for routine exercise and movement Baseline: Pt with no routine exercise 10/30/23: given education Goal status: MET    LONG TERM GOALS: Target date: 11-20-23  Pt will be able to perform advanced HEP I Baseline: limited knowledge Goal status: Partially MET  2.  Pt will be able to stand for 30 minutes to be able to complete household chores with 3/10 pain or less Baseline: Pt reports 8/10 to 10/10 pain 11-06-23  Pt pain is never lower than 5/10 especially when standing can stand for 30 minutes Goal status:Partially MET  3.  Pt will be able to walk at least 3 x week for at least 25 minutes without stopping Baseline: no current exercise program limiting walking 10/30/23: given handout today 11-06-23  Pt not able to walk for exercise/ limited by pain Goal status: Not MET  4.  Pt will be able to perform single leg heel raise on Right knee 25 x Baseline:  5 x at eval with pain on R 11/04/23: 10 x  11-06-23  15 x Goal status:  Partially MET  5.  Pt will be able to perform 6 MWT within nom Baseline: 349;7 ft 11-06-23 1215.1( norm 1635 -1969ft) Goal status: Not met  6.  LEFS will improve to at least 57% Baseline: eval 35/80  43.8% 11-06-23 42.5 % Goal status:Not met   PLAN: Progress HEP and strength  PT FREQUENCY: 1-2x/week  PT DURATION: 6 weeks  PLANNED INTERVENTIONS: [id number]- PT Re-evaluation, 97750- Physical Performance Testing, 97110-Therapeutic exercises, 97530- Therapeutic activity, [id number]- Neuromuscular re-education, 97535- Self Care, [id number]- Manual therapy, [id number]- Gait training, [id number]- Aquatic Therapy, [phone]- Electrical stimulation (manual), Patient/Family education, Balance training, Stair training, Taping, Dry Needling, Joint mobilization, Cryotherapy, and Moist heat  PLAN FOR NEXT SESSION: Progress HEP and taping as needed.  Education on OA  Graydon Dingwall, PT, [facility] Certified Exercise Expert for the Aging Adult  11/06/23 1:37 PM Phone: [phone] Fax: [phone]   Graydon Dingwall, PT, [facility] Certified Exercise Expert for the Aging Adult  12/31/23 8:37 AM Phone: [phone] Fax: [phone]

## 2023-11-06 ENCOUNTER — Encounter: Payer: Self-pay | Admitting: Physical Therapy

## 2023-11-06 ENCOUNTER — Ambulatory Visit: Admitting: Physical Therapy

## 2023-11-06 DIAGNOSIS — G8929 Other chronic pain: Secondary | ICD-10-CM

## 2023-11-06 DIAGNOSIS — M6281 Muscle weakness (generalized): Secondary | ICD-10-CM | POA: Diagnosis not present

## 2023-11-06 DIAGNOSIS — R262 Difficulty in walking, not elsewhere classified: Secondary | ICD-10-CM | POA: Diagnosis not present

## 2023-11-06 DIAGNOSIS — M25561 Pain in right knee: Secondary | ICD-10-CM | POA: Diagnosis not present

## 2023-11-12 DIAGNOSIS — H25813 Combined forms of age-related cataract, bilateral: Secondary | ICD-10-CM | POA: Diagnosis not present

## 2023-11-18 ENCOUNTER — Ambulatory Visit: Payer: Self-pay

## 2023-11-18 ENCOUNTER — Other Ambulatory Visit: Payer: Self-pay | Admitting: Licensed Clinical Social Worker

## 2023-11-18 ENCOUNTER — Ambulatory Visit: Admitting: Physical Therapy

## 2023-11-18 NOTE — Patient Instructions (Signed)
 Visit Information  Thank you for taking time to visit with me today. Please don't hesitate to contact me if I can be of assistance to you before our next scheduled appointment.  Your next care management appointment is by telephone on 07/15 at 3 PM  Please call the care guide team at [phone] if you need to cancel, schedule, or reschedule an appointment.   Please call the Suicide and Crisis Lifeline: 988 go to [facility] Urgent [facility] [address] [place] [phone]) call 911 if you are experiencing a Mental Health or Behavioral Health Crisis or need someone to talk to.  Alease Hunter, LCSW [place]  [facility], [facility] Clinical Social Worker Direct Dial: [phone]  Fax: [phone] Website: [email or web address] 4:57 PM

## 2023-11-18 NOTE — Telephone Encounter (Signed)
 FYI Only or Action Required?: Action required by provider  Patient was last seen in primary care on 09/23/2023 by Bevelyn Bryant, MD. Called Nurse Triage reporting Cough and Insect Bite. Symptoms began cough started a week ago and patient reports ant bites this past Saturday. Interventions attempted: Rest, hydration, or home remedies and Ice/heat application. Symptoms are: unchanged.  Triage Disposition: See HCP Within 4 Hours (Or PCP Triage), See Physician Within 24 Hours-patient to be seen at the mobile clinic-patient given information   Patient/caregiver understands and will follow disposition?: Yes  Copied from CRM [id number]. Topic: Clinical - Red Word Triage >> Nov 18, 2023 12:13 PM Blair Bumpers wrote: Red Word that prompted transfer to Nurse Triage: Patient states she's having pains in right side and also states she needs to see her pcp also for a follow up after having a surgery on her leg. Reason for Disposition  [1] Red or very tender (to touch) area AND [2] getting larger over 48 hours after the bite  Wheezing is present  Answer Assessment - Initial Assessment Questions 1. ONSET: When did the cough begin?      Started a week ago 2. SEVERITY: How bad is the cough today?      Cough is 7 out of 10 3. SPUTUM: Describe the color of your sputum (none, dry cough; clear, white, yellow, green)     yellow 4. HEMOPTYSIS: Are you coughing up any blood? If so ask: How much? (flecks, streaks, tablespoons, etc.)     no 5. DIFFICULTY BREATHING: Are you having difficulty breathing? If Yes, ask: How bad is it? (e.g., mild, moderate, severe)    - MILD: No SOB at rest, mild SOB with walking, speaks normally in sentences, can lie down, no retractions, pulse < 100.    - MODERATE: SOB at rest, SOB with minimal exertion and prefers to sit, cannot lie down flat, speaks in phrases, mild retractions, audible wheezing, pulse 100-120.    - SEVERE: Very SOB at rest, speaks in single words, struggling to  breathe, sitting hunched forward, retractions, pulse > 120      no 6. FEVER: Do you have a fever? If Yes, ask: What is your temperature, how was it measured, and when did it start?     no 7. CARDIAC HISTORY: Do you have any history of heart disease? (e.g., heart attack, congestive heart failure)      no 8. LUNG HISTORY: Do you have any history of lung disease?  (e.g., pulmonary embolus, asthma, emphysema)     asthma 9. PE RISK FACTORS: Do you have a history of blood clots? (or: recent major surgery, recent prolonged travel, bedridden)     no 10. OTHER SYMPTOMS: Do you have any other symptoms? (e.g., runny nose, wheezing, chest pain)       wheezing 12. TRAVEL: Have you traveled out of the country in the last month? (e.g., travel history, exposures)       no  Answer Assessment - Initial Assessment Questions 1. TYPE of INSECT: What type of insect was it?      Ant bite 2. ONSET: When did you get bitten?      Saturday 3. LOCATION: Where is the insect bite located?      Bilateral feet 4. REDNESS: Is the area red or pink? If Yes, ask: What size is area of redness? (inches or cm). When did the redness start?     Red and pink 5. PAIN: Is there any pain? If Yes, ask:  How bad is it?  (Scale 1-10; or mild, moderate, severe)     no 6. ITCHING: Does it itch? If Yes, ask: How bad is the itch?    - MILD: doesn't interfere with normal activities   - MODERATE-SEVERE: interferes with work, school, sleep, or other activities      Moderate-severe 7. SWELLING: How big is the swelling? (inches, cm, or compare to coins)     Small amount of swelling 8. OTHER SYMPTOMS: Do you have any other symptoms?  (e.g., difficulty breathing, hives)     no  Protocols used: Cough - Acute Productive-A-AH, Insect Bite-A-AH

## 2023-11-20 ENCOUNTER — Ambulatory Visit (INDEPENDENT_AMBULATORY_CARE_PROVIDER_SITE_OTHER)

## 2023-11-20 ENCOUNTER — Encounter (HOSPITAL_COMMUNITY): Payer: Self-pay | Admitting: *Deleted

## 2023-11-20 ENCOUNTER — Ambulatory Visit (HOSPITAL_COMMUNITY)
Admission: EM | Admit: 2023-11-20 | Discharge: 2023-11-20 | Disposition: A | Discharge status: Home / Self Care | Attending: Emergency Medicine | Admitting: Emergency Medicine

## 2023-11-20 DIAGNOSIS — R051 Acute cough: Secondary | ICD-10-CM | POA: Diagnosis not present

## 2023-11-20 DIAGNOSIS — J069 Acute upper respiratory infection, unspecified: Secondary | ICD-10-CM

## 2023-11-20 DIAGNOSIS — J209 Acute bronchitis, unspecified: Secondary | ICD-10-CM

## 2023-11-20 DIAGNOSIS — R0602 Shortness of breath: Secondary | ICD-10-CM | POA: Diagnosis not present

## 2023-11-20 DIAGNOSIS — R918 Other nonspecific abnormal finding of lung field: Secondary | ICD-10-CM | POA: Diagnosis not present

## 2023-11-20 DIAGNOSIS — R059 Cough, unspecified: Secondary | ICD-10-CM | POA: Diagnosis not present

## 2023-11-20 DIAGNOSIS — R0989 Other specified symptoms and signs involving the circulatory and respiratory systems: Secondary | ICD-10-CM | POA: Diagnosis not present

## 2023-11-20 MED ORDER — PREDNISONE 20 MG PO TABS
40.0000 mg | ORAL_TABLET | Freq: Every day | ORAL | 0 refills | Status: AC
Start: 2023-11-20 — End: 2023-11-25

## 2023-11-20 MED ORDER — TRIAMCINOLONE ACETONIDE 0.1 % EX CREA: 1.0000 | TOPICAL_CREAM | Freq: Two times a day (BID) | CUTANEOUS | 0 refills | Status: DC

## 2023-11-20 MED ORDER — BENZONATATE 200 MG PO CAPS: 200.0000 mg | ORAL_CAPSULE | Freq: Three times a day (TID) | ORAL | 0 refills | Status: DC

## 2023-11-20 MED ORDER — AMOXICILLIN-POT CLAVULANATE 875-125 MG PO TABS: 1.0000 | ORAL_TABLET | Freq: Two times a day (BID) | ORAL | 0 refills | Status: DC

## 2023-11-20 NOTE — ED Provider Notes (Signed)
 [facility]    CSN: [id number] Arrival date & time: 11/20/23  1424      History   Chief Complaint Chief Complaint  Patient presents with   Cough    HPI Theresa Kirk is a 63 y.o. female.   Patient presents to clinic over concern of a productive cough with yellow/white sputum for the past week or so.  She does feel short of breath and like she is wheezing.  Has been taking NyQuil which help dry up the secretions, stopped this because it did not help with the coughing.  Has not had fever.   Smokes marijuana daily.  Denies history of COPD, did have asthma in childhood.  Is also concerned with unhealing and bites to the left foot.  He stepped on an ant hill 5 days ago has been using a topical cream that helps with the itching but the bumps from the bites remain.  Did contact her PCP, they had no available appointments so she came to urgent care.  The history is provided by the patient and medical records.  Cough   Past Medical History:  Diagnosis Date   Abnormal mammogram with microcalcification 10/15/2013   Acquired hallux limitus of both feet 10/15/2019   Acute pain of right wrist 02/03/2018   Adverse effect of drug 08/03/2023   Alcohol abuse    Allergic reaction 08/03/2023   Arthritis    Asthma    Callus of foot 06/07/2008   Qualifier: Diagnosis of  By: Derald Flattery MD, Vijay     Callus of foot 06/07/2008   Carpal tunnel syndrome    Cervical lymphadenopathy 09/21/2019   Cervical muscle pain 05/05/2014   - Pt with cervical pain radiating down her neck with no signs of radiculopathy or weakness in upper extremities   Depression    Fibromyalgia    Foot pain, left 08/12/2022   Healthcare maintenance 04/13/2012   Healthcare maintenance 04/13/2012   - Last Pap/ HPV testing in 2023      High cholesterol    Irritation symptom of skin 12/30/2017   Screening mammogram for breast cancer 08/12/2022    Patient Active Problem List   Diagnosis Date Noted    Schatzki's ring 09/26/2023   Hiatal hernia 09/26/2023   Tubular adenoma of colon 09/26/2023   Obesity (BMI 30.0-34.9) 09/23/2023   Moderate recurrent major depression (HCC) 07/14/2023   Tinea pedis of left foot 07/14/2023   Cataract, nuclear sclerotic, both eyes 03/04/2023   Restless leg syndrome 08/12/2022   Peripheral polyneuropathy 08/12/2022   Leg cramping 09/18/2021   Sensorineural hearing loss (SNHL) of both ears 01/25/2020   Tinnitus 09/21/2019   Hot flashes 04/20/2019   Numerous moles 04/20/2019   Back pain 07/29/2017   Feeling of incomplete bladder emptying 02/27/2017   Prediabetes 09/29/2014   Bilateral ocular hypertension 06/27/2014   Cervical pain (neck) 05/05/2014   Chronic cough 12/09/2012   Seasonal allergies 09/23/2012   Arthritis of right knee 03/26/2012   Carpal tunnel syndrome 06/13/2010   Hyperlipidemia 11/06/2006    Past Surgical History:  Procedure Laterality Date   ABDOMINAL HYSTERECTOMY  2008   h/o uterine fibroids, ?partial hysterectomy per pt 04/13/12   CARPAL TUNNEL RELEASE Right 01/05/2021   Procedure: RIGHT CARPAL TUNNEL RELEASE;  Surgeon: Brunilda Capra, MD;  Location:  [facility];  Service: Orthopedics;  Laterality: Right;  30 MIN   COLONOSCOPY     FOOT SURGERY Right    KNEE SURGERY      OB  History     Gravida  4   Para  3   Term  3   Preterm  0   AB  1   Living  3      SAB  0   IAB  1   Ectopic  0   Multiple  0   Live Births               Home Medications    Prior to Admission medications   Medication Sig Start Date End Date Taking? Authorizing Provider  amoxicillin -clavulanate (AUGMENTIN ) 875-125 MG tablet Take 1 tablet by mouth every 12 (twelve) hours. 11/20/23  Yes Nashonda Limberg  N, FNP  benzonatate  (TESSALON ) 200 MG capsule Take 1 capsule (200 mg total) by mouth every 8 (eight) hours. 11/20/23  Yes Brook Geraci  N, FNP  predniSONE  (DELTASONE ) 20 MG tablet Take 2 tablets (40 mg total) by mouth  daily for 5 days. 11/20/23 11/25/23 Yes Donata Reddick  N, FNP  triamcinolone cream (KENALOG) 0.1 % Apply 1 Application topically 2 (two) times daily. 11/20/23  Yes Steffen Hase  N, FNP  albuterol  (VENTOLIN  HFA) 108 (90 Base) MCG/ACT inhaler Inhale 1-2 puffs into the lungs every 6 (six) hours as needed for wheezing or shortness of breath. 09/24/23   Driscilla George, MD  clotrimazole  (LOTRIMIN ) 1 % cream Apply 1 Application topically 2 (two) times daily. 09/24/23   Driscilla George, MD  diclofenac  Sodium (VOLTAREN ) 1 % GEL Apply 4 g topically 4 (four) times daily. 09/24/23   Driscilla George, MD  pantoprazole  (PROTONIX ) 40 MG tablet TAKE 1 TABLET BY MOUTH EVERY DAY(TAKE 30 MINS BEFORE MEALS) 05/22/22   Katsadouros, Vasilios, MD  rosuvastatin  (CRESTOR ) 10 MG tablet Take 1 tablet (10 mg total) by mouth daily. 07/30/23 07/29/24  Bevelyn Bryant, MD    Family History Family History  Problem Relation Age of Onset   Diabetes Mother    Hypertension Mother    Stroke Mother    Diabetes Sister    Thyroid  disease Sister    Hypertension Sister    Cancer Maternal Aunt        unknown   Breast cancer Maternal Aunt     Social History Social History   Tobacco Use   Smoking status: Former    Current packs/day: 0.00    Types: Cigarettes    Quit date: 06/03/2000    Years since quitting: 23.4   Smokeless tobacco: Never  Vaping Use   Vaping status: Never Used  Substance Use Topics   Alcohol use: Yes    Comment: Sometimes.   Drug use: Yes    Types: Marijuana     Allergies   Iohexol , Ibuprofen , and Cephalexin    Review of Systems Review of Systems  Per HPI  Physical Exam Triage Vital Signs ED Triage Vitals [11/20/23 1602]  Encounter Vitals Group     BP 124/78     Girls Systolic BP Percentile      Girls Diastolic BP Percentile      Boys Systolic BP Percentile      Boys Diastolic BP Percentile      Pulse Rate 65     Resp 18     Temp 98.1 F (36.7 C)     Temp Source Oral     SpO2 98 %     Weight       Height      Head Circumference      Peak Flow      Pain Score  Pain Loc      Pain Education      Exclude from Growth Chart    No data found.  Updated Vital Signs BP 124/78 (BP Location: Right Arm)   Pulse 65   Temp 98.1 F (36.7 C) (Oral)   Resp 18   LMP 09/14/2006   SpO2 98%   Visual Acuity Right Eye Distance:   Left Eye Distance:   Bilateral Distance:    Right Eye Near:   Left Eye Near:    Bilateral Near:     Physical Exam Vitals and nursing note reviewed.  Constitutional:      Appearance: Normal appearance.  HENT:     Head: Normocephalic and atraumatic.     Right Ear: External ear normal.     Left Ear: External ear normal.     Nose: Nose normal.     Mouth/Throat:     Mouth: Mucous membranes are moist.   Eyes:     Conjunctiva/sclera: Conjunctivae normal.    Cardiovascular:     Rate and Rhythm: Normal rate and regular rhythm.     Heart sounds: Normal heart sounds. No murmur heard. Pulmonary:     Effort: Pulmonary effort is normal. No respiratory distress.     Breath sounds: Normal breath sounds.   Musculoskeletal:        General: Normal range of motion.   Skin:    General: Skin is warm and dry.     Findings: Rash present.       Neurological:     General: No focal deficit present.     Mental Status: She is alert.   Psychiatric:        Mood and Affect: Mood normal.      UC Treatments / Results  Labs (all labs ordered are listed, but only abnormal results are displayed) Labs Reviewed - No data to display  EKG   Radiology DG Chest 2 View Result Date: 11/20/2023 CLINICAL DATA:  Shortness of breath, cough, congestion for 1 week EXAM: CHEST - 2 VIEW COMPARISON:  03/15/2017 FINDINGS: Minimal central airway thickening which may reflect bronchitis or reactive airways disease. The lungs appear otherwise clear. Cardiac and mediastinal margins appear normal. No blunting of the costophrenic angles. No significant bony abnormality observed.  IMPRESSION: 1. Minimal central airway thickening which may reflect bronchitis or reactive airways disease. Electronically Signed   By: Freida Jes M.D.   On: 11/20/2023 16:49    Procedures Procedures (including critical care time)  Medications Ordered in UC Medications - No data to display  Initial Impression / Assessment and Plan / UC Course  I have reviewed the triage vital signs and the nursing notes.  Pertinent labs & imaging results that were available during my care of the patient were reviewed by me and considered in my medical decision making (see chart for details).  Vitals in triage reviewed, patient is hemodynamically stable.  Lungs vesicular, heart with regular rate and rhythm.  Chest x-ray obtained due to shortness of breath, no acute pneumonia seen.  Radiology overread consistent with bronchitis.  Due to duration of symptoms will cover with Augmentin  and prednisone  burst.  Urticarial rash to foot from ant bites, will cover with triamcinolone and the oral steroids will help as well.  Plan of care, follow-up care return precautions given, no questions at this time.     Final Clinical Impressions(s) / UC Diagnoses   Final diagnoses:  Acute cough  Acute bronchitis, unspecified organism  Upper respiratory tract  infection, unspecified type     Discharge Instructions      Radiology overread did not show any pneumonia, findings were consistent with bronchitis.  Take the antibiotics twice daily for the next 7 days.  Take the steroids daily with breakfast, starting tomorrow.  You can take the Tessalon  Perles every 8 hours to help with your cough.  Ensure you are drinking plenty of fluids to help loosen secretions, you can also sleep with a humidifier.  Use the topical steroid cream to help with itching, the oral steroids will help as well.  You can soak the foot twice daily in warm water with Epsom salt to help promote healing.  Symptoms should improve over the next  few days, if no improvement or any changes return to clinic for reevaluation.      ED Prescriptions     Medication Sig Dispense Auth. Provider   amoxicillin -clavulanate (AUGMENTIN ) 875-125 MG tablet Take 1 tablet by mouth every 12 (twelve) hours. 14 tablet Harlow Lighter, Talia Hoheisel  N, FNP   benzonatate  (TESSALON ) 200 MG capsule Take 1 capsule (200 mg total) by mouth every 8 (eight) hours. 30 capsule Harlow Lighter, Story Vanvranken  N, FNP   predniSONE  (DELTASONE ) 20 MG tablet Take 2 tablets (40 mg total) by mouth daily for 5 days. 10 tablet Harlow Lighter, Nachum Derossett  N, FNP   triamcinolone cream (KENALOG) 0.1 % Apply 1 Application topically 2 (two) times daily. 30 g Harlow Lighter, Dyami Umbach  N, FNP      PDMP not reviewed this encounter.   Harlow Lighter, Xan Sparkman  N, FNP 11/20/23 1658

## 2023-11-20 NOTE — ED Triage Notes (Signed)
 Pt states she has been coughing up yellow and white phlegm for over a week and feels sob and she also stepped in an ant pile on Saturday and her toes aren't getting better from the ant bites, she went to her PCP but they sent her here to be evaluated.  Pt states she has been taking night quill but no relief.  Denies fevers or chill , no pain in toes just itching

## 2023-11-20 NOTE — Telephone Encounter (Signed)
 Call to patient has a cough that happened after insect bites.  Yellow mucous.  No fever.  Patient was bitten by ants last Sunday.  Has been using a creme that she had used before for a bug bite.  Helps briefly and the burning and itching returns.  Informed that their are no available appointment until 12/01/2023 and that she may need to go to Urgent Care.  Will forward to her PCP to see if there are other options.

## 2023-11-20 NOTE — Discharge Instructions (Addendum)
 Radiology overread did not show any pneumonia, findings were consistent with bronchitis.  Take the antibiotics twice daily for the next 7 days.  Take the steroids daily with breakfast, starting tomorrow.  You can take the Tessalon  Perles every 8 hours to help with your cough.  Ensure you are drinking plenty of fluids to help loosen secretions, you can also sleep with a humidifier.  Use the topical steroid cream to help with itching, the oral steroids will help as well.  You can soak the foot twice daily in warm water with Epsom salt to help promote healing.  Symptoms should improve over the next few days, if no improvement or any changes return to clinic for reevaluation.

## 2023-11-20 NOTE — Telephone Encounter (Signed)
 RTC to patient advised per Dr. Ancil Balzarine to go to an Urgent Care today or tomorrow. Patient was also asked to schedule a follow up appointment after visit to the Urgent Care.

## 2023-12-03 DIAGNOSIS — Z136 Encounter for screening for cardiovascular disorders: Secondary | ICD-10-CM | POA: Diagnosis not present

## 2023-12-03 DIAGNOSIS — E559 Vitamin D deficiency, unspecified: Secondary | ICD-10-CM | POA: Diagnosis not present

## 2023-12-03 DIAGNOSIS — Z1159 Encounter for screening for other viral diseases: Secondary | ICD-10-CM | POA: Diagnosis not present

## 2023-12-03 DIAGNOSIS — Z0000 Encounter for general adult medical examination without abnormal findings: Secondary | ICD-10-CM | POA: Diagnosis not present

## 2023-12-03 DIAGNOSIS — Z79899 Other long term (current) drug therapy: Secondary | ICD-10-CM | POA: Diagnosis not present

## 2023-12-03 DIAGNOSIS — Z0189 Encounter for other specified special examinations: Secondary | ICD-10-CM | POA: Diagnosis not present

## 2023-12-16 ENCOUNTER — Other Ambulatory Visit: Payer: Self-pay | Admitting: Licensed Clinical Social Worker

## 2023-12-16 NOTE — Patient Instructions (Signed)
 Visit Information  Thank you for taking time to visit with me today. Please don't hesitate to contact me if I can be of assistance to you before our next scheduled appointment.   Closing From: Complex Care Management.  Please call the care guide team at [phone] if you need to cancel, schedule, or reschedule an appointment.   Please call the Suicide and Crisis Lifeline: 988 go to [facility]gent [facility] [address], [place] [phone]) call 911 if you are experiencing a Mental Health or Behavioral Health Crisis or need someone to talk to.  Rolin Kerns, LCSW [place]  [facility], [facility] Clinical Social Worker Direct Dial: [phone]  Fax: [phone] Website: [email or web address] 3:20 PM

## 2023-12-16 NOTE — Patient Outreach (Signed)
 Complex Care Management   Visit Note  12/16/2023  Name:  Theresa Kirk MRN: [id number] DOB: 1960/09/17  Situation: Referral received for Complex Care Management related to Mental/Behavioral Health diagnosis Depression I obtained verbal consent from Patient.  Visit completed with pt  on the phone  Background:   Past Medical History:  Diagnosis Date   Abnormal mammogram with microcalcification 10/15/2013   Acquired hallux limitus of both feet 10/15/2019   Acute pain of right wrist 02/03/2018   Adverse effect of drug 08/03/2023   Alcohol abuse    Allergic reaction 08/03/2023   Arthritis    Asthma    Callus of foot 06/07/2008   Qualifier: Diagnosis of  By: Loletta MD, Vijay     Callus of foot 06/07/2008   Carpal tunnel syndrome    Cervical lymphadenopathy 09/21/2019   Cervical muscle pain 05/05/2014   - Pt with cervical pain radiating down her neck with no signs of radiculopathy or weakness in upper extremities   Depression    Fibromyalgia    Foot pain, left 08/12/2022   Healthcare maintenance 04/13/2012   Healthcare maintenance 04/13/2012   - Last Pap/ HPV testing in 2023      High cholesterol    Irritation symptom of skin 12/30/2017   Screening mammogram for breast cancer 08/12/2022    Assessment: Patient Reported Symptoms:  Cognitive Cognitive Status: Alert and oriented to person, place, and time Cognitive/Intellectual Conditions Management [RPT]: None reported or documented in medical history or problem list      Neurological Neurological Review of Symptoms: Not assessed    HEENT HEENT Symptoms Reported: Not assessed      Cardiovascular Cardiovascular Symptoms Reported: Not assessed    Respiratory Respiratory Symptoms Reported: Not assesed    Endocrine Endocrine Symptoms Reported: Not assessed    Gastrointestinal Gastrointestinal Symptoms Reported: Not assessed      Genitourinary Genitourinary Symptoms Reported: Not assessed    Integumentary  Integumentary Symptoms Reported: Not assessed    Musculoskeletal Musculoskelatal Symptoms Reviewed: Not assessed        Psychosocial Psychosocial Symptoms Reported: No symptoms reported Behavioral Management Strategies: Coping strategies          09/23/2023    8:16 AM  Depression screen PHQ 2/9  Decreased Interest 1  Down, Depressed, Hopeless 1  PHQ - 2 Score 2  Altered sleeping 3  Tired, decreased energy 2  Change in appetite 1  Feeling bad or failure about yourself  3  Trouble concentrating 3  Moving slowly or fidgety/restless 0  Suicidal thoughts 0  PHQ-9 Score 14  Difficult doing work/chores Somewhat difficult    There were no vitals filed for this visit.  Medications Reviewed Today     Reviewed by Ezzard Rolin BIRCH, LCSW (Social Worker) on 12/16/23 at 1519  Med List Status: <None>   Medication Order Taking? Sig Documenting Provider Last Dose Status Informant  albuterol  (VENTOLIN  HFA) 108 (90 Base) MCG/ACT inhaler [id number] No Inhale 1-2 puffs into the lungs every 6 (six) hours as needed for wheezing or shortness of breath. Lovie Clarity, MD Taking Active   amoxicillin -clavulanate (AUGMENTIN ) 875-125 MG tablet [id number]  Take 1 tablet by mouth every 12 (twelve) hours. Dreama, [place]  N, FNP  Active   benzonatate  (TESSALON ) 200 MG capsule [id number]  Take 1 capsule (200 mg total) by mouth every 8 (eight) hours. Dreama, [place]  N, FNP  Active   clotrimazole  (LOTRIMIN ) 1 % cream [id number] No Apply 1 Application topically 2 (two) times daily. Lovie Clarity, MD  Taking Active   diclofenac  Sodium (VOLTAREN ) 1 % GEL [id number] No Apply 4 g topically 4 (four) times daily. Lovie Clarity, MD Taking Active   pantoprazole  (PROTONIX ) 40 MG tablet [id number] No TAKE 1 TABLET BY MOUTH EVERY DAY(TAKE 30 MINS BEFORE MEALS) Katsadouros, Vasilios, MD Taking Active   rosuvastatin  (CRESTOR ) 10 MG tablet [id number] No Take 1 tablet (10 mg total) by mouth daily. Karna Fellows, MD Taking Active    triamcinolone  cream (KENALOG ) 0.1 % [id number]  Apply 1 Application topically 2 (two) times daily. Dreama, [place]  N, FNP  Active             Recommendation:   Continue Current Plan of Care  Follow Up Plan:   Closing From:  Complex Care Management  Rolin Kerns, LCSW [place]  [facility], [facility] Clinical Social Worker Direct Dial: [phone]  Fax: [phone] Website: [email or web address] 3:20 PM

## 2023-12-22 ENCOUNTER — Ambulatory Visit (INDEPENDENT_AMBULATORY_CARE_PROVIDER_SITE_OTHER)

## 2023-12-22 VITALS — BP 112/73 | HR 78 | Ht 65.0 in | Wt 201.2 lb

## 2023-12-22 DIAGNOSIS — R053 Chronic cough: Secondary | ICD-10-CM | POA: Diagnosis not present

## 2023-12-22 DIAGNOSIS — L509 Urticaria, unspecified: Secondary | ICD-10-CM

## 2023-12-22 MED ORDER — PANTOPRAZOLE SODIUM 40 MG PO TBEC: DELAYED_RELEASE_TABLET | ORAL | 1 refills | Status: AC

## 2023-12-22 MED ORDER — ALBUTEROL SULFATE HFA 108 (90 BASE) MCG/ACT IN AERS: 1.0000 | INHALATION_SPRAY | Freq: Four times a day (QID) | RESPIRATORY_TRACT | 0 refills | Status: DC | PRN

## 2023-12-22 MED ORDER — TRIAMCINOLONE ACETONIDE 0.1 % EX OINT: 1.0000 | TOPICAL_OINTMENT | Freq: Two times a day (BID) | CUTANEOUS | 0 refills | Status: DC

## 2023-12-22 NOTE — Patient Instructions (Addendum)
 Thank you, [name] for allowing us  to provide your care today. Today we discussed the following:  - For your cough, we have refilled your protonix  (treatment for heartburn which can cause cough). You can also try FloNase  which is a medication for over the counter. We can refill your albuterol  inhaler to take when you are feeling short of breath. - Please try to stop smoking as it may be contributing to your cough. - Try taking the triamcinolone  ointment twice a day for a week then stop.  I have ordered the following labs for you:  Lab Orders  No laboratory test(s) ordered today     Tests ordered today:  Pulmonary Function Testing  Referrals ordered today:   Referral Orders  No referral(s) requested today     I have ordered the following medication/changed the following medications:   Stop the following medications: Medications Discontinued During This Encounter  Medication Reason   triamcinolone  cream (KENALOG ) 0.1 % Discontinued by provider   pantoprazole  (PROTONIX ) 40 MG tablet Reorder   albuterol  (VENTOLIN  HFA) 108 (90 Base) MCG/ACT inhaler Reorder     Start the following medications: Meds ordered this encounter  Medications   pantoprazole  (PROTONIX ) 40 MG tablet    Sig: TAKE 1 TABLET BY MOUTH EVERY DAY(TAKE 30 MINS BEFORE MEALS)    Dispense:  90 tablet    Refill:  1   albuterol  (VENTOLIN  HFA) 108 (90 Base) MCG/ACT inhaler    Sig: Inhale 1-2 puffs into the lungs every 6 (six) hours as needed for wheezing or shortness of breath.    Dispense:  18 g    Refill:  0   triamcinolone  ointment (KENALOG ) 0.1 %    Sig: Apply 1 Application topically 2 (two) times daily.    Dispense:  30 g    Refill:  0     Follow up: prn    IF YOU EXPERIENCE CHEST PAIN, SHORTNESS OF BREATH, OR START COUGHING UP BLOOD PLEASE GO TO THE EMERGENCY DEPARTMENT.  Should you have any questions or concerns please call the Internal Medicine Clinic at [phone].     Theresa Cihlar,  MD [facility]

## 2023-12-22 NOTE — Progress Notes (Signed)
 Patient name: Theresa Kirk Date of birth: Sep 26, 1960 Date of visit: 12/22/23  Type of visit: Acute Office Visit  Subjective   Chief concern:  Chief Complaint  Patient presents with   Cough    Seen by Urgent Care  Still coughing   Wants to change pharmacy to [facility] on [address]     Theresa Kirk is a 63 y.o. female with a PMHx of prediabetes, MDD, chronic cough, restless leg syndrome, HLD, obesity who presents to [facility] clinic for follow up from urgent care for cough.    Patient Active Problem List   Diagnosis Date Noted   Urticarial rash 12/22/2023   Schatzki's ring 09/26/2023   Hiatal hernia 09/26/2023   Tubular adenoma of colon 09/26/2023   Obesity (BMI 30.0-34.9) 09/23/2023   Moderate recurrent major depression (HCC) 07/14/2023   Tinea pedis of left foot 07/14/2023   Cataract, nuclear sclerotic, both eyes 03/04/2023   Restless leg syndrome 08/12/2022   Peripheral polyneuropathy 08/12/2022   Leg cramping 09/18/2021   Sensorineural hearing loss (SNHL) of both ears 01/25/2020   Tinnitus 09/21/2019   Hot flashes 04/20/2019   Numerous moles 04/20/2019   Back pain 07/29/2017   Feeling of incomplete bladder emptying 02/27/2017   Prediabetes 09/29/2014   Bilateral ocular hypertension 06/27/2014   Cervical pain (neck) 05/05/2014   Chronic cough 12/09/2012   Seasonal allergies 09/23/2012   Arthritis of right knee 03/26/2012   Carpal tunnel syndrome 06/13/2010   Hyperlipidemia 11/06/2006     Past Surgical History:  Procedure Laterality Date   ABDOMINAL HYSTERECTOMY  2008   h/o uterine fibroids, ?partial hysterectomy per pt 04/13/12   CARPAL TUNNEL RELEASE Right 01/05/2021   Procedure: RIGHT CARPAL TUNNEL RELEASE;  Surgeon: Murrell Drivers, MD;  Location: [facility];  Service: Orthopedics;  Laterality: Right;  30 MIN   COLONOSCOPY     FOOT SURGERY Right    KNEE SURGERY      Review of Systems  Constitutional:  Negative for chills, fever  and malaise/fatigue.  HENT:  Negative for congestion, sinus pain and sore throat.   Respiratory:  Positive for cough and sputum production. Negative for shortness of breath.        + for DOE    Current Outpatient Medications  Medication Instructions   albuterol  (VENTOLIN  HFA) 108 (90 Base) MCG/ACT inhaler 1-2 puffs, Inhalation, Every 6 hours PRN   amoxicillin -clavulanate (AUGMENTIN ) 875-125 MG tablet 1 tablet, Oral, Every 12 hours   benzonatate  (TESSALON ) 200 mg, Oral, Every 8 hours   clotrimazole  (LOTRIMIN ) 1 % cream 1 Application, Topical, 2 times daily   diclofenac  Sodium (VOLTAREN ) 4 g, Topical, 4 times daily   pantoprazole  (PROTONIX ) 40 MG tablet TAKE 1 TABLET BY MOUTH EVERY DAY(TAKE 30 MINS BEFORE MEALS)   rosuvastatin  (CRESTOR ) 10 mg, Oral, Daily   triamcinolone  ointment (KENALOG ) 0.1 % 1 Application, Topical, 2 times daily    Social History   Tobacco Use   Smoking status: Former    Current packs/day: 0.00    Types: Cigarettes    Quit date: 06/03/2000    Years since quitting: 23.5   Smokeless tobacco: Never  Vaping Use   Vaping status: Never Used  Substance Use Topics   Alcohol use: Yes    Comment: Sometimes.   Drug use: Yes    Types: Marijuana      Objective  Today's Vitals   12/22/23 1549  BP: 112/73  Pulse: 78  Weight: 201 lb 3.2 oz (91.3 kg)  Height:  5' 5 (1.651 m)  Body mass index is 33.48 kg/m.   Physical Exam: Constitutional: well-appearing, well-nourished; obese; no acute distress HENT: normocephalic atraumatic, mucous membranes moist Eyes: conjunctiva non-erythematous Cardiovascular: regular rate and rhythm, no m/r/g Pulmonary/Chest: normal work of breathing on room air, coughing during exam, rhonchi heard in lower lung fields upon expiration. No wheezing or stridor heard. Abdominal: soft, non-tender, non-distended MSK: normal bulk and tone Neurological: alert & oriented x 3, no focal deficit Skin: warm and dry Extremities: BLE without edema or  erythema. Psych: normal mood and behavior  Last CBC Lab Results  Component Value Date   WBC 8.5 07/14/2023   HGB 12.7 07/14/2023   HCT 38.8 07/14/2023   MCV 91 07/14/2023   MCH 29.7 07/14/2023   RDW 13.1 07/14/2023   PLT 317 07/14/2023   Last metabolic panel Lab Results  Component Value Date   GLUCOSE 126 (H) 07/03/2023   NA 138 07/03/2023   K 4.0 07/03/2023   CL 105 07/03/2023   CO2 25 07/03/2023   BUN 19 07/03/2023   CREATININE 0.95 07/03/2023   GFRNONAA >60 07/03/2023   CALCIUM  9.1 07/03/2023   PHOS 3.8 07/20/2014   PROT 7.6 07/03/2023   ALBUMIN 4.1 07/03/2023   LABGLOB 2.3 09/18/2021   AGRATIO 2.2 09/18/2021   BILITOT 0.4 07/03/2023   ALKPHOS 88 07/03/2023   AST 20 07/03/2023   ALT 24 07/03/2023   ANIONGAP 8 07/03/2023        Assessment & Plan  Chronic cough Assessment & Plan: Patient is presenting with cough with sputum production for a few months.  Was seen at urgent care on 11/20/2023 and diagnosed with acute bronchitis.  At the time she was experiencing sore throat, nasal congestion, and rhinorrhea.  CXR at the time showed bronchitis versus reactive airway disease.  She was prescribed 7-day course of Augmentin , Tessalon  Perles, and prednisone  20 mg for 5 days.  It appears that she had a viral URI that exacerbated her symptoms.  With regards to her current illness, the patient states she is also having some heartburn symptoms but has been out of her Protonix .  She is experiencing dyspnea on exertion, but denies shortness of breath at baseline, chest pain, fever, hemoptysis.  No concern for acute infectious process.  Patient is afebrile with normal oxygen saturation on room air.  Due to patient's history of childhood asthma and borderline PFTs in 2018, I think it is appropriate to repeat testing.  Patient is also smoking marijuana every day which may contribute to her cough.  Plan:  - Refilled Protonix  40 mg - Ordered PFTs - Refilled albuterol  inhaler as needed -  Flonase  OTC as needed with runny nose -Counseled patient on smoking cessation  Orders: -     Pantoprazole  Sodium; TAKE 1 TABLET BY MOUTH EVERY DAY(TAKE 30 MINS BEFORE MEALS)  Dispense: 90 tablet; Refill: 1 -     Albuterol  Sulfate HFA; Inhale 1-2 puffs into the lungs every 6 (six) hours as needed for wheezing or shortness of breath.  Dispense: 18 g; Refill: 0 -     Pulmonary Function Test  Urticarial rash Assessment & Plan: Patient has a history of tinea pedis infection of her left foot.  This has resolved.  Last month, the patient states that she stepped on an ant hill with her left foot and was bitten several times.  She was seen in urgent care and prescribed triamcinolone  0.1% cream that has helped alleviate symptoms of itching.  She is  now stating that her foot is itchy again.  On exam the patient has several hyperpigmented lesions overlying the digits of the foot.  There are no signs of tinea infection between toes. -Start triamcinolone  ointment 0.1% 2 times daily for 1 week  Orders: -     Triamcinolone  Acetonide; Apply 1 Application topically 2 (two) times daily.  Dispense: 30 g; Refill: 0    Return if symptoms worsen or fail to improve, for cough.  Patient already has follow-up appointment scheduled with Dr. Karna.  Patient discussed with Dr. Karna, who also saw and evaluated the patient.  Lannis Lichtenwalner, MD [facility]  PGY-1 12/22/2023, 4:56 PM

## 2023-12-22 NOTE — Assessment & Plan Note (Addendum)
 Patient has a history of tinea pedis infection of her left foot.  This has resolved.  Last month, the patient states that she stepped on an ant hill with her left foot and was bitten several times.  She was seen in urgent care and prescribed triamcinolone  0.1% cream that has helped alleviate symptoms of itching.  She is now stating that her foot is itchy again.  On exam the patient has several hyperpigmented lesions overlying the digits of the foot.  There are no signs of tinea infection between toes. -Start triamcinolone  ointment 0.1% 2 times daily for 1 week

## 2023-12-22 NOTE — Assessment & Plan Note (Signed)
 Patient is presenting with cough with sputum production for a few months.  Was seen at urgent care on 11/20/2023 and diagnosed with acute bronchitis.  At the time she was experiencing sore throat, nasal congestion, and rhinorrhea.  CXR at the time showed bronchitis versus reactive airway disease.  She was prescribed 7-day course of Augmentin , Tessalon  Perles, and prednisone  20 mg for 5 days.  It appears that she had a viral URI that exacerbated her symptoms.  With regards to her current illness, the patient states she is also having some heartburn symptoms but has been out of her Protonix .  She is experiencing dyspnea on exertion, but denies shortness of breath at baseline, chest pain, fever, hemoptysis.  No concern for acute infectious process.  Patient is afebrile with normal oxygen saturation on room air.  Due to patient's history of childhood asthma and borderline PFTs in 2018, I think it is appropriate to repeat testing.  Patient is also smoking marijuana every day which may contribute to her cough.  Plan:  - Refilled Protonix  40 mg - Ordered PFTs - Refilled albuterol  inhaler as needed - Flonase  OTC as needed with runny nose -Counseled patient on smoking cessation

## 2023-12-24 ENCOUNTER — Ambulatory Visit: Payer: 59

## 2023-12-24 VITALS — Ht 64.5 in | Wt 201.0 lb

## 2023-12-24 DIAGNOSIS — Z0000 Encounter for general adult medical examination without abnormal findings: Secondary | ICD-10-CM | POA: Diagnosis not present

## 2023-12-24 NOTE — Progress Notes (Signed)
 Because this visit was a virtual/telehealth visit,  certain criteria was not obtained, such a blood pressure, CBG if applicable, and timed get up and go. Any medications not marked as taking were not mentioned during the medication reconciliation part of the visit. Any vitals not documented were not able to be obtained due to this being a telehealth visit or patient was unable to self-report a recent blood pressure reading due to a lack of equipment at home via telehealth. Vitals that have been documented are verbally provided by the patient.   Subjective:   Theresa Kirk is a 63 y.o. who presents for a Medicare Wellness preventive visit.  As a reminder, Annual Wellness Visits don't include a physical exam, and some assessments may be limited, especially if this visit is performed virtually. We may recommend an in-person follow-up visit with your provider if needed.  Visit Complete: Virtual I connected with  Theresa Kirk on 12/24/23 by a audio enabled telemedicine application and verified that I am speaking with the correct person using two identifiers.  Patient Location: Home  Provider Location: Home Office  I discussed the limitations of evaluation and management by telemedicine. The patient expressed understanding and agreed to proceed.  Vital Signs: Because this visit was a virtual/telehealth visit, some criteria may be missing or patient reported. Any vitals not documented were not able to be obtained and vitals that have been documented are patient reported.  VideoDeclined- This patient declined Librarian, academic. Therefore the visit was completed with audio only.  Persons Participating in Visit: Patient.  AWV Questionnaire: No: Patient Medicare AWV questionnaire was not completed prior to this visit.  Cardiac Risk Factors include: advanced age (>9men, >24 women);sedentary lifestyle;dyslipidemia;family history of premature cardiovascular  disease;obesity (BMI >30kg/m2)     Objective:    Today's Vitals   12/24/23 1113  Weight: 201 lb (91.2 kg)  Height: 5' 4.5 (1.638 m)  PainSc: 0-No pain   Body mass index is 33.97 kg/m.     12/24/2023   11:16 AM 10/07/2023    8:30 AM 09/23/2023    8:16 AM 07/28/2023    2:09 AM 07/03/2023    9:13 PM 03/04/2023    8:35 AM 12/18/2022   10:40 AM  Advanced Directives  Does Patient Have a Medical Advance Directive? No No No No No No No  Would patient like information on creating a medical advance directive? No - Patient declined No - Patient declined No - Patient declined No - Patient declined No - Patient declined No - Patient declined No - Patient declined    Current Medications (verified) Outpatient Encounter Medications as of 12/24/2023  Medication Sig   pantoprazole  (PROTONIX ) 40 MG tablet TAKE 1 TABLET BY MOUTH EVERY DAY(TAKE 30 MINS BEFORE MEALS)   albuterol  (VENTOLIN  HFA) 108 (90 Base) MCG/ACT inhaler Inhale 1-2 puffs into the lungs every 6 (six) hours as needed for wheezing or shortness of breath. (Patient not taking: Reported on 12/24/2023)   amoxicillin -clavulanate (AUGMENTIN ) 875-125 MG tablet Take 1 tablet by mouth every 12 (twelve) hours. (Patient not taking: Reported on 12/24/2023)   benzonatate  (TESSALON ) 200 MG capsule Take 1 capsule (200 mg total) by mouth every 8 (eight) hours. (Patient not taking: Reported on 12/24/2023)   clotrimazole  (LOTRIMIN ) 1 % cream Apply 1 Application topically 2 (two) times daily. (Patient not taking: Reported on 12/24/2023)   diclofenac  Sodium (VOLTAREN ) 1 % GEL Apply 4 g topically 4 (four) times daily. (Patient not taking: Reported on 12/24/2023)  rosuvastatin  (CRESTOR ) 10 MG tablet Take 1 tablet (10 mg total) by mouth daily. (Patient not taking: Reported on 12/24/2023)   triamcinolone  ointment (KENALOG ) 0.1 % Apply 1 Application topically 2 (two) times daily. (Patient not taking: Reported on 12/24/2023)   No facility-administered encounter  medications on file as of 12/24/2023.    Allergies (verified) Iohexol , Ibuprofen , and Cephalexin    History: Past Medical History:  Diagnosis Date   Abnormal mammogram with microcalcification 10/15/2013   Acquired hallux limitus of both feet 10/15/2019   Acute pain of right wrist 02/03/2018   Adverse effect of drug 08/03/2023   Alcohol abuse    Allergic reaction 08/03/2023   Arthritis    Asthma    Callus of foot 06/07/2008   Qualifier: Diagnosis of  By: Loletta MD, Vijay     Callus of foot 06/07/2008   Carpal tunnel syndrome    Cervical lymphadenopathy 09/21/2019   Cervical muscle pain 05/05/2014   - Pt with cervical pain radiating down her neck with no signs of radiculopathy or weakness in upper extremities   Depression    Fibromyalgia    Foot pain, left 08/12/2022   Healthcare maintenance 04/13/2012   Healthcare maintenance 04/13/2012   - Last Pap/ HPV testing in 2023      High cholesterol    Irritation symptom of skin 12/30/2017   Screening mammogram for breast cancer 08/12/2022   Past Surgical History:  Procedure Laterality Date   ABDOMINAL HYSTERECTOMY  2008   h/o uterine fibroids, ?partial hysterectomy per pt 04/13/12   CARPAL TUNNEL RELEASE Right 01/05/2021   Procedure: RIGHT CARPAL TUNNEL RELEASE;  Surgeon: Murrell Drivers, MD;  Location: [facility];  Service: Orthopedics;  Laterality: Right;  30 MIN   COLONOSCOPY     FOOT SURGERY Right    KNEE SURGERY     Family History  Problem Relation Age of Onset   Diabetes Mother    Hypertension Mother    Stroke Mother    Diabetes Sister    Thyroid  disease Sister    Hypertension Sister    Cancer Maternal Aunt        unknown   Breast cancer Maternal Aunt    Social History   Socioeconomic History   Marital status: Single    Spouse name: Not on file   Number of children: Not on file   Years of education: Not on file   Highest education level: Not on file  Occupational History   Not on file  Tobacco  Use   Smoking status: Former    Current packs/day: 0.00    Types: Cigarettes    Quit date: 06/03/2000    Years since quitting: 23.5   Smokeless tobacco: Never  Vaping Use   Vaping status: Never Used  Substance and Sexual Activity   Alcohol use: Yes    Comment: Sometimes.   Drug use: Yes    Types: Marijuana   Sexual activity: Not on file  Other Topics Concern   Not on file  Social History Narrative   Lives with mother in [place].   Currently unemployed.   Used to work at [employer] in [place].   ETOH use: weekend, 1-2 beers from [employer]: used to drink everyday   Former smoker   Current THC use   H/o cocaine use: quit in 2001.   Social Drivers of Health   Financial Resource Strain: Low Risk  (12/24/2023)   Overall Financial Resource Strain (CARDIA)    Difficulty of Paying Living Expenses:  Not very hard  Recent Concern: Financial Resource Strain - High Risk (10/22/2023)   Overall Financial Resource Strain (CARDIA)    Difficulty of Paying Living Expenses: Very hard  Food Insecurity: No Food Insecurity (12/24/2023)   Hunger Vital Sign    Worried About Running Out of Food in the Last Year: Never true    Ran Out of Food in the Last Year: Never true  Transportation Needs: No Transportation Needs (12/24/2023)   PRAPARE - Administrator, Civil Service (Medical): No    Lack of Transportation (Non-Medical): No  Physical Activity: Patient Declined (12/24/2023)   Exercise Vital Sign    Days of Exercise per Week: Patient declined    Minutes of Exercise per Session: Patient declined  Stress: No Stress Concern Present (12/24/2023)   [employer] of Occupational Health - Occupational Stress Questionnaire    Feeling of Stress: Not at all  Social Connections: Unknown (12/24/2023)   Social Connection and Isolation Panel    Frequency of Communication with Friends and Family: More than three times a week    Frequency of Social Gatherings with Friends and Family: Twice a week     Attends Religious Services: More than 4 times per year    Active Member of [employer] or Organizations: Yes    Attends Banker Meetings: Never    Marital Status: Patient declined    Tobacco Counseling Counseling given: Not Answered    Clinical Intake:  Pre-visit preparation completed: Yes  Pain : No/denies pain Pain Score: 0-No pain     BMI - recorded: 33.97 Nutritional Status: BMI > 30  Obese Nutritional Risks: None Diabetes: No  Lab Results  Component Value Date   HGBA1C 6.3 (A) 07/14/2023   HGBA1C 6.3 (A) 03/04/2023   HGBA1C 6.5 (H) 08/12/2022     How often do you need to have someone help you when you read instructions, pamphlets, or other written materials from your doctor or pharmacy?: 1 - Never  Interpreter Needed?: No  Information entered by :: Leeman Johnsey N. Aubriegh Minch, LPN.   Activities of Daily Living     12/24/2023   11:16 AM 03/04/2023    8:34 AM  In your present state of health, do you have any difficulty performing the following activities:  Hearing? 0 0  Vision? 0 0  Difficulty concentrating or making decisions? 1 1  Comment with concentration   Walking or climbing stairs? 1 1  Dressing or bathing? 0 0  Doing errands, shopping? 0 0  Preparing Food and eating ? N   Using the Toilet? N   In the past six months, have you accidently leaked urine? N   Do you have problems with loss of bowel control? N   Managing your Medications? N   Managing your Finances? N   Housekeeping or managing your Housekeeping? N     Patient Care Team: Karna Fellows, MD as PCP - General (Internal Medicine)  I have updated your Care Teams any recent Medical Services you may have received from other providers in the past year.     Assessment:   This is a routine wellness examination for Theresa Kirk.  Hearing/Vision screen Hearing Screening - Comments:: Denies hearing difficulties.  Vision Screening - Comments:: Wears otc readers - up to date with routine eye exams  with Donnice Mems, MD.    Goals Addressed             This Visit's Progress    Patient Stated: No goals  at this time.         Depression Screen     12/24/2023   11:17 AM 09/23/2023    8:16 AM 07/29/2023    9:36 AM 07/14/2023    8:33 AM 05/15/2023   10:40 AM 03/11/2023    9:40 AM 03/04/2023    9:18 AM  PHQ 2/9 Scores  PHQ - 2 Score 0 2 0 0 2 5 5   PHQ- 9 Score 1 14   6 19 19     Fall Risk     12/24/2023   11:16 AM 12/22/2023    3:49 PM 07/29/2023    9:36 AM 07/14/2023    8:33 AM 03/04/2023    8:34 AM  Fall Risk   Falls in the past year? 0 0 0 0 0  Number falls in past yr: 0 0 0 0 0  Injury with Fall? 0 0 0 0 0  Risk for fall due to : No Fall Risks No Fall Risks;Impaired mobility No Fall Risks No Fall Risks No Fall Risks  Follow up Falls evaluation completed Falls evaluation completed Falls evaluation completed Falls evaluation completed;Falls prevention discussed Falls evaluation completed;Falls prevention discussed    MEDICARE RISK AT HOME:  Medicare Risk at Home Any stairs in or around the home?: No If so, are there any without handrails?: No Home free of loose throw rugs in walkways, pet beds, electrical cords, etc?: Yes Adequate lighting in your home to reduce risk of falls?: Yes Life alert?: No Use of a cane, walker or w/c?: Yes Grab bars in the bathroom?: No Shower chair or bench in shower?: No Elevated toilet seat or a handicapped toilet?: No  TIMED UP AND GO:  Was the test performed?  No  Cognitive Function: Declined/Normal: No cognitive concerns noted by patient or family. Patient alert, oriented, able to answer questions appropriately and recall recent events. No signs of memory loss or confusion.    12/24/2023   11:24 AM  MMSE - Mini Mental State Exam  Not completed: Unable to complete        12/24/2023   11:19 AM 12/18/2022   10:41 AM 08/24/2021    3:03 PM  6CIT Screen  What Year? 0 points 0 points 0 points  What month? 0 points 0 points 0  points  What time? 0 points 0 points 0 points  Count back from 20 0 points 0 points 0 points  Months in reverse 0 points 2 points 0 points  Repeat phrase 0 points 8 points 0 points  Total Score 0 points 10 points 0 points    Immunizations Immunization History  Administered Date(s) Administered   Influenza Split 03/07/2011, 04/13/2012   Influenza Whole 04/02/2007, 03/16/2008, 01/30/2010   Influenza, Seasonal, Injecte, Preservative Fre 03/04/2023   Influenza,inj,Quad PF,6+ Mos 05/05/2014, 03/03/2015, 05/21/2016, 02/27/2017, 02/03/2018, 02/16/2019   Influenza-Unspecified 06/10/2022   PFIZER(Purple Top)SARS-COV-2 Vaccination 09/02/2019, 09/28/2019   Pfizer(Comirnaty)Fall Seasonal Vaccine 12 years and older 06/10/2022   Td 04/30/2010   Tdap 05/15/2021    Screening Tests Health Maintenance  Topic Date Due   Zoster Vaccines- Shingrix (1 of 2) Never done   COVID-19 Vaccine (4 - 2024-25 season) 02/02/2023   INFLUENZA VACCINE  01/02/2024   Medicare Annual Wellness (AWV)  12/23/2024   MAMMOGRAM  09/28/2025   Cervical Cancer Screening (HPV/Pap Cotest)  09/19/2026   Colonoscopy  06/10/2028   DTaP/Tdap/Td (3 - Td or Tdap) 05/16/2031   Hepatitis C Screening  Completed   HIV Screening  Completed  Hepatitis B Vaccines  Aged Out   HPV VACCINES  Aged Out   Meningococcal B Vaccine  Aged Out    Health Maintenance  Health Maintenance Due  Topic Date Due   Zoster Vaccines- Shingrix (1 of 2) Never done   COVID-19 Vaccine (4 - 2024-25 season) 02/02/2023   Health Maintenance Items Addressed: Yes Patient aware of current care gaps.  Immunization record was verified by [facility] and updated in patient's chart.  Additional Screening:  Vision Screening: Recommended annual ophthalmology exams for early detection of glaucoma and other disorders of the eye. Would you like a referral to an eye doctor? No    Dental Screening: Recommended annual dental exams for proper oral hygiene  Community  Resource Referral / Chronic Care Management: CRR required this visit?  No   CCM required this visit?  No   Plan:    I have personally reviewed and noted the following in the patient's chart:   Medical and social history Use of alcohol, tobacco or illicit drugs  Current medications and supplements including opioid prescriptions. Patient is not currently taking opioid prescriptions. Functional ability and status Nutritional status Physical activity Advanced directives List of other physicians Hospitalizations, surgeries, and ER visits in previous 12 months Vitals Screenings to include cognitive, depression, and falls Referrals and appointments  In addition, I have reviewed and discussed with patient certain preventive protocols, quality metrics, and best practice recommendations. A written personalized care plan for preventive services as well as general preventive health recommendations were provided to patient.   Theresa LOISE Fuller, LPN   [id number]   After Visit Summary: (Declined) Due to this being a telephonic visit, with patients personalized plan was offered to patient but patient Declined AVS at this time   Notes: Patient aware of current care gaps.  Immunization record was verified by [facility] and updated in patient's chart. Patient is due for Shingrix and Covid vaccines.

## 2023-12-24 NOTE — Patient Instructions (Addendum)
 [name] , Thank you for taking time out of your busy schedule to complete your Annual Wellness Visit with me. I enjoyed our conversation and look forward to speaking with you again next year. I, as well as your care team,  appreciate your ongoing commitment to your health goals. Please review the following plan we discussed and let me know if I can assist you in the future. Your Game plan/ To Do List    Referrals: If you haven't heard from the office you've been referred to, please reach out to them at the phone provided.   Follow up Visits: Next Medicare AWV with our clinical staff: 12/29/2024 at 11:10 a.m. phone visit with [facility]   Have you seen your provider in the last 6 months (3 months if uncontrolled diabetes)? Yes Next Office Visit with your provider: 01/20/2024 at 8:15 a.m. office visit with Dr. Karna  Clinician Recommendations:  Aim for 30 minutes of exercise or brisk walking, 6-8 glasses of water, and 5 servings of fruits and vegetables each day.       This is a list of the screening recommended for you and due dates:  Health Maintenance  Topic Date Due   Zoster (Shingles) Vaccine (1 of 2) Never done   COVID-19 Vaccine (4 - 2024-25 season) 02/02/2023   Flu Shot  01/02/2024   Medicare Annual Wellness Visit  12/23/2024   Mammogram  09/28/2025   Pap with HPV screening  09/19/2026   Colon Cancer Screening  06/10/2028   DTaP/Tdap/Td vaccine (3 - Td or Tdap) 05/16/2031   Hepatitis C Screening  Completed   HIV Screening  Completed   Hepatitis B Vaccine  Aged Out   HPV Vaccine  Aged Out   Meningitis B Vaccine  Aged Out    Advanced directives: (Declined) Advance directive discussed with you today. Even though you declined this today, please call our office should you change your mind, and we can give you the proper paperwork for you to fill out. Advance Care Planning is important because it:  [x]  Makes sure you receive the medical care that is consistent with your values, goals, and  preferences  [x]  It provides guidance to your family and loved ones and reduces their decisional burden about whether or not they are making the right decisions based on your wishes.  Follow the link provided in your after visit summary or read over the paperwork we have mailed to you to help you started getting your Advance Directives in place. If you need assistance in completing these, please reach out to us  so that we can help you!  See attachments for Preventive Care and Fall Prevention Tips.

## 2023-12-26 NOTE — Progress Notes (Signed)
 Internal Medicine Clinic Attending  I was physically present during the key portions of the resident provided service and participated in the medical decision making of patient's management care. I reviewed pertinent patient test results.  The assessment, diagnosis, and plan were formulated together and I agree with the documentation in the resident's note.  Dickie La, MD

## 2024-01-20 ENCOUNTER — Ambulatory Visit: Admitting: Internal Medicine

## 2024-01-20 ENCOUNTER — Encounter: Payer: Self-pay | Admitting: Internal Medicine

## 2024-01-20 VITALS — BP 133/78 | HR 67 | Temp 98.3°F | Ht 65.0 in | Wt 201.2 lb

## 2024-01-20 DIAGNOSIS — R252 Cramp and spasm: Secondary | ICD-10-CM

## 2024-01-20 DIAGNOSIS — M5416 Radiculopathy, lumbar region: Secondary | ICD-10-CM

## 2024-01-20 DIAGNOSIS — R202 Paresthesia of skin: Secondary | ICD-10-CM

## 2024-01-20 DIAGNOSIS — E785 Hyperlipidemia, unspecified: Secondary | ICD-10-CM

## 2024-01-20 DIAGNOSIS — R053 Chronic cough: Secondary | ICD-10-CM | POA: Diagnosis not present

## 2024-01-20 DIAGNOSIS — R200 Anesthesia of skin: Secondary | ICD-10-CM

## 2024-01-20 NOTE — Assessment & Plan Note (Signed)
>>  ASSESSMENT AND PLAN FOR NUMBNESS AND TINGLING OF RIGHT LEG WRITTEN ON 01/20/2024 11:29 AM BY DANESHVAR, NINA, MEDICAL STUDENT  Assessment: Patient has noticed 3 weeks of increasingly persistent numbness and tingling starting at the hip and going down past the knee of the RLE. She notices it more when she has been sitting for extended periods of time. She comments that sometimes when she is sitting on the toilet for extended periods of time, she has numbness and tingling down the leg which can extend into the groin region. She has a positive straight leg raise of RLE and decreased strength of RLE with hip flexion today on physical exam. She denies fecal or bladder incontinence, but says that sometimes she feels that she does not go to the bathroom frequently enough. Most likely etiology could be lumbar radiculopathy, and would like to get imaging to visualize extent of impingement. Cauda equine less likely due to intermittence of symptoms and no urinary or fecal incontinence.  -referral to PT - MRI - will follow up in 3 months

## 2024-01-20 NOTE — Assessment & Plan Note (Addendum)
 Assessment: Patient has had persistence of a cough for the past 3 months that she now describes as productive of yellow phlegm. She has no associated hemoptysis. She notices the cough more in the morning and also shortly after meals. Of note, she sometimes reports food feeling stuck and associates this with symptoms of acid reflux. She was prescribed albuterol , but has not noticed relief from this and felt that it was difficult for her to cough up phlegm after taking this. She still smokes marijuana daily but had tried to decrease daily time smoking by starting to smoke later in the day; she notices more cough and phlegm after smoking. Patient has not yet been contacted regarding PFTs. Etiology of symptoms seems consistent with chronic bronchitis 2/2 to chronic irritation such as smoking. Multifactorial effects of GERD and history of asthma could also be contributing to the cough, but PFTs would be essential in confirming formal diagnosis of chronic bronchitis.   Plan:  - complete PFTs - encourage taking PPI every day - continue to encourage smoking cessation

## 2024-01-20 NOTE — Assessment & Plan Note (Addendum)
 Assessment: Patient reports last time taking rosuvastatin  last week and has not been taking medication consistently. She does not want to take many pills at once and also is concerned that her rosuvastatin  has been associated with instances of nose bleeding that she has woken up with on occasions that she has taken it. She has had some instances of nose bleeding without taking rosuvastatin . Patient nose bleeding could be 2/2 to chronic airway inflammation from smoking or instances of hypertension, and it is difficult to assess whether this symptom is directly related to rosuvastatin . It is important for patient to be on a statin given elevated LDL in most recent lipid panel. She was advised to space out her medications so that her PPI is taken in the morning and her rosuvastatin  and night.  Plan:  - repeat lipid panel at next visit if she is taking it - encourage consistent adherence to rosuvastatin 

## 2024-01-20 NOTE — Assessment & Plan Note (Addendum)
 Assessment: Patient has noticed 3 weeks of increasingly persistent numbness and tingling starting at the hip and going down past the knee of the RLE. She notices it more when she has been sitting for extended periods of time. She comments that sometimes when she is sitting on the toilet for extended periods of time, she has numbness and tingling down the leg which can extend into the groin region. She has a positive straight leg raise of RLE and decreased strength of RLE with hip flexion today on physical exam. She denies fecal or bladder incontinence, but says that sometimes she feels that she does not go to the bathroom frequently enough. Most likely etiology could be lumbar radiculopathy, and would like to get imaging to visualize extent of impingement. Cauda equine less likely due to intermittence of symptoms and no urinary or fecal incontinence.  -referral to PT - MRI - will follow up in 3 months

## 2024-01-20 NOTE — Assessment & Plan Note (Addendum)
 Assessment: Patient comments on occasional leg cramping with use and abdominal cramping that wraps around her stomach in a band-like fashion. She describes the cramping as muscular in nature, and does not associate it with sensations of acid reflux. Patient reports drinking 2 bottles of water today. Most recent electrolyte levels 06/2023 WNL. Etiology of intermittent cramping likely 2/2 dehydration and exposure to increased hot temperatures in the summer.   Plan:  - encourage more water consumption.  - could consider repeat BMP in follow up in 3 months if symptoms persist

## 2024-01-20 NOTE — Progress Notes (Signed)
 This is a Psychologist, occupational Note.  The care of the patient was discussed with Dr. Karna and the assessment and plan was formulated with their assistance.  Please see their note for official documentation of the patient encounter.   Subjective:   Patient ID: Theresa Kirk female   DOB: 1960-10-21 63 y.o.   MRN: [id number]  HPI: Theresa Kirk is a 63 y.o. woman with PMH HLD, depression, arthritis, GERD, hiatal hernia, Schatzki's ring who presents for follow up today.   For the details of today's visit, please refer to the assessment and plan.     Past Medical History:  Diagnosis Date   Abnormal mammogram with microcalcification 10/15/2013   Acquired hallux limitus of both feet 10/15/2019   Acute pain of right wrist 02/03/2018   Adverse effect of drug 08/03/2023   Alcohol abuse    Allergic reaction 08/03/2023   Arthritis    Asthma    Callus of foot 06/07/2008   Qualifier: Diagnosis of  By: Loletta MD, Vijay     Callus of foot 06/07/2008   Carpal tunnel syndrome    Cervical lymphadenopathy 09/21/2019   Cervical muscle pain 05/05/2014   - Pt with cervical pain radiating down her neck with no signs of radiculopathy or weakness in upper extremities   Depression    Fibromyalgia    Foot pain, left 08/12/2022   Healthcare maintenance 04/13/2012   Healthcare maintenance 04/13/2012   - Last Pap/ HPV testing in 2023      High cholesterol    Irritation symptom of skin 12/30/2017   Screening mammogram for breast cancer 08/12/2022   Current Outpatient Medications  Medication Sig Dispense Refill   albuterol  (VENTOLIN  HFA) 108 (90 Base) MCG/ACT inhaler Inhale 1-2 puffs into the lungs every 6 (six) hours as needed for wheezing or shortness of breath. (Patient not taking: Reported on 12/24/2023) 18 g 0   diclofenac  Sodium (VOLTAREN ) 1 % GEL Apply 4 g topically 4 (four) times daily. (Patient not taking: Reported on 12/24/2023) 350 g 1   pantoprazole  (PROTONIX ) 40 MG tablet TAKE 1 TABLET BY  MOUTH EVERY DAY(TAKE 30 MINS BEFORE MEALS) 90 tablet 1   rosuvastatin  (CRESTOR ) 10 MG tablet Take 1 tablet (10 mg total) by mouth daily. (Patient not taking: Reported on 12/24/2023) 90 tablet 3   triamcinolone  ointment (KENALOG ) 0.1 % Apply 1 Application topically 2 (two) times daily. (Patient not taking: Reported on 12/24/2023) 30 g 0   No current facility-administered medications for this visit.    Review of Systems: Pertinent items are noted in HPI. Objective:  Physical Exam: Vitals:   01/20/24 0820  BP: 133/78  Pulse: 67  Temp: 98.3 F (36.8 C)  TempSrc: Oral  SpO2: 100%  Weight: 201 lb 3.2 oz (91.3 kg)  Height: 5' 5 (1.651 m)    Constitutional: NAD, appears comfortable Cardiovascular: RRR, no murmurs, rubs, or gallops.  Pulmonary/Chest: CTAB, no wheezes, rales, or rhonchi. No chest wall abnormalities. No increased work of breathing.  Extremities: Warm and well perfused. No edema. Positive straight leg raise for RLE. Negative straight leg raise for LLE.  MSK: 5/5 strength of LLE. 4/5 of RLE with hip flexion; 5/5 strength in RLE knee flexion, dorsiflexion, and plantar flexion.  Skin: No rashes or erythema on bilateral legs.  Psychiatric: Normal mood and affect  Assessment & Plan:   Numbness and tingling of right leg Assessment: Patient has noticed 3 weeks of increasingly persistent numbness and tingling starting at the hip and going  down past the knee of the RLE. She notices it more when she has been sitting for extended periods of time. She comments that sometimes when she is sitting on the toilet for extended periods of time, she has numbness and tingling down the leg which can extend into the groin region. She has a positive straight leg raise of RLE and decreased strength of RLE with hip flexion today on physical exam. She denies fecal or bladder incontinence, but says that sometimes she feels that she does not go to the bathroom frequently enough. Most likely etiology could  be lumbar radiculopathy, and would like to get imaging to visualize extent of impingement. Cauda equine less likely due to intermittence of symptoms and no urinary or fecal incontinence.  -referral to PT - MRI - will follow up in 3 months  Leg cramping Assessment: Patient comments on occasional leg cramping with use and abdominal cramping that wraps around her stomach in a band-like fashion. She describes the cramping as muscular in nature, and does not associate it with sensations of acid reflux. Patient reports drinking 2 bottles of water today. Most recent electrolyte levels 06/2023 WNL. Etiology of intermittent cramping likely 2/2 dehydration and exposure to increased hot temperatures in the summer.   Plan:  - encourage more water consumption.  - could consider repeat BMP in follow up in 3 months if symptoms persist   Chronic cough Assessment: Patient has had persistence of a cough for the past 3 months that she now describes as productive of yellow phlegm. She has no associated hemoptysis. She notices the cough more in the morning and also shortly after meals. Of note, she sometimes reports food feeling stuck and associates this with symptoms of acid reflux. She was prescribed albuterol , but has not noticed relief from this and felt that it was difficult for her to cough up phlegm after taking this. She still smokes marijuana daily but had tried to decrease daily time smoking by starting to smoke later in the day; she notices more cough and phlegm after smoking. Patient has not yet been contacted regarding PFTs. Etiology of symptoms seems consistent with chronic bronchitis 2/2 to chronic irritation such as smoking. Multifactorial effects of GERD and history of asthma could also be contributing to the cough, but PFTs would be essential in confirming formal diagnosis of chronic bronchitis.   Plan:  - complete PFTs - encourage taking PPI every day - continue to encourage smoking  cessation  Hyperlipidemia Assessment: Patient reports last time taking rosuvastatin  last week and has not been taking medication consistently. She does not want to take many pills at once and also is concerned that her rosuvastatin  has been associated with instances of nose bleeding that she has woken up with on occasions that she has taken it. She has had some instances of nose bleeding without taking rosuvastatin . Patient nose bleeding could be 2/2 to chronic airway inflammation from smoking or instances of hypertension, and it is difficult to assess whether this symptom is directly related to rosuvastatin . It is important for patient to be on a statin given elevated LDL in most recent lipid panel. She was advised to space out her medications so that her PPI is taken in the morning and her rosuvastatin  and night.  Plan:  - repeat lipid panel at next visit if she is taking it - encourage consistent adherence to rosuvastatin 

## 2024-01-20 NOTE — Progress Notes (Signed)
 Attestation for Student Documentation:  I personally was present and re-performed the history, physical exam and medical decision-making activities of this service and have verified that the service and findings are accurately documented in the student's note.  -Continues to have chronic cough with sputum production. Discussed again the importance of quitting smoking. PFTs in 2018 without obstructive disease, however given there is now concern for development of chronic bronchitis and continued risk factors repeat studies ordered in July 2025. PFTs not yet scheduled, message sent to referral coordinator to confirm approval. SABA not providing symptomatic relief. We will hold off on daily inhaler therapy pending PFTs.  -Given additional symptoms of GERD, I have recommended Ms. Peedin take PPI daily rather than prn to see if this impacts cough symptoms. She reports she had endoscopy completed earlier this year. It appears EGD was done 05/2023 with dilation. No improvement in dysphagia symptoms following dilation. GI recommended daily PPI, consideration of barium esophagram and f/u in 6 months at visit 07/08/23. I have recommended adherence to daily PPI to assess if further investigation is needed. -New, right-sided radiculopathy symptoms in the last 3 weeks. Mild weakness on exam. Patient reports sensory changes of the right side. No incontinence or left-sided symptoms. Will pursue PT and MRI. -Increase water intake to help with cramps and urination. -Advise adherence to daily statin therapy for CV prevention. Will check lipids at future visit if adherence improved.  Karna Fellows, MD 01/20/2024, 12:01 PM

## 2024-01-29 ENCOUNTER — Ambulatory Visit
Admission: RE | Admit: 2024-01-29 | Discharge: 2024-01-29 | Disposition: A | Discharge status: Home / Self Care | Source: Ambulatory Visit | Attending: Internal Medicine | Admitting: Internal Medicine

## 2024-01-29 DIAGNOSIS — M47816 Spondylosis without myelopathy or radiculopathy, lumbar region: Secondary | ICD-10-CM | POA: Diagnosis not present

## 2024-01-29 DIAGNOSIS — M5416 Radiculopathy, lumbar region: Secondary | ICD-10-CM

## 2024-02-05 ENCOUNTER — Inpatient Hospital Stay (HOSPITAL_COMMUNITY): Admission: RE | Admit: 2024-02-05 | Source: Ambulatory Visit

## 2024-02-06 ENCOUNTER — Other Ambulatory Visit

## 2024-02-06 DIAGNOSIS — Z01818 Encounter for other preprocedural examination: Secondary | ICD-10-CM | POA: Diagnosis not present

## 2024-02-06 DIAGNOSIS — H25812 Combined forms of age-related cataract, left eye: Secondary | ICD-10-CM | POA: Diagnosis not present

## 2024-02-12 DIAGNOSIS — H25812 Combined forms of age-related cataract, left eye: Secondary | ICD-10-CM | POA: Diagnosis not present

## 2024-02-13 DIAGNOSIS — H25811 Combined forms of age-related cataract, right eye: Secondary | ICD-10-CM | POA: Diagnosis not present

## 2024-02-13 DIAGNOSIS — Z961 Presence of intraocular lens: Secondary | ICD-10-CM | POA: Diagnosis not present

## 2024-02-13 DIAGNOSIS — Z9842 Cataract extraction status, left eye: Secondary | ICD-10-CM | POA: Diagnosis not present

## 2024-02-17 ENCOUNTER — Ambulatory Visit: Payer: Self-pay | Admitting: Internal Medicine

## 2024-02-17 NOTE — Progress Notes (Signed)
 Reviewed imaging results with Theresa Kirk. Encouraged to continue with our plan to pursue PT. She will return to clinic with any worsening or change in symptoms.

## 2024-02-27 DIAGNOSIS — H25811 Combined forms of age-related cataract, right eye: Secondary | ICD-10-CM | POA: Diagnosis not present

## 2024-02-27 DIAGNOSIS — Z9842 Cataract extraction status, left eye: Secondary | ICD-10-CM | POA: Diagnosis not present

## 2024-02-27 DIAGNOSIS — Z961 Presence of intraocular lens: Secondary | ICD-10-CM | POA: Diagnosis not present

## 2024-03-08 ENCOUNTER — Inpatient Hospital Stay (HOSPITAL_COMMUNITY): Admission: RE | Admit: 2024-03-08 | Source: Ambulatory Visit

## 2024-03-15 ENCOUNTER — Ambulatory Visit (HOSPITAL_COMMUNITY)
Admission: RE | Admit: 2024-03-15 | Discharge: 2024-03-15 | Disposition: A | Discharge status: Home / Self Care | Source: Ambulatory Visit | Attending: Internal Medicine | Admitting: Internal Medicine

## 2024-03-15 DIAGNOSIS — R053 Chronic cough: Secondary | ICD-10-CM | POA: Insufficient documentation

## 2024-03-15 LAB — PULMONARY FUNCTION TEST
DL/VA % pred: 98 %
DL/VA: 4.11 ml/min/mmHg/L
DLCO unc % pred: 81 %
DLCO unc: 16.99 ml/min/mmHg
FEF 25-75 Post: 2.27 L/s
FEF 25-75 Pre: 1.51 L/s
FEF2575-%Change-Post: 50 %
FEF2575-%Pred-Post: 95 %
FEF2575-%Pred-Pre: 63 %
FEV1-%Change-Post: 11 %
FEV1-%Pred-Post: 75 %
FEV1-%Pred-Pre: 67 %
FEV1-Post: 1.99 L
FEV1-Pre: 1.78 L
FEV1FVC-%Change-Post: 3 %
FEV1FVC-%Pred-Pre: 98 %
FEV6-%Change-Post: 10 %
FEV6-%Pred-Post: 76 %
FEV6-%Pred-Pre: 69 %
FEV6-Post: 2.51 L
FEV6-Pre: 2.27 L
FEV6FVC-%Change-Post: 0 %
FEV6FVC-%Pred-Post: 103 %
FEV6FVC-%Pred-Pre: 103 %
FVC-%Change-Post: 7 %
FVC-%Pred-Post: 73 %
FVC-%Pred-Pre: 68 %
FVC-Post: 2.51 L
FVC-Pre: 2.33 L
Post FEV1/FVC ratio: 79 %
Post FEV6/FVC ratio: 100 %
Pre FEV1/FVC ratio: 77 %
Pre FEV6/FVC Ratio: 99 %

## 2024-03-15 MED ORDER — ALBUTEROL SULFATE (2.5 MG/3ML) 0.083% IN NEBU
2.5000 mg | INHALATION_SOLUTION | Freq: Once | RESPIRATORY_TRACT | Status: AC
  Administered 2024-03-15: 2.5 mg via RESPIRATORY_TRACT

## 2024-04-26 ENCOUNTER — Telehealth: Payer: Self-pay | Admitting: *Deleted

## 2024-04-26 NOTE — Telephone Encounter (Signed)
 Will forward to PCP.                          Copied from CRM #[id number]. Topic: General - Other >> Apr 26, 2024  2:00 PM Susanna ORN wrote: Reason for CRM: Patient states she has jury duty and needs a paper stating that she has back and knee problems. Also wants her PCP to call her as she has questions about a paper that she may need to get in [place]. Please have provider to give patient a call. CB #: [phone].

## 2024-04-26 NOTE — Telephone Encounter (Signed)
 Not currently receiving medication for depression/anxiety through our office. Will need to discuss mood symptoms, behavioral health f/u at upcoming appointment 12/2. Paperwork for disability placard has been filled out and placed in records for faxing.

## 2024-04-26 NOTE — Telephone Encounter (Signed)
 RTC to patient stated that she saw the [facility] for her Anxiety.  Wants to know since you also gave her Anxiety medication if she needs to contact them as well.  Also has dropped off the form for a Handicap Placard for her car to be completed.

## 2024-04-26 NOTE — Telephone Encounter (Signed)
 RTC to patient will need to follow up with [facility] for Anxiety medications and letter for [employer].  Also reminded patient that her Placard his been completed and that she has an appointment with Dr. Karna on 05/04/2024 at 8:15 AM.

## 2024-05-04 ENCOUNTER — Encounter: Payer: Self-pay | Admitting: Internal Medicine

## 2024-05-04 ENCOUNTER — Ambulatory Visit: Payer: Self-pay | Admitting: Internal Medicine

## 2024-05-04 ENCOUNTER — Other Ambulatory Visit: Payer: Self-pay

## 2024-05-04 VITALS — BP 143/82 | HR 70 | Temp 97.6°F | Ht 65.0 in | Wt 196.8 lb

## 2024-05-04 DIAGNOSIS — G8929 Other chronic pain: Secondary | ICD-10-CM

## 2024-05-04 DIAGNOSIS — J441 Chronic obstructive pulmonary disease with (acute) exacerbation: Secondary | ICD-10-CM | POA: Insufficient documentation

## 2024-05-04 DIAGNOSIS — M5442 Lumbago with sciatica, left side: Secondary | ICD-10-CM

## 2024-05-04 DIAGNOSIS — Z7952 Long term (current) use of systemic steroids: Secondary | ICD-10-CM | POA: Diagnosis not present

## 2024-05-04 DIAGNOSIS — M1711 Unilateral primary osteoarthritis, right knee: Secondary | ICD-10-CM

## 2024-05-04 DIAGNOSIS — Z8249 Family history of ischemic heart disease and other diseases of the circulatory system: Secondary | ICD-10-CM

## 2024-05-04 DIAGNOSIS — J449 Chronic obstructive pulmonary disease, unspecified: Secondary | ICD-10-CM | POA: Insufficient documentation

## 2024-05-04 DIAGNOSIS — F331 Major depressive disorder, recurrent, moderate: Secondary | ICD-10-CM

## 2024-05-04 DIAGNOSIS — F1290 Cannabis use, unspecified, uncomplicated: Secondary | ICD-10-CM

## 2024-05-04 DIAGNOSIS — Z87891 Personal history of nicotine dependence: Secondary | ICD-10-CM

## 2024-05-04 DIAGNOSIS — R030 Elevated blood-pressure reading, without diagnosis of hypertension: Secondary | ICD-10-CM | POA: Insufficient documentation

## 2024-05-04 DIAGNOSIS — E785 Hyperlipidemia, unspecified: Secondary | ICD-10-CM

## 2024-05-04 DIAGNOSIS — M5441 Lumbago with sciatica, right side: Secondary | ICD-10-CM

## 2024-05-04 MED ORDER — PREDNISONE 20 MG PO TABS
40.0000 mg | ORAL_TABLET | Freq: Every day | ORAL | 0 refills | Status: AC
  Filled 2024-05-04: qty 10, 5d supply, fill #0

## 2024-05-04 MED ORDER — ALBUTEROL SULFATE HFA 108 (90 BASE) MCG/ACT IN AERS
2.0000 | INHALATION_SPRAY | Freq: Four times a day (QID) | RESPIRATORY_TRACT | 1 refills | Status: DC | PRN
  Filled 2024-05-04: qty 18, 25d supply, fill #0

## 2024-05-04 NOTE — Assessment & Plan Note (Signed)
 Chronic back pain with occasional radiculopathy symptoms. Facet hypertrophy noted on MRI in September. Referred to PT at last visit but did not attend due to history of ineffectiveness for knee pain. We discussed alternative indication and continued recommendation. Phone number provided to call and schedule sessions.

## 2024-05-04 NOTE — Assessment & Plan Note (Signed)
 Continues to be inconsistent with rosuvastatin . Encouraged regular adherence and recheck lipids at a future visit when taking consistently.

## 2024-05-04 NOTE — Progress Notes (Signed)
 Established Patient Office Visit  Subjective   Patient ID: Theresa Kirk, female    DOB: [birth year]  Age: 63 y.o. MRN: [id number]  Chief Complaint  Patient presents with   Follow-up   Cough    Sinus/nasal congestion. Chills.   Handicap placket    Theresa Kirk returns to clinic today for cold symptoms and follow-up of chronic medical conditions. Please see assessment/plan in problem-based charting for further details of today's visit.    Patient Active Problem List   Diagnosis Date Noted   COPD exacerbation (HCC) 05/04/2024   Elevated blood pressure reading without diagnosis of hypertension 05/04/2024   Numbness and tingling of right leg 01/20/2024   Urticarial rash 12/22/2023   Schatzki's ring 09/26/2023   Hiatal hernia 09/26/2023   Tubular adenoma of colon 09/26/2023   Obesity (BMI 30.0-34.9) 09/23/2023   Moderate recurrent major depression (HCC) 07/14/2023   Tinea pedis of left foot 07/14/2023   Cataract, nuclear sclerotic, both eyes 03/04/2023   Restless leg syndrome 08/12/2022   Peripheral polyneuropathy 08/12/2022   Leg cramping 09/18/2021   Sensorineural hearing loss (SNHL) of both ears 01/25/2020   Tinnitus 09/21/2019   Hot flashes 04/20/2019   Numerous moles 04/20/2019   Back pain 07/29/2017   Feeling of incomplete bladder emptying 02/27/2017   Prediabetes 09/29/2014   Bilateral ocular hypertension 06/27/2014   Cervical pain (neck) 05/05/2014   Chronic cough 12/09/2012   Seasonal allergies 09/23/2012   Arthritis of right knee 03/26/2012   Carpal tunnel syndrome 06/13/2010   Hyperlipidemia 11/06/2006      Objective:     BP (!) 143/82 (BP Location: Right Arm, Patient Position: Sitting, Cuff Size: Normal)   Pulse 70   Temp 97.6 F (36.4 C) (Oral)   Ht 5' 5 (1.651 m)   Wt 196 lb 12.8 oz (89.3 kg)   LMP 09/14/2006   SpO2 97% Comment: RA  BMI 32.75 kg/m  BP Readings from Last 3 Encounters:  05/04/24 (!) 143/82  01/20/24 133/78  12/22/23 112/73    Wt Readings from Last 3 Encounters:  05/04/24 196 lb 12.8 oz (89.3 kg)  01/20/24 201 lb 3.2 oz (91.3 kg)  12/24/23 201 lb (91.2 kg)    Physical Exam Constitutional:      Appearance: Normal appearance. She is ill-appearing.  Eyes:     Conjunctiva/sclera: Conjunctivae normal.  Cardiovascular:     Rate and Rhythm: Normal rate and regular rhythm.  Pulmonary:     Effort: Pulmonary effort is normal.     Breath sounds: Wheezes: mild wheezes diffusely.  Skin:    General: Skin is warm and dry.  Neurological:     Mental Status: She is alert.  Psychiatric:        Mood and Affect: Mood normal.        Behavior: Behavior normal.       Assessment & Plan:   Problem List Items Addressed This Visit       Respiratory   COPD exacerbation (HCC) - Primary   PFTs completed in October 2025, diagnosis of moderate obstructive airways disease without bronchodilator response noted with reduced FEV1 and FVC (although FEV1/FVC ratio 77%). Mild centrilobular emphysema seen on CT in 2021. Concern for COPD 2/2 history of smoking exposure. No history of frequent exacerbation or hospitalization. Chronic cough noted earlier this year although Theresa Kirk reports it had gotten better until last week.  On Wednesday of last week, Theresa Kirk noted URI symptoms of nasal congestion, sneezing, and productive cough. Some  shortness of breath and wheezing. Denies any fevers. She has taken multiple over the counter cough/cold medications with improved chest congestion, nasal congestion, and cough in the last two days. Oxygen saturation appropriate, no respiratory distress or increased effort on exam. Mild wheezing diffusely in bilateral lungs.   I have reviewed PFT results with Theresa Kirk today and my concern for COPD diagnosis. It sounds like her chronic symptoms were improved prior to recent illness. Suspect current viral infection causing mild COPD exacerbation. Discussed prn bronchodilator therapy and short course  of steroids. I do not suspect the need for antibiotics at this time. Out of the window for COVID or flu treatment. We again reviewed the need to quit smoking altogether (now smoking marijuana only). Return precautions for worsening reviewed, as well as plan for close f/u in two weeks. Will reassess need for maintenance inhaler at that time.       Relevant Medications   predniSONE  (DELTASONE ) 20 MG tablet   albuterol  (VENTOLIN  HFA) 108 (90 Base) MCG/ACT inhaler     Musculoskeletal and Integument   Arthritis of right knee   Chronic right knee pain due to osteoarthritis. Using a cane for assistance and stability. Not interested in PT for knee pain as she previously attempted this and noted no improvement/worsening. Interested in steroid injections.  Plan -Referral to sports medicine for consideration of injection -Acetaminophen  and Voltaren  gel for pain      Relevant Medications   predniSONE  (DELTASONE ) 20 MG tablet   Other Relevant Orders   Ambulatory referral to Sports Medicine     Other   Hyperlipidemia   Continues to be inconsistent with rosuvastatin . Encouraged regular adherence and recheck lipids at a future visit when taking consistently.      Back pain   Chronic back pain with occasional radiculopathy symptoms. Facet hypertrophy noted on MRI in September. Referred to PT at last visit but did not attend due to history of ineffectiveness for knee pain. We discussed alternative indication and continued recommendation. Phone number provided to call and schedule sessions.      Relevant Medications   predniSONE  (DELTASONE ) 20 MG tablet   Moderate recurrent major depression (HCC)   History of depression and anxiety. PHQ10 today, no self-harm thoughts reported. Previously not interested in daily medication and referred to [facility] for counseling. Today, she reports she has plans to re-establish with [facility] for behavioral health services including medication and counseling.         Elevated blood pressure reading without diagnosis of hypertension   Elevated blood pressure in clinic today. May be due to current illness and/or OTC medications. No history of hypertension and well controlled at prior visits. Close f/u scheduled.       Return in about 2 weeks (around 05/18/2024) for f/u BP and cough symptoms.    Ronnald Sergeant, MD

## 2024-05-04 NOTE — Assessment & Plan Note (Signed)
 PFTs completed in October 2025, diagnosis of moderate obstructive airways disease without bronchodilator response noted with reduced FEV1 and FVC (although FEV1/FVC ratio 77%). Mild centrilobular emphysema seen on CT in 2021. Concern for COPD 2/2 history of smoking exposure. No history of frequent exacerbation or hospitalization. Chronic cough noted earlier this year although Theresa Kirk reports it had gotten better until last week.  On Wednesday of last week, Theresa Kirk noted URI symptoms of nasal congestion, sneezing, and productive cough. Some shortness of breath and wheezing. Denies any fevers. She has taken multiple over the counter cough/cold medications with improved chest congestion, nasal congestion, and cough in the last two days. Oxygen saturation appropriate, no respiratory distress or increased effort on exam. Mild wheezing diffusely in bilateral lungs.   I have reviewed PFT results with Theresa Kirk today and my concern for COPD diagnosis. It sounds like her chronic symptoms were improved prior to recent illness. Suspect current viral infection causing mild COPD exacerbation. Discussed prn bronchodilator therapy and short course of steroids. I do not suspect the need for antibiotics at this time. Out of the window for COVID or flu treatment. We again reviewed the need to quit smoking altogether (now smoking marijuana only). Return precautions for worsening reviewed, as well as plan for close f/u in two weeks. Will reassess need for maintenance inhaler at that time.

## 2024-05-04 NOTE — Assessment & Plan Note (Signed)
 Chronic right knee pain due to osteoarthritis. Using a cane for assistance and stability. Not interested in PT for knee pain as she previously attempted this and noted no improvement/worsening. Interested in steroid injections.  Plan -Referral to sports medicine for consideration of injection -Acetaminophen  and Voltaren  gel for pain

## 2024-05-04 NOTE — Patient Instructions (Addendum)
 It was wonderful to see you today!  -For nasal congestion, you may try Afrin nasal spray twice daily. Do NOT use for more than 3 days.   -Start taking prednisone  40 mg daily for 5 days. Use albuterol  inhaler every 6 hours as needed for shortness of breath, cough, and/or wheezing.  -Contact physical therapy below for your back: [facility] Physical therapist in [place], [place]  Address: [address] [place], [place], [place] [address] Phone: [phone]  -I have referred you to sports medicine for your right knee pain.  -We'll see you back in TWO weeks for follow-up of your blood pressure and cold symptoms.  -Please start taking your Crestor  (rosuvastatin ) again for cholesterol.

## 2024-05-04 NOTE — Assessment & Plan Note (Signed)
 Elevated blood pressure in clinic today. May be due to current illness and/or OTC medications. No history of hypertension and well controlled at prior visits. Close f/u scheduled.

## 2024-05-04 NOTE — Assessment & Plan Note (Addendum)
 History of depression and anxiety. PHQ10 today, no self-harm thoughts reported. Previously not interested in daily medication and referred to [facility] for counseling. Today, she reports she has plans to re-establish with [facility] for behavioral health services including medication and counseling.

## 2024-05-10 ENCOUNTER — Ambulatory Visit: Admitting: Family Medicine

## 2024-05-19 ENCOUNTER — Other Ambulatory Visit: Payer: Self-pay

## 2024-05-19 ENCOUNTER — Ambulatory Visit: Admitting: Student

## 2024-05-19 VITALS — BP 118/71 | HR 74 | Temp 98.2°F | Ht 65.0 in | Wt 200.0 lb

## 2024-05-19 DIAGNOSIS — R030 Elevated blood-pressure reading, without diagnosis of hypertension: Secondary | ICD-10-CM

## 2024-05-19 DIAGNOSIS — J302 Other seasonal allergic rhinitis: Secondary | ICD-10-CM

## 2024-05-19 DIAGNOSIS — R053 Chronic cough: Secondary | ICD-10-CM

## 2024-05-19 DIAGNOSIS — Z23 Encounter for immunization: Secondary | ICD-10-CM

## 2024-05-19 DIAGNOSIS — J449 Chronic obstructive pulmonary disease, unspecified: Secondary | ICD-10-CM

## 2024-05-19 DIAGNOSIS — R252 Cramp and spasm: Secondary | ICD-10-CM

## 2024-05-19 MED ORDER — CETIRIZINE HCL 10 MG PO TABS
10.0000 mg | ORAL_TABLET | Freq: Every day | ORAL | 2 refills | Status: AC
  Filled 2024-05-19: qty 30, 30d supply, fill #0

## 2024-05-19 NOTE — Assessment & Plan Note (Addendum)
 Patient returns 2 weeks after being diagnosed with a mild COPD exacerbation and prescribed short acting beta agonist.  She continues to have a productive cough with white/yellow sputum but overall her symptoms are improving and she has not had any new or worsening shortness of breath, fevers, hemoptysis, purulent sputum.  She has been using the albuterol  mainly in the morning and has found benefit with this.  On exam lungs are clear and amatory pulse ox was normal.  Overall she is slowly recovering from her mild COPD exacerbation and I would like her to continue to use the albuterol  as needed.  She will likely need a maintenance inhaler at some point but I would like to see how much her symptoms improve over the next 2-4 weeks before starting this.

## 2024-05-19 NOTE — Patient Instructions (Addendum)
 Thank you, Ms.Anastaisa Rosamond, for allowing us  to provide your care today. Today we discussed . . .  > Cough       - I would like you to continue to use the albuterol  inhaler as needed and resume using the cetirizine .  You can also Flonase  (fluticasone ) nasal spray daily especially if the cetirizine  starts to help your symptoms.  If you develop any fevers, shortness of breath, worsening cough, or any blood in your sputum please let us  know right away.  If your symptoms do not get better over the next few weeks we may discuss starting a daily inhaler that works longer than the albuterol . > Muscle cramps       - I am glad that you are going to see sports medicine soon and I would like you to mention these symptoms to them as well.  If you can let us  know the medicine that you took that worked for your symptoms we may be able to prescribe it for a short period of time if we think it is a safe option.  We are also going to recheck your electrolytes to make sure there are no abnormalities that could be causing these cramps and I will call you if there are are any abnormalities.  Follow up: 3 months    Remember:  Should you have any questions or concerns please call the internal medicine clinic at [phone].     Fairy Pool, DO [facility]

## 2024-05-19 NOTE — Assessment & Plan Note (Addendum)
 Resume cetirizine  and also counseled on Flonase  if needed.

## 2024-05-19 NOTE — Progress Notes (Signed)
 CC: Routine Follow Up for elevated blood pressure after last office visit 05/04/2024  HPI:  Theresa Kirk is a 63 y.o. female with pertinent PMH of COPD, hyperlipidemia, and elevated blood pressures without diagnosis of hypertension who presents as above. Please see assessment and plan below for further details.  Medications: Current Outpatient Medications  Medication Instructions   albuterol  (VENTOLIN  HFA) 108 (90 Base) MCG/ACT inhaler 2 puffs, Inhalation, Every 6 hours PRN   cetirizine  (ZYRTEC  ALLERGY) 10 mg, Oral, Daily   diclofenac  Sodium (VOLTAREN ) 4 g, Topical, 4 times daily   pantoprazole  (PROTONIX ) 40 MG tablet TAKE 1 TABLET BY MOUTH EVERY DAY(TAKE 30 MINS BEFORE MEALS)   rosuvastatin  (CRESTOR ) 10 mg, Oral, Daily     Review of Systems:   Pertinent items noted in HPI and/or A&P.  Physical Exam:  Vitals:   05/19/24 0810  BP: 118/71  Pulse: 74  Temp: 98.2 F (36.8 C)  TempSrc: Oral  SpO2: 97%  Weight: 200 lb (90.7 kg)  Height: 5' 5 (1.651 m)    Constitutional: Well-appearing elderly female. In no acute distress. HEENT: Normocephalic, atraumatic, Sclera non-icteric, PERRL, EOM intact Cardio:Regular rate and rhythm. 2+ bilateral radial pulses. Pulm:Clear to auscultation bilaterally. Normal work of breathing on room air. Abdomen: Soft, non-tender, non-distended, positive bowel sounds. FDX:Wzhjupcz for extremity edema.  No thoracic or lumbar spinal tenderness either midline or paraspinal Skin:Warm and dry. Neuro:Alert and oriented x3. No focal deficit noted. Psych:Pleasant mood and affect.   Assessment & Plan:   Assessment & Plan Chronic obstructive pulmonary disease, unspecified COPD type (HCC) Chronic cough Patient returns 2 weeks after being diagnosed with a mild COPD exacerbation and prescribed short acting beta agonist.  She continues to have a productive cough with white/yellow sputum but overall her symptoms are improving and she has not had any new or  worsening shortness of breath, fevers, hemoptysis, purulent sputum.  She has been using the albuterol  mainly in the morning and has found benefit with this.  On exam lungs are clear and amatory pulse ox was normal.  Overall she is slowly recovering from her mild COPD exacerbation and I would like her to continue to use the albuterol  as needed.  She will likely need a maintenance inhaler at some point but I would like to see how much her symptoms improve over the next 2-4 weeks before starting this. Muscle cramps Patient has chronic issue with abdominal muscle cramps that mainly happen at the end of the day.  She has had trouble sleeping due to these recently.  She denies any radicular symptoms, GI symptoms, or other associated symptoms.  She previously had leg cramps but denies any issues with these recently.  She did take a medication from her sister that she says was a muscle relaxer in the day that helped her symptoms a lot.  She has a follow-up with sports medicine to further evaluate her leg and I recommend that she discusses the symptoms with sports medicine.  On exam there is no spinal or paraspinal tenderness, no abdominal tenderness, and no active cramping.  Overall most consistent with chronic muscle deconditioning and we will recheck electrolytes today, encouraged continued good hydration, and follow-up with sports medicine.  If she is able to tell us  the name of the muscle relaxer she took we may be able to prescribe a short course for as needed use especially to help her sleep. - BMP and magnesium level today Seasonal allergies Resume cetirizine  and also counseled on Flonase  if  needed.  Orders Placed This Encounter  Procedures   Flu vaccine trivalent PF, 6mos and older(Flulaval,Afluria,Fluarix,Fluzone)   Basic metabolic panel with GFR   Magnesium     Return in about 3 months (around 08/17/2024) for Routine Follow Up.   Patient discussed with Dr. Mliss Trudy Fairy Jolaine,  DO [facility] Internal Medicine Resident PGY-3 Clinic Phone: [phone] Please contact the on call pager at [phone] for any urgent or emergent needs.

## 2024-05-20 ENCOUNTER — Encounter: Payer: Self-pay | Admitting: Family Medicine

## 2024-05-20 ENCOUNTER — Ambulatory Visit (INDEPENDENT_AMBULATORY_CARE_PROVIDER_SITE_OTHER): Admitting: Family Medicine

## 2024-05-20 VITALS — BP 117/71 | Ht 65.0 in | Wt 200.0 lb

## 2024-05-20 DIAGNOSIS — M1711 Unilateral primary osteoarthritis, right knee: Secondary | ICD-10-CM | POA: Diagnosis not present

## 2024-05-20 DIAGNOSIS — G8929 Other chronic pain: Secondary | ICD-10-CM

## 2024-05-20 DIAGNOSIS — M25561 Pain in right knee: Secondary | ICD-10-CM

## 2024-05-20 MED ORDER — METHYLPREDNISOLONE ACETATE 40 MG/ML IJ SUSP
40.0000 mg | Freq: Once | INTRAMUSCULAR | Status: AC
  Administered 2024-05-20: 09:00:00 40 mg via INTRA_ARTICULAR

## 2024-05-20 NOTE — Patient Instructions (Signed)

## 2024-05-20 NOTE — Progress Notes (Signed)
 DATE OF VISIT: 05/20/2024        Theresa Kirk DOB: 03-15-1961 MRN: [id number]  Discussed the use of AI scribe software for clinical note transcription with the patient, who gave verbal consent to proceed.  History of Present Illness Theresa Kirk is a 63 year old female with osteoarthritis who presents with right knee pain. She was referred by her primary care physician for evaluation of right knee pain and consideration of possible injection.  Right knee pain - Persistent right knee pain for over one year, progressively worsening - Pain sometimes associated with swelling and occasionally disrupts sleep - Walking short distances exacerbates pain, especially on more severe days - Occasional clicking or popping in the right knee - No significant symptoms in the left knee  Functional impairment - Uses a cane for mobility for approximately six months - Has a temporary handicap placard due to mobility limitations  Prior imaging and diagnosis - X-rays performed in April 2025 demonstrated osteoarthritis of the right knee  Therapeutic interventions and response - Physical therapy attempted for six weeks but exacerbated symptoms, leading to discontinuation - Tylenol  used for severe pain - Avoids ibuprofen  due to stomach discomfort - Over-the-counter topical pain relief creams provide temporary relief - Heating pad initially provided relief but is now less effective - Knee brace offers some support but does not alleviate sensation of 'bone to bone' contact - Topical Voltaren  gel has not been effective    Medications:  Outpatient Encounter Medications as of 05/20/2024  Medication Sig   albuterol  (VENTOLIN  HFA) 108 (90 Base) MCG/ACT inhaler Inhale 2 puffs into the lungs every 6 (six) hours as needed for wheezing or shortness of breath.   cetirizine  (ZYRTEC  ALLERGY) 10 MG tablet Take 1 tablet (10 mg total) by mouth daily.   diclofenac  Sodium (VOLTAREN ) 1 % GEL Apply 4 g topically 4  (four) times daily. (Patient not taking: Reported on 12/24/2023)   pantoprazole  (PROTONIX ) 40 MG tablet TAKE 1 TABLET BY MOUTH EVERY DAY(TAKE 30 MINS BEFORE MEALS)   rosuvastatin  (CRESTOR ) 10 MG tablet Take 1 tablet (10 mg total) by mouth daily. (Patient not taking: Reported on 12/24/2023)   No facility-administered encounter medications on file as of 05/20/2024.    Allergies: is allergic to iohexol , ibuprofen , and cephalexin .  Physical Examination: Vitals: BP 117/71   Ht 5' 5 (1.651 m)   Wt 200 lb (90.7 kg)   LMP 09/14/2006   BMI 33.28 kg/m  GENERAL:  Theresa Kirk is a 63 y.o. female appearing their stated age, alert and oriented x 3, in no apparent distress.  SKIN: no rashes or lesions, skin clean, dry, intact MSK: Right knee without swelling or effusion.  Full range of motion with 1+ patellofemoral crepitus.  Tender palpation along the medial and lateral joint line.  Negative McMurray, negative Lachman, negative varus and valgus stress.  Left knee with full range of motion without pain or weakness. Walking with antalgic gait with use of a single pod cane NEURO: sensation intact to light touch VASC: pulses 2+ and symmetric DP/PT bilaterally, no edema  Radiology: Right knee x-ray 09/23/2023 personally reviewed and interpreted by me today showing: - Mild to moderate tricompartmental OA, most prominent in the medial compartment - Moderate effusion  Assessment & Plan Acute on chronic right knee pain that has been worsening with associated underlying tricompartmental osteoarthritis Chronic right knee pain with mild to moderate osteoarthritis and inflammation. Previous treatments ineffective including PT, topical NSAIDs, cannot take oral NSAIDs due to stomach irritation -  PCP notes reviewed as noted above.  X-rays reviewed as noted above.  Discussed treatment options.  Would be a candidate for cortisone injection.  Would like to proceed with this today. - Administered cortisone injection  today as noted below- Advised rest for 2-3 days post-injection. - Continue using cane for ambulation. - Use topical medications and knee brace as needed. - Follow-up 6 weeks for reevaluation,consider ironic acid injections if cortisone injection is ineffective.  PROCEDURE:  Risks & benefits of RT knee cortisone injection reviewed. Consent obtained. Time-out completed. Patient prepped and draped in the normal fashion. Area cleansed with alcohol. Ethyl chloride spray used to anesthetize the skin. Solution of 4 mL 1% lidocaine  with 1 mL methylprednisolone  (Depo-medrol ) 40mg /mL injected into the RT knee using a 25-gauge 1.5-inch needle via the anterior medial approach. Patient tolerated procedure well without any complications. Area covered with adhesive bandage.  Post-procedure care reviewed, all questions answered.      Patient expressed understanding & agreement with above.  Encounter Diagnoses  Name Primary?   Arthritis of right knee Yes   Chronic pain of right knee     No orders of the defined types were placed in this encounter.    Contains text generated by [employer]bridge.

## 2024-05-20 NOTE — Addendum Note (Signed)
 Addended by: MARTHA BOUCHARD E on: 05/20/2024 08:52 AM   Modules accepted: Orders

## 2024-05-20 NOTE — Addendum Note (Signed)
 Addended by: Jenne Sellinger on: 05/20/2024 09:03 AM   Modules accepted: Orders

## 2024-05-21 NOTE — Assessment & Plan Note (Signed)
 Returns for follow-up for previously elevated blood pressures.  Blood pressure at this visit 118/71.  He will continue to monitor at further visits and not start any antihypertensives.

## 2024-05-21 NOTE — Progress Notes (Signed)
 Internal Medicine Clinic Attending  Case discussed with the resident at the time of the visit.  We reviewed the resident's history and exam and pertinent patient test results.  I agree with the assessment, diagnosis, and plan of care documented in the resident's note.

## 2024-05-24 ENCOUNTER — Telehealth: Payer: Self-pay | Admitting: *Deleted

## 2024-05-24 NOTE — Telephone Encounter (Signed)
 Pt was seen by Dr Jolaine on 12/17.

## 2024-05-24 NOTE — Telephone Encounter (Signed)
 Copied from CRM #[id number]. Topic: Clinical - Medication Question >> May 21, 2024  2:02 PM Debby BROCKS wrote: Reason for CRM: Patient called in asking for Cyclobenzaprine  HCL 10 mg to be prescribed. She was advised to get the name of the medication and call back with it as she did not remember the name during her office visit

## 2024-05-26 ENCOUNTER — Other Ambulatory Visit: Payer: Self-pay

## 2024-05-26 MED ORDER — CYCLOBENZAPRINE HCL 10 MG PO TABS: 10.0000 mg | ORAL_TABLET | Freq: Three times a day (TID) | ORAL | 0 refills | Status: AC | PRN

## 2024-06-24 ENCOUNTER — Ambulatory Visit: Admitting: Student

## 2024-06-24 ENCOUNTER — Other Ambulatory Visit: Payer: Self-pay

## 2024-06-24 ENCOUNTER — Encounter: Payer: Self-pay | Admitting: Student

## 2024-06-24 VITALS — BP 117/77 | HR 79 | Temp 98.3°F | Ht 65.0 in | Wt 209.6 lb

## 2024-06-24 DIAGNOSIS — Z6834 Body mass index (BMI) 34.0-34.9, adult: Secondary | ICD-10-CM | POA: Diagnosis not present

## 2024-06-24 DIAGNOSIS — E66811 Obesity, class 1: Secondary | ICD-10-CM

## 2024-06-24 DIAGNOSIS — J449 Chronic obstructive pulmonary disease, unspecified: Secondary | ICD-10-CM

## 2024-06-24 DIAGNOSIS — E669 Obesity, unspecified: Secondary | ICD-10-CM

## 2024-06-24 DIAGNOSIS — L2082 Flexural eczema: Secondary | ICD-10-CM | POA: Diagnosis not present

## 2024-06-24 DIAGNOSIS — G2581 Restless legs syndrome: Secondary | ICD-10-CM

## 2024-06-24 DIAGNOSIS — R252 Cramp and spasm: Secondary | ICD-10-CM

## 2024-06-24 DIAGNOSIS — J441 Chronic obstructive pulmonary disease with (acute) exacerbation: Secondary | ICD-10-CM

## 2024-06-24 DIAGNOSIS — R7303 Prediabetes: Secondary | ICD-10-CM

## 2024-06-24 DIAGNOSIS — E1142 Type 2 diabetes mellitus with diabetic polyneuropathy: Secondary | ICD-10-CM | POA: Diagnosis not present

## 2024-06-24 DIAGNOSIS — G629 Polyneuropathy, unspecified: Secondary | ICD-10-CM

## 2024-06-24 DIAGNOSIS — E1169 Type 2 diabetes mellitus with other specified complication: Secondary | ICD-10-CM

## 2024-06-24 MED ORDER — ALBUTEROL SULFATE HFA 108 (90 BASE) MCG/ACT IN AERS
2.0000 | INHALATION_SPRAY | Freq: Four times a day (QID) | RESPIRATORY_TRACT | 1 refills | Status: AC | PRN
  Filled 2024-06-24: qty 18, 25d supply, fill #0

## 2024-06-24 MED ORDER — ROPINIROLE HCL 0.5 MG PO TABS
0.5000 mg | ORAL_TABLET | Freq: Every day | ORAL | 11 refills | Status: AC
  Filled 2024-06-24: qty 30, 30d supply, fill #0

## 2024-06-24 MED ORDER — HYDROCORTISONE 1 % EX OINT
1.0000 | TOPICAL_OINTMENT | Freq: Two times a day (BID) | CUTANEOUS | 0 refills | Status: AC
  Filled 2024-06-24: qty 42, 14d supply, fill #0

## 2024-06-24 NOTE — Patient Instructions (Addendum)
 Thank you, Ms.Lindsey Ganson for allowing us  to provide your care today. Today we discussed   Your muscle cramping and your restless legs. You can continue taking the muscle relaxant as needed but I am also going to check some labs.   In the meantime, let's give the Ropinirole  another try. You can start with one pill about two hours before bed. If this is not helping you sleep any by the second week, you can increase it to two pills before bed. -  Irritation of the L side of your body - continue using Dove - Moisturize completely after showers - Do not use scalding hot water - Moisturize throughout the day - Please use the hydrocortisone  ointment I prescribed twice daily for 14 days. After that, use as needed.   I have ordered the following labs for you:  Lab Orders         Comprehensive metabolic panel with GFR         Hemoglobin A1c       I will call if any are abnormal. All of your labs can be accessed through My Chart.   My Chart Access: [email or web address]  Please follow-up in: one month for restless leg, insomnia, and rash follow up    We look forward to seeing you next time. Please call our clinic at [phone] if you have any questions or concerns. The best time to call is Monday-Friday from 9am-4pm, but there is someone available 24/7. If after hours or the weekend, call the main hospital number and ask for the Internal Medicine Resident On-Call. If you need medication refills, please notify your pharmacy one week in advance and they will send us  a request.   Thank you for letting us  take part in your care. Wishing you the best!  Elnora Ip, MD 06/24/2024, 10:07 AM [facility]

## 2024-06-24 NOTE — Progress Notes (Unsigned)
 "   Subjective:  CC: Follow up   HPI:  [name] is a 64 y.o. female with a past medical history stated below and presents today for L sided rash, prediabetes follow up, and lower leg discomfort. Please see problem based assessment and plan for additional details.  Past Medical History:  Diagnosis Date   Abnormal mammogram with microcalcification 10/15/2013   Acquired hallux limitus of both feet 10/15/2019   Acute pain of right wrist 02/03/2018   Adverse effect of drug 08/03/2023   Alcohol abuse    Allergic reaction 08/03/2023   Arthritis    Asthma    Callus of foot 06/07/2008   Qualifier: Diagnosis of  By: Loletta MD, Vijay     Callus of foot 06/07/2008   Carpal tunnel syndrome    Cervical lymphadenopathy 09/21/2019   Cervical muscle pain 05/05/2014   - Pt with cervical pain radiating down her neck with no signs of radiculopathy or weakness in upper extremities   Depression    Fibromyalgia    Foot pain, left 08/12/2022   Healthcare maintenance 04/13/2012   Healthcare maintenance 04/13/2012   - Last Pap/ HPV testing in 2023      High cholesterol    Irritation symptom of skin 12/30/2017   Screening mammogram for breast cancer 08/12/2022    Medications Ordered Prior to Encounter[1]  Family History  Problem Relation Age of Onset   Diabetes Mother    Hypertension Mother    Stroke Mother    Diabetes Sister    Thyroid  disease Sister    Hypertension Sister    Cancer Maternal Aunt        unknown   Breast cancer Maternal Aunt     Social History   Socioeconomic History   Marital status: Single    Spouse name: Not on file   Number of children: Not on file   Years of education: Not on file   Highest education level: Not on file  Occupational History   Not on file  Tobacco Use   Smoking status: Former    Current packs/day: 0.00    Types: Cigarettes    Quit date: 06/03/2000    Years since quitting: 24.0   Smokeless tobacco: Never  Vaping Use   Vaping  status: Never Used  Substance and Sexual Activity   Alcohol use: Yes    Comment: Sometimes.   Drug use: Yes    Types: Marijuana   Sexual activity: Not on file  Other Topics Concern   Not on file  Social History Narrative   Lives with mother in [place].   Currently unemployed.   Used to work at [employer] in [place].   ETOH use: weekend, 1-2 beers from [employer]: used to drink everyday   Former smoker   Current THC use   H/o cocaine use: quit in 2001.   Social Drivers of Health   Tobacco Use: Medium Risk (06/24/2024)   Patient History    Smoking Tobacco Use: Former    Smokeless Tobacco Use: Never    Passive Exposure: Not on file  Financial Resource Strain: Medium Risk (06/24/2024)   Overall Financial Resource Strain (CARDIA)    Difficulty of Paying Living Expenses: Somewhat hard  Food Insecurity: Food Insecurity Present (06/24/2024)   Epic    Worried About Programme Researcher, Broadcasting/film/video in the Last Year: Sometimes true    Ran Out of Food in the Last Year: Sometimes true  Transportation Needs: No Transportation Needs (06/24/2024)   Epic  Lack of Transportation (Medical): No    Lack of Transportation (Non-Medical): No  Physical Activity: Insufficiently Active (06/24/2024)   Exercise Vital Sign    Days of Exercise per Week: 2 days    Minutes of Exercise per Session: 30 min  Stress: Stress Concern Present (06/24/2024)   [employer] of Occupational Health - Occupational Stress Questionnaire    Feeling of Stress: To some extent  Social Connections: Unknown (06/24/2024)   Social Connection and Isolation Panel    Frequency of Communication with Friends and Family: More than three times a week    Frequency of Social Gatherings with Friends and Family: Once a week    Attends Religious Services: More than 4 times per year    Active Member of [employer] or Organizations: No    Attends Banker Meetings: Never    Marital Status: Not on file  Intimate Partner Violence: Not At Risk  (06/24/2024)   Epic    Fear of Current or Ex-Partner: No    Emotionally Abused: No    Physically Abused: No    Sexually Abused: No  Depression (PHQ2-9): Low Risk (06/24/2024)   Depression (PHQ2-9)    PHQ-2 Score: 0  Recent Concern: Depression (PHQ2-9) - Medium Risk (05/04/2024)   Depression (PHQ2-9)    PHQ-2 Score: 10  Alcohol Screen: Low Risk (06/24/2024)   Alcohol Screen    Last Alcohol Screening Score (AUDIT): 0  Housing: Unknown (06/24/2024)   Epic    Unable to Pay for Housing in the Last Year: No    Number of Times Moved in the Last Year: Not on file    Homeless in the Last Year: No  Utilities: Not At Risk (12/24/2023)   Epic    Threatened with loss of utilities: No  Recent Concern: Utilities - At Risk (10/22/2023)   [facility] Utilities    Threatened with loss of utilities: Yes  Health Literacy: Inadequate Health Literacy (06/24/2024)   B1300 Health Literacy    Frequency of need for help with medical instructions: Sometimes    Review of Systems: ROS negative except for what is noted on the assessment and plan.  Objective:   Vitals:   06/24/24 0903  BP: 117/77  Pulse: 79  Temp: 98.3 F (36.8 C)  TempSrc: Oral  SpO2: 98%  Weight: 209 lb 9.6 oz (95.1 kg)  Height: 5' 5 (1.651 m)    Physical Exam: Constitutional: well-appearing woman sitting in chair, in no acute distress HENT: normocephalic atraumatic, mucous membranes moist Eyes: conjunctiva non-erythematous Neck: supple Cardiovascular: regular rate and rhythm, no m/r/g Pulmonary/Chest: normal work of breathing on room air, lungs clear to auscultation bilaterally Abdominal: soft, non-tender, non-distended MSK: normal bulk and tone Neurological: alert & oriented x 3 Skin: warm and dry Psych: Pleasant mood and affect   L sided flank: Non tender, non raised hyperpigmented area on the L flank with signs of excoriations, well healed. Pant's elastic band go over the area as well. Dry, scaling skin throughout.       06/24/2024    9:13 AM  Depression screen PHQ 2/9  Decreased Interest 0  Down, Depressed, Hopeless 0  PHQ - 2 Score 0  Altered sleeping 0  Tired, decreased energy 0  Change in appetite 0  Feeling bad or failure about yourself  0  Trouble concentrating 0  Moving slowly or fidgety/restless 0  Suicidal thoughts 0  PHQ-9 Score 0  Difficult doing work/chores Not difficult at all     Assessment &  Plan:   Restless leg syndrome Continues to be a persistent problem for patient. She stopped taking Ropinirole  about a year ago but does not remember the reason. Did not remember that this medication was for leg discomfort. Sometimes the restlessness is accompanied by cramping. She was seen in the clinic for this last time but was not able to check her electrolytes at that visit. Leg discomfort also altering sleep patters. No much discomfort from peripheral neuropathy like symptoms.  Reviewed previous ferritin and iron levels. Low STOP BANG score. Neg PHQ9 other than sleep onset and maintenance difficulty. Patient willing to trial Ropinirole  again with fast up titration and revisiting in a month to follow up with testing if unsuccessful with repeat iron labs and trial of other medications.   Type 2 diabetes mellitus in patient with obesity (HCC) Progression of the disease on new A1c. Discussed A1c, diabetes diagnosis, and need for screenings in upcoming months. Discussed GLP1 RA medications, but patient would like to think about this after reviewing side effect profile. For now, started on Metformin 500 mg with meals. Will need 3 month follow up.   Eczematous dermatitis Non tender, non raised hyperpigmented area on the L flank with signs of excoriations, well healed. Pant's elastic band go over the area as well. Patient denies superficial or deep pain. Endorses pruritus. Has tried vaseline and shea butter moisturizers. Most bothersome symptom is persistent pruritus. Fortunately, no signs of burrowing or  tracts in the the skin. Suspect eczematous dermatitis vs contact with clothing.   Trial of hydrocortisone  for pruritus for 7 days and then as needed. Discussed cold weather skin preferred wash and barrier protection  Leg cramping Improvement. None currently. Occasionally using cyclobenzaprine  without discernible pattern.   Electrolytes checked during this visit within normal ranges.  - Will continue to monitor  COPD (chronic obstructive pulmonary disease) (HCC) Stable. No dyspnea at rest or with exertion. Has not needed albuterol  inhaler. No limitations in ADL related to respiratory symptoms. No recent exacerbations or hospitalizations for disease  Peripheral polyneuropathy Not currently on medication for this . Does not endorse symptoms.  - Continue to monitor - will need B12 monitoring now that she is on Metformin  Obesity (BMI 30.0-34.9) Continuing to encourage lifestyle modifications. Now with DM progression, educated on GLP-1 RA. Please discuss at follow up    Return in about 4 weeks (around 07/22/2024) for restless leg, insomnia, and rash follow up.  Patient discussed with Dr. Jeanelle Hadassah Kristy Rosario, MD [facility]           [1]  Current Outpatient Medications on File Prior to Visit  Medication Sig Dispense Refill   cetirizine  (ZYRTEC  ALLERGY) 10 MG tablet Take 1 tablet (10 mg total) by mouth daily. 30 tablet 2   cyclobenzaprine  (FLEXERIL ) 10 MG tablet Take 1 tablet (10 mg total) by mouth 3 (three) times daily as needed for muscle spasms. 30 tablet 0   diclofenac  Sodium (VOLTAREN ) 1 % GEL Apply 4 g topically 4 (four) times daily. (Patient not taking: Reported on 12/24/2023) 350 g 1   pantoprazole  (PROTONIX ) 40 MG tablet TAKE 1 TABLET BY MOUTH EVERY DAY(TAKE 30 MINS BEFORE MEALS) 90 tablet 1   rosuvastatin  (CRESTOR ) 10 MG tablet Take 1 tablet (10 mg total) by mouth daily. (Patient not taking: Reported on 12/24/2023) 90 tablet  3   No current facility-administered medications on file prior to visit.   "

## 2024-06-25 ENCOUNTER — Encounter: Payer: Self-pay | Admitting: Student

## 2024-06-25 ENCOUNTER — Other Ambulatory Visit: Payer: Self-pay

## 2024-06-25 DIAGNOSIS — L309 Dermatitis, unspecified: Secondary | ICD-10-CM | POA: Insufficient documentation

## 2024-06-25 LAB — COMPREHENSIVE METABOLIC PANEL WITH GFR
ALT: 27 IU/L (ref 0–32)
AST: 18 IU/L (ref 0–40)
Albumin: 4.4 g/dL (ref 3.9–4.9)
Alkaline Phosphatase: 108 IU/L (ref 49–135)
BUN/Creatinine Ratio: 19 (ref 12–28)
BUN: 16 mg/dL (ref 8–27)
Bilirubin Total: <0.2 mg/dL (ref 0.0–1.2)
CO2: 24 mmol/L (ref 20–29)
Calcium: 9.5 mg/dL (ref 8.7–10.3)
Chloride: 104 mmol/L (ref 96–106)
Creatinine, Ser: 0.84 mg/dL (ref 0.57–1.00)
Globulin, Total: 2.5 g/dL (ref 1.5–4.5)
Glucose: 124 mg/dL — ABNORMAL HIGH (ref 70–99)
Potassium: 4.6 mmol/L (ref 3.5–5.2)
Sodium: 142 mmol/L (ref 134–144)
Total Protein: 6.9 g/dL (ref 6.0–8.5)
eGFR: 79 mL/min/1.73

## 2024-06-25 LAB — HEMOGLOBIN A1C
Est. average glucose Bld gHb Est-mCnc: 148 mg/dL
Hgb A1c MFr Bld: 6.8 % — ABNORMAL HIGH (ref 4.8–5.6)

## 2024-06-25 MED ORDER — METFORMIN HCL ER 500 MG PO TB24
500.0000 mg | ORAL_TABLET | Freq: Every day | ORAL | 11 refills | Status: AC
  Filled 2024-06-25: qty 30, 30d supply, fill #0

## 2024-06-25 NOTE — Assessment & Plan Note (Addendum)
 Continues to be a persistent problem for patient. She stopped taking Ropinirole  about a year ago but does not remember the reason. Did not remember that this medication was for leg discomfort. Sometimes the restlessness is accompanied by cramping. She was seen in the clinic for this last time but was not able to check her electrolytes at that visit. Leg discomfort also altering sleep patters. No much discomfort from peripheral neuropathy like symptoms.  Reviewed previous ferritin and iron levels. Low STOP BANG score. Neg PHQ9 other than sleep onset and maintenance difficulty. Patient willing to trial Ropinirole  again with fast up titration and revisiting in a month to follow up with testing if unsuccessful with repeat iron labs and trial of other medications.

## 2024-06-25 NOTE — Assessment & Plan Note (Signed)
 Non tender, non raised hyperpigmented area on the L flank with signs of excoriations, well healed. Pant's elastic band go over the area as well. Patient denies superficial or deep pain. Endorses pruritus. Has tried vaseline and shea butter moisturizers. Most bothersome symptom is persistent pruritus. Fortunately, no signs of burrowing or tracts in the the skin. Suspect eczematous dermatitis vs contact with clothing.   Trial of hydrocortisone  for pruritus for 7 days and then as needed. Discussed cold weather skin preferred wash and barrier protection

## 2024-06-25 NOTE — Assessment & Plan Note (Signed)
 Not currently on medication for this . Does not endorse symptoms.  - Continue to monitor - will need B12 monitoring now that she is on Metformin 

## 2024-06-25 NOTE — Assessment & Plan Note (Signed)
 Progression of the disease on new A1c. Discussed A1c, diabetes diagnosis, and need for screenings in upcoming months. Discussed GLP1 RA medications, but patient would like to think about this after reviewing side effect profile. For now, started on Metformin  500 mg with meals. Will need 3 month follow up.

## 2024-06-25 NOTE — Assessment & Plan Note (Signed)
 Continuing to encourage lifestyle modifications. Now with DM progression, educated on GLP-1 RA. Please discuss at follow up

## 2024-06-25 NOTE — Assessment & Plan Note (Signed)
 Improvement. None currently. Occasionally using cyclobenzaprine  without discernible pattern.   Electrolytes checked during this visit within normal ranges.  - Will continue to monitor

## 2024-06-25 NOTE — Assessment & Plan Note (Addendum)
 Stable. No dyspnea at rest or with exertion. Has not needed albuterol  inhaler. No limitations in ADL related to respiratory symptoms. No recent exacerbations or hospitalizations for disease

## 2024-06-26 NOTE — Progress Notes (Signed)
 Internal Medicine Clinic Attending  Case discussed with the resident at the time of the visit.  We reviewed the resident's history and exam and pertinent patient test results.  I agree with the assessment, diagnosis, and plan of care documented in the resident's note.

## 2024-07-01 ENCOUNTER — Ambulatory Visit: Admitting: Family Medicine

## 2024-07-01 ENCOUNTER — Encounter: Payer: Self-pay | Admitting: Family Medicine

## 2024-07-01 VITALS — BP 130/68 | Ht 65.0 in | Wt 209.0 lb

## 2024-07-01 DIAGNOSIS — M1711 Unilateral primary osteoarthritis, right knee: Secondary | ICD-10-CM

## 2024-07-01 NOTE — Progress Notes (Signed)
 DATE OF VISIT: 07/01/2024        Theresa Kirk DOB: 03-18-1961 MRN: [id number]  Discussed the use of AI scribe software for clinical note transcription with the patient, who gave verbal consent to proceed.  History of Present Illness Theresa Kirk is a 64 year old female with right knee osteoarthritis who presents for follow-up of right knee pain after recent cortisone injection.  Right Knee Pain and Function: - Received cortisone injection to the right knee on May 20, 2024 for osteoarthritis-related pain - Approximately 80% reduction in right knee pain and improved function over the past six weeks - Pain and discomfort are exacerbated by cold weather, with increased symptoms during colder days - Occasional need for cane for ambulation when pain worsens, particularly in cold conditions; did not require cane for today's visit - Continues to use knee brace for support - Intermittent use of acetaminophen  for pain relief - No regular use of topical medications - Minimal, occasional swelling - Recent transient increase in pain following increased activity at home, which resolved without intervention  Medication Intolerance: - Unable to take NSAIDs due to gastrointestinal intolerance    Medications:  Outpatient Encounter Medications as of 07/01/2024  Medication Sig   albuterol  (VENTOLIN  HFA) 108 (90 Base) MCG/ACT inhaler Inhale 2 puffs into the lungs every 6 (six) hours as needed for wheezing or shortness of breath.   cetirizine  (ZYRTEC  ALLERGY) 10 MG tablet Take 1 tablet (10 mg total) by mouth daily.   cyclobenzaprine  (FLEXERIL ) 10 MG tablet Take 1 tablet (10 mg total) by mouth 3 (three) times daily as needed for muscle spasms.   diclofenac  Sodium (VOLTAREN ) 1 % GEL Apply 4 g topically 4 (four) times daily. (Patient not taking: Reported on 12/24/2023)   hydrocortisone  1 % ointment Apply 1 Application topically 2 (two) times daily for 14 days.   metFORMIN  (GLUCOPHAGE -XR) 500 MG 24 hr  tablet Take 1 tablet (500 mg total) by mouth daily with breakfast.   pantoprazole  (PROTONIX ) 40 MG tablet TAKE 1 TABLET BY MOUTH EVERY DAY(TAKE 30 MINS BEFORE MEALS)   rOPINIRole  (REQUIP ) 0.5 MG tablet Take 1 tablet (0.5 mg total) by mouth at bedtime.   rosuvastatin  (CRESTOR ) 10 MG tablet Take 1 tablet (10 mg total) by mouth daily. (Patient not taking: Reported on 12/24/2023)   No facility-administered encounter medications on file as of 07/01/2024.    Allergies: is allergic to iohexol , ibuprofen , and cephalexin .  Physical Examination: Vitals: BP 130/68   Ht 5' 5 (1.651 m)   Wt 209 lb (94.8 kg)   LMP 09/14/2006   BMI 34.78 kg/m  GENERAL:  Theresa Kirk is a 64 y.o. female appearing their stated age, alert and oriented x 3, in no apparent distress.  SKIN: no rashes or lesions, skin clean, dry, intact MSK: Right knee with trace effusion.  Good range of motion without pain.  No medial or lateral joint line tenderness today.  Walking without a limp. Neurovascular intact distally   Radiology: Right knee x-ray 09/23/2023 showing: - Mild to moderate tricompartmental OA, most prominent in the medial compartment - Moderate effusion  Assessment & Plan Osteoarthritis of the right knee Chronic knee pain with mild to moderate osteoarthritis with 80% improvement post-cortisone injection. Discomfort persists in cold weather, occasional cane use. NSAIDs contraindicated. -Has done well with cortisone injection, but is good candidate for viscosupplementation.  Will initiate prior authorization for viscosupplementation injections. - Educated on possibility of viscosupplementation every six months if needed.  Discussed potential for temporary post-injection  stiffness. - Advised continued acetaminophen  use and knee brace for support. - Recommended caution with ambulation in cold and slick conditions. - Instructed to contact office if symptoms worsen or with questions.   - Follow-up for  viscosupplementation once approved, sooner as needed     Patient expressed understanding & agreement with above.  Encounter Diagnosis  Name Primary?   Arthritis of right knee Yes    No orders of the defined types were placed in this encounter.    Contains text generated by [employer].

## 2024-07-07 ENCOUNTER — Telehealth: Payer: Self-pay | Admitting: Pharmacy Technician

## 2024-07-07 NOTE — Progress Notes (Signed)
" ° °  07/07/2024  Patient ID: Theresa Kirk, female   DOB: April 28, 1961, 64 y.o.   MRN: [id number]  Pharmacy Quality Measure Review  This patient is appearing on a report for being at risk of failing the adherence measure for cholesterol (statin) medications this calendar year.   Medication: Rosuvastatin  Last fill date: 10/28/23 for 90 day supply. Previous fill for 90 on 07/30/23. Stili active on medication list. Patient reported not taking in July 2025. Last lipid profile in Feb 2025 at which time total cholesterol was 231, Triglycerides were 103, HDL was 46 and LDL Chol Calc was 167. In August 2025, patient was advised adherence to daily statin therapy for CV prevention.  Left voicemail for patient to return my call at their convenience.   Alaynna Kerwood, CPhT [facility] Office: [phone] Email: [email or web address]  "

## 2024-12-29 ENCOUNTER — Ambulatory Visit
